# Patient Record
Sex: Female | Born: 1975 | Race: White | Hispanic: No | Marital: Single | Smoking: Never smoker
Health system: Southern US, Community
[De-identification: ages and names within clinical notes are randomized; demographics above are authoritative.]

## PROBLEM LIST (undated history)

## (undated) DIAGNOSIS — H9319 Tinnitus, unspecified ear: Secondary | ICD-10-CM

## (undated) DIAGNOSIS — Z8489 Family history of other specified conditions: Secondary | ICD-10-CM

## (undated) DIAGNOSIS — Z87442 Personal history of urinary calculi: Secondary | ICD-10-CM

## (undated) DIAGNOSIS — J342 Deviated nasal septum: Secondary | ICD-10-CM

## (undated) DIAGNOSIS — J302 Other seasonal allergic rhinitis: Secondary | ICD-10-CM

## (undated) DIAGNOSIS — R42 Dizziness and giddiness: Secondary | ICD-10-CM

## (undated) DIAGNOSIS — R0600 Dyspnea, unspecified: Secondary | ICD-10-CM

## (undated) DIAGNOSIS — M797 Fibromyalgia: Secondary | ICD-10-CM

## (undated) DIAGNOSIS — D649 Anemia, unspecified: Secondary | ICD-10-CM

## (undated) DIAGNOSIS — K219 Gastro-esophageal reflux disease without esophagitis: Secondary | ICD-10-CM

## (undated) DIAGNOSIS — C50912 Malignant neoplasm of unspecified site of left female breast: Secondary | ICD-10-CM

## (undated) HISTORY — DX: Anemia, unspecified: D64.9

## (undated) HISTORY — PX: ADENOIDECTOMY: SUR15

## (undated) MED FILL — KCl 20 MEQ/L (0.15%) in NaCl 0.9% Inj: INTRAVENOUS | Qty: 1000 | Status: AC

---
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
## 1997-10-26 ENCOUNTER — Encounter: Admission: RE | Admit: 1997-10-26 | Discharge: 1997-10-26 | Payer: Self-pay | Admitting: Family Medicine

## 2000-07-04 ENCOUNTER — Encounter: Admission: RE | Admit: 2000-07-04 | Discharge: 2000-07-04 | Payer: Self-pay | Admitting: Family Medicine

## 2001-11-04 ENCOUNTER — Encounter: Admission: RE | Admit: 2001-11-04 | Discharge: 2001-11-04 | Payer: Self-pay | Admitting: *Deleted

## 2001-11-04 ENCOUNTER — Encounter: Admission: RE | Admit: 2001-11-04 | Discharge: 2001-11-04 | Payer: Self-pay | Admitting: Family Medicine

## 2001-11-14 ENCOUNTER — Encounter: Admission: RE | Admit: 2001-11-14 | Discharge: 2001-11-14 | Payer: Self-pay | Admitting: Family Medicine

## 2001-12-13 ENCOUNTER — Encounter: Admission: RE | Admit: 2001-12-13 | Discharge: 2001-12-13 | Payer: Self-pay | Admitting: Family Medicine

## 2002-10-02 ENCOUNTER — Encounter: Payer: Self-pay | Admitting: Obstetrics and Gynecology

## 2002-10-02 ENCOUNTER — Inpatient Hospital Stay (HOSPITAL_COMMUNITY): Admission: AD | Admit: 2002-10-02 | Discharge: 2002-10-02 | Payer: Self-pay | Admitting: Obstetrics and Gynecology

## 2003-02-26 ENCOUNTER — Inpatient Hospital Stay (HOSPITAL_COMMUNITY): Admission: AD | Admit: 2003-02-26 | Discharge: 2003-02-28 | Payer: Self-pay | Admitting: *Deleted

## 2003-03-30 ENCOUNTER — Other Ambulatory Visit: Admission: RE | Admit: 2003-03-30 | Discharge: 2003-03-30 | Payer: Self-pay | Admitting: Obstetrics and Gynecology

## 2003-07-11 HISTORY — PX: TUBAL LIGATION: SHX77

## 2003-10-06 ENCOUNTER — Inpatient Hospital Stay (HOSPITAL_COMMUNITY): Admission: AD | Admit: 2003-10-06 | Discharge: 2003-10-06 | Payer: Self-pay | Admitting: *Deleted

## 2003-11-02 ENCOUNTER — Other Ambulatory Visit: Admission: RE | Admit: 2003-11-02 | Discharge: 2003-11-02 | Payer: Self-pay | Admitting: Obstetrics and Gynecology

## 2003-11-03 ENCOUNTER — Other Ambulatory Visit: Admission: RE | Admit: 2003-11-03 | Discharge: 2003-11-03 | Payer: Self-pay | Admitting: Obstetrics and Gynecology

## 2004-03-14 ENCOUNTER — Inpatient Hospital Stay (HOSPITAL_COMMUNITY): Admission: AD | Admit: 2004-03-14 | Discharge: 2004-03-17 | Payer: Self-pay | Admitting: Obstetrics and Gynecology

## 2004-05-02 ENCOUNTER — Ambulatory Visit (HOSPITAL_COMMUNITY): Admission: RE | Admit: 2004-05-02 | Discharge: 2004-05-02 | Payer: Self-pay | Admitting: Obstetrics and Gynecology

## 2006-01-12 ENCOUNTER — Emergency Department (HOSPITAL_COMMUNITY): Admission: EM | Admit: 2006-01-12 | Discharge: 2006-01-12 | Payer: Self-pay | Admitting: Emergency Medicine

## 2007-12-11 ENCOUNTER — Emergency Department (HOSPITAL_COMMUNITY): Admission: EM | Admit: 2007-12-11 | Discharge: 2007-12-11 | Payer: Self-pay | Admitting: Emergency Medicine

## 2008-12-11 ENCOUNTER — Emergency Department (HOSPITAL_COMMUNITY): Admission: EM | Admit: 2008-12-11 | Discharge: 2008-12-11 | Payer: Self-pay | Admitting: Family Medicine

## 2008-12-11 ENCOUNTER — Emergency Department (HOSPITAL_COMMUNITY): Admission: EM | Admit: 2008-12-11 | Discharge: 2008-12-12 | Payer: Self-pay | Admitting: Emergency Medicine

## 2010-01-03 ENCOUNTER — Emergency Department (HOSPITAL_COMMUNITY): Admission: EM | Admit: 2010-01-03 | Discharge: 2010-01-03 | Payer: Self-pay | Admitting: Family Medicine

## 2010-09-04 ENCOUNTER — Emergency Department (HOSPITAL_COMMUNITY)
Admission: EM | Admit: 2010-09-04 | Discharge: 2010-09-04 | Disposition: A | Discharge status: Home / Self Care | Payer: 59 | Attending: Emergency Medicine | Admitting: Emergency Medicine

## 2010-09-04 DIAGNOSIS — G43909 Migraine, unspecified, not intractable, without status migrainosus: Secondary | ICD-10-CM | POA: Insufficient documentation

## 2010-09-25 LAB — URINALYSIS, MICROSCOPIC ONLY
Glucose, UA: NEGATIVE mg/dL
Protein, ur: >300 mg/dL — AB
pH: 5.5 (ref 5.0–8.0)

## 2010-09-25 LAB — POCT URINALYSIS DIP (DEVICE)
Nitrite: POSITIVE — AB
Protein, ur: >=300 mg/dL — AB
Urobilinogen, UA: 1 mg/dL (ref 0.0–1.0)
pH: 5.5 (ref 5.0–8.0)

## 2010-11-25 NOTE — Op Note (Signed)
NAMEAILY, TZENG             ACCOUNT NO.:  [id number]   MEDICAL RECORD NO.:  [id number]          PATIENT TYPE:  AMB   LOCATION:  SDC                           FACILITY:  [facility]   PHYSICIAN:  Randye Lobo, M.D.   DATE OF BIRTH:  [birth year]   DATE OF PROCEDURE:  05/02/2004  DATE OF DISCHARGE:                                 OPERATIVE REPORT   PREOPERATIVE DIAGNOSES:  1.  Multiparous female.  2.  Desires permanent sterilization.   POSTOPERATIVE DIAGNOSES:  1.  Multiparous female.  2.  Desires permanent sterilization.   PROCEDURE:  Laparoscopic  bilateral tubal ligation with bipolar cautery.   SURGEON:  Randye Lobo, M.D.   ANESTHESIA:  General endotracheal, local with 0.25% Marcaine.   FLUIDS REPLACED:  1500 mL Ringer's lactate.   ESTIMATED BLOOD LOSS:  Minimal.   URINE OUTPUT:  300 mL.   COMPLICATIONS:  None.   INDICATION FOR PROCEDURE:  The patient is a 35 year old gravida 3, para 64,  Caucasian female, status post normal spontaneous vaginal delivery on  March 15, 2004, who throughout her antenatal course and postpartum course  expressed a desire for permanent sterilization.  The patient was counseled  regarding risks, benefits, and alternatives to permanent sterilization, and  she chose to proceed.  The patient was counseled regarding a failure rate of  the procedure of approximately one in 250 to one in 300, which may result in  either an intrauterine or an ectopic pregnancy.  She again chose to proceed.   FINDINGS:  Laparoscopy demonstrated a normal uterus, tubes, and ovaries.  The left ovary was noted to be slightly enlarged compared to the right,  although there was no obvious cyst formation.   SPECIMENS:  None.   PROCEDURE:  The patient was reidentified in the preoperative hold area.  She  did receive Clindamycin intravenously for antibiotic prophylaxis.  In the  operating room, the patient was placed in the supine position and general  endotracheal  anesthesia was induced.  She was then placed in the dorsal  lithotomy position.  The abdomen and the vagina were then sterilely prepped  and the patient was catheterized of urine.  A speculum was placed in the  vagina and a single-tooth tenaculum was placed on the anterior cervical lip,  and this was replaced by the Hulka tenaculum.  The remaining vaginal  instruments were removed.   A 1 cm vertical umbilical incision was created with the scalpel.  The  incision was carried down to the fascia with blunt dissection using an Allis  clamp.  A 10 mm trocar was then inserted directly into the peritoneal cavity  without difficulty.  The laparoscope confirmed proper placement.  A  pneumoperitoneum was achieved with CO2 gas.   The laparoscope was then placed and the patient was placed in the  Trendelenburg position.  The area underneath the trocar insertion site was  examined and there was no evidence of any bleeding in this area.  There was  some bleeding noted in the pelvis, which was consistent with the patient's  current menstruation.  An inspection of  the pelvic organs was performed, and  the findings are as noted above.  In the upper abdomen, the liver and  stomach appeared to be unremarkable.   A 5 mm incision was then created suprapubically with a scalpel.  A 5 mm  trocar was placed under direct visualization of the laparoscope.  The right  fallopian tube was grasped with a Kleppinger forceps and it was followed all  the way to its fimbriated end.  It was then coagulated in an isthmic portion  along a 2 cm contiguous section of fallopian tube.  The same procedure that  was followed on the right fallopian tube was then repeated on the left  fallopian tube after it was grasped and followed all the way to its  fimbriated end.  Again there was good blanching noted at both the fallopian  tube and the mesosalpinx.   The suprapubic trocar was removed under visualization of the laparoscope.   The pneumoperitoneum was released and the laparoscope and the umbilical  trocar were removed simultaneously.  The skin incisions were closed with  subcuticular sutures of 3-0 plain.  Steri-Strips and Benzoin were placed  over the incisions, and the skin was then injected with 0.25% Marcaine  locally.   All of the vaginal instruments and the Foley catheter were removed.  The  patient was cleansed of any remaining Betadine.  There were no complications  to the procedure.  All needle, instrument, and lap counts were correct.   The patient was escorted to the recovery room in stable and awake condition.     Broo   BES/MEDQ  D:  05/02/2004  T:  05/02/2004  Job:  [id number]

## 2011-05-13 ENCOUNTER — Inpatient Hospital Stay (INDEPENDENT_AMBULATORY_CARE_PROVIDER_SITE_OTHER)
Admission: RE | Admit: 2011-05-13 | Discharge: 2011-05-13 | Disposition: A | Discharge status: Home / Self Care | Payer: 59 | Source: Ambulatory Visit | Attending: Emergency Medicine | Admitting: Emergency Medicine

## 2011-05-13 DIAGNOSIS — M25519 Pain in unspecified shoulder: Secondary | ICD-10-CM

## 2011-08-17 ENCOUNTER — Emergency Department (INDEPENDENT_AMBULATORY_CARE_PROVIDER_SITE_OTHER)
Admission: EM | Admit: 2011-08-17 | Discharge: 2011-08-17 | Disposition: A | Discharge status: Home / Self Care | Payer: Self-pay | Source: Home / Self Care | Attending: Family Medicine | Admitting: Family Medicine

## 2011-08-17 ENCOUNTER — Encounter (HOSPITAL_COMMUNITY): Payer: Self-pay | Admitting: *Deleted

## 2011-08-17 DIAGNOSIS — J069 Acute upper respiratory infection, unspecified: Secondary | ICD-10-CM

## 2011-08-17 MED ORDER — GUAIFENESIN-CODEINE 100-10 MG/5ML PO SYRP: ORAL_SOLUTION | ORAL | Status: AC

## 2011-08-17 MED ORDER — AZITHROMYCIN 250 MG PO TABS: 250.0000 mg | ORAL_TABLET | Freq: Every day | ORAL | Status: AC

## 2011-08-17 NOTE — ED Provider Notes (Signed)
History     CSN: [id number]  Arrival date & time 08/17/11  1044   First MD Initiated Contact with Patient 08/17/11 1231      Chief Complaint  Patient presents with  . Cough    (Consider location/radiation/quality/duration/timing/severity/associated sxs/prior treatment) HPI Comments: Cough and congestion x 3 days. Fever yesterday to 104. Cough is dry and causing her chest to hurt. No runny nose. Some sore throat. No ear pain, n/v. Took tylenol yesterday. No fever today.   The history is provided by the patient.    History reviewed. No pertinent past medical history.  History reviewed. No pertinent past surgical history.  Family History  Problem Relation Age of Onset  . Asthma Mother   . Cancer Other     History  Substance Use Topics  . Smoking status: Not on file  . Smokeless tobacco: Not on file  . Alcohol Use:     OB History    Grav Para Term Preterm Abortions TAB SAB Ect Mult Living                  Review of Systems  Constitutional: Positive for fever.  HENT: Positive for congestion and sore throat.   Respiratory: Positive for cough. Negative for wheezing.   Cardiovascular: Negative.   Gastrointestinal: Negative.   Genitourinary: Negative.   Musculoskeletal: Positive for arthralgias.  Skin: Negative.     Allergies  Penicillins  Home Medications   Current Outpatient Rx  Name Route Sig Dispense Refill  . AZITHROMYCIN 250 MG PO TABS Oral Take 1 tablet (250 mg total) by mouth daily. Take first 2 tablets together, then 1 every day until finished. 6 tablet 0  . GUAIFENESIN-CODEINE 100-10 MG/5ML PO SYRP  1-2 tsp po q 6 hrs prn cough 120 mL 0    BP 119/63  Pulse 70  Temp(Src) 98.6 F (37 C) (Oral)  Resp 20  SpO2 99%  LMP 08/17/2011  Physical Exam  Nursing note and vitals reviewed. Constitutional: She appears well-developed and well-nourished. No distress.  HENT:  Head: Normocephalic and atraumatic.       Ears clear, nose as congested, throat  mild erythemic  Neck: Normal range of motion. Neck supple.  Cardiovascular: Normal rate, regular rhythm and normal heart sounds.   Pulmonary/Chest: Effort normal and breath sounds normal. She has no wheezes.       Congested cough  Lymphadenopathy:    She has no cervical adenopathy.  Skin: Skin is warm and dry.    ED Course  Procedures (including critical care time)  Labs Reviewed - No data to display No results found.   1. URI (upper respiratory infection)       MDM          Randa Spike, MD 08/17/11 1324

## 2011-08-17 NOTE — ED Notes (Signed)
Pt  Has  Symptoms  Of  Cough  /  Congested       With  Pain in  Chest  When  She  Coughs     -    The  Cough  For  The  Most  Part is  Non  -  Productive      With  Streaks  Of  Bloody  Mucous         She  Is  Speaking in  Complete  sentances  And  Is  In no  Severe  Distress

## 2011-11-04 ENCOUNTER — Encounter (HOSPITAL_COMMUNITY): Payer: Self-pay | Admitting: Physical Medicine and Rehabilitation

## 2011-11-04 ENCOUNTER — Emergency Department (HOSPITAL_COMMUNITY)
Admission: EM | Admit: 2011-11-04 | Discharge: 2011-11-05 | Disposition: A | Discharge status: Home / Self Care | Payer: 59 | Attending: Emergency Medicine | Admitting: Emergency Medicine

## 2011-11-04 DIAGNOSIS — R1013 Epigastric pain: Secondary | ICD-10-CM | POA: Insufficient documentation

## 2011-11-04 DIAGNOSIS — K219 Gastro-esophageal reflux disease without esophagitis: Secondary | ICD-10-CM | POA: Insufficient documentation

## 2011-11-04 DIAGNOSIS — R1110 Vomiting, unspecified: Secondary | ICD-10-CM

## 2011-11-04 DIAGNOSIS — Z79899 Other long term (current) drug therapy: Secondary | ICD-10-CM | POA: Insufficient documentation

## 2011-11-04 DIAGNOSIS — R112 Nausea with vomiting, unspecified: Secondary | ICD-10-CM | POA: Insufficient documentation

## 2011-11-04 DIAGNOSIS — R12 Heartburn: Secondary | ICD-10-CM | POA: Insufficient documentation

## 2011-11-04 HISTORY — DX: Gastro-esophageal reflux disease without esophagitis: K21.9

## 2011-11-04 LAB — COMPREHENSIVE METABOLIC PANEL
BUN: 8 mg/dL (ref 6–23)
CO2: 24 mEq/L (ref 19–32)
Calcium: 9.6 mg/dL (ref 8.4–10.5)
Chloride: 102 mEq/L (ref 96–112)
Creatinine, Ser: 0.81 mg/dL (ref 0.50–1.10)
GFR calc Af Amer: >90 mL/min (ref >90)
GFR calc non Af Amer: >90 mL/min (ref >90)
Glucose, Bld: 86 mg/dL (ref 70–99)
Total Bilirubin: 0.6 mg/dL (ref 0.3–1.2)

## 2011-11-04 LAB — URINALYSIS, ROUTINE W REFLEX MICROSCOPIC
Bilirubin Urine: NEGATIVE
Glucose, UA: NEGATIVE mg/dL
Hgb urine dipstick: NEGATIVE
Specific Gravity, Urine: 1.02 (ref 1.005–1.030)
Urobilinogen, UA: 0.2 mg/dL (ref 0.0–1.0)

## 2011-11-04 LAB — CBC
HCT: 39.7 % (ref 36.0–46.0)
Hemoglobin: 14.6 g/dL (ref 12.0–15.0)
MCV: 86.5 fL (ref 78.0–100.0)
RDW: 12.2 % (ref 11.5–15.5)
WBC: 6.7 10*3/uL (ref 4.0–10.5)

## 2011-11-04 LAB — DIFFERENTIAL
Basophils Absolute: 0 10*3/uL (ref 0.0–0.1)
Eosinophils Relative: 4 % (ref 0–5)
Lymphocytes Relative: 28 % (ref 12–46)
Lymphs Abs: 1.9 10*3/uL (ref 0.7–4.0)
Monocytes Absolute: 0.4 10*3/uL (ref 0.1–1.0)
Monocytes Relative: 6 % (ref 3–12)

## 2011-11-04 LAB — LIPASE, BLOOD: Lipase: 25 U/L (ref 11–59)

## 2011-11-04 LAB — URINE MICROSCOPIC-ADD ON

## 2011-11-04 MED ORDER — SODIUM CHLORIDE 0.9 % IV BOLUS (SEPSIS)
1000.0000 mL | Freq: Once | INTRAVENOUS | Status: AC
  Administered 2011-11-04: 1000 mL via INTRAVENOUS

## 2011-11-04 MED ORDER — FAMOTIDINE IN NACL 20-0.9 MG/50ML-% IV SOLN
20.0000 mg | Freq: Once | INTRAVENOUS | Status: AC
  Administered 2011-11-04: 20 mg via INTRAVENOUS
  Filled 2011-11-04: qty 50

## 2011-11-04 MED ORDER — ONDANSETRON HCL 4 MG/2ML IJ SOLN
4.0000 mg | Freq: Once | INTRAMUSCULAR | Status: AC
  Administered 2011-11-04: 4 mg via INTRAVENOUS
  Filled 2011-11-04: qty 2

## 2011-11-04 MED ORDER — GI COCKTAIL ~~LOC~~
30.0000 mL | Freq: Once | ORAL | Status: AC
  Administered 2011-11-04: 30 mL via ORAL
  Filled 2011-11-04: qty 30

## 2011-11-04 MED ORDER — ONDANSETRON 4 MG PO TBDP
4.0000 mg | ORAL_TABLET | Freq: Once | ORAL | Status: AC
  Administered 2011-11-04: 4 mg via ORAL
  Filled 2011-11-04: qty 1

## 2011-11-04 NOTE — ED Provider Notes (Signed)
History     CSN: [id number]  Arrival date & time 11/04/11  4098   First MD Initiated Contact with Patient 11/04/11 1904      Chief Complaint  Patient presents with  . Abdominal Pain  . Gastrophageal Reflux    (Consider location/radiation/quality/duration/timing/severity/associated sxs/prior treatment) Patient is a 36 y.o. female presenting with abdominal pain and vomiting. The history is provided by the patient.  Abdominal Pain The primary symptoms of the illness include abdominal pain, nausea and vomiting. The primary symptoms of the illness do not include fatigue, shortness of breath, diarrhea, dysuria or vaginal discharge. The onset of the illness was sudden. The problem has not changed since onset. The abdominal pain began 6 to 12 hours ago. The abdominal pain has been unchanged since its onset. The abdominal pain is located in the epigastric region. The abdominal pain radiates to the RUQ and LLQ. The abdominal pain is relieved by nothing. The abdominal pain is exacerbated by eating and certain positions.  Additional symptoms associated with the illness include heartburn. Symptoms associated with the illness do not include urgency or frequency. Significant associated medical issues include GERD.  Emesis  This is a new problem. The current episode started 6 to 12 hours ago. The problem occurs 5 to 10 times per day. The problem has not changed since onset.The emesis has an appearance of stomach contents. There has been no fever. Associated symptoms include abdominal pain. Pertinent negatives include no cough, no diarrhea and no headaches.    Past Medical History  Diagnosis Date  . GERD (gastroesophageal reflux disease)   . Migraine     History reviewed. No pertinent past surgical history.  Family History  Problem Relation Age of Onset  . Asthma Mother   . Cancer Other     History  Substance Use Topics  . Smoking status: Never Smoker   . Smokeless tobacco: Not on file  .  Alcohol Use: No    OB History    Grav Para Term Preterm Abortions TAB SAB Ect Mult Living                  Review of Systems  Constitutional: Negative for fatigue.  HENT: Negative for neck pain.   Respiratory: Negative for cough and shortness of breath.   Cardiovascular: Negative for chest pain.  Gastrointestinal: Positive for heartburn, nausea, vomiting and abdominal pain. Negative for diarrhea.  Genitourinary: Negative for dysuria, urgency, frequency, vaginal discharge and pelvic pain.  Skin: Negative for rash.  Neurological: Negative for headaches.  All other systems reviewed and are negative.    Allergies  Penicillins  Home Medications   Current Outpatient Rx  Name Route Sig Dispense Refill  . ASPIRIN-ACETAMINOPHEN-CAFFEINE 250-250-65 MG PO TABS Oral Take 2 tablets by mouth daily as needed. For headaches    . OMEPRAZOLE MAGNESIUM 20 MG PO TBEC Oral Take 20 mg by mouth daily.      BP 133/87  Pulse 92  Temp(Src) 98.5 F (36.9 C) (Oral)  Resp 14  SpO2 100%  Physical Exam  Nursing note and vitals reviewed. Constitutional: She is oriented to person, place, and time. She appears well-developed and well-nourished.  HENT:  Head: Normocephalic and atraumatic.  Eyes: EOM are normal.  Neck: Normal range of motion.  Cardiovascular: Normal rate, regular rhythm and normal heart sounds.   Pulmonary/Chest: Effort normal and breath sounds normal. No respiratory distress.  Abdominal: Soft. There is tenderness in the epigastric area. There is no rigidity, no rebound, no  guarding, no CVA tenderness and negative Murphy's sign.  Musculoskeletal: Normal range of motion.  Neurological: She is alert and oriented to person, place, and time.  Skin: Skin is warm and dry.  Psychiatric: She has a normal mood and affect.    ED Course  Procedures (including critical care time)  Labs Reviewed  URINALYSIS, ROUTINE W REFLEX MICROSCOPIC - Abnormal; Notable for the following:    Ketones,  ur 15 (*)    Leukocytes, UA MODERATE (*)    All other components within normal limits  CBC - Abnormal; Notable for the following:    MCHC 36.8 (*)    All other components within normal limits  POCT PREGNANCY, URINE  DIFFERENTIAL  COMPREHENSIVE METABOLIC PANEL  LIPASE, BLOOD  URINE MICROSCOPIC-ADD ON   No results found.   1. Epigastric pain   2. Vomiting       MDM  Patient with extensive history of reflux who presents with epigastric pain and vomiting today. She states she's seen her PCP for this before his recommendations she was started yogurt today when she had onset of epigastric pain and vomiting. Since then she has had inability to tolerate any thing by mouth. She has also had numerous episodes of vomiting that consist only of stomach acid. No fevers. No blood in her emesis.  On evaluation here the patient has epigastric tenderness. She also has some less prominent right upper quadrant tenderness without Murphy sign. We'll plan on screening labs and symptomatic treatment.  Screening labs all benign.The patient had a normal white count. Also completely normal CMP. Lipase within normal limits. Repeat abdominal exam remained benign. She had no complaints of pain. However on repeat PO challenges the patient vomited. Given lack of pain, benign exam, the lack of abdominal distention do not feel as though consistent with obstruction. Will obtain abdominal ultrasound to further evaluate. Leukocytes noted in urine but patient without urinary symptoms.  Care transferred with abdominal ultrasound pending.       Donnamarie Poag, MD 11/05/11 [phone]

## 2011-11-04 NOTE — ED Notes (Signed)
Pt presents to department for evaluation of abdominal pain, vomiting and acid reflux. Onset today. Pt states severe history of GERD. States numerous episodes of vomiting "bile" today. States difficulty holding down food and fluids. 6/10 abdominal "burning" sensation at the time. Also states "I feel like something is stuck in my throat." she is alert and oriented x4. No signs of acute distress at the time.

## 2011-11-05 ENCOUNTER — Emergency Department (HOSPITAL_COMMUNITY): Payer: 59

## 2011-11-05 MED ORDER — GI COCKTAIL ~~LOC~~
30.0000 mL | Freq: Once | ORAL | Status: AC
  Administered 2011-11-05: 30 mL via ORAL
  Filled 2011-11-05: qty 30

## 2011-11-05 NOTE — Discharge Instructions (Signed)
Abdominal (belly) pain can be caused by many things. any cases can be observed and treated at home after initial evaluation in the emergency department. Even though you are being discharged home, abdominal pain can be unpredictable. Therefore, you need a repeated exam if your pain does not resolve, returns, or worsens. Most patients with abdominal pain don't have to be admitted to the hospital or have surgery, but serious problems like appendicitis and gallbladder attacks can start out as nonspecific pain. Many abdominal conditions cannot be diagnosed in one visit, so follow-up evaluations are very important. SEEK IMMEDIATE MEDICAL ATTENTION IF: The pain does not go away or becomes severe, particularly over the next 8-12 hours.  A temperature above 100.65F develops.  Repeated vomiting occurs (multiple episodes).  The pain becomes localized to portions of the abdomen. The right side could possibly be appendicitis. In an adult, the left lower portion of the abdomen could be colitis or diverticulitis.  Blood is being passed in stools or vomit (bright red or black tarry stools).  Return also if you develop chest pain, difficulty breathing, dizziness or fainting, or become confused, poorly responsive.   AS WE DISCUSSED BE SURE TO FOLLOWUP WITH YOUR PRIMARY FOR THE CALCIUM ON YOUR KIDNEYS WE SPOKE ABOUT AND NEED FOR REPEAT BLOOD WORK TO BE DRAWN TO CHECK YOUR CALCIUM AND KIDNEY FUNCTION

## 2011-11-05 NOTE — ED Provider Notes (Signed)
PT IMPROVED WE DISCUSSED US FINDINGS AND NEED FOR F/U ON CALCIUM/RENAL FUNCTION, THOUGH THESE LEVELS ARE NORMAL TODAY PT TAKING PO STABLE FOR D/C BP 124/76  Pulse 80  Temp(Src) 99.5 F (37.5 C) (Oral)  Resp 17  Ht 5\' 8"  (1.727 m)  Wt 178 lb (80.74 kg)  BMI 27.06 kg/m2  SpO2 99%   Joya Gaskins, MD 11/05/11 [phone]

## 2011-11-06 NOTE — ED Provider Notes (Signed)
I saw and evaluated the patient, reviewed the resident's note and I agree with the findings and plan.  abd pain, nausea and vomiting. Care to Dr Bebe Shaggy, pending [place]  Lyanne Co, MD 11/06/11 [phone]

## 2011-11-07 ENCOUNTER — Encounter: Payer: Self-pay | Admitting: Gastroenterology

## 2011-11-20 ENCOUNTER — Encounter: Payer: Self-pay | Admitting: *Deleted

## 2011-11-24 ENCOUNTER — Ambulatory Visit: Payer: 59 | Admitting: Gastroenterology

## 2011-12-07 ENCOUNTER — Encounter: Payer: Self-pay | Admitting: Gastroenterology

## 2011-12-07 ENCOUNTER — Ambulatory Visit (HOSPITAL_COMMUNITY)
Admission: RE | Admit: 2011-12-07 | Discharge: 2011-12-07 | Disposition: A | Discharge status: Home / Self Care | Payer: 59 | Source: Ambulatory Visit | Attending: Gastroenterology | Admitting: Gastroenterology

## 2011-12-07 ENCOUNTER — Other Ambulatory Visit: Payer: Self-pay | Admitting: Gastroenterology

## 2011-12-07 ENCOUNTER — Ambulatory Visit (INDEPENDENT_AMBULATORY_CARE_PROVIDER_SITE_OTHER): Payer: 59 | Admitting: Gastroenterology

## 2011-12-07 DIAGNOSIS — R6889 Other general symptoms and signs: Secondary | ICD-10-CM | POA: Insufficient documentation

## 2011-12-07 DIAGNOSIS — K920 Hematemesis: Secondary | ICD-10-CM

## 2011-12-07 DIAGNOSIS — R634 Abnormal weight loss: Secondary | ICD-10-CM

## 2011-12-07 DIAGNOSIS — K219 Gastro-esophageal reflux disease without esophagitis: Secondary | ICD-10-CM | POA: Insufficient documentation

## 2011-12-07 DIAGNOSIS — R1310 Dysphagia, unspecified: Secondary | ICD-10-CM

## 2011-12-07 DIAGNOSIS — K449 Diaphragmatic hernia without obstruction or gangrene: Secondary | ICD-10-CM | POA: Insufficient documentation

## 2011-12-07 DIAGNOSIS — R1110 Vomiting, unspecified: Secondary | ICD-10-CM | POA: Insufficient documentation

## 2011-12-07 MED ORDER — AMBULATORY NON FORMULARY MEDICATION: Status: DC

## 2011-12-07 NOTE — Progress Notes (Addendum)
History of Present Illness:  This is a pleasant and intelligent 36 year old Caucasian female who has 3 years of progressive dysphagia and painful swallowing for both solids and liquids, with an associated 20 pound weight loss. She apparently had endoscopy 3 years ago at [facility] which was unremarkable, we have requested this report for review. She is normal right ear PPI agents without any improvement, but does have sour regurgitation of both solids and liquids. There is no history of caustic fluid ingestion as a child, tetracycline or other chronic antibiotic use, or other neuromuscular or collagen vascular symptomatology. She specifically denies Raynaud's phenomenon or bladder emptying problems. There is no history of alcohol, cigarette, or NSAID abuse. Family history is noncontributory. She does have a mild chronic iron deficiency anemia associated with menstruation. Currently she is on Prilosec 20 mg a day. She does complain of intermittent minor hematemesis. There is no history of hepatitis, pancreatitis, or known liver disease. She has had a previous tattoo.  I have reviewed this patient's present history, medical and surgical past history, allergies and medications.     ROS: The remainder of the 10 point ROS is negative     Physical Exam:  blood pressure 100/70, pulse 80 and regular, and weight 191 pounds with a BMI of 30.01. General well developed well nourished patient in no acute distress, appearing her stated age Eyes PERRLA, no icterus, fundoscopic exam per opthamologist Skin no lesions noted, examination oral pharyngeal area is entirely normal. Neck supple, no adenopathy, no thyroid enlargement, no tenderness Chest clear to percussion and auscultation Heart no significant murmurs, gallops or rubs noted Abdomen no hepatosplenomegaly masses or tenderness, BS normal.  Extremities no acute joint lesions, edema, phlebitis or evidence of cellulitis. Neurologic patient oriented x 3, cranial  nerves intact, no focal neurologic deficits noted. Psychological mental status normal and normal affect.  Assessment and plan: Her symptomatology seems more consistent with a neuromuscular esophageal disease such as achalasia. I have ordered barium swallow exam and we'll proceed accordingly. I suspect she will need endoscopy and esophageal manometry. In the interim, she is to continue Prilosec with when necessary GI cocktail.  Encounter Diagnosis  Name Primary?  Marland Kitchen Dysphagia Yes

## 2011-12-07 NOTE — Patient Instructions (Signed)
Your Barium Swallow is scheduled for today 12/07/2011 please go to [facility] at 10:15am. Your prescription(s) have been sent to you pharmacy.

## 2011-12-08 ENCOUNTER — Ambulatory Visit (AMBULATORY_SURGERY_CENTER): Payer: 59 | Admitting: Gastroenterology

## 2011-12-08 ENCOUNTER — Other Ambulatory Visit: Payer: Self-pay | Admitting: Gastroenterology

## 2011-12-08 ENCOUNTER — Encounter: Payer: Self-pay | Admitting: Gastroenterology

## 2011-12-08 VITALS — BP 126/85 | HR 71 | Temp 95.8°F | Resp 16 | Ht 67.0 in | Wt 191.0 lb

## 2011-12-08 DIAGNOSIS — R1310 Dysphagia, unspecified: Secondary | ICD-10-CM

## 2011-12-08 DIAGNOSIS — K210 Gastro-esophageal reflux disease with esophagitis: Secondary | ICD-10-CM

## 2011-12-08 DIAGNOSIS — K200 Eosinophilic esophagitis: Secondary | ICD-10-CM

## 2011-12-08 MED ORDER — FLUTICASONE PROPIONATE HFA 110 MCG/ACT IN AERO: INHALATION_SPRAY | RESPIRATORY_TRACT | Status: DC

## 2011-12-08 MED ORDER — SODIUM CHLORIDE 0.9 % IV SOLN: 500.0000 mL | INTRAVENOUS | Status: DC

## 2011-12-08 NOTE — Patient Instructions (Addendum)
YOU HAD AN ENDOSCOPIC PROCEDURE TODAY AT THE [facility]: Refer to the procedure report that was given to you for any specific questions about what was found during the examination.  If the procedure report does not answer your questions, please call your gastroenterologist to clarify.  If you requested that your care partner not be given the details of your procedure findings, then the procedure report has been included in a sealed envelope for you to review at your convenience later.  YOU SHOULD EXPECT: Some feelings of bloating in the abdomen. Passage of more gas than usual.  Walking can help get rid of the air that was put into your GI tract during the procedure and reduce the bloating. If you had a lower endoscopy (such as a colonoscopy or flexible sigmoidoscopy) you may notice spotting of blood in your stool or on the toilet paper. If you underwent a bowel prep for your procedure, then you may not have a normal bowel movement for a few days.  DIET: Your first meal following the procedure should be a light meal and then it is ok to progress to your normal diet.  A half-sandwich or bowl of soup is an example of a good first meal.  Heavy or fried foods are harder to digest and may make you feel nauseous or bloated.  Likewise meals heavy in dairy and vegetables can cause extra gas to form and this can also increase the bloating.  Drink plenty of fluids but you should avoid alcoholic beverages for 24 hours.  ACTIVITY: Your care partner should take you home directly after the procedure.  You should plan to take it easy, moving slowly for the rest of the day.  You can resume normal activity the day after the procedure however you should NOT DRIVE or use heavy machinery for 24 hours (because of the sedation medicines used during the test).    SYMPTOMS TO REPORT IMMEDIATELY: A gastroenterologist can be reached at any hour.  During normal business hours, 8:30 AM to 5:00 PM Monday through Friday,  call [phone].  After hours and on weekends, please call the GI answering service at [phone] who will take a message and have the physician on call contact you.  Following upper endoscopy (EGD)  Vomiting of blood or coffee ground material  New chest pain or pain under the shoulder blades  Painful or persistently difficult swallowing  New shortness of breath  Fever of 100F or higher  Black, tarry-looking stools  FOLLOW UP: If any biopsies were taken you will be contacted by phone or by letter within the next 1-3 weeks.  Call your gastroenterologist if you have not heard about the biopsies in 3 weeks.  Our staff will call the home number listed on your records the next business day following your procedure to check on you and address any questions or concerns that you may have at that time regarding the information given to you following your procedure. This is a courtesy call and so if there is no answer at the home number and we have not heard from you through the emergency physician on call, we will assume that you have returned to your regular daily activities without incident.  SIGNATURES/CONFIDENTIALITY: You and/or your care partner have signed paperwork which will be entered into your electronic medical record.  These signatures attest to the fact that that the information above on your After Visit Summary has been reviewed and is understood.  Full responsibility of   the confidentiality of this discharge information lies with you and/or your care-partner. 

## 2011-12-08 NOTE — Op Note (Signed)
[facility] [address] [place], [place]  [address]  ENDOSCOPY PROCEDURE REPORT  PATIENT:  [name], [name]  MR#:  [id number] BIRTHDATE:  12-25-1975, 36 yrs. old  GENDER:  female  ENDOSCOPIST:  Vania Rea. Jarold Motto, MD, [facility] Referred by:  PROCEDURE DATE:  12/08/2011 PROCEDURE:  EGD with biopsy, 43239, EGD with biopsy for H. 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  MEDICATIONS:   propofol (Diprivan) 300 mg IV TOPICAL ANESTHETIC:  DESCRIPTION OF PROCEDURE:   After the risks and benefits of the procedure were explained, informed consent was obtained.  The LB GIF-H180 [id number] endoscope was introduced through the mouth and advanced to the second portion of the duodenum.  The instrument was slowly withdrawn as the mucosa was fully examined. <<PROCEDUREIMAGES>>  Mild gastritis was found in the body and the antrum of the stomach. EROSIVE GASTRITIS.CLO BX. DONE  The esophagus and gastroesophageal junction were completely normal in appearance. ESOPHAGEAL BIOPSIES DONE.THEN DILATED WITH #52F MALONEY DILATOR.NO HEME OR PAIN  Normal duodenal folds were noted.    Retroflexed views revealed no abnormalities.    The scope was then withdrawn from the patient and the procedure completed.  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  RECOMMENDATIONS: 1) Await biopsy results 2) Clear liquids until, then soft foods rest iof day. Resume prior diet tomorrow. FLOVENT PO WITHOUUT NEBULIZER FOR 6 WEEKS,SEE ME 3 WEEKS.  ______________________________ Vania Rea. Jarold Motto, MD, [facility]  CC:  n. eSIGNED:   Vania Rea. Maleigha Colvard at 12/08/2011 03:50 PM  Sid Falcon, [phone]

## 2011-12-08 NOTE — Progress Notes (Signed)
Patient did not experience any of the following events: a burn prior to discharge; a fall within the facility; wrong site/side/patient/procedure/implant event; or a hospital transfer or hospital admission upon discharge from the facility. (G8907) Patient did not have preoperative order for IV antibiotic SSI prophylaxis. (G8918)  

## 2011-12-11 ENCOUNTER — Telehealth: Payer: Self-pay

## 2011-12-11 NOTE — Telephone Encounter (Signed)
Left message on answering machine. 

## 2011-12-15 ENCOUNTER — Encounter: Payer: Self-pay | Admitting: Gastroenterology

## 2012-07-21 ENCOUNTER — Encounter (HOSPITAL_COMMUNITY): Payer: Self-pay | Admitting: *Deleted

## 2012-07-21 ENCOUNTER — Emergency Department (HOSPITAL_COMMUNITY)
Admission: EM | Admit: 2012-07-21 | Discharge: 2012-07-21 | Discharge status: Against Medical Advice | Payer: Self-pay | Attending: Emergency Medicine | Admitting: Emergency Medicine

## 2012-07-21 ENCOUNTER — Emergency Department (HOSPITAL_COMMUNITY): Payer: Self-pay

## 2012-07-21 DIAGNOSIS — R112 Nausea with vomiting, unspecified: Secondary | ICD-10-CM | POA: Insufficient documentation

## 2012-07-21 DIAGNOSIS — R209 Unspecified disturbances of skin sensation: Secondary | ICD-10-CM | POA: Insufficient documentation

## 2012-07-21 DIAGNOSIS — H53149 Visual discomfort, unspecified: Secondary | ICD-10-CM | POA: Insufficient documentation

## 2012-07-21 DIAGNOSIS — G43909 Migraine, unspecified, not intractable, without status migrainosus: Secondary | ICD-10-CM | POA: Insufficient documentation

## 2012-07-21 MED ORDER — SODIUM CHLORIDE 0.9 % IV SOLN
INTRAVENOUS | Status: DC
  Administered 2012-07-21: 14:00:00 via INTRAVENOUS

## 2012-07-21 MED ORDER — KETOROLAC TROMETHAMINE 30 MG/ML IJ SOLN
30.0000 mg | Freq: Once | INTRAMUSCULAR | Status: AC
  Administered 2012-07-21: 30 mg via INTRAVENOUS
  Filled 2012-07-21: qty 1

## 2012-07-21 MED ORDER — METOCLOPRAMIDE HCL 5 MG/ML IJ SOLN
10.0000 mg | Freq: Once | INTRAMUSCULAR | Status: AC
  Administered 2012-07-21: 10 mg via INTRAVENOUS
  Filled 2012-07-21: qty 2

## 2012-07-21 MED ORDER — DIPHENHYDRAMINE HCL 50 MG/ML IJ SOLN
25.0000 mg | Freq: Once | INTRAMUSCULAR | Status: AC
  Administered 2012-07-21: 25 mg via INTRAVENOUS
  Filled 2012-07-21: qty 1

## 2012-07-21 NOTE — ED Provider Notes (Signed)
Medical screening examination/treatment/procedure(s) were conducted as a shared visit with non-physician practitioner(s) and myself.  I personally evaluated the patient during the encounter  Sudden onset headache that is worse than usual migraines with nausea and photophobia.  Endorses thunderclap. No neuro deficits, no meningismus.  CT neg. Refuses LP.  Understands SAH not ruled out and will leave against medical advice.  Glynn Octave, MD 07/21/12 [phone]

## 2012-07-21 NOTE — ED Notes (Signed)
Reports hx of migraines, started having one today with nausea, vomiting, photosensitivity and tingling sensation to entire face. No distress noted at triage.

## 2012-07-21 NOTE — ED Provider Notes (Signed)
SUBJECTIVE:  [name] [name] is a 37 y.o. female who complains of migraine headache for the past several hours.  The patient states that she has a history of migraine headaches, but that this one came on faster than usual.  The pain has been constant since her symptoms began earlier this afternoon. Description of pain: throbbing pain. Associated symptoms: nausea, and photophobia. Patient has already taken nothing for this headache without relief.  Current Facility-Administered Medications  Medication Dose Route Frequency Provider Last Rate Last Dose  . 0.9 %  sodium chloride infusion   Intravenous Continuous Roxy Horseman, PA-C       Current Outpatient Prescriptions  Medication Sig Dispense Refill  . ibuprofen (ADVIL,MOTRIN) 200 MG tablet Take 400 mg by mouth every 6 (six) hours as needed. For pain.      . fluticasone (FLOVENT HFA) 110 MCG/ACT inhaler Swallow 4 puffs three times a day for 6 weeks, NO SPACER, do not eat or drink 30 mins after  3 Inhaler  3    There are no associated abnormal neurological symptoms such as TIA's, loss of balance, loss of vision or speech, numbness or weakness on review. Past neurological history: negative for stroke, MS, epilepsy, or brain tumor.   Review of Systems  All other systems reviewed and are negative.   Physical Exam  Nursing note and vitals reviewed. Constitutional: She is oriented to person, place, and time and well-developed, well-nourished, and in no distress. No distress.  HENT:  Head: Normocephalic and atraumatic.  Eyes: Conjunctivae normal and EOM are normal. Pupils are equal, round, and reactive to light.  Neck: Normal range of motion. Neck supple.  Cardiovascular: Normal rate.  Exam reveals no gallop and no friction rub.   No murmur heard. Pulmonary/Chest: Effort normal and breath sounds normal. No respiratory distress. She has no wheezes. She has no rales. She exhibits no tenderness.  Abdominal: Soft.  Musculoskeletal: Normal  range of motion. She exhibits no edema and no tenderness.  Neurological: She is alert and oriented to person, place, and time. GCS score is 15.       CN 3-12 intact, sensation and strength intact bilaterally.  Skin: Skin is warm. She is not diaphoretic.  Psychiatric: Mood, memory, affect and judgment normal.   OBJECTIVE:  Patient appears in pain, preferring to lie in a darkened room. Her vitals are normal. Alert and oriented x 3.  Ears and throat normal. Neck fully supple without nodes. Sinuses non tender. Cranial nerves 3-12 are normal.  Fundi are normal with sharp disc margins, no papilledema, hemorrhages or exudates noted. Mental status normal. Cerebellar function normal.   ASSESSMENT:  Migraine headache  PLAN:  Patient feeling much better after having received IV fluids and migraine cocktail.  She states that she is 90% better.  Dr. Manus Gunning has seen this patient personally.  Discussed with the patient that the sudden onset of her headache is somewhat concerning, and that we would not be able to completely rule out a SAH unless we performed a lumbar puncture.  The patient focalizing understanding, but refuses to have the LP.  She will be discharged AMA, but in stable condition.  She is neurovascularly intact at discharge.  She understands and agrees with the plan.  Return precautions have been given.    Roxy Horseman, PA-C 07/21/12 1755

## 2012-07-21 NOTE — ED Notes (Signed)
Pt refused lumbar puncture

## 2012-10-17 ENCOUNTER — Encounter (HOSPITAL_COMMUNITY): Payer: Self-pay | Admitting: Emergency Medicine

## 2012-10-17 ENCOUNTER — Emergency Department (HOSPITAL_COMMUNITY)
Admission: EM | Admit: 2012-10-17 | Discharge: 2012-10-17 | Disposition: A | Discharge status: Home / Self Care | Payer: Self-pay | Attending: Emergency Medicine | Admitting: Emergency Medicine

## 2012-10-17 DIAGNOSIS — Z862 Personal history of diseases of the blood and blood-forming organs and certain disorders involving the immune mechanism: Secondary | ICD-10-CM | POA: Insufficient documentation

## 2012-10-17 DIAGNOSIS — R1110 Vomiting, unspecified: Secondary | ICD-10-CM | POA: Insufficient documentation

## 2012-10-17 DIAGNOSIS — J029 Acute pharyngitis, unspecified: Secondary | ICD-10-CM | POA: Insufficient documentation

## 2012-10-17 DIAGNOSIS — R509 Fever, unspecified: Secondary | ICD-10-CM | POA: Insufficient documentation

## 2012-10-17 DIAGNOSIS — R6889 Other general symptoms and signs: Secondary | ICD-10-CM | POA: Insufficient documentation

## 2012-10-17 DIAGNOSIS — H53149 Visual discomfort, unspecified: Secondary | ICD-10-CM | POA: Insufficient documentation

## 2012-10-17 DIAGNOSIS — J3489 Other specified disorders of nose and nasal sinuses: Secondary | ICD-10-CM | POA: Insufficient documentation

## 2012-10-17 DIAGNOSIS — R112 Nausea with vomiting, unspecified: Secondary | ICD-10-CM | POA: Insufficient documentation

## 2012-10-17 DIAGNOSIS — Z8719 Personal history of other diseases of the digestive system: Secondary | ICD-10-CM | POA: Insufficient documentation

## 2012-10-17 DIAGNOSIS — G43909 Migraine, unspecified, not intractable, without status migrainosus: Secondary | ICD-10-CM | POA: Insufficient documentation

## 2012-10-17 DIAGNOSIS — Z87442 Personal history of urinary calculi: Secondary | ICD-10-CM | POA: Insufficient documentation

## 2012-10-17 MED ORDER — KETOROLAC TROMETHAMINE 30 MG/ML IJ SOLN
30.0000 mg | Freq: Once | INTRAMUSCULAR | Status: AC
  Administered 2012-10-17: 30 mg via INTRAVENOUS
  Filled 2012-10-17: qty 1

## 2012-10-17 MED ORDER — DIPHENHYDRAMINE HCL 25 MG PO CAPS: 25.0000 mg | ORAL_CAPSULE | Freq: Four times a day (QID) | ORAL | Status: DC | PRN

## 2012-10-17 MED ORDER — PROCHLORPERAZINE EDISYLATE 5 MG/ML IJ SOLN
10.0000 mg | Freq: Four times a day (QID) | INTRAMUSCULAR | Status: DC | PRN
  Administered 2012-10-17: 10 mg via INTRAVENOUS
  Filled 2012-10-17: qty 2

## 2012-10-17 MED ORDER — PROCHLORPERAZINE MALEATE 10 MG PO TABS: 10.0000 mg | ORAL_TABLET | Freq: Two times a day (BID) | ORAL | Status: DC | PRN

## 2012-10-17 MED ORDER — DIPHENHYDRAMINE HCL 50 MG/ML IJ SOLN
12.5000 mg | Freq: Once | INTRAMUSCULAR | Status: AC
  Administered 2012-10-17: 12.5 mg via INTRAVENOUS
  Filled 2012-10-17: qty 1

## 2012-10-17 MED ORDER — SODIUM CHLORIDE 0.9 % IV BOLUS (SEPSIS)
1000.0000 mL | Freq: Once | INTRAVENOUS | Status: AC
  Administered 2012-10-17: 1000 mL via INTRAVENOUS

## 2012-10-17 MED ORDER — HALOPERIDOL LACTATE 5 MG/ML IJ SOLN
2.0000 mg | Freq: Once | INTRAMUSCULAR | Status: AC
  Administered 2012-10-17: 2 mg via INTRAVENOUS
  Filled 2012-10-17: qty 1

## 2012-10-17 NOTE — ED Provider Notes (Signed)
History     CSN: [id number]  Arrival date & time 10/17/12  0420   First MD Initiated Contact with Patient 10/17/12 0431      Chief Complaint  Patient presents with  . Migraine    (Consider location/radiation/quality/duration/timing/severity/associated sxs/prior treatment) HPI [name] [name] is a 37 y.o. female there is a history of migraine headaches was seen previously in January for a similar migraine headaches presents with a migraine headache that started today at 5:00 while getting a new cell phone. Patient says she does not have insurance and so can't afford prophylactic or abortive medications. Headache came on gradually, it peaked at about 8:00, she was not doing anything exertional while the headache was increasing, and about 8:00 started vomiting. She has photophobia, phonophobia, nausea and vomiting. She denies any numbness, tingling, dysarthria, difficulties walking or problems with balance. No changes in vision, chest pain, shortness of breath, productive cough, abdominal pain, diarrhea, blood in the vomit, blood in the stool, dysuria or frequency. A recent fevers chills, cough, runny nose sore throat, she has had some congestion in her nose when she lays down at night.   Past Medical History  Diagnosis Date  . GERD (gastroesophageal reflux disease)   . Migraine   . Anemia   . Kidney stones   . Eosinophilic esophagitis 06/10/2010    egd dr Madilyn Fireman    Past Surgical History  Procedure Laterality Date  . Tubal ligation  2005  . Tonsillectomy  1991    Family History  Problem Relation Age of Onset  . Asthma Mother   . Esophageal cancer Maternal Grandfather   . Ovarian cancer Maternal Grandmother   . Diabetes Mother   . Diabetes Maternal Grandmother   . Colon polyps Mother     History  Substance Use Topics  . Smoking status: Never Smoker   . Smokeless tobacco: Never Used  . Alcohol Use: No    OB History   Grav Para Term Preterm Abortions TAB SAB Ect Mult  Living                  Review of Systems At least 10pt or greater review of systems completed and are negative except where specified in the HPI.  Allergies  Hydrocodone and Penicillins  Home Medications  No current outpatient prescriptions on file.  BP 132/80  Pulse 87  Temp(Src) 98.1 F (36.7 C) (Oral)  Resp 20  SpO2 100%  Physical Exam  Nursing notes reviewed.  Electronic medical record reviewed. VITAL SIGNS:   Filed Vitals:   10/17/12 0422  BP: 132/80  Pulse: 87  Temp: 98.1 F (36.7 C)  TempSrc: Oral  Resp: 20  SpO2: 100%   CONSTITUTIONAL: Awake, oriented, appears non-toxic HENT: Atraumatic, normocephalic, oral mucosa pink and moist, airway patent. Nares patent without drainage. External ears normal. EYES: Conjunctiva clear, EOMI, PERRLA NECK: Trachea midline, non-tender, supple CARDIOVASCULAR: Normal heart rate, Normal rhythm, No murmurs, rubs, gallops PULMONARY/CHEST: Clear to auscultation, no rhonchi, wheezes, or rales. Symmetrical breath sounds. Non-tender. ABDOMINAL: Non-distended, soft, non-tender - no rebound or guarding.  BS normal. NEUROLOGIC: Cranial nerves II through XII are normal the. Moving all four extremities, no gross sensory or motor deficits.  EXTREMITIES: No clubbing, cyanosis, or edema SKIN: Warm, Dry, No erythema, No rash  ED Course  Procedures (including critical care time)  Labs Reviewed - No data to display No results found.   1. Migraine headache       MDM  Cleon Dew Helman  is a 36 y.o. female presenting with migraine headache that is reminiscent of prior migraines, it is more intense, however he did progress over 3 hours and is not exertional. Do not think she's got a subarachnoid hemorrhage. She is nontoxic and afebrile, no stiff neck, do not think this is meningitis. We'll treat the patient with migraine cocktail and reassess.  10/17/2012 6:15 AM: Patient feels much better and likes to go home. Headache pain is only  minimal. We'll send patient home with Compazine - side effects explained, instructed to take Benadryl with Compazine to avoid akathisia or dystonia.  I explained the diagnosis and have given explicit precautions to return to the ER including any other new or worsening symptoms. The patient understands and accepts the medical plan as it's been dictated and I have answered their questions. Discharge instructions concerning home care and prescriptions have been given.  The patient is STABLE and is discharged to home in good condition.             Jones Skene, MD 10/17/12 1610

## 2012-10-17 NOTE — ED Notes (Signed)
Pt presents to ED with c/o severe headache with nausea and vomiting, onset at 5pm yesterday; pt states that she has "migraine headache".

## 2013-04-08 ENCOUNTER — Emergency Department (HOSPITAL_COMMUNITY)
Admission: EM | Admit: 2013-04-08 | Discharge: 2013-04-08 | Disposition: A | Discharge status: Home / Self Care | Payer: BC Managed Care – PPO | Attending: Emergency Medicine | Admitting: Emergency Medicine

## 2013-04-08 ENCOUNTER — Encounter (HOSPITAL_COMMUNITY): Payer: Self-pay | Admitting: Emergency Medicine

## 2013-04-08 DIAGNOSIS — Z3202 Encounter for pregnancy test, result negative: Secondary | ICD-10-CM | POA: Insufficient documentation

## 2013-04-08 DIAGNOSIS — Z87442 Personal history of urinary calculi: Secondary | ICD-10-CM | POA: Insufficient documentation

## 2013-04-08 DIAGNOSIS — Z880 Allergy status to penicillin: Secondary | ICD-10-CM | POA: Insufficient documentation

## 2013-04-08 DIAGNOSIS — Z8719 Personal history of other diseases of the digestive system: Secondary | ICD-10-CM | POA: Insufficient documentation

## 2013-04-08 DIAGNOSIS — R112 Nausea with vomiting, unspecified: Secondary | ICD-10-CM | POA: Insufficient documentation

## 2013-04-08 DIAGNOSIS — Z862 Personal history of diseases of the blood and blood-forming organs and certain disorders involving the immune mechanism: Secondary | ICD-10-CM | POA: Insufficient documentation

## 2013-04-08 DIAGNOSIS — G43909 Migraine, unspecified, not intractable, without status migrainosus: Secondary | ICD-10-CM

## 2013-04-08 MED ORDER — KETOROLAC TROMETHAMINE 30 MG/ML IJ SOLN
INTRAMUSCULAR | Status: AC
  Administered 2013-04-08: 30 mg via INTRAVENOUS
  Filled 2013-04-08: qty 1

## 2013-04-08 MED ORDER — KETOROLAC TROMETHAMINE 30 MG/ML IJ SOLN
30.0000 mg | Freq: Once | INTRAMUSCULAR | Status: AC
  Administered 2013-04-08: 30 mg via INTRAVENOUS

## 2013-04-08 MED ORDER — MAGNESIUM SULFATE IN D5W 10-5 MG/ML-% IV SOLN
1.0000 g | Freq: Once | INTRAVENOUS | Status: AC
  Administered 2013-04-08: 1 g via INTRAVENOUS
  Filled 2013-04-08: qty 100

## 2013-04-08 MED ORDER — SODIUM CHLORIDE 0.9 % IV BOLUS (SEPSIS)
1000.0000 mL | Freq: Once | INTRAVENOUS | Status: AC
  Administered 2013-04-08: 1000 mL via INTRAVENOUS

## 2013-04-08 MED ORDER — METOCLOPRAMIDE HCL 5 MG/ML IJ SOLN
5.0000 mg | Freq: Once | INTRAMUSCULAR | Status: AC
  Administered 2013-04-08: 5 mg via INTRAVENOUS

## 2013-04-08 MED ORDER — DIPHENHYDRAMINE HCL 50 MG/ML IJ SOLN
INTRAMUSCULAR | Status: AC
  Filled 2013-04-08: qty 1

## 2013-04-08 MED ORDER — PROCHLORPERAZINE MALEATE 10 MG PO TABS: 10.0000 mg | ORAL_TABLET | Freq: Two times a day (BID) | ORAL | Status: DC | PRN

## 2013-04-08 MED ORDER — DIPHENHYDRAMINE HCL 50 MG/ML IJ SOLN
25.0000 mg | Freq: Once | INTRAMUSCULAR | Status: AC
  Administered 2013-04-08: 25 mg via INTRAVENOUS

## 2013-04-08 MED ORDER — METOCLOPRAMIDE HCL 5 MG/ML IJ SOLN
INTRAMUSCULAR | Status: AC
  Administered 2013-04-08: 5 mg via INTRAVENOUS
  Filled 2013-04-08: qty 2

## 2013-04-08 NOTE — ED Provider Notes (Signed)
CSN: [id number]     Arrival date & time 04/08/13  1658 History   First MD Initiated Contact with Patient 04/08/13 1702     Chief Complaint  Patient presents with  . Migraine   (Consider location/radiation/quality/duration/timing/severity/associated sxs/prior Treatment) HPI  This is a 37 year old female who presents with migraine. She has fairly frequent migraines. She endorses frontal headache, right-sided facial pain, and tingling. Patient states onset of migraine today. She's had vomiting and photosensitivity. She sometimes gets this with her migraines. She denies any focal weakness or numbness. She denies any double vision or blurry vision. She denies any fevers or neck stiffness.  Past Medical History  Diagnosis Date  . GERD (gastroesophageal reflux disease)   . Migraine   . Anemia   . Kidney stones   . Eosinophilic esophagitis 06/10/2010    egd dr Madilyn Fireman   Past Surgical History  Procedure Laterality Date  . Tubal ligation  2005  . Tonsillectomy  1991   Family History  Problem Relation Age of Onset  . Asthma Mother   . Esophageal cancer Maternal Grandfather   . Ovarian cancer Maternal Grandmother   . Diabetes Mother   . Diabetes Maternal Grandmother   . Colon polyps Mother    History  Substance Use Topics  . Smoking status: Never Smoker   . Smokeless tobacco: Never Used  . Alcohol Use: No   OB History   Grav Para Term Preterm Abortions TAB SAB Ect Mult Living                 Review of Systems  Constitutional: Negative for fever.  HENT: Negative for neck pain and neck stiffness.   Respiratory: Negative for cough, chest tightness and shortness of breath.   Cardiovascular: Negative for chest pain.  Gastrointestinal: Positive for nausea and vomiting. Negative for abdominal pain.  Genitourinary: Negative for dysuria.  Musculoskeletal: Negative for back pain.  Skin: Negative for wound.  Neurological: Positive for headaches. Negative for dizziness.   Psychiatric/Behavioral: Negative for confusion.  All other systems reviewed and are negative.    Allergies  Hydrocodone and Penicillins  Home Medications   Current Outpatient Rx  Name  Route  Sig  Dispense  Refill  . prochlorperazine (COMPAZINE) 10 MG tablet   Oral   Take 1 tablet (10 mg total) by mouth 2 (two) times daily as needed.   10 tablet   0    BP 115/71  Pulse 75  Temp(Src) 98.1 F (36.7 C) (Oral)  Resp 14  SpO2 96%  LMP 03/28/2013 Physical Exam  Nursing note and vitals reviewed. Constitutional: She is oriented to person, place, and time. She appears well-developed and well-nourished. No distress.  HENT:  Head: Normocephalic and atraumatic.  Eyes: Pupils are equal, round, and reactive to light.  Neck: Neck supple.  Cardiovascular: Normal rate, regular rhythm and normal heart sounds.   Pulmonary/Chest: Effort normal and breath sounds normal. No respiratory distress. She has no wheezes.  Abdominal: Soft. There is no tenderness.  Neurological: She is alert and oriented to person, place, and time.  Cranial nerves II through XII intact, no dysmetria noted on finger-nose-finger, visual fields intact, equal and symmetric strength  Skin: Skin is warm and dry.  Psychiatric: She has a normal mood and affect.    ED Course  Procedures (including critical care time) Labs Review Labs Reviewed  POCT PREGNANCY, URINE   Imaging Review No results found.  MDM   1. Migraine    This a 37 year old  female who presents with a migraine. She is nontoxic-appearing on exam and her vital signs are within normal limits. She is nonfocal on neurologic exam. Patient was given normal saline bolus, Reglan, and Benadryl. Patient continued to report pain. Patient was subsequently given Toradol and magnesium. Patient had complete resolution of her pain. Patient states that she was given Compazine during her last discharge which helped her for her migraines. Patient will be given a short  course of Compazine. She was encouraged to take Benadryl with the Compazine to prevent side effects.  After history, exam, and medical workup I feel the patient has been appropriately medically screened and is safe for discharge home. Pertinent diagnoses were discussed with the patient. Patient was given return precautions.  Shon Baton, MD 04/09/13 [phone]

## 2013-04-08 NOTE — ED Notes (Signed)
Discharge instructions reviewed with patient and female visitor  Rx x 1 reviewed with patient Follow up instructions reviewed with patient  Patient and female visitor verbalized understanding to DC instructions, Rx and follow up care All questions answered  Patient and female visitor deny further needs or concerns at this time Patient alert and oriented x 4 upon time of DC and in NAD

## 2013-04-08 NOTE — ED Notes (Signed)
Entered patient room to proceed with D/C process Patient and female visitor asking "Did they give me something for the pain to take home?" No Rx with D/C instructions Patient stated "The last time I was here, they gave me something that really helped." Patient states that she does not remember name of medication MD made aware of above

## 2013-04-08 NOTE — ED Notes (Signed)
Pt c/o migraine x 5 hours along with n/v. Has vomited 2 twice.

## 2013-04-08 NOTE — ED Notes (Signed)
Patient noted to be up for D/C Marchelle Folks, ED Tech at bedside to update VS and remove PIV site

## 2013-11-06 ENCOUNTER — Encounter: Payer: Self-pay | Admitting: Gastroenterology

## 2013-11-17 ENCOUNTER — Encounter: Payer: Self-pay | Admitting: Physician Assistant

## 2013-11-17 ENCOUNTER — Telehealth: Payer: Self-pay | Admitting: Gastroenterology

## 2013-11-17 ENCOUNTER — Encounter (HOSPITAL_COMMUNITY)
Admission: RE | Disposition: A | Discharge status: Home / Self Care | Payer: Self-pay | Source: Ambulatory Visit | Attending: Gastroenterology

## 2013-11-17 ENCOUNTER — Ambulatory Visit (INDEPENDENT_AMBULATORY_CARE_PROVIDER_SITE_OTHER): Payer: BC Managed Care – PPO | Admitting: Physician Assistant

## 2013-11-17 ENCOUNTER — Telehealth: Payer: Self-pay

## 2013-11-17 ENCOUNTER — Encounter (HOSPITAL_COMMUNITY): Payer: Self-pay | Admitting: *Deleted

## 2013-11-17 ENCOUNTER — Ambulatory Visit (HOSPITAL_COMMUNITY)
Admission: RE | Admit: 2013-11-17 | Discharge: 2013-11-17 | Disposition: A | Discharge status: Home / Self Care | Payer: BC Managed Care – PPO | Source: Ambulatory Visit | Attending: Gastroenterology | Admitting: Gastroenterology

## 2013-11-17 VITALS — BP 112/76 | HR 68 | Ht 68.0 in | Wt 193.8 lb

## 2013-11-17 DIAGNOSIS — Z880 Allergy status to penicillin: Secondary | ICD-10-CM | POA: Insufficient documentation

## 2013-11-17 DIAGNOSIS — R1310 Dysphagia, unspecified: Secondary | ICD-10-CM

## 2013-11-17 DIAGNOSIS — Z8669 Personal history of other diseases of the nervous system and sense organs: Secondary | ICD-10-CM

## 2013-11-17 DIAGNOSIS — Y929 Unspecified place or not applicable: Secondary | ICD-10-CM | POA: Insufficient documentation

## 2013-11-17 DIAGNOSIS — T18108A Unspecified foreign body in esophagus causing other injury, initial encounter: Secondary | ICD-10-CM | POA: Insufficient documentation

## 2013-11-17 DIAGNOSIS — IMO0002 Reserved for concepts with insufficient information to code with codable children: Secondary | ICD-10-CM | POA: Insufficient documentation

## 2013-11-17 DIAGNOSIS — K209 Esophagitis, unspecified without bleeding: Secondary | ICD-10-CM | POA: Insufficient documentation

## 2013-11-17 DIAGNOSIS — Z885 Allergy status to narcotic agent status: Secondary | ICD-10-CM | POA: Insufficient documentation

## 2013-11-17 DIAGNOSIS — R1314 Dysphagia, pharyngoesophageal phase: Secondary | ICD-10-CM

## 2013-11-17 DIAGNOSIS — Z800 Family history of malignant neoplasm of digestive organs: Secondary | ICD-10-CM | POA: Insufficient documentation

## 2013-11-17 DIAGNOSIS — G43909 Migraine, unspecified, not intractable, without status migrainosus: Secondary | ICD-10-CM | POA: Insufficient documentation

## 2013-11-17 DIAGNOSIS — K219 Gastro-esophageal reflux disease without esophagitis: Secondary | ICD-10-CM

## 2013-11-17 DIAGNOSIS — T18128A Food in esophagus causing other injury, initial encounter: Secondary | ICD-10-CM

## 2013-11-17 DIAGNOSIS — N201 Calculus of ureter: Secondary | ICD-10-CM | POA: Insufficient documentation

## 2013-11-17 DIAGNOSIS — K222 Esophageal obstruction: Secondary | ICD-10-CM

## 2013-11-17 HISTORY — PX: ESOPHAGOGASTRODUODENOSCOPY: SHX5428

## 2013-11-17 SURGERY — EGD (ESOPHAGOGASTRODUODENOSCOPY)
Anesthesia: Moderate Sedation

## 2013-11-17 MED ORDER — MIDAZOLAM HCL 10 MG/2ML IJ SOLN
INTRAMUSCULAR | Status: DC | PRN
  Administered 2013-11-17 (×4): 2 mg via INTRAVENOUS

## 2013-11-17 MED ORDER — SODIUM CHLORIDE 0.9 % IV SOLN
INTRAVENOUS | Status: DC
  Administered 2013-11-17: 15:00:00 via INTRAVENOUS

## 2013-11-17 MED ORDER — FENTANYL CITRATE 0.05 MG/ML IJ SOLN
INTRAMUSCULAR | Status: DC | PRN
  Administered 2013-11-17 (×3): 25 ug via INTRAVENOUS

## 2013-11-17 MED ORDER — MIDAZOLAM HCL 5 MG/ML IJ SOLN
INTRAMUSCULAR | Status: AC
  Filled 2013-11-17: qty 2

## 2013-11-17 MED ORDER — DIPHENHYDRAMINE HCL 50 MG/ML IJ SOLN
INTRAMUSCULAR | Status: AC
  Filled 2013-11-17: qty 1

## 2013-11-17 MED ORDER — FENTANYL CITRATE 0.05 MG/ML IJ SOLN
INTRAMUSCULAR | Status: AC
  Filled 2013-11-17: qty 2

## 2013-11-17 MED ORDER — DEXLANSOPRAZOLE 60 MG PO CPDR: 60.0000 mg | DELAYED_RELEASE_CAPSULE | Freq: Every day | ORAL | Status: DC

## 2013-11-17 NOTE — Telephone Encounter (Signed)
Pt states that over the weekend she was having difficulty swallowing and now it feels like she has some food stuck. States she is "vomiting acid." Pt scheduled to see Nicoletta Ba PA today at 2pm. Pt aware of appt.

## 2013-11-17 NOTE — Discharge Instructions (Signed)

## 2013-11-17 NOTE — Patient Instructions (Signed)
Please go to [facility] Admitting for an Endoscopy now. Dexilant samples given as well as prescription.

## 2013-11-17 NOTE — H&P (View-Only) (Signed)
Subjective:    Patient ID: [name] [name], female    DOB: 03/08/1976, 38 y.o.   MRN: [id number]  HPI  [name] [name]  is a pleasant 38 year old white female previously known to Dr. Sharlett Iles. She has history of GERD and had undergone EGD in May of 2013 for complaints of dysphagia and odynophagia with suspicion for eosinophilic esophagitis. She was found to have an antral gastritis and distal esophagitis normal-appearing esophageal mucosa. She had empiric Maloney dilation done to #50. Biopsies were consistent with a reflux esophagitis and negative for eosinophilic esophagitis. She had some chronically inflamed squamous mucosa. She says she was treated with Dexilant  for a period of time and then eventually switched to omeprazole by her PCP which she says has not been as helpful. She says she has had some symptoms off and on ever since 2013 but has had an increase in sour brash, subxiphoid discomfort and a pressure type feeling under her ribs over the past several weeks. She complains of a burning discomfort in her esophagus and says her esophagus feels irritated. She did just take a Z-Pak, she has not been on any steroids or any NSAIDs. Yesterday he had an acute exacerbation of her symptoms after eating Mothers Day dinner. She says she had eaten salad,pasta  and then had a small piece of steak which she feels became lodged in her esophagus. She says she has had discomfort in that same spot since then and has been unable to eat or drink anything. She says she was up all night uncomfortable and spitting intermittently into a trash can. She is able to swallow her saliva, but says she cannot get any water to go down. He says when she tries to drink something at gurgles back up and she has to spit it out.    Review of Systems  Constitutional: Negative.   HENT: Positive for trouble swallowing.   Eyes: Negative.   Respiratory: Negative.   Cardiovascular: Positive for chest pain.  Gastrointestinal: Negative.     Endocrine: Negative.   Genitourinary: Negative.   Musculoskeletal: Negative.   Skin: Negative.   Allergic/Immunologic: Negative.   Neurological: Negative.   Hematological: Negative.   Psychiatric/Behavioral: Negative.    Outpatient Prescriptions Prior to Visit  Medication Sig Dispense Refill  . prochlorperazine (COMPAZINE) 10 MG tablet Take 1 tablet (10 mg total) by mouth 2 (two) times daily as needed.  10 tablet  0   No facility-administered medications prior to visit.       Allergies  Allergen Reactions  . Hydrocodone Other (See Comments)    Unknown reaction  . Penicillins Rash   Patient Active Problem List   Diagnosis Date Noted  . Hx of migraine headaches 11/17/2013  . Ureterolithiasis 11/17/2013  . GERD (gastroesophageal reflux disease) 11/17/2013   History  Substance Use Topics  . Smoking status: Never Smoker   . Smokeless tobacco: Never Used  . Alcohol Use: No   family history includes Asthma in her mother; Colon polyps in her mother; Diabetes in her maternal grandmother and mother; Esophageal cancer in her maternal grandfather; Ovarian cancer in her maternal grandmother.      Physical Exam And and and and a well-developed white female in no acute distress, blood pressure 112/76 pulse 68 height 5 foot 8 weight 193. HEENT; nontraumatic normocephalic EOMI PERRLA sclera anicteric, oropharynx is clear there is no definite oral thrush, Neck;Supple no JVD, Cardiovascular; regular rate and rhythm with S1-S2 no murmur or gallop, Pulmonary; clear bilaterally, Abdomen;  soft mildly tender in the epigastrium there is no guarding or rebound no palpable mass or hepatosplenomegaly bowel sounds are present, Rectal; exam not done, Extremities; no clubbing cyanosis or edema skin warm and dry, Psych; mood and affect appropriate       Assessment & Plan:  #3  38 year old female with history of chronic GERD and prior history of esophagitis with complaints of dysphagia and   odynophagia refractory to omeprazole now presenting with an acute episode of dysphagia and possible food impaction with steak which was consumed for dinner last evening.  #2 history of migraines #3 ureterolithiasis  Plan; have discussed with Dr. Ardis Hughs who is covering the hospital and patient will undergo EGD later this afternoon per Dr. Ardis Hughs given possibility of food impaction. Have given her Dexilant samples 60 mg by mouth every morning and also sent a prescription for Dexilant . Further plans pending results of her EGD.

## 2013-11-17 NOTE — Telephone Encounter (Signed)
Message copied by Barron Alvine on Mon Nov 17, 2013  4:05 PM ------      Message from: Owens Loffler P      Created: Mon Nov 17, 2013  3:56 PM       She needs EGD at [facility] with balloon dilation in about 2 weeks, going home today from food impaction EGD.  thanks ------

## 2013-11-17 NOTE — Op Note (Signed)
[facility] [place], [id number]   ENDOSCOPY PROCEDURE REPORT  PATIENT: [name], [name]  MR#: [id number] BIRTHDATE: 1975-08-16 , 38  yrs. old GENDER: Female ENDOSCOPIST: Milus Banister, MD PROCEDURE DATE:  11/17/2013 PROCEDURE:  EGD w/ fb removal ASA CLASS:     Class II INDICATIONS:  chronic dysphagia, GERD; ? food impaction since yeterday. MEDICATIONS: Fentanyl 75 mcg IV and Versed 6 mg IV TOPICAL ANESTHETIC: none  DESCRIPTION OF PROCEDURE: After the risks benefits and alternatives of the procedure were thoroughly explained, informed consent was obtained.  The PENTAX GASTOROSCOPE [id number] endoscope was introduced through the mouth and advanced to the second portion of the duodenum. Without limitations.  The instrument was slowly withdrawn as the mucosa was fully examined.    There was a medium sized food bolus lodge at GE junction.  This was gently pushed into the stomach without significant resistence. There was underlying inflamed, friable Schatzki's ring vs focal peptic stricture.  There was minor oozing at the site after bolus removal and so the stricture was not dilated during this exam.  The examination was otherwise normal.  Retroflexed views revealed no abnormalities.     The scope was then withdrawn from the patient and the procedure completed. COMPLICATIONS: There were no complications.  ENDOSCOPIC IMPRESSION: There was a medium sized food bolus lodge at GE junction.  This was gently pushed into the stomach without significant resistence. There was underlying inflamed, friable Schatzki's ring vs focal peptic stricture.  There was minor oozing at the site after bolus removal and so the stricture was not dilated during this exam.  The examination was otherwise normal.  RECOMMENDATIONS: Please start the once daily dexilant that was prescribed in our office today.  My office will contact you about repeating EGD  with dilation after acute inflammation has subsided (about 2-3 weeks). You should chew your food very well, eat slowly and take small bites.  Liquids only for the next 12 hours.   eSigned:  Milus Banister, MD 11/17/2013 3:54 PM

## 2013-11-17 NOTE — Interval H&P Note (Signed)
History and Physical Interval Note:  11/17/2013 3:04 PM  [name] [name]  has presented today for surgery, with the diagnosis of Food impaction of esophagus [935.1]  The various methods of treatment have been discussed with the patient and family. After consideration of risks, benefits and other options for treatment, the patient has consented to  Procedure(s): ESOPHAGOGASTRODUODENOSCOPY (EGD) (N/A) as a surgical intervention .  The patient's history has been reviewed, patient examined, no change in status, stable for surgery.  I have reviewed the patient's chart and labs.  Questions were answered to the patient's satisfaction.     Milus Banister

## 2013-11-17 NOTE — Progress Notes (Signed)
Subjective:    Patient ID: [name] [name], female    DOB: 03/08/1976, 38 y.o.   MRN: [id number]  HPI  [name] [name]  is a pleasant 38 year old white female previously known to Dr. Sharlett Iles. She has history of GERD and had undergone EGD in May of 2013 for complaints of dysphagia and odynophagia with suspicion for eosinophilic esophagitis. She was found to have an antral gastritis and distal esophagitis normal-appearing esophageal mucosa. She had empiric Maloney dilation done to #50. Biopsies were consistent with a reflux esophagitis and negative for eosinophilic esophagitis. She had some chronically inflamed squamous mucosa. She says she was treated with Dexilant  for a period of time and then eventually switched to omeprazole by her PCP which she says has not been as helpful. She says she has had some symptoms off and on ever since 2013 but has had an increase in sour brash, subxiphoid discomfort and a pressure type feeling under her ribs over the past several weeks. She complains of a burning discomfort in her esophagus and says her esophagus feels irritated. She did just take a Z-Pak, she has not been on any steroids or any NSAIDs. Yesterday he had an acute exacerbation of her symptoms after eating Mothers Day dinner. She says she had eaten salad,pasta  and then had a small piece of steak which she feels became lodged in her esophagus. She says she has had discomfort in that same spot since then and has been unable to eat or drink anything. She says she was up all night uncomfortable and spitting intermittently into a trash can. She is able to swallow her saliva, but says she cannot get any water to go down. He says when she tries to drink something at gurgles back up and she has to spit it out.    Review of Systems  Constitutional: Negative.   HENT: Positive for trouble swallowing.   Eyes: Negative.   Respiratory: Negative.   Cardiovascular: Positive for chest pain.  Gastrointestinal: Negative.     Endocrine: Negative.   Genitourinary: Negative.   Musculoskeletal: Negative.   Skin: Negative.   Allergic/Immunologic: Negative.   Neurological: Negative.   Hematological: Negative.   Psychiatric/Behavioral: Negative.    Outpatient Prescriptions Prior to Visit  Medication Sig Dispense Refill  . prochlorperazine (COMPAZINE) 10 MG tablet Take 1 tablet (10 mg total) by mouth 2 (two) times daily as needed.  10 tablet  0   No facility-administered medications prior to visit.       Allergies  Allergen Reactions  . Hydrocodone Other (See Comments)    Unknown reaction  . Penicillins Rash   Patient Active Problem List   Diagnosis Date Noted  . Hx of migraine headaches 11/17/2013  . Ureterolithiasis 11/17/2013  . GERD (gastroesophageal reflux disease) 11/17/2013   History  Substance Use Topics  . Smoking status: Never Smoker   . Smokeless tobacco: Never Used  . Alcohol Use: No   family history includes Asthma in her mother; Colon polyps in her mother; Diabetes in her maternal grandmother and mother; Esophageal cancer in her maternal grandfather; Ovarian cancer in her maternal grandmother.      Physical Exam And and and and a well-developed white female in no acute distress, blood pressure 112/76 pulse 68 height 5 foot 8 weight 193. HEENT; nontraumatic normocephalic EOMI PERRLA sclera anicteric, oropharynx is clear there is no definite oral thrush, Neck;Supple no JVD, Cardiovascular; regular rate and rhythm with S1-S2 no murmur or gallop, Pulmonary; clear bilaterally, Abdomen;  soft mildly tender in the epigastrium there is no guarding or rebound no palpable mass or hepatosplenomegaly bowel sounds are present, Rectal; exam not done, Extremities; no clubbing cyanosis or edema skin warm and dry, Psych; mood and affect appropriate       Assessment & Plan:  #47  38 year old female with history of chronic GERD and prior history of esophagitis with complaints of dysphagia and   odynophagia refractory to omeprazole now presenting with an acute episode of dysphagia and possible food impaction with steak which was consumed for dinner last evening.  #2 history of migraines #3 ureterolithiasis  Plan; have discussed with Dr. Ardis Hughs who is covering the hospital and patient will undergo EGD later this afternoon per Dr. Ardis Hughs given possibility of food impaction. Have given her Dexilant samples 60 mg by mouth every morning and also sent a prescription for Dexilant . Further plans pending results of her EGD.

## 2013-11-17 NOTE — Progress Notes (Signed)
i agree with the above plan 

## 2013-11-18 ENCOUNTER — Encounter (HOSPITAL_COMMUNITY): Payer: Self-pay | Admitting: Gastroenterology

## 2013-11-18 NOTE — Telephone Encounter (Signed)
Left message on machine to call back  

## 2013-11-18 NOTE — Telephone Encounter (Signed)
Pt has been scheduled for ENDO and PRE VISIT she is aware

## 2013-12-02 ENCOUNTER — Ambulatory Visit (AMBULATORY_SURGERY_CENTER): Payer: Self-pay

## 2013-12-02 VITALS — Ht 67.0 in | Wt 193.6 lb

## 2013-12-02 DIAGNOSIS — R1310 Dysphagia, unspecified: Secondary | ICD-10-CM

## 2013-12-02 NOTE — Progress Notes (Signed)
Per pt, no allergies to soy or egg products.Pt not taking any weight loss meds or using  O2 at home. 

## 2013-12-04 ENCOUNTER — Encounter: Payer: Self-pay | Admitting: Gastroenterology

## 2013-12-08 ENCOUNTER — Ambulatory Visit (AMBULATORY_SURGERY_CENTER): Payer: BC Managed Care – PPO | Admitting: Gastroenterology

## 2013-12-08 ENCOUNTER — Encounter: Payer: Self-pay | Admitting: Gastroenterology

## 2013-12-08 VITALS — BP 115/72 | HR 56 | Temp 96.9°F | Resp 21 | Ht 67.0 in | Wt 193.0 lb

## 2013-12-08 DIAGNOSIS — K222 Esophageal obstruction: Secondary | ICD-10-CM

## 2013-12-08 DIAGNOSIS — R1310 Dysphagia, unspecified: Secondary | ICD-10-CM

## 2013-12-08 MED ORDER — OMEPRAZOLE 40 MG PO CPDR: 40.0000 mg | DELAYED_RELEASE_CAPSULE | Freq: Every day | ORAL | Status: DC

## 2013-12-08 MED ORDER — SODIUM CHLORIDE 0.9 % IV SOLN: 500.0000 mL | INTRAVENOUS | Status: DC

## 2013-12-08 NOTE — Op Note (Signed)
[facility]  [address]. [place], [address]   ENDOSCOPY PROCEDURE REPORT  PATIENT: [name] [name], [name] [name]  MR#: [id number] BIRTHDATE: October 09, 1975 , 38  yrs. old GENDER: Female ENDOSCOPIST: Milus Banister, MD PROCEDURE DATE:  12/08/2013 PROCEDURE:  EGD, balloon dilation ASA CLASS:     Class II INDICATIONS:  recurrent dysphagia, food impaction 2-3 weeks ago (previous workup for EE by Dr.  Sharlett Iles was negative). MEDICATIONS: MAC sedation, administered by CRNA and Propofol (Diprivan) 220 TOPICAL ANESTHETIC: none  DESCRIPTION OF PROCEDURE: After the risks benefits and alternatives of the procedure were thoroughly explained, informed consent was obtained.  The LB [id number] endoscope was introduced through the mouth and advanced to the second portion of the duodenum. Without limitations.  The instrument was slowly withdrawn as the mucosa was fully examined   There was a non-inflamed mucosal (Schatzki's) ring at GE junction. The esophagus was otherwise normal.  I used a TTS CRE balloon to dilated the ring, held inflated to 31mm for 60seconds.  There was typical superficial mucosal tear and self limited minor oozing following dilation.  The examination was otherwise normal. Retroflexed views revealed no abnormalities.     The scope was then withdrawn from the patient and the procedure completed. COMPLICATIONS: There were no complications.  ENDOSCOPIC IMPRESSION: There was a non-inflamed mucosal (Schatzki's) ring at GE junction. I used a TTS CRE balloon to dilated the ring upto 16mm..  The examination was otherwise normal.  RECOMMENDATIONS: Your omeprazole prescription was increased.  You will be taking 40mg  pills now.  One pill 20-30 min before BF meal.  Please call Dr. Ardis Hughs' office in 3-4 weeks to report on your swallowing.  You should continue to chew your food well, eat slowly and take small bites.   eSigned:  Milus Banister, MD 12/08/2013 8:37  AM   CC: Augusto Gamble, MD

## 2013-12-08 NOTE — Patient Instructions (Signed)
Nothing by mouth for 1 hour, then clear liquids for 1 hour, then soft diet for rest of today, tomorrow resume regular diet.  SEE HANDOUT. Take omeprazole 40 mg pill 20-30 minutes before breakfast. Call office in 3-4 weeks to report swallowing.  You should chew food well,eat slowly and take small bites. Call us with any questions or concerns. Thank you!!  YOU HAD AN ENDOSCOPIC PROCEDURE TODAY AT [facility]: Refer to the procedure report that was given to you for any specific questions about what was found during the examination.  If the procedure report does not answer your questions, please call your gastroenterologist to clarify.  If you requested that your care partner not be given the details of your procedure findings, then the procedure report has been included in a sealed envelope for you to review at your convenience later.  YOU SHOULD EXPECT: Some feelings of bloating in the abdomen. Passage of more gas than usual.  Walking can help get rid of the air that was put into your GI tract during the procedure and reduce the bloating. If you had a lower endoscopy (such as a colonoscopy or flexible sigmoidoscopy) you may notice spotting of blood in your stool or on the toilet paper. If you underwent a bowel prep for your procedure, then you may not have a normal bowel movement for a few days.  DIET: Dilation Diet, see handout given.  Drink plenty of fluids but you should avoid alcoholic beverages for 24 hours.  ACTIVITY: Your care partner should take you home directly after the procedure.  You should plan to take it easy, moving slowly for the rest of the day.  You can resume normal activity the day after the procedure however you should NOT DRIVE or use heavy machinery for 24 hours (because of the sedation medicines used during the test).    SYMPTOMS TO REPORT IMMEDIATELY: A gastroenterologist can be reached at any hour.  During normal business hours, 8:30 AM to 5:00 PM Monday through  Friday, call [phone].  After hours and on weekends, please call the GI answering service at [phone] who will take a message and have the physician on call contact you.   Following lower endoscopy (colonoscopy or flexible sigmoidoscopy):  Excessive amounts of blood in the stool  Significant tenderness or worsening of abdominal pains  Swelling of the abdomen that is new, acute  Fever of 100F or higher  Following upper endoscopy (EGD)  Vomiting of blood or coffee ground material  New chest pain or pain under the shoulder blades  Painful or persistently difficult swallowing  New shortness of breath  Fever of 100F or higher  Black, tarry-looking stools  FOLLOW UP: If any biopsies were taken you will be contacted by phone or by letter within the next 1-3 weeks.  Call your gastroenterologist if you have not heard about the biopsies in 3 weeks.  Our staff will call the home number listed on your records the next business day following your procedure to check on you and address any questions or concerns that you may have at that time regarding the information given to you following your procedure. This is a courtesy call and so if there is no answer at the home number and we have not heard from you through the emergency physician on call, we will assume that you have returned to your regular daily activities without incident.  SIGNATURES/CONFIDENTIALITY: You and/or your care partner have signed paperwork which will  be entered into your electronic medical record.  These signatures attest to the fact that that the information above on your After Visit Summary has been reviewed and is understood.  Full responsibility of the confidentiality of this discharge information lies with you and/or your care-partner. 

## 2013-12-08 NOTE — Progress Notes (Signed)
Called to room to assist during endoscopic procedure.  Patient ID and intended procedure confirmed with present staff. Received instructions for my participation in the procedure from the performing physician.  

## 2013-12-08 NOTE — Progress Notes (Signed)
Procedure ends, to recovery, report given and VSS. 

## 2013-12-09 ENCOUNTER — Telehealth: Payer: Self-pay | Admitting: *Deleted

## 2013-12-09 NOTE — Telephone Encounter (Signed)
  Follow up Call-  Call back number 12/08/2013 12/08/2011  Post procedure Call Back phone  # [phone]  Permission to leave phone message Yes Yes     Patient questions:  Do you have a fever, pain , or abdominal swelling? no Pain Score  0 *  Have you tolerated food without any problems? yes  Have you been able to return to your normal activities? yes  Do you have any questions about your discharge instructions: Diet   no Medications  no Follow up visit  no  Do you have questions or concerns about your Care? no  Actions: * If pain score is 4 or above: No action needed, pain <4.

## 2014-01-01 ENCOUNTER — Ambulatory Visit: Payer: 59 | Admitting: Gastroenterology

## 2014-11-04 ENCOUNTER — Emergency Department (HOSPITAL_COMMUNITY): Payer: BLUE CROSS/BLUE SHIELD

## 2014-11-04 ENCOUNTER — Emergency Department (HOSPITAL_COMMUNITY)
Admission: EM | Admit: 2014-11-04 | Discharge: 2014-11-04 | Disposition: A | Discharge status: Home / Self Care | Payer: BLUE CROSS/BLUE SHIELD | Attending: Emergency Medicine | Admitting: Emergency Medicine

## 2014-11-04 ENCOUNTER — Encounter (HOSPITAL_COMMUNITY): Payer: Self-pay | Admitting: Family Medicine

## 2014-11-04 DIAGNOSIS — K449 Diaphragmatic hernia without obstruction or gangrene: Secondary | ICD-10-CM

## 2014-11-04 DIAGNOSIS — G43909 Migraine, unspecified, not intractable, without status migrainosus: Secondary | ICD-10-CM | POA: Diagnosis not present

## 2014-11-04 DIAGNOSIS — Z3202 Encounter for pregnancy test, result negative: Secondary | ICD-10-CM | POA: Diagnosis not present

## 2014-11-04 DIAGNOSIS — R1012 Left upper quadrant pain: Secondary | ICD-10-CM | POA: Diagnosis present

## 2014-11-04 DIAGNOSIS — Z862 Personal history of diseases of the blood and blood-forming organs and certain disorders involving the immune mechanism: Secondary | ICD-10-CM | POA: Insufficient documentation

## 2014-11-04 DIAGNOSIS — K219 Gastro-esophageal reflux disease without esophagitis: Secondary | ICD-10-CM | POA: Insufficient documentation

## 2014-11-04 DIAGNOSIS — Z87442 Personal history of urinary calculi: Secondary | ICD-10-CM | POA: Insufficient documentation

## 2014-11-04 DIAGNOSIS — Z79899 Other long term (current) drug therapy: Secondary | ICD-10-CM | POA: Insufficient documentation

## 2014-11-04 DIAGNOSIS — Z880 Allergy status to penicillin: Secondary | ICD-10-CM | POA: Diagnosis not present

## 2014-11-04 LAB — COMPREHENSIVE METABOLIC PANEL
ALBUMIN: 3.8 g/dL (ref 3.5–5.2)
ALT: 19 U/L (ref 0–35)
AST: 15 U/L (ref 0–37)
Alkaline Phosphatase: 66 U/L (ref 39–117)
Anion gap: 5 (ref 5–15)
BILIRUBIN TOTAL: 0.7 mg/dL (ref 0.3–1.2)
BUN: 8 mg/dL (ref 6–23)
CO2: 26 mmol/L (ref 19–32)
Calcium: 9 mg/dL (ref 8.4–10.5)
Chloride: 106 mmol/L (ref 96–112)
Creatinine, Ser: 0.81 mg/dL (ref 0.50–1.10)
GFR calc Af Amer: >90 mL/min (ref >90)
GFR calc non Af Amer: >90 mL/min (ref >90)
Glucose, Bld: 98 mg/dL (ref 70–99)
Potassium: 3.8 mmol/L (ref 3.5–5.1)
SODIUM: 137 mmol/L (ref 135–145)
Total Protein: 6.5 g/dL (ref 6.0–8.3)

## 2014-11-04 LAB — D-DIMER, QUANTITATIVE: D-Dimer, Quant: <0.27 ug/mL-FEU (ref 0.00–0.48)

## 2014-11-04 LAB — CBC WITH DIFFERENTIAL/PLATELET
BASOS PCT: 0 % (ref 0–1)
Basophils Absolute: 0 10*3/uL (ref 0.0–0.1)
Eosinophils Absolute: 0.2 10*3/uL (ref 0.0–0.7)
Eosinophils Relative: 4 % (ref 0–5)
HEMATOCRIT: 39.1 % (ref 36.0–46.0)
HEMOGLOBIN: 13.9 g/dL (ref 12.0–15.0)
LYMPHS ABS: 1.7 10*3/uL (ref 0.7–4.0)
LYMPHS PCT: 32 % (ref 12–46)
MCH: 31.4 pg (ref 26.0–34.0)
MCHC: 35.5 g/dL (ref 30.0–36.0)
MCV: 88.3 fL (ref 78.0–100.0)
MONO ABS: 0.4 10*3/uL (ref 0.1–1.0)
MONOS PCT: 7 % (ref 3–12)
NEUTROS ABS: 3.2 10*3/uL (ref 1.7–7.7)
NEUTROS PCT: 57 % (ref 43–77)
Platelets: 195 10*3/uL (ref 150–400)
RBC: 4.43 MIL/uL (ref 3.87–5.11)
RDW: 12.7 % (ref 11.5–15.5)
WBC: 5.5 10*3/uL (ref 4.0–10.5)

## 2014-11-04 LAB — URINALYSIS, ROUTINE W REFLEX MICROSCOPIC
Bilirubin Urine: NEGATIVE
Glucose, UA: NEGATIVE mg/dL
Hgb urine dipstick: NEGATIVE
Ketones, ur: NEGATIVE mg/dL
Nitrite: NEGATIVE
Protein, ur: NEGATIVE mg/dL
Specific Gravity, Urine: 1.022 (ref 1.005–1.030)
Urobilinogen, UA: 1 mg/dL (ref 0.0–1.0)
pH: 7.5 (ref 5.0–8.0)

## 2014-11-04 LAB — URINE MICROSCOPIC-ADD ON

## 2014-11-04 LAB — LIPASE, BLOOD: Lipase: 23 U/L (ref 11–59)

## 2014-11-04 LAB — PREGNANCY, URINE: Preg Test, Ur: NEGATIVE

## 2014-11-04 LAB — POC URINE PREG, ED: Preg Test, Ur: NEGATIVE

## 2014-11-04 MED ORDER — SODIUM CHLORIDE 0.9 % IV BOLUS (SEPSIS)
1000.0000 mL | Freq: Once | INTRAVENOUS | Status: AC
  Administered 2014-11-04: 1000 mL via INTRAVENOUS

## 2014-11-04 MED ORDER — TRAMADOL HCL 50 MG PO TABS: 50.0000 mg | ORAL_TABLET | Freq: Four times a day (QID) | ORAL | Status: DC | PRN

## 2014-11-04 MED ORDER — FAMOTIDINE 20 MG PO TABS: 20.0000 mg | ORAL_TABLET | Freq: Two times a day (BID) | ORAL | Status: DC

## 2014-11-04 MED ORDER — IOHEXOL 300 MG/ML  SOLN
100.0000 mL | Freq: Once | INTRAMUSCULAR | Status: AC | PRN
Start: 2014-11-04 — End: 2014-11-04
  Administered 2014-11-04: 100 mL via INTRAVENOUS

## 2014-11-04 MED ORDER — SUCRALFATE 1 G PO TABS: 1.0000 g | ORAL_TABLET | Freq: Three times a day (TID) | ORAL | Status: DC

## 2014-11-04 MED ORDER — IOHEXOL 300 MG/ML  SOLN
25.0000 mL | Freq: Once | INTRAMUSCULAR | Status: AC | PRN
  Administered 2014-11-04: 25 mL via ORAL

## 2014-11-04 NOTE — ED Notes (Signed)
Pt here for LUQ pain radiating into back and shoulder. sts that is started yesterday. sts she has been unable to really eat and has full feeling with nausea.

## 2014-11-04 NOTE — ED Notes (Signed)
Pt ambulatory, stable, states understanding of discharge instructions

## 2014-11-04 NOTE — ED Provider Notes (Signed)
CSN: [id number]     Arrival date & time 11/04/14  2409 History   First MD Initiated Contact with Patient 11/04/14 1001     Chief Complaint  Patient presents with  . Abdominal Pain     (Consider location/radiation/quality/duration/timing/severity/associated sxs/prior Treatment) HPI Patient presents to the emergency department with left upper abdominal pain that radiates to her shoulder on the left.  The patient states that she does not have any vomiting, but does have nausea and she states that she is unable to eat because she has a full feeling in her abdomen.  The patient states that she has not had any chest pain, weakness, dizziness, headache, blurred vision, back pain, neck pain, fever, dysuria, incontinence, diarrhea, rash, or syncope.  Patient states nothing seems make her condition better, but palpation makes the pain worse Past Medical History  Diagnosis Date  . GERD (gastroesophageal reflux disease)   . Migraine   . Anemia   . Kidney stones    Past Surgical History  Procedure Laterality Date  . Tubal ligation  2005  . Tonsillectomy  1991  . Esophagogastroduodenoscopy N/A 11/17/2013    Procedure: ESOPHAGOGASTRODUODENOSCOPY (EGD);  Surgeon: Milus Banister, MD;  Location: [facility];  Service: Endoscopy;  Laterality: N/A;   Family History  Problem Relation Age of Onset  . Asthma Mother   . Diabetes Mother   . Colon polyps Mother   . Esophageal cancer Maternal Grandfather   . Ovarian cancer Maternal Grandmother   . Diabetes Maternal Grandmother    History  Substance Use Topics  . Smoking status: Never Smoker   . Smokeless tobacco: Never Used  . Alcohol Use: No   OB History    No data available     Review of Systems  All other systems negative except as documented in the HPI. All pertinent positives and negatives as reviewed in the HPI.  Allergies  Hydrocodone and Penicillins  Home Medications   Prior to Admission medications   Medication Sig Start Date  End Date Taking? Authorizing Provider  butalbital-acetaminophen-caffeine (FIORICET WITH CODEINE) 50-325-40-30 MG per capsule Take 1 capsule by mouth every 4 (four) hours as needed for headache.    Historical Provider, MD  butalbital-acetaminophen-caffeine (FIORICET, ESGIC) [phone] MG per tablet  11/04/13   Historical Provider, MD  dexlansoprazole (DEXILANT) 60 MG capsule Take 1 capsule (60 mg total) by mouth daily. 11/17/13   Milus Banister, MD  omeprazole (PRILOSEC) 40 MG capsule Take 1 capsule (40 mg total) by mouth daily. 12/08/13   Milus Banister, MD  tizanidine (ZANAFLEX) 2 MG capsule Take 2 mg by mouth daily.    Historical Provider, MD  topiramate (TOPAMAX) 50 MG tablet Take 50 mg by mouth daily.    Historical Provider, MD   BP 98/65 mmHg  Pulse 70  Temp(Src) 98.1 F (36.7 C)  Resp 19  SpO2 100%  LMP 10/18/2014 Physical Exam  Constitutional: She is oriented to person, place, and time. She appears well-developed and well-nourished. No distress.  HENT:  Head: Normocephalic and atraumatic.  Mouth/Throat: Oropharynx is clear and moist.  Eyes: Pupils are equal, round, and reactive to light.  Neck: Normal range of motion. Neck supple.  Cardiovascular: Normal rate and normal heart sounds.  Exam reveals no gallop and no friction rub.   No murmur heard. Pulmonary/Chest: Effort normal and breath sounds normal.  Abdominal: Soft. Normal appearance and bowel sounds are normal. She exhibits no distension. There is tenderness. There is no rebound and no  guarding.    Neurological: She is alert and oriented to person, place, and time. She exhibits normal muscle tone. Coordination normal.  Skin: Skin is warm and dry.  Psychiatric: She has a normal mood and affect. Her behavior is normal.  Nursing note and vitals reviewed.   ED Course  Procedures (including critical care time) Labs Review Labs Reviewed  URINALYSIS, ROUTINE W REFLEX MICROSCOPIC - Abnormal; Notable for the following:     Color, Urine AMBER (*)    Leukocytes, UA MODERATE (*)    All other components within normal limits  URINE MICROSCOPIC-ADD ON - Abnormal; Notable for the following:    Squamous Epithelial / LPF MANY (*)    All other components within normal limits  CBC WITH DIFFERENTIAL/PLATELET  COMPREHENSIVE METABOLIC PANEL  LIPASE, BLOOD  D-DIMER, QUANTITATIVE  PREGNANCY, URINE  POC URINE PREG, ED    Imaging Review Ct Abdomen Pelvis W Contrast  11/04/2014   CLINICAL DATA:  Left upper quadrant discomfort for 2 days.  Nausea.  EXAM: CT ABDOMEN AND PELVIS WITH CONTRAST  TECHNIQUE: Multidetector CT imaging of the abdomen and pelvis was performed using the standard protocol following bolus administration of intravenous contrast. Oral contrast was also administered.  CONTRAST:  173mL OMNIPAQUE IOHEXOL 300 MG/ML  SOLN  COMPARISON:  None.  FINDINGS: There is slight scarring in the posterior lung bases along the pleural surfaces bilaterally. No lung base edema or consolidation. There is a small hiatal hernia.  Liver is prominent, measuring 17.3 cm in length. There is a 7 mm cyst in the lateral segment of the left lobe of the liver. No other focal liver lesion is identified. The gallbladder wall is not thickened. There is no biliary duct dilatation.  Spleen, pancreas, and adrenals appear normal.  There is a 9 x 8 mm cyst in the anterior mid right kidney. There is a 4 x 3 mm calculus in the mid to lower pole left kidney. There is no other calculus on either side. There is no hydronephrosis on either side. There is no ureteral calculus on either side.  In the pelvis, the urinary bladder is midline with normal wall thickness. There is a cyst arising from the left ovary measuring 5.6 x 4.8 cm. There is no other pelvic mass. There is no free pelvic fluid or pelvic fluid collection. The contour of the endometrium raises question of a subseptate type uterus.  Appendix appears normal.  There is no bowel obstruction. No free air or  portal venous air. There is no appreciable ascites, adenopathy, or abscess in the abdomen or pelvis. There is no abdominal aortic aneurysm. There are no blastic or lytic bone lesions.  IMPRESSION: 4 x 3 mm calculus in the lower pole left kidney without hydronephrosis. No ureteral calculi on either side.  5.6 x 4.8 cm left ovarian cyst.  Question subseptate uterus.  Prominent liver.  Small liver cyst.  No other focal liver lesions.  Small hiatal hernia.  No bowel obstruction.  No abscess.  Appendix appears normal.   Electronically Signed   By: Lowella Grip III M.D.   On: 11/04/2014 13:56    Patient is advised she has a small hiatal hernia and that this could cause some discomfort that could radiate pain to her shoulder.  I advised she will need follow-up on this.  Told to return here as needed.  He should degrees to the plan and all questions were answered.  She has been stable here in the emergency department  MDM   Final diagnoses:  None        Dalia Heading, PA-C 11/04/14 1529  Pattricia Boss, MD 11/04/14 [phone]

## 2014-11-04 NOTE — Discharge Instructions (Signed)
Return here as needed. Follow up with the GI doctor provided.

## 2015-01-21 ENCOUNTER — Other Ambulatory Visit: Payer: Self-pay | Admitting: Specialist

## 2015-01-21 DIAGNOSIS — R51 Headache: Principal | ICD-10-CM

## 2015-01-21 DIAGNOSIS — R519 Headache, unspecified: Secondary | ICD-10-CM

## 2015-01-26 ENCOUNTER — Ambulatory Visit
Admission: RE | Admit: 2015-01-26 | Discharge: 2015-01-26 | Disposition: A | Discharge status: Home / Self Care | Payer: BLUE CROSS/BLUE SHIELD | Source: Ambulatory Visit | Attending: Specialist | Admitting: Specialist

## 2015-01-26 DIAGNOSIS — R51 Headache: Principal | ICD-10-CM

## 2015-01-26 DIAGNOSIS — R519 Headache, unspecified: Secondary | ICD-10-CM

## 2015-06-03 ENCOUNTER — Emergency Department (HOSPITAL_BASED_OUTPATIENT_CLINIC_OR_DEPARTMENT_OTHER)
Admission: EM | Admit: 2015-06-03 | Discharge: 2015-06-03 | Disposition: A | Discharge status: Home / Self Care | Payer: No Typology Code available for payment source | Attending: Emergency Medicine | Admitting: Emergency Medicine

## 2015-06-03 ENCOUNTER — Encounter (HOSPITAL_BASED_OUTPATIENT_CLINIC_OR_DEPARTMENT_OTHER): Payer: Self-pay | Admitting: *Deleted

## 2015-06-03 DIAGNOSIS — Z880 Allergy status to penicillin: Secondary | ICD-10-CM | POA: Insufficient documentation

## 2015-06-03 DIAGNOSIS — Y998 Other external cause status: Secondary | ICD-10-CM | POA: Insufficient documentation

## 2015-06-03 DIAGNOSIS — Y9289 Other specified places as the place of occurrence of the external cause: Secondary | ICD-10-CM | POA: Insufficient documentation

## 2015-06-03 DIAGNOSIS — T407X1A Poisoning by cannabis (derivatives), accidental (unintentional), initial encounter: Secondary | ICD-10-CM | POA: Insufficient documentation

## 2015-06-03 DIAGNOSIS — K219 Gastro-esophageal reflux disease without esophagitis: Secondary | ICD-10-CM | POA: Insufficient documentation

## 2015-06-03 DIAGNOSIS — Y9389 Activity, other specified: Secondary | ICD-10-CM | POA: Insufficient documentation

## 2015-06-03 DIAGNOSIS — F419 Anxiety disorder, unspecified: Secondary | ICD-10-CM | POA: Insufficient documentation

## 2015-06-03 DIAGNOSIS — Z87442 Personal history of urinary calculi: Secondary | ICD-10-CM | POA: Insufficient documentation

## 2015-06-03 DIAGNOSIS — G43909 Migraine, unspecified, not intractable, without status migrainosus: Secondary | ICD-10-CM | POA: Insufficient documentation

## 2015-06-03 DIAGNOSIS — Z79899 Other long term (current) drug therapy: Secondary | ICD-10-CM | POA: Insufficient documentation

## 2015-06-03 DIAGNOSIS — Z862 Personal history of diseases of the blood and blood-forming organs and certain disorders involving the immune mechanism: Secondary | ICD-10-CM | POA: Insufficient documentation

## 2015-06-03 DIAGNOSIS — T50901A Poisoning by unspecified drugs, medicaments and biological substances, accidental (unintentional), initial encounter: Secondary | ICD-10-CM

## 2015-06-03 DIAGNOSIS — R000 Tachycardia, unspecified: Secondary | ICD-10-CM | POA: Insufficient documentation

## 2015-06-03 MED ORDER — ONDANSETRON HCL 8 MG PO TABS
4.0000 mg | ORAL_TABLET | Freq: Once | ORAL | Status: AC
  Administered 2015-06-03: 4 mg via ORAL
  Filled 2015-06-03: qty 1

## 2015-06-03 MED ORDER — LORAZEPAM 1 MG PO TABS
1.0000 mg | ORAL_TABLET | Freq: Once | ORAL | Status: AC
  Administered 2015-06-03: 1 mg via ORAL
  Filled 2015-06-03: qty 1

## 2015-06-03 NOTE — ED Provider Notes (Signed)
CSN: [id number]     Arrival date & time 06/03/15  1954 History   By signing my name below, I, [name] [name], attest that this documentation has been prepared under the direction and in the presence of [name] Pew, MD. Electronically Signed: Julien [name], ED Scribe. 06/03/2015. 8:33 PM.     Chief Complaint  Patient presents with  . Ingestion      The history is provided by the patient. No language interpreter was used.   HPI Comments: [name] [name] is a 39 y.o. female who presents to the Emergency Department complaining of a constant, gradual worsening ingestion of a substance onset two hours ago. Pt has had associated vomiting, increased anxiety, chest pain and difficulty breathing. Pt states she took one bite of a strong weed brownie and has felt unwell since. Pt states her legs feel like wet spaghetti noodles. Pt has not used marijuana before. Pt is a non smoker.  Past Medical History  Diagnosis Date  . GERD (gastroesophageal reflux disease)   . Migraine   . Anemia   . Kidney stones    Past Surgical History  Procedure Laterality Date  . Tubal ligation  2005  . Tonsillectomy  1991  . Esophagogastroduodenoscopy N/A 11/17/2013    Procedure: ESOPHAGOGASTRODUODENOSCOPY (EGD);  Surgeon: Milus Banister, MD;  Location: [place];  Service: Endoscopy;  Laterality: N/A;   Family History  Problem Relation Age of Onset  . Asthma Mother   . Diabetes Mother   . Colon polyps Mother   . Esophageal cancer Maternal Grandfather   . Ovarian cancer Maternal Grandmother   . Diabetes Maternal Grandmother    Social History  Substance Use Topics  . Smoking status: Never Smoker   . Smokeless tobacco: Never Used  . Alcohol Use: No   OB History    No data available     Review of Systems  Respiratory: Positive for shortness of breath.   Cardiovascular: Positive for chest pain.  Gastrointestinal: Positive for vomiting.  Psychiatric/Behavioral: The patient is nervous/anxious.   All  other systems reviewed and are negative.     Allergies  Hydrocodone and Penicillins  Home Medications   Prior to Admission medications   Medication Sig Start Date End Date Taking? Authorizing Provider  butalbital-acetaminophen-caffeine (FIORICET WITH CODEINE) 50-325-40-30 MG per capsule Take 1 capsule by mouth every 4 (four) hours as needed for headache.    Historical Provider, MD  butalbital-acetaminophen-caffeine (FIORICET, ESGIC) [phone] MG per tablet  11/04/13   Historical Provider, MD  dexlansoprazole (DEXILANT) 60 MG capsule Take 1 capsule (60 mg total) by mouth daily. 11/17/13   Milus Banister, MD  famotidine (PEPCID) 20 MG tablet Take 1 tablet (20 mg total) by mouth 2 (two) times daily. 11/04/14   Dalia Heading, PA-C  omeprazole (PRILOSEC) 40 MG capsule Take 1 capsule (40 mg total) by mouth daily. 12/08/13   Milus Banister, MD  sucralfate (CARAFATE) 1 G tablet Take 1 tablet (1 g total) by mouth 4 (four) times daily -  with meals and at bedtime. 11/04/14   Dalia Heading, PA-C  tizanidine (ZANAFLEX) 2 MG capsule Take 2 mg by mouth daily.    Historical Provider, MD  topiramate (TOPAMAX) 50 MG tablet Take 50 mg by mouth daily.    Historical Provider, MD  traMADol (ULTRAM) 50 MG tablet Take 1 tablet (50 mg total) by mouth every 6 (six) hours as needed. 11/04/14   Dalia Heading, PA-C   Triage vitals: BP 139/92 mmHg  Pulse 101  Temp(Src) 98.1 F (36.7 C) (Oral)  Resp 16  Ht 5\' 7"  (1.702 m)  Wt 192 lb (87.091 kg)  BMI 30.06 kg/m2  SpO2 100%  LMP 05/27/2015 Physical Exam  Constitutional: She is oriented to person, place, and time. She appears well-developed and well-nourished. No distress.  HENT:  Head: Normocephalic and atraumatic.  Eyes: EOM are normal.  Neck: Normal range of motion.  Cardiovascular: Normal rate, regular rhythm and normal heart sounds.   Tachycardic in triage  Pulmonary/Chest: Effort normal and breath sounds normal.  Abdominal: Soft. She exhibits  no distension. There is no tenderness.  Musculoskeletal: Normal range of motion.  Neurological: She is alert and oriented to person, place, and time.  Skin: Skin is warm and dry.  Psychiatric: She has a normal mood and affect. Judgment normal.  Nursing note and vitals reviewed.   ED Course  Procedures  DIAGNOSTIC STUDIES: Oxygen Saturation is 100% on RA, normal by my interpretation.  COORDINATION OF CARE:  8:32 PM Discussed treatment plan with pt at bedside and pt agreed to plan.  Labs Review Labs Reviewed - No data to display  Imaging Review No results found. I have personally reviewed and evaluated these images and lab results as part of my medical decision-making.   EKG Interpretation   Date/Time:  Thursday June 03 2015 20:41:49 EST Ventricular Rate:  81 PR Interval:  148 QRS Duration: 100 QT Interval:  398 QTC Calculation: 462 R Axis:   78 Text Interpretation:  Normal sinus rhythm Normal ECG ED PHYSICIAN  INTERPRETATION AVAILABLE IN [facility] Confirmed by TEST, Record  ([id number]) on 06/04/2015 7:20:15 AM Also confirmed by [facility] MD, Corene Cornea  [phone])  on 06/04/2015 3:21:52 PM      MDM   Final diagnoses:  Drug ingestion, accidental or unintentional, initial encounter   Accidentally ingested edible marijuana and now with effects related to it: nausea, anxiety, chest pressure. Will tx symptomatically and reassess.  On reassessment, patient with normal VS, appears well, still with anxiety. anoither dose of ativan given, however as patient hasn't decompensated and there is low likelihood for ACS, PE, or other acute causes of symptoms will dc home to mother's care. Told them that effects could be up to 8 hours or longer from time of ingestion. If any worsening/change in symptoms, patient will return.    I personally performed the services described in this documentation, which was scribed in my presence. The recorded information has been reviewed and is accurate.    [name] Pew, MD 06/04/15 [phone]

## 2015-06-03 NOTE — ED Notes (Signed)
Pt was at [employer], ate a large brownie, maker informed her it had weed mixed in it.  States she is having vomiting, feeling unwell and difficulty breathing since this time.  Respirations even and unlabored, does not appear to be in any respiratory distress.

## 2015-06-26 ENCOUNTER — Encounter (HOSPITAL_COMMUNITY): Payer: Self-pay | Admitting: Emergency Medicine

## 2015-06-26 ENCOUNTER — Emergency Department (HOSPITAL_COMMUNITY)
Admission: EM | Admit: 2015-06-26 | Discharge: 2015-06-26 | Disposition: A | Discharge status: Home / Self Care | Payer: No Typology Code available for payment source | Attending: Emergency Medicine | Admitting: Emergency Medicine

## 2015-06-26 DIAGNOSIS — K219 Gastro-esophageal reflux disease without esophagitis: Secondary | ICD-10-CM | POA: Insufficient documentation

## 2015-06-26 DIAGNOSIS — Z79899 Other long term (current) drug therapy: Secondary | ICD-10-CM | POA: Insufficient documentation

## 2015-06-26 DIAGNOSIS — Z880 Allergy status to penicillin: Secondary | ICD-10-CM | POA: Insufficient documentation

## 2015-06-26 DIAGNOSIS — G43809 Other migraine, not intractable, without status migrainosus: Secondary | ICD-10-CM

## 2015-06-26 DIAGNOSIS — Z862 Personal history of diseases of the blood and blood-forming organs and certain disorders involving the immune mechanism: Secondary | ICD-10-CM | POA: Insufficient documentation

## 2015-06-26 DIAGNOSIS — Z87442 Personal history of urinary calculi: Secondary | ICD-10-CM | POA: Insufficient documentation

## 2015-06-26 MED ORDER — METOCLOPRAMIDE HCL 5 MG/ML IJ SOLN
10.0000 mg | Freq: Once | INTRAMUSCULAR | Status: AC
  Administered 2015-06-26: 10 mg via INTRAVENOUS
  Filled 2015-06-26: qty 2

## 2015-06-26 MED ORDER — KETOROLAC TROMETHAMINE 30 MG/ML IJ SOLN
30.0000 mg | Freq: Once | INTRAMUSCULAR | Status: AC
  Administered 2015-06-26: 30 mg via INTRAVENOUS
  Filled 2015-06-26: qty 1

## 2015-06-26 MED ORDER — SODIUM CHLORIDE 0.9 % IV BOLUS (SEPSIS)
1000.0000 mL | Freq: Once | INTRAVENOUS | Status: AC
  Administered 2015-06-26: 1000 mL via INTRAVENOUS

## 2015-06-26 MED ORDER — DEXAMETHASONE SODIUM PHOSPHATE 10 MG/ML IJ SOLN
10.0000 mg | Freq: Once | INTRAMUSCULAR | Status: AC
  Administered 2015-06-26: 10 mg via INTRAVENOUS
  Filled 2015-06-26: qty 1

## 2015-06-26 MED ORDER — DIPHENHYDRAMINE HCL 50 MG/ML IJ SOLN
25.0000 mg | Freq: Once | INTRAMUSCULAR | Status: AC
  Administered 2015-06-26: 25 mg via INTRAVENOUS
  Filled 2015-06-26: qty 1

## 2015-06-26 NOTE — ED Notes (Signed)
Patient here with complaints of migraine x2 days. Sensitivity to light. Nausea/vomiting. Ibuprofen with no relief.

## 2015-06-26 NOTE — ED Notes (Signed)
RN at bedside

## 2015-06-26 NOTE — Discharge Instructions (Signed)

## 2015-06-26 NOTE — ED Provider Notes (Signed)
CSN: [id number]     Arrival date & time 06/26/15  [id number] History   First MD Initiated Contact with Patient 06/26/15 0701     Chief Complaint  Patient presents with  . Migraine     (Consider location/radiation/quality/duration/timing/severity/associated sxs/prior Treatment) HPI Comments: Patient is a 39 year old female with history of migraines. She presents with headache that is unrelieved with all medications. She reports blurry vision and nausea but no vomiting. This feels similar to her migraines, I will will not go away with her usual treatment.  Patient is a 39 y.o. female presenting with migraines. The history is provided by the patient.  Migraine This is a new problem. The current episode started 2 days ago. The problem occurs constantly. The problem has been gradually worsening. Associated symptoms include headaches. Nothing aggravates the symptoms. Nothing relieves the symptoms. She has tried acetaminophen for the symptoms.    Past Medical History  Diagnosis Date  . GERD (gastroesophageal reflux disease)   . Migraine   . Anemia   . Kidney stones    Past Surgical History  Procedure Laterality Date  . Tubal ligation  2005  . Tonsillectomy  1991  . Esophagogastroduodenoscopy N/A 11/17/2013    Procedure: ESOPHAGOGASTRODUODENOSCOPY (EGD);  Surgeon: Milus Banister, MD;  Location: [place];  Service: Endoscopy;  Laterality: N/A;   Family History  Problem Relation Age of Onset  . Asthma Mother   . Diabetes Mother   . Colon polyps Mother   . Esophageal cancer Maternal Grandfather   . Ovarian cancer Maternal Grandmother   . Diabetes Maternal Grandmother    Social History  Substance Use Topics  . Smoking status: Never Smoker   . Smokeless tobacco: Never Used  . Alcohol Use: No   OB History    No data available     Review of Systems  Neurological: Positive for headaches.  All other systems reviewed and are negative.     Allergies  Hydrocodone and  Penicillins  Home Medications   Prior to Admission medications   Medication Sig Start Date End Date Taking? Authorizing Provider  butalbital-acetaminophen-caffeine (FIORICET WITH CODEINE) 50-325-40-30 MG per capsule Take 1 capsule by mouth every 4 (four) hours as needed for headache.    Historical Provider, MD  butalbital-acetaminophen-caffeine (FIORICET, ESGIC) [phone] MG per tablet  11/04/13   Historical Provider, MD  dexlansoprazole (DEXILANT) 60 MG capsule Take 1 capsule (60 mg total) by mouth daily. 11/17/13   Milus Banister, MD  famotidine (PEPCID) 20 MG tablet Take 1 tablet (20 mg total) by mouth 2 (two) times daily. 11/04/14   Dalia Heading, PA-C  omeprazole (PRILOSEC) 40 MG capsule Take 1 capsule (40 mg total) by mouth daily. 12/08/13   Milus Banister, MD  sucralfate (CARAFATE) 1 G tablet Take 1 tablet (1 g total) by mouth 4 (four) times daily -  with meals and at bedtime. 11/04/14   Dalia Heading, PA-C  tizanidine (ZANAFLEX) 2 MG capsule Take 2 mg by mouth daily.    Historical Provider, MD  topiramate (TOPAMAX) 50 MG tablet Take 50 mg by mouth daily.    Historical Provider, MD  traMADol (ULTRAM) 50 MG tablet Take 1 tablet (50 mg total) by mouth every 6 (six) hours as needed. 11/04/14   Christopher Lawyer, PA-C   BP 133/80 mmHg  Pulse 67  Temp(Src) 98.4 F (36.9 C) (Oral)  Resp 18  SpO2 98%  LMP 05/27/2015 Physical Exam  Constitutional: She is oriented to person, place, and time. She  appears well-developed and well-nourished. No distress.  HENT:  Head: Normocephalic and atraumatic.  Eyes: EOM are normal. Pupils are equal, round, and reactive to light.  No papilledema on funduscopic exam  Neck: Normal range of motion. Neck supple.  Musculoskeletal: Normal range of motion. She exhibits no edema.  Lymphadenopathy:    She has no cervical adenopathy.  Neurological: She is alert and oriented to person, place, and time. No cranial nerve deficit. She exhibits normal muscle  tone. Coordination normal.  Skin: Skin is warm and dry. She is not diaphoretic.  Nursing note and vitals reviewed.   ED Course  Procedures (including critical care time) Labs Review Labs Reviewed - No data to display  Imaging Review No results found. I have personally reviewed and evaluated these images and lab results as part of my medical decision-making.    MDM   Final diagnoses:  None    Patient's headache is completely resolved with a migraine cocktail. She will be discharged to home, return as needed for any problems.    Veryl Speak, MD 06/26/15 [phone]

## 2015-10-15 ENCOUNTER — Encounter (HOSPITAL_COMMUNITY)
Admission: EM | Disposition: A | Discharge status: Home / Self Care | Payer: Self-pay | Source: Home / Self Care | Attending: Emergency Medicine

## 2015-10-15 ENCOUNTER — Telehealth: Payer: Self-pay

## 2015-10-15 ENCOUNTER — Ambulatory Visit (HOSPITAL_COMMUNITY)
Admission: EM | Admit: 2015-10-15 | Discharge: 2015-10-15 | Disposition: A | Discharge status: Home / Self Care | Payer: No Typology Code available for payment source | Attending: Emergency Medicine | Admitting: Emergency Medicine

## 2015-10-15 ENCOUNTER — Encounter (HOSPITAL_COMMUNITY): Payer: Self-pay | Admitting: *Deleted

## 2015-10-15 DIAGNOSIS — Z880 Allergy status to penicillin: Secondary | ICD-10-CM | POA: Insufficient documentation

## 2015-10-15 DIAGNOSIS — X58XXXA Exposure to other specified factors, initial encounter: Secondary | ICD-10-CM | POA: Insufficient documentation

## 2015-10-15 DIAGNOSIS — K222 Esophageal obstruction: Secondary | ICD-10-CM | POA: Insufficient documentation

## 2015-10-15 DIAGNOSIS — Q394 Esophageal web: Secondary | ICD-10-CM

## 2015-10-15 DIAGNOSIS — K219 Gastro-esophageal reflux disease without esophagitis: Secondary | ICD-10-CM | POA: Insufficient documentation

## 2015-10-15 DIAGNOSIS — T18128A Food in esophagus causing other injury, initial encounter: Secondary | ICD-10-CM | POA: Insufficient documentation

## 2015-10-15 HISTORY — PX: ESOPHAGOGASTRODUODENOSCOPY: SHX5428

## 2015-10-15 SURGERY — EGD (ESOPHAGOGASTRODUODENOSCOPY)
Anesthesia: Moderate Sedation

## 2015-10-15 MED ORDER — KETOROLAC TROMETHAMINE 30 MG/ML IJ SOLN
30.0000 mg | Freq: Once | INTRAMUSCULAR | Status: AC
  Administered 2015-10-15: 30 mg via INTRAVENOUS
  Filled 2015-10-15: qty 1

## 2015-10-15 MED ORDER — DIPHENHYDRAMINE HCL 50 MG/ML IJ SOLN
INTRAMUSCULAR | Status: DC | PRN
  Administered 2015-10-15: 25 mg via INTRAVENOUS

## 2015-10-15 MED ORDER — GLUCAGON HCL RDNA (DIAGNOSTIC) 1 MG IJ SOLR
1.0000 mg | Freq: Once | INTRAMUSCULAR | Status: AC
  Administered 2015-10-15: 1 mg via INTRAVENOUS
  Filled 2015-10-15: qty 1

## 2015-10-15 MED ORDER — SODIUM CHLORIDE 0.9 % IV BOLUS (SEPSIS)
1000.0000 mL | Freq: Once | INTRAVENOUS | Status: AC
  Administered 2015-10-15: 1000 mL via INTRAVENOUS

## 2015-10-15 MED ORDER — FENTANYL CITRATE (PF) 100 MCG/2ML IJ SOLN
INTRAMUSCULAR | Status: DC | PRN
  Administered 2015-10-15 (×3): 25 ug via INTRAVENOUS

## 2015-10-15 MED ORDER — MIDAZOLAM HCL 5 MG/ML IJ SOLN
INTRAMUSCULAR | Status: AC
  Filled 2015-10-15: qty 2

## 2015-10-15 MED ORDER — DIPHENHYDRAMINE HCL 50 MG/ML IJ SOLN
INTRAMUSCULAR | Status: AC
  Filled 2015-10-15: qty 1

## 2015-10-15 MED ORDER — OMEPRAZOLE 40 MG PO CPDR: 40.0000 mg | DELAYED_RELEASE_CAPSULE | Freq: Two times a day (BID) | ORAL | Status: DC

## 2015-10-15 MED ORDER — MIDAZOLAM HCL 10 MG/2ML IJ SOLN
INTRAMUSCULAR | Status: DC | PRN
  Administered 2015-10-15: 2 mg via INTRAVENOUS
  Administered 2015-10-15: 1 mg via INTRAVENOUS
  Administered 2015-10-15: 2 mg via INTRAVENOUS

## 2015-10-15 MED ORDER — FENTANYL CITRATE (PF) 100 MCG/2ML IJ SOLN
INTRAMUSCULAR | Status: AC
  Filled 2015-10-15: qty 2

## 2015-10-15 MED ORDER — METOCLOPRAMIDE HCL 5 MG/ML IJ SOLN
10.0000 mg | Freq: Once | INTRAMUSCULAR | Status: AC
  Administered 2015-10-15: 10 mg via INTRAVENOUS
  Filled 2015-10-15: qty 2

## 2015-10-15 NOTE — Telephone Encounter (Signed)
Follow up scheduled for 12/13/15 3:30 pm.  Letter mailed to the pt

## 2015-10-15 NOTE — ED Notes (Signed)
The pt ate pork chop meast 2030 tonight  Since then she has not been able to swallow water it comes back.  She has had this problem for awhile.   The last time she had her esophagus stretched was 3 years ago,  She is also c/o a migraine headache

## 2015-10-15 NOTE — ED Notes (Signed)
No airway problem she  Feels it sitting in the back of her throat

## 2015-10-15 NOTE — Telephone Encounter (Signed)
-----   Message from Gatha Mayer, MD sent at 10/15/2015  7:26 AM EDT ----- Regarding: needs f/u Just removed food impaction - would call her next week and arrange f/u Dr. Ardis Hughs in 1 month or so

## 2015-10-15 NOTE — Discharge Instructions (Signed)
° °  I removed the pork and dilated the esophagus some but think you will most likely need additional work on the ring or narrow area. I have changed omeprazole prescription to 40 mg twice a day.  Our office will contact you about a follow-up but if you do not hear by next week cALL us PLEASE.  I appreciate the opportunity to care for you. Gatha Mayer, MD, FACG   YOU HAD AN ENDOSCOPIC PROCEDURE TODAY: Refer to the procedure report and other information in the discharge instructions given to you for any specific questions about what was found during the examination. If this information does not answer your questions, please call Dr. Celesta Aver office at [phone] to clarify.   YOU SHOULD EXPECT: Some feelings of bloating in the abdomen. Passage of more gas than usual. Walking can help get rid of the air that was put into your GI tract during the procedure and reduce the bloating. If you had a lower endoscopy (such as a colonoscopy or flexible sigmoidoscopy) you may notice spotting of blood in your stool or on the toilet paper. Some abdominal soreness may be present for a day or two, also.  DIET:    CLEAR LIQUIDS ONLY UNTIL 9 am ONLY THEN SOFT FOODS. MAY TRY NORMAL FOODS TOMORROW BUT CUT SMALL AND CHEW WELL   ACTIVITY: Your care partner should take you home directly after the procedure. You should plan to take it easy, moving slowly for the rest of the day. You can resume normal activity the day after the procedure however YOU SHOULD NOT DRIVE, use power tools, machinery or perform tasks that involve climbing or major physical exertion for 24 hours (because of the sedation medicines used during the test).   SYMPTOMS TO REPORT IMMEDIATELY: A gastroenterologist can be reached at any hour. Please call [phone]  for any of the following symptoms:   Following upper endoscopy (EGD, EUS, ERCP, esophageal dilation) Vomiting of blood or coffee ground material  New, significant abdominal  pain  New, significant chest pain or pain under the shoulder blades  Painful or persistently difficult swallowing  New shortness of breath  Black, tarry-looking or red, bloody stools  FOLLOW UP:  If any biopsies were taken you will be contacted by phone or by letter within the next 1-3 weeks. Call [phone]  if you have not heard about the biopsies in 3 weeks.  Please also call with any specific questions about appointments or follow up tests.

## 2015-10-15 NOTE — ED Notes (Signed)
MD at bedside. 

## 2015-10-15 NOTE — H&P (Signed)
[facility] History and Physical   Primary Care Physician:  Smothers, Andree Elk, NP   Reason for Procedure:   Food impaction  Plan:    Upper endoscopy and removal of food impaction, possible dilation  The risks and benefits as well as alternatives of endoscopic procedure(s) have been discussed and reviewed. All questions answered. The patient agrees to proceed.      HPI: [name] [name] is a 40 y.o. 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   Past Medical History  Diagnosis Date  . GERD (gastroesophageal reflux disease)   . Migraine   . Anemia   . Kidney stones     Past Surgical History  Procedure Laterality Date  . Tubal ligation  2005  . Tonsillectomy  1991  . Esophagogastroduodenoscopy N/A 11/17/2013    Procedure: ESOPHAGOGASTRODUODENOSCOPY (EGD);  Surgeon: Milus Banister, MD;  Location: [place];  Service: Endoscopy;  Laterality: N/A;    Prior to Admission medications   Medication Sig Start Date End Date Taking? Authorizing Provider  omeprazole (PRILOSEC) 40 MG capsule Take 1 capsule (40 mg total) by mouth daily. 12/08/13  Yes Milus Banister, MD  dexlansoprazole (DEXILANT) 60 MG capsule Take 1 capsule (60 mg total) by mouth daily. Patient not taking: Reported on 06/26/2015 11/17/13   Milus Banister, MD  famotidine (PEPCID) 20 MG tablet Take 1 tablet (20 mg total) by mouth 2 (two) times daily. Patient not taking: Reported on 06/26/2015 11/04/14   Dalia Heading, PA-C  sucralfate (CARAFATE) 1 G tablet Take 1 tablet (1 g total) by mouth 4 (four) times daily -  with meals and at bedtime. Patient not taking: Reported on 06/26/2015 11/04/14   Dalia Heading, PA-C  traMADol (ULTRAM) 50 MG tablet Take 1 tablet (50 mg total) by mouth every 6 (six) hours as needed. Patient not taking: Reported on 06/26/2015 11/04/14   Dalia Heading, PA-C    No current facility-administered medications for this encounter.    Current Outpatient Prescriptions  Medication Sig Dispense Refill  . omeprazole (PRILOSEC) 40 MG capsule Take 1 capsule (40 mg total) by mouth daily. 30 capsule 11  . dexlansoprazole (DEXILANT) 60 MG capsule Take 1 capsule (60 mg total) by mouth daily. (Patient not taking: Reported on 06/26/2015) 30 capsule 11  . famotidine (PEPCID) 20 MG tablet Take 1 tablet (20 mg total) by mouth 2 (two) times daily. (Patient not taking: Reported on 06/26/2015) 30 tablet 0  . sucralfate (CARAFATE) 1 G tablet Take 1 tablet (1 g total) by mouth 4 (four) times daily -  with meals and at bedtime. (Patient not taking: Reported on 06/26/2015) 30 tablet 0  . traMADol (ULTRAM) 50 MG tablet Take 1 tablet (50 mg total) by mouth every 6 (six) hours as needed. (Patient not taking: Reported on 06/26/2015) 15 tablet 0    Allergies as of 10/15/2015 - Review Complete 10/15/2015  Allergen Reaction Noted  . Penicillins Shortness Of Breath and Rash 08/17/2011  . Hydrocodone Itching 10/17/2012    Family History  Problem Relation Age of Onset  . Asthma Mother   . Diabetes Mother   . Colon polyps Mother   . Esophageal cancer Maternal Grandfather   . Ovarian cancer Maternal Grandmother   . Diabetes Maternal Grandmother     Social History   Social History  . Marital Status: Divorced    Spouse Name: N/A  . Number of Children: N/A  . Years of Education: N/A  Occupational History  . Not on file.   Social History Main Topics  . Smoking status: Never Smoker   . Smokeless tobacco: Never Used  . Alcohol Use: No  . Drug Use: No  . Sexual Activity: Not on file   Other Topics Concern  . Not on file   Social History Narrative    Review of Systems:  All other review of systems negative except as mentioned in the HPI.  Physical Exam: Vital signs in last 24 hours: Temp:  [98.1 F (36.7 C)] 98.1 F (36.7 C) (04/07 0052) Pulse Rate:  [72-89] 72 (04/07 0600) Resp:  [18] 18 (04/07 0052) BP: (112-133)/(68-97)  124/80 mmHg (04/07 0600) SpO2:  [99 %-100 %] 99 % (04/07 0600) Weight:  [190 lb 1 oz (86.212 kg)] 190 lb 1 oz (86.212 kg) (04/07 0052)   General:   Alert,  Well-developed, well-nourished, pleasant and cooperative in NAD Lungs:  Clear throughout to auscultation.   Heart:  Regular rate and rhythm; no murmurs, clicks, rubs,  or gallops. Abdomen:  Soft, nontender and nondistended. Normal bowel sounds.   Neuro/Psych:  Alert and cooperative. Normal mood and affect. A and O x 3   @Carl  Simonne Maffucci, MD, [facility] [phone] (pager) 10/15/2015 6:35 AM@

## 2015-10-15 NOTE — Op Note (Signed)
[facility] Patient Name: [name] [name] Procedure Date : 10/15/2015 MRN: [id number] Attending MD: Gatha Mayer , MD Date of Birth: May 19, 1976 CSN: [id number] Age: 40 Admit Type: Outpatient Procedure:                Upper GI endoscopy Indications:              Dysphagia, Foreign body in the esophagus Providers:                Gatha Mayer, MD, Hilma Favors, RN, Alfonso Patten,                            Technician Referring MD:              Medicines:                Diphenhydramine 25 mg IV, Midazolam 5 mg IV,                            Fentanyl 75 micrograms IV, Cetacaine spray Complications:            No immediate complications. Estimated blood loss:                            Minimal. Estimated Blood Loss:     Estimated blood loss was minimal. Procedure:                Pre-Anesthesia Assessment:                           - Prior to the procedure, a History and Physical                            was performed, and patient medications and                            allergies were reviewed. The patient's tolerance of                            previous anesthesia was also reviewed. The risks                            and benefits of the procedure and the sedation                            options and risks were discussed with the patient.                            All questions were answered, and informed consent                            was obtained. Prior Anticoagulants: The patient has                            taken no previous anticoagulant or antiplatelet  agents. ASA Grade Assessment: II - A patient with                            mild systemic disease. After reviewing the risks                            and benefits, the patient was deemed in                            satisfactory condition to undergo the procedure.                           After obtaining informed consent, the endoscope was   passed under direct vision. Throughout the                            procedure, the patient's blood pressure, pulse, and                            oxygen saturations were monitored continuously. The                            [id number]) scope was introduced through the                            mouth, and advanced to the prepyloric region,                            stomach. The upper GI endoscopy was accomplished                            without difficulty. The patient tolerated the                            procedure well. Scope In: Scope Out: Findings:      Food was found in the distal esophagus. Removal of food was       accomplished. Estimated blood loss was minimal.      One moderate (circumferential scarring or stenosis; an endoscope may       pass) benign-appearing, intrinsic stenosis was found at the       gastroesophageal junction. This measured 1.6 cm (inner diameter) and was       traversed. A TTS dilator was passed through the scope. Dilation with a       15-16.5-18 mm balloon dilator was performed to 18 mm. The dilation site       was examined and showed mild improvement in luminal narrowing. Estimated       blood loss was minimal.      The stomach was normal.      The cardia and gastric fundus were normal on retroflexion.      The exam was otherwise without abnormality. Impression:               - Food in the distal esophagus. Removal was  successful.                           - Benign-appearing esophageal stenosis. Dilated.                           - Normal stomach. Moderate Sedation:      Moderate (conscious) sedation was administered by the endoscopy nurse       and supervised by the endoscopist. The following parameters were       monitored: oxygen saturation, heart rate, blood pressure, and response       to care. Total physician intraservice time was 15 minutes. Recommendation:           - Patient has a contact number  available for                            emergencies. The signs and symptoms of potential                            delayed complications were discussed with the                            patient. Return to normal activities tomorrow.                            Written discharge instructions were provided to the                            patient.                           - Clear liquids x 1 hour then soft foods rest of                            day. Start prior diet tomorrow.                           - Use Prilosec (omeprazole) 40 mg PO BID                            indefinitely.                           - Return to GI office at appointment to be                            scheduled.                           - Dr. Ardis Hughs                           - Chew food well and cut into small pieces.                           I think she may need dilation up to 20 mm or  incision of ring with forceps or both.                           - Continue present medications. Procedure Code(s):        --- Professional ---                           [phone], Esophagoscopy, flexible, transoral; with                            removal of foreign body(s)                           43220, Esophagoscopy, flexible, transoral; with                            transendoscopic balloon dilation (less than 30 mm                            diameter)                           99152, Moderate sedation services provided by the                            same physician or other qualified health care                            professional performing the diagnostic or                            therapeutic service that the sedation supports,                            requiring the presence of an independent trained                            observer to assist in the monitoring of the                            patient's level of consciousness and physiological                             status; initial 15 minutes of intraservice time,                            patient age 83 years or older Diagnosis Code(s):        --- Professional ---                           [phone], Food in esophagus causing other injury,                            initial encounter  K22.2, Esophageal obstruction                           R13.10, Dysphagia, unspecified                           T18.108A, Unspecified foreign body in esophagus                            causing other injury, initial encounter CPT copyright 2016 [employer]. All rights reserved. The codes documented in this report are preliminary and upon coder review may  be revised to meet current compliance requirements. Gatha Mayer, MD 10/15/2015 7:26:46 AM This report has been signed electronically. Number of Addenda: 0

## 2015-10-15 NOTE — ED Notes (Signed)
Endo at bedside for procedure

## 2015-10-15 NOTE — ED Provider Notes (Signed)
CSN: [id number]     Arrival date & time 10/15/15  0049 History  By signing my name below, I, [facility] attest that this documentation has been prepared under the direction and in the presence of Everlene Balls, MD. Electronically Signed: Virgel Bouquet, ED Scribe. 10/15/2015. 1:47 AM.   Chief Complaint  Patient presents with  . food impaction esophagus    The history is provided by the patient. No language interpreter was used.   HPI Comments: [name] [name] is a 40 y.o. female with an hx of GERD who presents to the Emergency Department complaining of constant, moderate difficulty swallowing onset earlier tonight after a mea. Patient reports that she was eating a pork chop when she felt the food become lodged in her esophagus. She states that she has been unable to drink water or swallow her saliva since this meal. She endorses HA that she associates with her menstrual cycle. She takes Prilosec daily. Per pt, she notes similar symptoms in the past and states that she has had her esophagus stretched twice in the past, most recently 3 years ago. Denies any other symptoms currently.  Past Medical History  Diagnosis Date  . GERD (gastroesophageal reflux disease)   . Migraine   . Anemia   . Kidney stones    Past Surgical History  Procedure Laterality Date  . Tubal ligation  2005  . Tonsillectomy  1991  . Esophagogastroduodenoscopy N/A 11/17/2013    Procedure: ESOPHAGOGASTRODUODENOSCOPY (EGD);  Surgeon: Milus Banister, MD;  Location: [place];  Service: Endoscopy;  Laterality: N/A;   Family History  Problem Relation Age of Onset  . Asthma Mother   . Diabetes Mother   . Colon polyps Mother   . Esophageal cancer Maternal Grandfather   . Ovarian cancer Maternal Grandmother   . Diabetes Maternal Grandmother    Social History  Substance Use Topics  . Smoking status: Never Smoker   . Smokeless tobacco: Never Used  . Alcohol Use: No   OB History    No data available      Review of Systems A complete 10 system review of systems was obtained and all systems are negative except as noted in the HPI and PMH.  Allergies  Penicillins and Hydrocodone  Home Medications   Prior to Admission medications   Medication Sig Start Date End Date Taking? Authorizing Provider  dexlansoprazole (DEXILANT) 60 MG capsule Take 1 capsule (60 mg total) by mouth daily. Patient not taking: Reported on 06/26/2015 11/17/13   Milus Banister, MD  famotidine (PEPCID) 20 MG tablet Take 1 tablet (20 mg total) by mouth 2 (two) times daily. Patient not taking: Reported on 06/26/2015 11/04/14   Dalia Heading, PA-C  ibuprofen (ADVIL,MOTRIN) 200 MG tablet Take 800 mg by mouth every 6 (six) hours as needed for moderate pain.    Historical Provider, MD  omeprazole (PRILOSEC OTC) 20 MG tablet Take 20 mg by mouth daily as needed (heartburn).    Historical Provider, MD  omeprazole (PRILOSEC) 40 MG capsule Take 1 capsule (40 mg total) by mouth daily. Patient not taking: Reported on 06/26/2015 12/08/13   Milus Banister, MD  sucralfate (CARAFATE) 1 G tablet Take 1 tablet (1 g total) by mouth 4 (four) times daily -  with meals and at bedtime. Patient not taking: Reported on 06/26/2015 11/04/14   Dalia Heading, PA-C  traMADol (ULTRAM) 50 MG tablet Take 1 tablet (50 mg total) by mouth every 6 (six) hours as needed. Patient not  taking: Reported on 06/26/2015 11/04/14   Dalia Heading, PA-C   BP 129/97 mmHg  Pulse 89  Temp(Src) 98.1 F (36.7 C) (Oral)  Resp 18  Ht 5\' 7"  (1.702 m)  Wt 190 lb 1 oz (86.212 kg)  BMI 29.76 kg/m2  SpO2 99%  LMP 09/15/2015 Physical Exam  Constitutional: She is oriented to person, place, and time. She appears well-developed and well-nourished. No distress.  HENT:  Head: Normocephalic and atraumatic.  Nose: Nose normal.  Mouth/Throat: Oropharynx is clear and moist. No oropharyngeal exudate.  Eyes: Conjunctivae and EOM are normal. Pupils are equal, round, and  reactive to light. No scleral icterus.  Neck: Normal range of motion. Neck supple. No JVD present. No tracheal deviation present. No thyromegaly present.  Cardiovascular: Normal rate, regular rhythm and normal heart sounds.  Exam reveals no gallop and no friction rub.   No murmur heard. Pulmonary/Chest: Effort normal and breath sounds normal. No respiratory distress. She has no wheezes. She exhibits no tenderness.  Abdominal: Soft. Bowel sounds are normal. She exhibits no distension and no mass. There is no tenderness. There is no rebound and no guarding.  Musculoskeletal: Normal range of motion. She exhibits no edema or tenderness.  Lymphadenopathy:    She has no cervical adenopathy.  Neurological: She is alert and oriented to person, place, and time. No cranial nerve deficit. She exhibits normal muscle tone.  Skin: Skin is warm and dry. No rash noted. No erythema. No pallor.  Nursing note and vitals reviewed.   ED Course  Procedures   DIAGNOSTIC STUDIES: Oxygen Saturation is 99% on RA, normal by my interpretation.    COORDINATION OF CARE: 1:40 AM Will order Reglan, glucagon, and Toradol. Discussed treatment plan with pt at bedside and pt agreed to plan.   Labs Review Labs Reviewed - No data to display  Imaging Review No results found. I have personally reviewed and evaluated these images and lab results as part of my medical decision-making.   EKG Interpretation None      MDM   Final diagnoses:  None    Patient presents emergency department for impaction of pork chop.  She was given Reglan and the glucagon. She still is unable to swallow any water. She was redosed with glucagon again however this still did not work. I will page gastroenterology to help remove the food bolus.   4:40 AM GI on call is coming in to assist with this patient.  6:29 AM GI has arrived for procedure.  Patient will be signed out to oncoming provider for further ED care.  I personally  performed the services described in this documentation, which was scribed in my presence. The recorded information has been reviewed and is accurate.     Everlene Balls, MD 10/15/15 0630

## 2015-10-15 NOTE — ED Notes (Signed)
Pt airway intact, alert and oriented x4, ambulatory with steady gait. Husband arriving to transport patient home given patient received sedation by GI here in ER. Discharge instructions reviewed with patient, no questions at this time.

## 2015-10-15 NOTE — ED Notes (Signed)
Pt still unable to swallow saliva or water after 30 minutes after medication; Md notified

## 2015-10-15 NOTE — Brief Op Note (Signed)
10/15/2015  7:08 AM  PATIENT:  [name] [name]  40 y.o. female  PRE-OPERATIVE DIAGNOSIS:  esophageal obstruction/food impaction  POST-OPERATIVE DIAGNOSIS:  food impaction, dilated 16-18  PROCEDURE:  Procedure(s): ESOPHAGOGASTRODUODENOSCOPY (EGD) (N/A)  SURGEON:  Surgeon(s) and Role:    * Gatha Mayer, MD - Primary   ANESTHESIA:   IV sedation Fentanyl 75 ug, Versed 5 mg and Benadryl 25 mg  EBL:   minimal   Findings:  Meat lodged in esophagus gently advanced from distal esophagus into stomach revealing Schatzki Ring - slightly traumatized. Stomach grossly normal. Balloon dilation 15,16.5 and 18 mm with slight effect.  Plan to increase omeprazole to 40 mg bid and arrange f/u Dr. Ardis Hughs

## 2015-10-15 NOTE — ED Notes (Signed)
Endoscopy at bedside. 

## 2015-10-19 ENCOUNTER — Encounter (HOSPITAL_COMMUNITY): Payer: Self-pay | Admitting: Internal Medicine

## 2015-12-13 ENCOUNTER — Other Ambulatory Visit: Payer: Self-pay

## 2015-12-13 ENCOUNTER — Ambulatory Visit: Payer: No Typology Code available for payment source | Admitting: Gastroenterology

## 2016-04-13 ENCOUNTER — Encounter (HOSPITAL_COMMUNITY): Payer: Self-pay | Admitting: Emergency Medicine

## 2016-04-13 ENCOUNTER — Ambulatory Visit (HOSPITAL_COMMUNITY)
Admission: EM | Admit: 2016-04-13 | Discharge: 2016-04-13 | Disposition: A | Discharge status: Home / Self Care | Payer: No Typology Code available for payment source | Attending: Internal Medicine | Admitting: Internal Medicine

## 2016-04-13 DIAGNOSIS — T700XXA Otitic barotrauma, initial encounter: Secondary | ICD-10-CM

## 2016-04-13 DIAGNOSIS — J309 Allergic rhinitis, unspecified: Secondary | ICD-10-CM

## 2016-04-13 DIAGNOSIS — J329 Chronic sinusitis, unspecified: Secondary | ICD-10-CM

## 2016-04-13 MED ORDER — PREDNISONE 20 MG PO TABS: ORAL_TABLET | ORAL | 0 refills | Status: DC

## 2016-04-13 MED ORDER — TRIAMCINOLONE ACETONIDE 40 MG/ML IJ SUSP: 40.0000 mg | Freq: Once | INTRAMUSCULAR | Status: DC

## 2016-04-13 MED ORDER — PHENYLEPHRINE-CHLORPHEN-DM 10-4-12.5 MG/5ML PO LIQD: 5.0000 mL | ORAL | 0 refills | Status: DC | PRN

## 2016-04-13 NOTE — ED Provider Notes (Signed)
CSN: [id number]     Arrival date & time 04/13/16  1424 History   None    Chief Complaint  Patient presents with  . Facial Pain   (Consider location/radiation/quality/duration/timing/severity/associated sxs/prior Treatment) 40 year old female presents to the urgent care stating that she has had a sinus infection for over a year. She states the symptoms have not changed except for she has pain in the right ear now. She is complaining of facial and head pressure, runny nose, nasal stuffiness and PND. Denies fever, chills or unilateral only symptoms. Patient states she has taken several OTC medications without consistent relief.      Past Medical History:  Diagnosis Date  . Anemia   . GERD (gastroesophageal reflux disease)   . Kidney stones   . Migraine    Past Surgical History:  Procedure Laterality Date  . ESOPHAGOGASTRODUODENOSCOPY N/A 11/17/2013   Procedure: ESOPHAGOGASTRODUODENOSCOPY (EGD);  Surgeon: Milus Banister, MD;  Location: [place];  Service: Endoscopy;  Laterality: N/A;  . ESOPHAGOGASTRODUODENOSCOPY N/A 10/15/2015   Procedure: ESOPHAGOGASTRODUODENOSCOPY (EGD);  Surgeon: Gatha Mayer, MD;  Location: [facility] ENDOSCOPY;  Service: Endoscopy;  Laterality: N/A;  . TONSILLECTOMY  1991  . TUBAL LIGATION  2005   Family History  Problem Relation Age of Onset  . Asthma Mother   . Diabetes Mother   . Colon polyps Mother   . Esophageal cancer Maternal Grandfather   . Ovarian cancer Maternal Grandmother   . Diabetes Maternal Grandmother    Social History  Substance Use Topics  . Smoking status: Never Smoker  . Smokeless tobacco: Never Used  . Alcohol use No   OB History    No data available     Review of Systems  Constitutional: Negative for activity change, appetite change, chills, fatigue and fever.  HENT: Positive for congestion, ear pain, postnasal drip and rhinorrhea. Negative for dental problem, facial swelling, sore throat and trouble swallowing.   Eyes:  Negative.   Respiratory: Negative.  Negative for shortness of breath.   Cardiovascular: Negative.   Genitourinary: Negative.   Musculoskeletal: Negative for neck pain and neck stiffness.  Skin: Negative for pallor and rash.  Neurological: Negative.   All other systems reviewed and are negative.   Allergies  Penicillins and Hydrocodone  Home Medications   Prior to Admission medications   Medication Sig Start Date End Date Taking? Authorizing Provider  dexlansoprazole (DEXILANT) 60 MG capsule Take 1 capsule (60 mg total) by mouth daily. Patient not taking: Reported on 04/13/2016 11/17/13   Milus Banister, MD  famotidine (PEPCID) 20 MG tablet Take 1 tablet (20 mg total) by mouth 2 (two) times daily. Patient not taking: Reported on 04/13/2016 11/04/14   Dalia Heading, PA-C  omeprazole (PRILOSEC) 40 MG capsule Take 1 capsule (40 mg total) by mouth 2 (two) times daily before a meal. 10/15/15   Gatha Mayer, MD  Phenylephrine-Chlorphen-DM 04-13-11.5 MG/5ML LIQD Take 5 mLs by mouth every 4 (four) hours as needed. 04/13/16   Janne Napoleon, NP  predniSONE (DELTASONE) 20 MG tablet 3 Tabs PO Days 1-3, then 2 tabs PO Days 4-6, then 1 tab PO Day 7-9, then Half Tab PO Day 10-12 04/13/16   Janne Napoleon, NP  sucralfate (CARAFATE) 1 G tablet Take 1 tablet (1 g total) by mouth 4 (four) times daily -  with meals and at bedtime. Patient not taking: Reported on 04/13/2016 11/04/14   Dalia Heading, PA-C  traMADol (ULTRAM) 50 MG tablet Take 1 tablet (50 mg total)  by mouth every 6 (six) hours as needed. Patient not taking: Reported on 04/13/2016 11/04/14   Dalia Heading, PA-C   Meds Ordered and Administered this Visit   Medications  triamcinolone acetonide (KENALOG-40) injection 40 mg (not administered)    BP 117/68 (BP Location: Left Arm)   Pulse 73   Temp 98.4 F (36.9 C) (Oral)   Resp 12   LMP 03/28/2016   SpO2 100%  No data found.   Physical Exam  Constitutional: She is oriented to person,  place, and time. She appears well-developed and well-nourished. No distress.  HENT:  Head: Normocephalic and atraumatic.  Mouth/Throat: No oropharyngeal exudate.  Right TM mildly retracted. No erythema or bulging. Left TM is normal. Oropharynx with much cobblestoning and moderate clear PND. No exudates or swelling.  The lightest touch to the skin over the maxillary sinuses produces a pain response. Light pressure over the bony elements of the face including the periorbital rims, the temples and the bridge of the nose also produces a pain response.  Eyes: EOM are normal. Pupils are equal, round, and reactive to light.  Minor erythema to the lower conjunctiva.  Neck: Normal range of motion. Neck supple. No tracheal deviation present.  Cardiovascular: Normal rate, regular rhythm, normal heart sounds and intact distal pulses.   Pulmonary/Chest: Effort normal and breath sounds normal. No respiratory distress.  Abdominal: Soft. There is no tenderness. There is no rebound.  Musculoskeletal: Normal range of motion. She exhibits no edema or tenderness.  Lymphadenopathy:    She has no cervical adenopathy.  Neurological: She is alert and oriented to person, place, and time. No cranial nerve deficit.  Skin: Skin is warm and dry. No rash noted.  Psychiatric: She has a normal mood and affect.  Nursing note and vitals reviewed.   Urgent Care Course   Clinical Course    Procedures (including critical care time)  Labs Review Labs Reviewed - No data to display  Imaging Review No results found.   Visual Acuity Review  Right Eye Distance:   Left Eye Distance:   Bilateral Distance:    Right Eye Near:   Left Eye Near:    Bilateral Near:         MDM   1. Rhinosinusitis   2. Chronic allergic rhinitis, unspecified seasonality, unspecified trigger   3. Barotitis media, initial encounter    No evidence of bacterial type of infection. Take the Norell liquid medication for nasal  stuffiness and drainage. This may cause some drowsiness. Continue to use nasal saline spray frequently. Also add either Rhinocort or Flonase nasal spray to help prevent swelling and inflammation in the nose and nasal passages. Drink plenty of fluids and stay well-hydrated. Take the prednisone as directed and take it with food so it will not upset her stomach. If he continued to have the symptoms over a longer period of time recommended she follow up with your primary care provider and she may need allergy testing. Meds ordered this encounter  Medications  . predniSONE (DELTASONE) 20 MG tablet    Sig: 3 Tabs PO Days 1-3, then 2 tabs PO Days 4-6, then 1 tab PO Day 7-9, then Half Tab PO Day 10-12    Dispense:  20 tablet    Refill:  0    Order Specific Question:   Supervising Provider    Answer:   Sherlene Shams [id number]  . Phenylephrine-Chlorphen-DM 04-13-11.5 MG/5ML LIQD    Sig: Take 5 mLs by mouth every 4 (  four) hours as needed.    Dispense:  120 mL    Refill:  0    Order Specific Question:   Supervising Provider    Answer:   Sherlene Shams [id number]  . triamcinolone acetonide (KENALOG-40) injection 40 mg       Janne Napoleon, NP 04/13/16 1610

## 2016-04-13 NOTE — Discharge Instructions (Signed)
No evidence of bacterial type of infection. Take the Norell liquid medication for nasal stuffiness and drainage. This may cause some drowsiness. Continue to use nasal saline spray frequently. Also add either Rhinocort or Flonase nasal spray to help prevent swelling and inflammation in the nose and nasal passages. Drink plenty of fluids and stay well-hydrated. Take the prednisone as directed and take it with food so it will not upset her stomach. If he continued to have the symptoms over a longer period of time recommended you follow up with your primary care provider and she may need allergy testing.

## 2016-04-13 NOTE — ED Triage Notes (Signed)
Pt states she has been suffering from sinus issues for a year.  She is here today because she is having a lot of facial pain, bilateral ear pain, nasal congestion and head pain.  She takes a Clinical biochemist every day.

## 2017-05-23 ENCOUNTER — Encounter (HOSPITAL_COMMUNITY): Payer: Self-pay | Admitting: Emergency Medicine

## 2017-05-23 ENCOUNTER — Other Ambulatory Visit: Payer: Self-pay

## 2017-05-23 ENCOUNTER — Ambulatory Visit (HOSPITAL_COMMUNITY)
Admission: EM | Admit: 2017-05-23 | Discharge: 2017-05-23 | Disposition: A | Discharge status: Home / Self Care | Payer: Self-pay | Attending: Family Medicine | Admitting: Family Medicine

## 2017-05-23 DIAGNOSIS — H8110 Benign paroxysmal vertigo, unspecified ear: Secondary | ICD-10-CM

## 2017-05-23 DIAGNOSIS — R110 Nausea: Secondary | ICD-10-CM

## 2017-05-23 MED ORDER — MECLIZINE HCL 25 MG PO TABS: 25.0000 mg | ORAL_TABLET | Freq: Three times a day (TID) | ORAL | 0 refills | Status: DC | PRN

## 2017-05-23 MED ORDER — ONDANSETRON 4 MG PO TBDP
4.0000 mg | ORAL_TABLET | Freq: Once | ORAL | Status: AC
  Administered 2017-05-23: 4 mg via ORAL

## 2017-05-23 MED ORDER — ONDANSETRON 4 MG PO TBDP
ORAL_TABLET | ORAL | Status: AC
  Filled 2017-05-23: qty 1

## 2017-05-23 MED ORDER — ONDANSETRON 4 MG PO TBDP: 4.0000 mg | ORAL_TABLET | Freq: Three times a day (TID) | ORAL | 0 refills | Status: DC | PRN

## 2017-05-23 NOTE — ED Provider Notes (Signed)
[place]   [id number] 05/23/17 Arrival Time: 1443  ASSESSMENT & PLAN:  1. Benign paroxysmal positional vertigo, unspecified laterality   2. Nausea without vomiting     Meds ordered this encounter  Medications  . ondansetron (ZOFRAN-ODT) disintegrating tablet 4 mg  . meclizine (ANTIVERT) 25 MG tablet    Sig: Take 1 tablet (25 mg total) 3 (three) times daily as needed by mouth for dizziness.    Dispense:  30 tablet    Refill:  0  . ondansetron (ZOFRAN-ODT) 4 MG disintegrating tablet    Sig: Take 1 tablet (4 mg total) every 8 (eight) hours as needed by mouth for nausea or vomiting.    Dispense:  15 tablet    Refill:  0   Monitor overnight. If acute worsening she will proceed to the ED for evaluation. May return here in the morning if needed. Ensure adequate fluid intake.  Reviewed expectations re: course of current medical issues. Questions answered. Outlined signs and symptoms indicating need for more acute intervention. Patient verbalized understanding. After Visit Summary given.   SUBJECTIVE:  [name] [name] is a 41 y.o. female who presents with complaint of "feeling dizzy." Abrupt onset this morning. Describes vertigo with certain head movements. Better at rest. Nausea without emesis. Afebrile. Mild headache. Overall decreased PO intake secondary to nausea. No recent illnesses. No h/o similar symptoms. Ambulatory but needing to walk slowly. No OTC treatment. No new medications.  ROS: As per HPI.   OBJECTIVE:  Vitals:   05/23/17 1230 05/23/17 1242  BP: (!) 144/82 130/79  Pulse: 77   Resp: 18   Temp: 98 F (36.7 C)   SpO2: 100%     General appearance: alert; no distress Eyes: PERRLA; EOMI; conjunctiva normal HENT: normocephalic; atraumatic; TMs normal; nasal mucosa normal; oral mucosa normal Neck: supple Lungs: clear to auscultation bilaterally Heart: regular rate and rhythm Abdomen: soft, non-tender; bowel sounds normal; no masses or  organomegaly; no  Extremities: no cyanosis or edema; symmetrical with no gross deformities Skin: warm and dry Neurologic: normal gait; normal symmetric reflexes Psychological: alert and cooperative; normal mood and affect   Allergies  Allergen Reactions  . Penicillins Shortness Of Breath and Rash    Has patient had a PCN reaction causing immediate rash, facial/tongue/throat swelling, SOB or lightheadedness with hypotension: yes Has patient had a PCN reaction causing severe rash involving mucus membranes or skin necrosis: no Has patient had a PCN reaction that required hospitalization no Has patient had a PCN reaction occurring within the last 10 years: no If all of the above answers are "NO", then may proceed with Cephalosporin use.   Marland Kitchen Hydrocodone Itching    Past Medical History:  Diagnosis Date  . Anemia   . GERD (gastroesophageal reflux disease)   . Kidney stones   . Migraine    Social History   Socioeconomic History  . Marital status: Divorced    Spouse name: Not on file  . Number of children: Not on file  . Years of education: Not on file  . Highest education level: Not on file  Social Needs  . Financial resource strain: Not on file  . Food insecurity - worry: Not on file  . Food insecurity - inability: Not on file  . Transportation needs - medical: Not on file  . Transportation needs - non-medical: Not on file  Occupational History  . Not on file  Tobacco Use  . Smoking status: Never Smoker  . Smokeless tobacco: Never  Used  Substance and Sexual Activity  . Alcohol use: No  . Drug use: No  . Sexual activity: Not on file  Other Topics Concern  . Not on file  Social History Narrative  . Not on file   Family History  Problem Relation Age of Onset  . Asthma Mother   . Diabetes Mother   . Colon polyps Mother   . Esophageal cancer Maternal Grandfather   . Ovarian cancer Maternal Grandmother   . Diabetes Maternal Grandmother    Past Surgical History:    Procedure Laterality Date  . TONSILLECTOMY  1991  . TUBAL LIGATION  2005      Vanessa Kick, MD 05/23/17 1357

## 2017-05-23 NOTE — Discharge Instructions (Addendum)
Please return tomorrow morning if not feeling better.

## 2017-05-23 NOTE — ED Triage Notes (Signed)
Pt c/o HA, dizziness, lightheaded, nausea, hx of migraines.

## 2017-07-18 ENCOUNTER — Other Ambulatory Visit: Payer: Self-pay

## 2017-07-18 ENCOUNTER — Ambulatory Visit (HOSPITAL_COMMUNITY)
Admission: EM | Admit: 2017-07-18 | Discharge: 2017-07-18 | Disposition: A | Discharge status: Home / Self Care | Payer: BLUE CROSS/BLUE SHIELD | Attending: Family Medicine | Admitting: Family Medicine

## 2017-07-18 ENCOUNTER — Encounter (HOSPITAL_COMMUNITY): Payer: Self-pay | Admitting: Emergency Medicine

## 2017-07-18 DIAGNOSIS — R69 Illness, unspecified: Secondary | ICD-10-CM

## 2017-07-18 DIAGNOSIS — J111 Influenza due to unidentified influenza virus with other respiratory manifestations: Secondary | ICD-10-CM

## 2017-07-18 MED ORDER — OSELTAMIVIR PHOSPHATE 75 MG PO CAPS: 75.0000 mg | ORAL_CAPSULE | Freq: Two times a day (BID) | ORAL | 0 refills | Status: DC

## 2017-07-18 MED ORDER — HYDROCODONE-HOMATROPINE 5-1.5 MG/5ML PO SYRP: 5.0000 mL | ORAL_SOLUTION | Freq: Four times a day (QID) | ORAL | 0 refills | Status: DC | PRN

## 2017-07-18 NOTE — ED Triage Notes (Signed)
Pt c/o fever last night of 103, took ibuprofen and came down. C/o cough.

## 2017-07-18 NOTE — Discharge Instructions (Signed)

## 2017-07-19 NOTE — ED Provider Notes (Signed)
[place]   [id number] 07/18/17 Arrival Time: 1406  ASSESSMENT & PLAN:  1. Influenza-like illness     Meds ordered this encounter  Medications  . oseltamivir (TAMIFLU) 75 MG capsule    Sig: Take 1 capsule (75 mg total) by mouth every 12 (twelve) hours.    Dispense:  10 capsule    Refill:  0  . HYDROcodone-homatropine (HYCODAN) 5-1.5 MG/5ML syrup    Sig: Take 5 mLs by mouth every 6 (six) hours as needed for cough.    Dispense:  90 mL    Refill:  0   Medication sedation precautions. OTC symptom care as needed. Ensure adequate fluid intake and rest. May f/u with PCP or here as needed.  Reviewed expectations re: course of current medical issues. Questions answered. Outlined signs and symptoms indicating need for more acute intervention. Patient verbalized understanding. After Visit Summary given.   SUBJECTIVE: History from: patient.  [name] [name] is a 42 y.o. female who presents with complaint of nasal congestion, post-nasal drainage, and a persistent dry cough. Onset abrupt, approximately 1 day ago. Overall fatigued with body aches. SOB: none. Wheezing: none. Fever: yes. Overall normal PO intake without n/v. Sick contacts: no. OTC treatment: ibuprofen with mild help.  Received flu shot this year: no. Social History   Tobacco Use  Smoking Status Never Smoker  Smokeless Tobacco Never Used    ROS: As per HPI.   OBJECTIVE:  Vitals:   07/18/17 1435  BP: 130/71  Pulse: 94  Resp: 14  Temp: 98.4 F (36.9 C)  SpO2: 100%     General appearance: alert; appears fatigued HEENT: nasal congestion; clear runny nose; throat irritation secondary to post-nasal drainage Neck: supple without LAD Lungs: unlabored respirations, symmetrical air entry; cough: moderate; no respiratory distress Skin: warm and dry Psychological: alert and cooperative; normal mood and affect   Allergies  Allergen Reactions  . Penicillins Shortness Of Breath and Rash    Has  patient had a PCN reaction causing immediate rash, facial/tongue/throat swelling, SOB or lightheadedness with hypotension: yes Has patient had a PCN reaction causing severe rash involving mucus membranes or skin necrosis: no Has patient had a PCN reaction that required hospitalization no Has patient had a PCN reaction occurring within the last 10 years: no If all of the above answers are "NO", then may proceed with Cephalosporin use.   Marland Kitchen Hydrocodone Itching    Past Medical History:  Diagnosis Date  . Anemia   . GERD (gastroesophageal reflux disease)   . Kidney stones   . Migraine    Family History  Problem Relation Age of Onset  . Asthma Mother   . Diabetes Mother   . Colon polyps Mother   . Esophageal cancer Maternal Grandfather   . Ovarian cancer Maternal Grandmother   . Diabetes Maternal Grandmother    Social History   Socioeconomic History  . Marital status: Divorced    Spouse name: Not on file  . Number of children: Not on file  . Years of education: Not on file  . Highest education level: Not on file  Social Needs  . Financial resource strain: Not on file  . Food insecurity - worry: Not on file  . Food insecurity - inability: Not on file  . Transportation needs - medical: Not on file  . Transportation needs - non-medical: Not on file  Occupational History  . Not on file  Tobacco Use  . Smoking status: Never Smoker  . Smokeless tobacco:  Never Used  Substance and Sexual Activity  . Alcohol use: No  . Drug use: No  . Sexual activity: Not on file  Other Topics Concern  . Not on file  Social History Narrative  . Not on file           Vanessa Kick, MD 07/19/17 [phone]

## 2017-10-22 ENCOUNTER — Ambulatory Visit: Payer: Self-pay | Admitting: Allergy and Immunology

## 2018-02-05 ENCOUNTER — Other Ambulatory Visit: Payer: Self-pay | Admitting: Physician Assistant

## 2018-02-05 DIAGNOSIS — H9313 Tinnitus, bilateral: Secondary | ICD-10-CM

## 2018-02-05 DIAGNOSIS — IMO0001 Reserved for inherently not codable concepts without codable children: Secondary | ICD-10-CM

## 2018-02-05 DIAGNOSIS — H918X1 Other specified hearing loss, right ear: Secondary | ICD-10-CM

## 2018-02-09 ENCOUNTER — Other Ambulatory Visit: Payer: BLUE CROSS/BLUE SHIELD

## 2018-02-12 ENCOUNTER — Ambulatory Visit
Admission: RE | Admit: 2018-02-12 | Discharge: 2018-02-12 | Disposition: A | Discharge status: Home / Self Care | Payer: BLUE CROSS/BLUE SHIELD | Source: Ambulatory Visit | Attending: Physician Assistant | Admitting: Physician Assistant

## 2018-02-12 DIAGNOSIS — H9313 Tinnitus, bilateral: Secondary | ICD-10-CM

## 2018-02-12 DIAGNOSIS — H918X1 Other specified hearing loss, right ear: Secondary | ICD-10-CM

## 2018-02-12 DIAGNOSIS — IMO0001 Reserved for inherently not codable concepts without codable children: Secondary | ICD-10-CM

## 2018-02-12 MED ORDER — GADOBENATE DIMEGLUMINE 529 MG/ML IV SOLN
20.0000 mL | Freq: Once | INTRAVENOUS | Status: AC | PRN
  Administered 2018-02-12: 20 mL via INTRAVENOUS

## 2018-03-14 ENCOUNTER — Other Ambulatory Visit: Payer: Self-pay

## 2018-03-14 ENCOUNTER — Encounter (HOSPITAL_BASED_OUTPATIENT_CLINIC_OR_DEPARTMENT_OTHER): Payer: Self-pay

## 2018-03-14 NOTE — H&P (Signed)
  Otolaryngology Clinic Note  HPI:    [name] [name] is a 42 y.o. female patient of Sundra Aland, FNP for preop evaluation for septoplasty and reduction of turbinates.  No changes since we saw her last.  She does have significant reflux in the morning for which she takes proton pump inhibitors each morning before breakfast. PMH/Meds/All/SocHx/FamHx/ROS:   Past Medical History      Past Medical History:  Diagnosis Date  . Back ache   . Vertigo       Past Surgical History  History reviewed. No pertinent surgical history.    No family history of bleeding disorders, wound healing problems or difficulty with anesthesia.   Social History  Social History        Social History  . Marital status: Single    Spouse name: N/A  . Number of children: N/A  . Years of education: N/A      Occupational History  . Not on file.   Social History Main Topics  . Smoking status: Never Smoker  . Smokeless tobacco: Never Used  . Alcohol use No  . Drug use: No  . Sexual activity: Not on file       Other Topics Concern  . Not on file      Social History Narrative  . No narrative on file       Current Outpatient Prescriptions:  .  cyclobenzaprine (FLEXERIL) 10 MG tablet, , Disp: , Rfl: 0 .  meclizine (ANTIVERT) 25 mg tablet, Take 25 mg by mouth., Disp: , Rfl:  .  naproxen (NAPROSYN) 500 MG tablet, , Disp: , Rfl: 0 .  acetaminophen-codeine (TYLENOL #3) 300-30 mg per tablet, Take 1-2 tablets by mouth every 4 (four) hours as needed for up to 5 days., Disp: 24 tablet, Rfl: 0 .  cephalexin 500 mg tablet, Take 500 mg by mouth 4 times daily for 10 days., Disp: 40 tablet, Rfl: 0  A complete ROS was performed with pertinent positives/negatives noted in the HPI. The remainder of the ROS are negative.    Physical Exam:    There were no vitals taken for this visit. Patient is trim and healthy.  She is breathing through her mouth and nose.  Internally, the septum  is corrugated and the turbinates are bulky.  Neck unremarkable. Lungs: Clear to auscultation Heart: Regular rate and rhythm without murmurs Abdomen: Soft, active Extremities: Normal configuration Neurologic: Symmetric, grossly intact.       Impression & Plans:   Preop check.  Plan: I discussed the surgery in detail including risks and complications.  Questions were answered and informed consent was obtained.  I sent in prescriptions for Tylenol 3 and Keflex.  I will see her back one day after surgery for removal of packing in 10 days after surgery for septal splint removal.   Lilyan Gilford, MD  07/18/6220

## 2018-03-18 ENCOUNTER — Encounter (HOSPITAL_BASED_OUTPATIENT_CLINIC_OR_DEPARTMENT_OTHER)
Admission: RE | Disposition: A | Discharge status: Home / Self Care | Payer: Self-pay | Source: Ambulatory Visit | Attending: Otolaryngology

## 2018-03-18 ENCOUNTER — Encounter (HOSPITAL_BASED_OUTPATIENT_CLINIC_OR_DEPARTMENT_OTHER): Payer: Self-pay | Admitting: Anesthesiology

## 2018-03-18 ENCOUNTER — Ambulatory Visit (HOSPITAL_BASED_OUTPATIENT_CLINIC_OR_DEPARTMENT_OTHER)
Admission: RE | Admit: 2018-03-18 | Discharge: 2018-03-18 | Disposition: A | Discharge status: Home / Self Care | Payer: BLUE CROSS/BLUE SHIELD | Source: Ambulatory Visit | Attending: Otolaryngology | Admitting: Otolaryngology

## 2018-03-18 ENCOUNTER — Ambulatory Visit (HOSPITAL_BASED_OUTPATIENT_CLINIC_OR_DEPARTMENT_OTHER): Payer: BLUE CROSS/BLUE SHIELD | Admitting: Anesthesiology

## 2018-03-18 DIAGNOSIS — Z885 Allergy status to narcotic agent status: Secondary | ICD-10-CM | POA: Diagnosis not present

## 2018-03-18 DIAGNOSIS — Z6832 Body mass index (BMI) 32.0-32.9, adult: Secondary | ICD-10-CM | POA: Diagnosis not present

## 2018-03-18 DIAGNOSIS — K219 Gastro-esophageal reflux disease without esophagitis: Secondary | ICD-10-CM | POA: Insufficient documentation

## 2018-03-18 DIAGNOSIS — J343 Hypertrophy of nasal turbinates: Secondary | ICD-10-CM | POA: Insufficient documentation

## 2018-03-18 DIAGNOSIS — J342 Deviated nasal septum: Secondary | ICD-10-CM | POA: Insufficient documentation

## 2018-03-18 DIAGNOSIS — Z880 Allergy status to penicillin: Secondary | ICD-10-CM | POA: Insufficient documentation

## 2018-03-18 DIAGNOSIS — E669 Obesity, unspecified: Secondary | ICD-10-CM | POA: Diagnosis not present

## 2018-03-18 DIAGNOSIS — Z79899 Other long term (current) drug therapy: Secondary | ICD-10-CM | POA: Diagnosis not present

## 2018-03-18 HISTORY — DX: Personal history of urinary calculi: Z87.442

## 2018-03-18 HISTORY — DX: Other seasonal allergic rhinitis: J30.2

## 2018-03-18 HISTORY — PX: NASAL SEPTOPLASTY W/ TURBINOPLASTY: SHX2070

## 2018-03-18 HISTORY — DX: Deviated nasal septum: J34.2

## 2018-03-18 SURGERY — SEPTOPLASTY, NOSE, WITH NASAL TURBINATE REDUCTION
Anesthesia: General | Site: Nose | Laterality: Bilateral

## 2018-03-18 MED ORDER — MIDAZOLAM HCL 2 MG/2ML IJ SOLN
INTRAMUSCULAR | Status: AC
  Filled 2018-03-18: qty 2

## 2018-03-18 MED ORDER — FENTANYL CITRATE (PF) 100 MCG/2ML IJ SOLN
50.0000 ug | INTRAMUSCULAR | Status: DC | PRN
  Administered 2018-03-18 (×2): 50 ug via INTRAVENOUS

## 2018-03-18 MED ORDER — BACITRACIN ZINC 500 UNIT/GM EX OINT
TOPICAL_OINTMENT | CUTANEOUS | Status: DC | PRN
  Administered 2018-03-18: 1 via TOPICAL

## 2018-03-18 MED ORDER — OXYMETAZOLINE HCL 0.05 % NA SOLN
NASAL | Status: DC | PRN
  Administered 2018-03-18: 1 via TOPICAL

## 2018-03-18 MED ORDER — EPINEPHRINE 30 MG/30ML IJ SOLN
INTRAMUSCULAR | Status: AC
Start: 2018-03-18 — End: ?
  Filled 2018-03-18: qty 1

## 2018-03-18 MED ORDER — ONDANSETRON HCL 4 MG/2ML IJ SOLN
INTRAMUSCULAR | Status: AC
  Filled 2018-03-18: qty 2

## 2018-03-18 MED ORDER — CEFAZOLIN SODIUM-DEXTROSE 2-4 GM/100ML-% IV SOLN
INTRAVENOUS | Status: AC
  Filled 2018-03-18: qty 100

## 2018-03-18 MED ORDER — PROMETHAZINE HCL 25 MG/ML IJ SOLN
INTRAMUSCULAR | Status: AC
  Filled 2018-03-18: qty 1

## 2018-03-18 MED ORDER — ROCURONIUM BROMIDE 50 MG/5ML IV SOSY
PREFILLED_SYRINGE | INTRAVENOUS | Status: AC
  Filled 2018-03-18: qty 5

## 2018-03-18 MED ORDER — LIDOCAINE 2% (20 MG/ML) 5 ML SYRINGE
INTRAMUSCULAR | Status: AC
  Filled 2018-03-18: qty 5

## 2018-03-18 MED ORDER — HYDROMORPHONE HCL 1 MG/ML IJ SOLN
0.2500 mg | INTRAMUSCULAR | Status: DC | PRN
  Administered 2018-03-18 (×2): 0.5 mg via INTRAVENOUS

## 2018-03-18 MED ORDER — PANTOPRAZOLE SODIUM 40 MG PO PACK: 40.0000 mg | PACK | Freq: Every day | ORAL | Status: DC

## 2018-03-18 MED ORDER — MEPERIDINE HCL 25 MG/ML IJ SOLN: 6.2500 mg | INTRAMUSCULAR | Status: DC | PRN

## 2018-03-18 MED ORDER — OXYMETAZOLINE HCL 0.05 % NA SOLN
2.0000 | NASAL | Status: AC | PRN
  Administered 2018-03-18 (×2): 2 via NASAL

## 2018-03-18 MED ORDER — ROCURONIUM BROMIDE 100 MG/10ML IV SOLN
INTRAVENOUS | Status: DC | PRN
  Administered 2018-03-18: 50 mg via INTRAVENOUS

## 2018-03-18 MED ORDER — DEXAMETHASONE SODIUM PHOSPHATE 4 MG/ML IJ SOLN
INTRAMUSCULAR | Status: DC | PRN
  Administered 2018-03-18: 10 mg via INTRAVENOUS

## 2018-03-18 MED ORDER — OXYMETAZOLINE HCL 0.05 % NA SOLN
NASAL | Status: AC
  Filled 2018-03-18: qty 15

## 2018-03-18 MED ORDER — HYDROMORPHONE HCL 1 MG/ML IJ SOLN
INTRAMUSCULAR | Status: AC
  Filled 2018-03-18: qty 0.5

## 2018-03-18 MED ORDER — LIDOCAINE HCL (CARDIAC) PF 100 MG/5ML IV SOSY
PREFILLED_SYRINGE | INTRAVENOUS | Status: DC | PRN
  Administered 2018-03-18: 80 mg via INTRAVENOUS

## 2018-03-18 MED ORDER — PROPOFOL 10 MG/ML IV BOLUS
INTRAVENOUS | Status: DC | PRN
  Administered 2018-03-18: 150 mg via INTRAVENOUS

## 2018-03-18 MED ORDER — LACTATED RINGERS IV SOLN
INTRAVENOUS | Status: DC
  Administered 2018-03-18: 08:00:00 via INTRAVENOUS

## 2018-03-18 MED ORDER — SILVER NITRATE-POT NITRATE 75-25 % EX MISC
CUTANEOUS | Status: AC
  Filled 2018-03-18: qty 1

## 2018-03-18 MED ORDER — BACITRACIN ZINC 500 UNIT/GM EX OINT
TOPICAL_OINTMENT | CUTANEOUS | Status: AC
  Filled 2018-03-18: qty 28.35

## 2018-03-18 MED ORDER — MIDAZOLAM HCL 2 MG/2ML IJ SOLN
1.0000 mg | INTRAMUSCULAR | Status: DC | PRN
  Administered 2018-03-18: 2 mg via INTRAVENOUS

## 2018-03-18 MED ORDER — SUGAMMADEX SODIUM 200 MG/2ML IV SOLN
INTRAVENOUS | Status: DC | PRN
  Administered 2018-03-18: 200 mg via INTRAVENOUS

## 2018-03-18 MED ORDER — SUGAMMADEX SODIUM 500 MG/5ML IV SOLN
INTRAVENOUS | Status: AC
  Filled 2018-03-18: qty 5

## 2018-03-18 MED ORDER — OXYCODONE HCL 5 MG/5ML PO SOLN: 5.0000 mg | Freq: Once | ORAL | Status: DC | PRN

## 2018-03-18 MED ORDER — LIDOCAINE-EPINEPHRINE 1 %-1:100000 IJ SOLN
INTRAMUSCULAR | Status: DC | PRN
  Administered 2018-03-18: 16.5 mL

## 2018-03-18 MED ORDER — CEFAZOLIN SODIUM-DEXTROSE 2-4 GM/100ML-% IV SOLN
2.0000 g | INTRAVENOUS | Status: AC
  Administered 2018-03-18: 2 g via INTRAVENOUS

## 2018-03-18 MED ORDER — OXYCODONE HCL 5 MG PO TABS: 5.0000 mg | ORAL_TABLET | Freq: Once | ORAL | Status: DC | PRN

## 2018-03-18 MED ORDER — LIDOCAINE-EPINEPHRINE 1 %-1:100000 IJ SOLN
INTRAMUSCULAR | Status: AC
  Filled 2018-03-18: qty 1

## 2018-03-18 MED ORDER — DEXAMETHASONE SODIUM PHOSPHATE 10 MG/ML IJ SOLN
INTRAMUSCULAR | Status: AC
  Filled 2018-03-18: qty 1

## 2018-03-18 MED ORDER — PROMETHAZINE HCL 25 MG/ML IJ SOLN
6.2500 mg | INTRAMUSCULAR | Status: DC | PRN
  Administered 2018-03-18: 12.5 mg via INTRAVENOUS

## 2018-03-18 MED ORDER — ONDANSETRON HCL 4 MG/2ML IJ SOLN
INTRAMUSCULAR | Status: DC | PRN
  Administered 2018-03-18: 4 mg via INTRAVENOUS

## 2018-03-18 MED ORDER — FENTANYL CITRATE (PF) 100 MCG/2ML IJ SOLN
INTRAMUSCULAR | Status: AC
  Filled 2018-03-18: qty 2

## 2018-03-18 MED ORDER — SCOPOLAMINE 1 MG/3DAYS TD PT72: 1.0000 | MEDICATED_PATCH | Freq: Once | TRANSDERMAL | Status: DC | PRN

## 2018-03-18 SURGICAL SUPPLY — 40 items
AIRWAY NASO PHAR 26FR 6.5 (TUBING)
AIRWAY NASOPHAR 26 6.5 (TUBING) IMPLANT
ATTRACTOMAT 16X20 MAGNETIC DRP (DRAPES) IMPLANT
CANISTER SUCT 1200ML W/VALVE (MISCELLANEOUS) ×3 IMPLANT
COAGULATOR SUCT 8FR VV (MISCELLANEOUS) ×3 IMPLANT
DECANTER SPIKE VIAL GLASS SM (MISCELLANEOUS) ×3 IMPLANT
DEPRESSOR TONGUE BLADE STERILE (MISCELLANEOUS) ×6 IMPLANT
DRSG NASOPORE 8CM (GAUZE/BANDAGES/DRESSINGS) IMPLANT
DRSG TELFA 3X8 NADH (GAUZE/BANDAGES/DRESSINGS) ×3 IMPLANT
ELECT REM PT RETURN 9FT ADLT (ELECTROSURGICAL) ×3
ELECTRODE REM PT RTRN 9FT ADLT (ELECTROSURGICAL) ×1 IMPLANT
GAUZE PACKING FOLDED 2  STR (GAUZE/BANDAGES/DRESSINGS) ×2
GAUZE PACKING FOLDED 2 STR (GAUZE/BANDAGES/DRESSINGS) ×1 IMPLANT
GLOVE BIOGEL PI IND STRL 7.0 (GLOVE) ×2 IMPLANT
GLOVE BIOGEL PI INDICATOR 7.0 (GLOVE) ×4
GLOVE ECLIPSE 6.5 STRL STRAW (GLOVE) ×3 IMPLANT
GLOVE ECLIPSE 8.0 STRL XLNG CF (GLOVE) ×6 IMPLANT
GOWN STRL REUS W/ TWL LRG LVL3 (GOWN DISPOSABLE) ×1 IMPLANT
GOWN STRL REUS W/ TWL XL LVL3 (GOWN DISPOSABLE) ×1 IMPLANT
GOWN STRL REUS W/TWL LRG LVL3 (GOWN DISPOSABLE) ×2
GOWN STRL REUS W/TWL XL LVL3 (GOWN DISPOSABLE) ×2
NEEDLE HYPO 25X1 1.5 SAFETY (NEEDLE) ×3 IMPLANT
NEEDLE SPNL 25GX3.5 QUINCKE BL (NEEDLE) ×3 IMPLANT
NS IRRIG 1000ML POUR BTL (IV SOLUTION) IMPLANT
PACK BASIN DAY SURGERY FS (CUSTOM PROCEDURE TRAY) ×3 IMPLANT
PACK ENT DAY SURGERY (CUSTOM PROCEDURE TRAY) ×3 IMPLANT
PATTIES SURGICAL .5 X3 (DISPOSABLE) ×3 IMPLANT
SHEET SILICONE 2X3 0.04 REINF (ENT DISPOSABLE) ×3 IMPLANT
SLEEVE SCD COMPRESS KNEE MED (MISCELLANEOUS) ×3 IMPLANT
SPONGE GAUZE 2X2 8PLY STER LF (GAUZE/BANDAGES/DRESSINGS) ×1
SPONGE GAUZE 2X2 8PLY STRL LF (GAUZE/BANDAGES/DRESSINGS) ×2 IMPLANT
SUT CHROMIC 3 0 PS 2 (SUTURE) IMPLANT
SUT CHROMIC 4 0 P 3 18 (SUTURE) ×3 IMPLANT
SUT ETHILON 3 0 PS 1 (SUTURE) ×3 IMPLANT
SUT PDS AB 4-0 RB1 27 (SUTURE) ×3 IMPLANT
SUT PLAIN 4 0 ~~LOC~~ 1 (SUTURE) IMPLANT
SUT VIC AB 3-0 FS2 27 (SUTURE) IMPLANT
TOWEL GREEN STERILE FF (TOWEL DISPOSABLE) ×6 IMPLANT
TRAY DSU PREP LF (CUSTOM PROCEDURE TRAY) ×3 IMPLANT
YANKAUER SUCT BULB TIP NO VENT (SUCTIONS) ×3 IMPLANT

## 2018-03-18 NOTE — Discharge Instructions (Signed)
°  Post Anesthesia Home Care Instructions  Activity: Get plenty of rest for the remainder of the day. A responsible individual must stay with you for 24 hours following the procedure.  For the next 24 hours, DO NOT: -Drive a car -Paediatric nurse -Drink alcoholic beverages -Take any medication unless instructed by your physician -Make any legal decisions or sign important papers.  Meals: Start with liquid foods such as gelatin or soup. Progress to regular foods as tolerated. Avoid greasy, spicy, heavy foods. If nausea and/or vomiting occur, drink only clear liquids until the nausea and/or vomiting subsides. Call your physician if vomiting continues.  Special Instructions/Symptoms: Your throat may feel dry or sore from the anesthesia or the breathing tube placed in your throat during surgery. If this causes discomfort, gargle with warm salt water. The discomfort should disappear within 24 hours.  If you had a scopolamine patch placed behind your ear for the management of post- operative nausea and/or vomiting:  1. The medication in the patch is effective for 72 hours, after which it should be removed.  Wrap patch in a tissue and discard in the trash. Wash hands thoroughly with soap and water. 2. You may remove the patch earlier than 72 hours if you experience unpleasant side effects which may include dry mouth, dizziness or visual disturbances. 3. Avoid touching the patch. Wash your hands with soap and water after contact with the patch.  Ice pack x 24 hrs as tolerated Keep head elevated 3-4 nights Change drip pad as often as needed I will give you nasal hygiene instructions when I see you in the office tomorrow Recheck 1 day for nasal pack removal.  Take a dose of pain medication before this visit. Advance diet as comfortable OK to shower beginning tomorrow No strenuous activity x 2 weeks please

## 2018-03-18 NOTE — Anesthesia Preprocedure Evaluation (Signed)
Anesthesia Evaluation  Patient identified by MRN, date of birth, ID band Patient awake    Reviewed: Allergy & Precautions, NPO status , Patient's Chart, lab work & pertinent test results  Airway Mallampati: II  TM Distance: >3 FB Neck ROM: Full    Dental no notable dental hx.    Pulmonary neg pulmonary ROS,    Pulmonary exam normal breath sounds clear to auscultation       Cardiovascular negative cardio ROS Normal cardiovascular exam Rhythm:Regular Rate:Normal     Neuro/Psych  Headaches, negative neurological ROS  negative psych ROS   GI/Hepatic negative GI ROS, Neg liver ROS, GERD  ,  Endo/Other  negative endocrine ROS  Renal/GU negative Renal ROS  negative genitourinary   Musculoskeletal negative musculoskeletal ROS (+)   Abdominal (+) + obese,   Peds negative pediatric ROS (+)  Hematology negative hematology ROS (+)   Anesthesia Other Findings   Reproductive/Obstetrics negative OB ROS                             Anesthesia Physical Anesthesia Plan  ASA: II  Anesthesia Plan: General   Post-op Pain Management:    Induction: Intravenous  PONV Risk Score and Plan: 3 and Ondansetron, Dexamethasone and Midazolam  Airway Management Planned: Oral ETT  Additional Equipment:   Intra-op Plan:   Post-operative Plan: Extubation in OR  Informed Consent: I have reviewed the patients History and Physical, chart, labs and discussed the procedure including the risks, benefits and alternatives for the proposed anesthesia with the patient or authorized representative who has indicated his/her understanding and acceptance.   Dental advisory given  Plan Discussed with: CRNA  Anesthesia Plan Comments:         Anesthesia Quick Evaluation

## 2018-03-18 NOTE — Anesthesia Procedure Notes (Signed)
Procedure Name: Intubation Date/Time: 03/18/2018 8:49 AM Performed by: Lyndee Leo, CRNA Pre-anesthesia Checklist: Patient identified, Emergency Drugs available, Suction available and Patient being monitored Patient Re-evaluated:Patient Re-evaluated prior to induction Oxygen Delivery Method: Circle system utilized Preoxygenation: Pre-oxygenation with 100% oxygen Induction Type: IV induction Ventilation: Mask ventilation without difficulty Laryngoscope Size: Miller and 2 Grade View: Grade I Tube type: Oral Rae Tube size: 7.0 mm Number of attempts: 1 Airway Equipment and Method: Stylet and Oral airway Placement Confirmation: ETT inserted through vocal cords under direct vision,  positive ETCO2 and breath sounds checked- equal and bilateral Secured at: 20 cm Tube secured with: Tape Dental Injury: Teeth and Oropharynx as per pre-operative assessment

## 2018-03-18 NOTE — Op Note (Signed)
03/18/2018  9:58 AM    Caryl Ada  [id number]   Pre-Op Dx:  Deviated Nasal Septum, Hypertrophic Inferior Turbinates  Post-op Dx: Same  Proc: Nasal Septoplasty, Bilateral SMR Inferior Turbinates   Surg:  Jodi Marble T MD  Anes:  GOT  EBL:  min  Comp:  none  Findings:  LEFTward septal deviation with bulky inferior turbinates bilateral.  Procedure: With the patient in a comfortable supine position,  general orotracheal anesthesia was induced without difficulty.     The patient received preoperative Afrin spray for topical decongestion and vasoconstriction.  Intravenous prophylactic antibiotics were administered.  At an appropriate level, the patient was placed in a semi-sitting position.  A saline moistened throat pack was placed.  Nasal vibrissae were trimmed.   Afrin  solution was applied on 0.5" x 3" cottonoids to both sides of the septal mucosa.   1% Xylocaine with 1:100,000 epinephrine, 9 cc's, was infiltrated into the anterior floor of the nose, into the nasal spine region, into the membranous columella, and finally into the submucoperichondrial plane of the septum on both sides.  Several minutes were allowed for this to take effect.  A sterile preparation and draping of the midface was accomplished in the standard fashion.  The materials were removed from the nose and observed to be intact and correct in number.  The nose was inspected with a headlight with the findings as described above.  A RIGHT hemitransfixion incision was sharply executed and carried down to the caudal edge of the quadrangular cartilage and continued to a floor incision.  An opposite small floor incision was sharply executed as well.   Floor tunnels were elevated on both sides, carried posteriorly, then medially, then brought forward along the vomer and maxillary crest.  The submucoperichondrial plane of the  RIGHT septum was dissected up to the dorsum of the nose, back onto the perpendicular  plate, and brought down and communicated with a floor tunnel and then forward along the maxillary crest.  The flap was generated intact.  The chondroethmoid junction was identified and opened with a Psychologist, educational.  The opposite submucoperiosteal plane of the perpendicular plate of the ethmoid  was elevated and carried down to the floor tunnel posteriorly.  The superior perpendicular plate was lysed with an open Jansen-Middleton forceps.  The inferior portion was dissected from the maxillary crest and vomer with a Cottle elevator.  The midportion was rocked free with a closed KeySpan forceps and then delivered.    The posterior inferior corner of the quadrangular cartilage was submucosally resected, including a cartilaginous tail up along the vomer.     A 2 mm strip of the inferior caudal strut was resected submucosally.    After mobilizing the septum adequately, and straightening it in the standard fashion,  The septum was secured to the nasal spine with a figure-of-eight 4-0 PDS suture.  A good straight midline configuration of the septum with good dorsal support was generated.  The septal tunnel was suctioned clear.  Hemostasis was observed.  The flaps were laid back down.  The incisions were closed with interrupted 4-0 chromic suture.  Just prior to completing the septoplasty, the inferior turbinates were each infiltrated with additional 1% Xylocaine with 1:100,000 epinephrine,  6 cc's total.  Upon completing the septoplasty, beginning on the RIGHT side, the inferior turbinate was inspected and infractured.  The anterior hood of the inferior turbinate was sharply lysed just behind the nasal valve.  The medial mucosa of the  inferior turbinate was incised in an  anterior upsloping fashion and a laterally based flap was developed from the turbinate bone.  Using angled turbinate scissors, turbinate bone and lateral mucosa were resected in a posterior downsloping fashion, taking much of the  anterior pole and leaving most of the posterior pole.  Bony spicules were submucosally dissected and removed.  The mucosal flap was laid back down and the turbinate was outfractured.  This completed one SMR inferior turbinate.  The opposite side was performed in identical fashion.  The cut mucosal edges were suction coagulated on both sides for hemostasis.  Again hemostasis was observed.  After completing both turbinate resections, 0.040" reinforced Silastic splints were fashioned, placed against the nasal septum for support, and secured thereto with a 3-0 Ethilon stitch.   Telfa packs impregnated with bacitracin ointment were placed between the septum and the inferior turbinates, one on each side, for hemostasis and support.     At this point the procedure was completed.  The pharynx was suctioned free and the throat pack was removed.   The patient was returned to anesthesia, awakened, extubated, and transferred to recovery in stable condition.  Dispo:   PACU to home  Plan: Ice, elevation, narcotic analgesia, prophylactic antibiotics for the duration of indwelling nasal foreign bodies.  We will remove the nasal packing In one day, the septal splints in 10 days.  Return to work or school in 10 days, strenuous activities in two weeks.  Tyson Alias MD

## 2018-03-18 NOTE — Interval H&P Note (Signed)
History and Physical Interval Note:  03/18/2018 8:09 AM  [name] [name]  has presented today for surgery, with the diagnosis of DEVIATED NASAL SEPTUM, HYPERTROPHIC INFERIOR TURBINATES  The various methods of treatment have been discussed with the patient and family. After consideration of risks, benefits and other options for treatment, the patient has consented to  Procedure(s): BILATERAL NASAL SEPTOPLASTY WITH TURBINATE REDUCTION (Bilateral) as a surgical intervention .  The patient's history has been re-reviewed, patient re-examined, no change in status, stable for surgery.  I have re-reviewed the patient's chart and labs.  Questions were answered to the patient's satisfaction.     Jodi Marble

## 2018-03-18 NOTE — Transfer of Care (Signed)
Immediate Anesthesia Transfer of Care Note  Patient: [name] [name]  Procedure(s) Performed: BILATERAL NASAL SEPTOPLASTY WITH TURBINATE REDUCTION (Bilateral Nose)  Patient Location: PACU  Anesthesia Type:General  Level of Consciousness: awake, sedated and patient cooperative  Airway & Oxygen Therapy: Patient Spontanous Breathing and aerosol face mask  Post-op Assessment: Report given to RN and Post -op Vital signs reviewed and stable  Post vital signs: Reviewed and stable  Last Vitals:  Vitals Value Taken Time  BP 140/82 03/18/2018  9:56 AM  Temp    Pulse 79 03/18/2018  9:57 AM  Resp 16 03/18/2018  9:57 AM  SpO2 100 % 03/18/2018  9:57 AM  Vitals shown include unvalidated device data.  Last Pain:  Vitals:   03/18/18 0809  TempSrc: Oral  PainSc: 0-No pain         Complications: No apparent anesthesia complications

## 2018-03-18 NOTE — Anesthesia Postprocedure Evaluation (Signed)
Anesthesia Post Note  Patient: [name] [name] Begin  Procedure(s) Performed: BILATERAL NASAL SEPTOPLASTY WITH TURBINATE REDUCTION (Bilateral Nose)     Patient location during evaluation: PACU Anesthesia Type: General Level of consciousness: awake and alert Pain management: pain level controlled Vital Signs Assessment: post-procedure vital signs reviewed and stable Respiratory status: spontaneous breathing, nonlabored ventilation and respiratory function stable Cardiovascular status: blood pressure returned to baseline and stable Postop Assessment: no apparent nausea or vomiting Anesthetic complications: no    Last Vitals:  Vitals:   03/18/18 1030 03/18/18 1045  BP: 126/78 (!) 128/95  Pulse: 72 81  Resp: 16 14  Temp:    SpO2: 100% 97%    Last Pain:  Vitals:   03/18/18 1045  TempSrc:   PainSc: 3                  Lynda Rainwater

## 2018-03-19 ENCOUNTER — Encounter (HOSPITAL_BASED_OUTPATIENT_CLINIC_OR_DEPARTMENT_OTHER): Payer: Self-pay | Admitting: Otolaryngology

## 2018-03-27 ENCOUNTER — Ambulatory Visit: Payer: BLUE CROSS/BLUE SHIELD | Admitting: Neurology

## 2018-03-27 ENCOUNTER — Telehealth: Payer: Self-pay | Admitting: *Deleted

## 2018-03-27 ENCOUNTER — Encounter: Payer: Self-pay | Admitting: *Deleted

## 2018-03-27 ENCOUNTER — Encounter

## 2018-03-27 NOTE — Telephone Encounter (Signed)
Pt no showed her new pt appt on 03/27/2018 @ 7:30 AM.

## 2018-03-28 ENCOUNTER — Encounter: Payer: Self-pay | Admitting: Neurology

## 2020-03-11 ENCOUNTER — Other Ambulatory Visit: Payer: Self-pay

## 2020-12-01 ENCOUNTER — Encounter: Payer: Self-pay | Admitting: *Deleted

## 2021-02-21 ENCOUNTER — Emergency Department (HOSPITAL_BASED_OUTPATIENT_CLINIC_OR_DEPARTMENT_OTHER): Payer: 59

## 2021-02-21 ENCOUNTER — Other Ambulatory Visit: Payer: Self-pay

## 2021-02-21 ENCOUNTER — Emergency Department (HOSPITAL_BASED_OUTPATIENT_CLINIC_OR_DEPARTMENT_OTHER)
Admission: EM | Admit: 2021-02-21 | Discharge: 2021-02-22 | Disposition: A | Discharge status: Home / Self Care | Payer: 59 | Source: Home / Self Care | Attending: Emergency Medicine | Admitting: Emergency Medicine

## 2021-02-21 ENCOUNTER — Encounter (HOSPITAL_BASED_OUTPATIENT_CLINIC_OR_DEPARTMENT_OTHER): Payer: Self-pay | Admitting: *Deleted

## 2021-02-21 DIAGNOSIS — Z880 Allergy status to penicillin: Secondary | ICD-10-CM | POA: Diagnosis not present

## 2021-02-21 DIAGNOSIS — N200 Calculus of kidney: Secondary | ICD-10-CM

## 2021-02-21 DIAGNOSIS — K219 Gastro-esophageal reflux disease without esophagitis: Secondary | ICD-10-CM | POA: Insufficient documentation

## 2021-02-21 DIAGNOSIS — Z87442 Personal history of urinary calculi: Secondary | ICD-10-CM | POA: Diagnosis not present

## 2021-02-21 DIAGNOSIS — R1032 Left lower quadrant pain: Secondary | ICD-10-CM | POA: Diagnosis present

## 2021-02-21 DIAGNOSIS — N132 Hydronephrosis with renal and ureteral calculous obstruction: Secondary | ICD-10-CM | POA: Diagnosis not present

## 2021-02-21 LAB — COMPREHENSIVE METABOLIC PANEL
ALT: 14 U/L (ref 0–44)
AST: 14 U/L — ABNORMAL LOW (ref 15–41)
Albumin: 4.2 g/dL (ref 3.5–5.0)
Alkaline Phosphatase: 50 U/L (ref 38–126)
Anion gap: 9 (ref 5–15)
BUN: 10 mg/dL (ref 6–20)
CO2: 25 mmol/L (ref 22–32)
Calcium: 9.3 mg/dL (ref 8.9–10.3)
Chloride: 103 mmol/L (ref 98–111)
Creatinine, Ser: 0.9 mg/dL (ref 0.44–1.00)
GFR, Estimated: >60 mL/min (ref >60)
Glucose, Bld: 129 mg/dL — ABNORMAL HIGH (ref 70–99)
Potassium: 3.8 mmol/L (ref 3.5–5.1)
Sodium: 137 mmol/L (ref 135–145)
Total Bilirubin: 0.6 mg/dL (ref 0.3–1.2)
Total Protein: 6.7 g/dL (ref 6.5–8.1)

## 2021-02-21 LAB — URINALYSIS, ROUTINE W REFLEX MICROSCOPIC
Bilirubin Urine: NEGATIVE
Glucose, UA: NEGATIVE mg/dL
Ketones, ur: NEGATIVE mg/dL
Nitrite: NEGATIVE
Protein, ur: NEGATIVE mg/dL
RBC / HPF: >50 RBC/hpf — ABNORMAL HIGH (ref 0–5)
Specific Gravity, Urine: 1.014 (ref 1.005–1.030)
pH: 8 (ref 5.0–8.0)

## 2021-02-21 LAB — CBC
HCT: 39 % (ref 36.0–46.0)
Hemoglobin: 13.9 g/dL (ref 12.0–15.0)
MCH: 31.4 pg (ref 26.0–34.0)
MCHC: 35.6 g/dL (ref 30.0–36.0)
MCV: 88.2 fL (ref 80.0–100.0)
Platelets: 195 10*3/uL (ref 150–400)
RBC: 4.42 MIL/uL (ref 3.87–5.11)
RDW: 12.5 % (ref 11.5–15.5)
WBC: 8.2 10*3/uL (ref 4.0–10.5)
nRBC: 0 % (ref 0.0–0.2)

## 2021-02-21 LAB — PREGNANCY, URINE: Preg Test, Ur: NEGATIVE

## 2021-02-21 LAB — LIPASE, BLOOD: Lipase: 11 U/L (ref 11–51)

## 2021-02-21 MED ORDER — OXYCODONE HCL 5 MG PO TABS
5.0000 mg | ORAL_TABLET | Freq: Once | ORAL | Status: AC
Start: 2021-02-22 — End: 2021-02-22
  Administered 2021-02-22: 5 mg via ORAL
  Filled 2021-02-21: qty 1

## 2021-02-21 MED ORDER — NAPROXEN 250 MG PO TABS
500.0000 mg | ORAL_TABLET | Freq: Once | ORAL | Status: AC
  Administered 2021-02-22: 500 mg via ORAL
  Filled 2021-02-21: qty 2

## 2021-02-21 NOTE — ED Triage Notes (Signed)
  After eating today (2:30 pm) pt had onset of severe left abd pain. Pain continues, deines urinary symptoms. N/V with pain

## 2021-02-21 NOTE — ED Provider Notes (Signed)
[facility] Emergency Department Provider Note MRN:  [id number]  Arrival date & time: 02/22/21     Chief Complaint   Abdominal Pain   History of Present Illness   [name] [name] is a 45 y.o. year-old female with a history of kidney stones presenting to the ED with chief complaint of flank pain.  Location: Left flank and left lower quadrant Duration: 5 hours Onset: Sudden Timing: Constant Description: Sharp Severity: 6 out of 10 Exacerbating/Alleviating Factors: None, cannot get comfortable Associated Symptoms: Nausea, lightheadedness Pertinent Negatives: Denies fever, no dysuria, no hematuria, no vaginal bleeding or discharge, no chest pain or shortness of breath   Review of Systems  A complete 10 system review of systems was obtained and all systems are negative except as noted in the HPI and PMH.   Patient's Health History    Past Medical History:  Diagnosis Date   Anemia    Deviated septum    GERD (gastroesophageal reflux disease)    History of kidney stones    last stone 6 months ago    Migraine    Seasonal allergies     Past Surgical History:  Procedure Laterality Date   ADENOIDECTOMY     ESOPHAGOGASTRODUODENOSCOPY N/A 11/17/2013   Procedure: ESOPHAGOGASTRODUODENOSCOPY (EGD);  Surgeon: Milus Banister, MD;  Location: [place];  Service: Endoscopy;  Laterality: N/A;   ESOPHAGOGASTRODUODENOSCOPY N/A 10/15/2015   Procedure: ESOPHAGOGASTRODUODENOSCOPY (EGD);  Surgeon: Gatha Mayer, MD;  Location: [facility];  Service: Endoscopy;  Laterality: N/A;   NASAL SEPTOPLASTY W/ TURBINOPLASTY Bilateral 03/18/2018   Procedure: BILATERAL NASAL SEPTOPLASTY WITH TURBINATE REDUCTION;  Surgeon: Jodi Marble, MD;  Location: [place];  Service: ENT;  Laterality: Bilateral;   TONSILLECTOMY  1991   TUBAL LIGATION  2005    Family History  Problem Relation Age of Onset   Asthma Mother    Diabetes Mother    Colon polyps Mother     Esophageal cancer Maternal Grandfather    Ovarian cancer Maternal Grandmother    Diabetes Maternal Grandmother     Social History   Socioeconomic History   Marital status: Single    Spouse name: Not on file   Number of children: Not on file   Years of education: Not on file   Highest education level: Not on file  Occupational History   Not on file  Tobacco Use   Smoking status: Never   Smokeless tobacco: Never  Substance and Sexual Activity   Alcohol use: No   Drug use: No   Sexual activity: Not on file  Other Topics Concern   Not on file  Social History Narrative   ** Merged History Encounter **       Social Determinants of Health   Financial Resource Strain: Not on file  Food Insecurity: Not on file  Transportation Needs: Not on file  Physical Activity: Not on file  Stress: Not on file  Social Connections: Not on file  Intimate Partner Violence: Not on file     Physical Exam   Vitals:   02/21/21 2131 02/21/21 2305  BP: (!) 134/95 (!) 143/86  Pulse: 91 73  Resp: 20 20  Temp:    SpO2: 98% 100%    CONSTITUTIONAL: Well-appearing, NAD NEURO:  Alert and oriented x 3, no focal deficits EYES:  eyes equal and reactive ENT/NECK:  no LAD, no JVD CARDIO: Regular rate, well-perfused, normal S1 and S2 PULM:  CTAB no wheezing or rhonchi GI/GU:  normal  bowel sounds, non-distended, mild left lower quadrant tenderness to palpation MSK/SPINE:  No gross deformities, no edema SKIN:  no rash, atraumatic PSYCH:  Appropriate speech and behavior  *Additional and/or pertinent findings included in MDM below  Diagnostic and Interventional Summary    EKG Interpretation  Date/Time:    Ventricular Rate:    PR Interval:    QRS Duration:   QT Interval:    QTC Calculation:   R Axis:     Text Interpretation:         Labs Reviewed  COMPREHENSIVE METABOLIC PANEL - Abnormal; Notable for the following components:      Result Value   Glucose, Bld 129 (*)    AST 14 (*)     All other components within normal limits  URINALYSIS, ROUTINE W REFLEX MICROSCOPIC - Abnormal; Notable for the following components:   Hgb urine dipstick LARGE (*)    Leukocytes,Ua TRACE (*)    RBC / HPF >50 (*)    Bacteria, UA RARE (*)    All other components within normal limits  LIPASE, BLOOD  CBC  PREGNANCY, URINE    CT RENAL STONE STUDY  Final Result      Medications  oxyCODONE (Oxy IR/ROXICODONE) immediate release tablet 5 mg (5 mg Oral Given 02/22/21 0003)  naproxen (NAPROSYN) tablet 500 mg (500 mg Oral Given 02/22/21 0003)     Procedures  /  Critical Care Procedures  ED Course and Medical Decision Making  I have reviewed the triage vital signs, the nursing notes, and pertinent available records from the EMR.  Listed above are laboratory and imaging tests that I personally ordered, reviewed, and interpreted and then considered in my medical decision making (see below for details).  Differential diagnosis includes kidney stone, diverticulitis, ovarian cyst, doubt torsion.  Labs reassuring, urinalysis with large blood supporting the kidney stone diagnosis.  CT is pending.     CT reveals 10 mm stone.  Patient has normal kidney function, no evidence of UTI, pain is well controlled.  Will message the urologist on-call to help facilitate close follow-up, patient will call the number provided in the morning.  Appropriate for discharge.  Barth Kirks. Sedonia Small, [place] [email or web address]  Final Clinical Impressions(s) / ED Diagnoses     ICD-10-CM   1. Kidney stone  N20.0       ED Discharge Orders          Ordered    oxyCODONE (ROXICODONE) 5 MG immediate release tablet  Every 4 hours PRN        02/22/21 0033    tamsulosin (FLOMAX) 0.4 MG CAPS capsule  Daily        02/22/21 0033             Discharge Instructions Discussed with and Provided to Patient:     Discharge Instructions      You were evaluated in the  Emergency Department and after careful evaluation, we did not find any emergent condition requiring admission or further testing in the hospital.  Your exam/testing today was overall reassuring.  Symptoms seem to be due to a kidney stone.  The kidney stone is large and you may not be able to pass the stone on your own.  We are referring you to the urologist in town.  Please call the office number provided to set up a follow-up appointment.  Take the Flomax once daily to help pass the stone.  Recommend Tylenol 1000 mg  every 4-6 hours and/or Motrin 600 mg every 4-6 hours for pain.  You can use the oxycodone medication for more significant pain.  Please return to the Emergency Department if you experience any worsening of your condition.  Thank you for allowing [place] to be a part of your care.         Maudie Flakes, MD 02/22/21 [phone]

## 2021-02-22 ENCOUNTER — Ambulatory Visit (HOSPITAL_COMMUNITY): Payer: 59 | Admitting: Certified Registered"

## 2021-02-22 ENCOUNTER — Ambulatory Visit (HOSPITAL_COMMUNITY): Payer: 59

## 2021-02-22 ENCOUNTER — Encounter (HOSPITAL_COMMUNITY): Payer: Self-pay | Admitting: Urology

## 2021-02-22 ENCOUNTER — Encounter (HOSPITAL_COMMUNITY)
Admission: AD | Disposition: A | Discharge status: Home / Self Care | Payer: Self-pay | Source: Ambulatory Visit | Attending: Urology

## 2021-02-22 ENCOUNTER — Other Ambulatory Visit: Payer: Self-pay | Admitting: Urology

## 2021-02-22 ENCOUNTER — Ambulatory Visit (HOSPITAL_COMMUNITY)
Admission: AD | Admit: 2021-02-22 | Discharge: 2021-02-22 | Disposition: A | Discharge status: Home / Self Care | Payer: 59 | Source: Ambulatory Visit | Attending: Urology | Admitting: Urology

## 2021-02-22 DIAGNOSIS — N201 Calculus of ureter: Secondary | ICD-10-CM

## 2021-02-22 DIAGNOSIS — Z87442 Personal history of urinary calculi: Secondary | ICD-10-CM | POA: Insufficient documentation

## 2021-02-22 DIAGNOSIS — N132 Hydronephrosis with renal and ureteral calculous obstruction: Secondary | ICD-10-CM | POA: Insufficient documentation

## 2021-02-22 DIAGNOSIS — Z880 Allergy status to penicillin: Secondary | ICD-10-CM | POA: Insufficient documentation

## 2021-02-22 HISTORY — PX: CYSTOSCOPY WITH RETROGRADE PYELOGRAM, URETEROSCOPY AND STENT PLACEMENT: SHX5789

## 2021-02-22 LAB — PREGNANCY, URINE: Preg Test, Ur: NEGATIVE

## 2021-02-22 SURGERY — CYSTOURETEROSCOPY, WITH RETROGRADE PYELOGRAM AND STENT INSERTION
Anesthesia: General | Site: Ureter | Laterality: Left

## 2021-02-22 MED ORDER — TAMSULOSIN HCL 0.4 MG PO CAPS: 0.4000 mg | ORAL_CAPSULE | Freq: Every day | ORAL | 0 refills | Status: DC

## 2021-02-22 MED ORDER — ACETAMINOPHEN 500 MG PO TABS: 1000.0000 mg | ORAL_TABLET | Freq: Once | ORAL | Status: DC | PRN

## 2021-02-22 MED ORDER — MIDAZOLAM HCL 2 MG/2ML IJ SOLN
INTRAMUSCULAR | Status: DC | PRN
  Administered 2021-02-22: 2 mg via INTRAVENOUS

## 2021-02-22 MED ORDER — PROPOFOL 10 MG/ML IV BOLUS
INTRAVENOUS | Status: DC | PRN
  Administered 2021-02-22: 160 mg via INTRAVENOUS

## 2021-02-22 MED ORDER — DEXAMETHASONE SODIUM PHOSPHATE 10 MG/ML IJ SOLN
INTRAMUSCULAR | Status: DC | PRN
  Administered 2021-02-22: 10 mg via INTRAVENOUS

## 2021-02-22 MED ORDER — IOHEXOL 300 MG/ML  SOLN
INTRAMUSCULAR | Status: DC | PRN
  Administered 2021-02-22: 10 mL

## 2021-02-22 MED ORDER — MIDAZOLAM HCL 2 MG/2ML IJ SOLN
INTRAMUSCULAR | Status: AC
  Filled 2021-02-22: qty 2

## 2021-02-22 MED ORDER — FENTANYL CITRATE (PF) 100 MCG/2ML IJ SOLN: 25.0000 ug | INTRAMUSCULAR | Status: DC | PRN

## 2021-02-22 MED ORDER — OXYCODONE HCL 5 MG PO TABS
5.0000 mg | ORAL_TABLET | ORAL | 0 refills | Status: DC | PRN
Start: 2021-02-22 — End: 2021-03-07

## 2021-02-22 MED ORDER — PHENYLEPHRINE HCL-NACL 20-0.9 MG/250ML-% IV SOLN
INTRAVENOUS | Status: DC | PRN
  Administered 2021-02-22: 15 ug/min via INTRAVENOUS

## 2021-02-22 MED ORDER — ACETAMINOPHEN 10 MG/ML IV SOLN: 1000.0000 mg | Freq: Once | INTRAVENOUS | Status: DC | PRN

## 2021-02-22 MED ORDER — OXYCODONE HCL 5 MG PO TABS: 5.0000 mg | ORAL_TABLET | Freq: Once | ORAL | Status: DC | PRN

## 2021-02-22 MED ORDER — LIDOCAINE 2% (20 MG/ML) 5 ML SYRINGE
INTRAMUSCULAR | Status: DC | PRN
  Administered 2021-02-22: 40 mg via INTRAVENOUS

## 2021-02-22 MED ORDER — OXYCODONE HCL 5 MG/5ML PO SOLN
5.0000 mg | Freq: Once | ORAL | Status: DC | PRN
Start: 2021-02-22 — End: 2021-02-22

## 2021-02-22 MED ORDER — FENTANYL CITRATE (PF) 250 MCG/5ML IJ SOLN
INTRAMUSCULAR | Status: AC
  Filled 2021-02-22: qty 5

## 2021-02-22 MED ORDER — ACETAMINOPHEN 160 MG/5ML PO SOLN: 1000.0000 mg | Freq: Once | ORAL | Status: DC | PRN

## 2021-02-22 MED ORDER — STERILE WATER FOR IRRIGATION IR SOLN
Status: DC | PRN
  Administered 2021-02-22: 3000 mL

## 2021-02-22 MED ORDER — ONDANSETRON HCL 4 MG/2ML IJ SOLN
INTRAMUSCULAR | Status: DC | PRN
Start: 2021-02-22 — End: 2021-02-22
  Administered 2021-02-22: 4 mg via INTRAVENOUS

## 2021-02-22 MED ORDER — LACTATED RINGERS IV SOLN: INTRAVENOUS | Status: DC

## 2021-02-22 MED ORDER — FENTANYL CITRATE (PF) 250 MCG/5ML IJ SOLN
INTRAMUSCULAR | Status: DC | PRN
  Administered 2021-02-22 (×5): 50 ug via INTRAVENOUS

## 2021-02-22 MED ORDER — CIPROFLOXACIN IN D5W 400 MG/200ML IV SOLN
400.0000 mg | INTRAVENOUS | Status: AC
  Administered 2021-02-22: 400 mg via INTRAVENOUS
  Filled 2021-02-22: qty 200

## 2021-02-22 MED ORDER — CHLORHEXIDINE GLUCONATE 0.12 % MT SOLN
15.0000 mL | Freq: Once | OROMUCOSAL | Status: AC
  Administered 2021-02-22: 15 mL via OROMUCOSAL

## 2021-02-22 SURGICAL SUPPLY — 23 items
BAG URO CATCHER STRL LF (MISCELLANEOUS) ×2 IMPLANT
BASKET ZERO TIP NITINOL 2.4FR (BASKET) IMPLANT
BSKT STON RTRVL ZERO TP 2.4FR (BASKET)
BULB IRRIG PATHFIND (MISCELLANEOUS) ×2 IMPLANT
CATH URET 5FR 28IN OPEN ENDED (CATHETERS) IMPLANT
CLOTH BEACON ORANGE TIMEOUT ST (SAFETY) ×2 IMPLANT
GLOVE SURG ENC TEXT LTX SZ7.5 (GLOVE) ×2 IMPLANT
GOWN STRL REUS W/TWL XL LVL3 (GOWN DISPOSABLE) ×2 IMPLANT
GUIDEWIRE ANG ZIPWIRE 038X150 (WIRE) IMPLANT
GUIDEWIRE STR DUAL SENSOR (WIRE) ×2 IMPLANT
IV NS 1000ML (IV SOLUTION) ×2
IV NS 1000ML BAXH (IV SOLUTION) ×1 IMPLANT
KIT TURNOVER KIT A (KITS) ×2 IMPLANT
LASER FIB FLEXIVA PULSE ID 365 (Laser) IMPLANT
MANIFOLD NEPTUNE II (INSTRUMENTS) ×2 IMPLANT
PACK CYSTO (CUSTOM PROCEDURE TRAY) ×2 IMPLANT
SHEATH URETERAL 12FRX35CM (MISCELLANEOUS) IMPLANT
STENT URET 6FRX26 CONTOUR (STENTS) ×2 IMPLANT
SYR 20ML LL LF (SYRINGE) ×2 IMPLANT
TRACTIP FLEXIVA PULS ID 200XHI (Laser) IMPLANT
TRACTIP FLEXIVA PULSE ID 200 (Laser)
TUBING CONNECTING 10 (TUBING) ×2 IMPLANT
TUBING UROLOGY SET (TUBING) ×2 IMPLANT

## 2021-02-22 NOTE — Anesthesia Procedure Notes (Signed)
Date/Time: 02/22/2021 3:25 PM Performed by: Cynda Familia, CRNA Oxygen Delivery Method: Simple face mask Placement Confirmation: positive ETCO2 and breath sounds checked- equal and bilateral Dental Injury: Teeth and Oropharynx as per pre-operative assessment

## 2021-02-22 NOTE — Anesthesia Preprocedure Evaluation (Signed)
Anesthesia Evaluation  Patient identified by MRN, date of birth, ID band Patient awake    Reviewed: Allergy & Precautions, NPO status , Patient's Chart, lab work & pertinent test results  History of Anesthesia Complications Negative for: history of anesthetic complications  Airway Mallampati: III  TM Distance: >3 FB Neck ROM: Full    Dental  (+) Dental Advisory Given, Teeth Intact   Pulmonary neg pulmonary ROS,    breath sounds clear to auscultation       Cardiovascular negative cardio ROS   Rhythm:Regular     Neuro/Psych  Headaches, neg Seizures negative psych ROS   GI/Hepatic Neg liver ROS, GERD  Controlled,  Endo/Other  negative endocrine ROS  Renal/GU Renal diseaseLab Results      Component                Value               Date                      CREATININE               0.90                02/21/2021           Lab Results      Component                Value               Date                      K                        3.8                 02/21/2021                Musculoskeletal   Abdominal   Peds  Hematology negative hematology ROS (+) Lab Results      Component                Value               Date                      WBC                      8.2                 02/21/2021                HGB                      13.9                02/21/2021                HCT                      39.0                02/21/2021                MCV  88.2                02/21/2021                PLT                      195                 02/21/2021              Anesthesia Other Findings   Reproductive/Obstetrics negative OB ROS Lab Results      Component                Value               Date                      PREGTESTUR               NEGATIVE            02/21/2021                                        Anesthesia Physical Anesthesia Plan  ASA:  1  Anesthesia Plan: General   Post-op Pain Management:    Induction: Intravenous  PONV Risk Score and Plan: 3 and Ondansetron and Dexamethasone  Airway Management Planned: LMA and Oral ETT  Additional Equipment: None  Intra-op Plan:   Post-operative Plan: Extubation in OR  Informed Consent: I have reviewed the patients History and Physical, chart, labs and discussed the procedure including the risks, benefits and alternatives for the proposed anesthesia with the patient or authorized representative who has indicated his/her understanding and acceptance.     Dental advisory given  Plan Discussed with: CRNA and Anesthesiologist  Anesthesia Plan Comments:         Anesthesia Quick Evaluation

## 2021-02-22 NOTE — Op Note (Signed)
Preoperative diagnosis:  1.  Left proximal ureteral calculus  Postoperative diagnosis: 1.  Same  Procedure(s): 1.  Cystoscopy, left retrograde pyelogram with intraoperative interpretation, left JJ stent insertion  Surgeon: Dr. Harold Barban  Anesthesia: General  Complications: None  EBL: Minimal  Specimens: None  Disposition of specimens: Not applicable  Intraoperative findings: 1 cm left proximal ureteral calculus.  6 [place] by 26 cm Percuflex plus soft contour stent placed without difficulty.  Hydronephrotic flow of urine through and around the stent noted.  Urine was clear  Indication: Patient is a 45 year old white female presented with acute onset left-sided flank pain.  She found on CT urogram at 1 cm proximal left ureteral calculus with obstruction and hydronephrosis.  She presents at this time and go cystoscopy and insertion of left JJ stent  Description of procedure:  After obtaining informed consent of the patient she was taken major cystoscopy suite placed under general anesthesia.  Placed in dorsolithotomy position genitalia prepped and draped in usual sterile fashion.  Proper pause and timeout performed prior to starting the case.  88 Fransico was advanced in the bladder without difficulty.  A 5 French open tip cath was utilized to cannulate the left ureteral orifice and retrograde pyelogram confirmed proximal filling defect and stone could be seen on preliminary scout film as well 1 cm calcification noted.  This was around L4 on the left.  Subsequently passed a sensor wire up the left ureter and advanced under fluoroscopy up to the level of stone and easily passed into the left renal pelvis.  Open tip catheter was removed leaving the guidewire in place.  A 6 French by 26 cm Percuflex plus soft Contour stent was placed leaving a proximal coil in the renal pelvis and a distal coil in the bladder.  There was hydronephrotic flow of clear urine noted through and around the  stent.  Interestingly the stone was easily seen on scout film as it was densely calcified but after placement of the stent the stone was difficult to visualize along the stent.  Bladder was emptied the procedure was terminated.  She was awakened anesthesia and taken back to the recovery in stable condition.  No immediate complication from the procedure.

## 2021-02-22 NOTE — Transfer of Care (Signed)
Immediate Anesthesia Transfer of Care Note  Patient: [name] [name]  Procedure(s) Performed: CYSTOSCOPY WITH RETROGRADE PYELOGRAM, URETEROSCOPY AND STENT PLACEMENT (Left: Ureter)  Patient Location: PACU  Anesthesia Type:General  Level of Consciousness: awake and alert   Airway & Oxygen Therapy: Patient Spontanous Breathing and Patient connected to face mask oxygen  Post-op Assessment: Report given to RN and Post -op Vital signs reviewed and stable  Post vital signs: Reviewed and stable  Last Vitals:  Vitals Value Taken Time  BP 113/59 02/22/21 1533  Temp 36.8 C 02/22/21 1533  Pulse 66 02/22/21 1535  Resp 14 02/22/21 1535  SpO2 100 % 02/22/21 1535  Vitals shown include unvalidated device data.  Last Pain:  Vitals:   02/22/21 1425  TempSrc: Oral  PainSc:       Patients Stated Pain Goal: 5 ([id number])  Complications: No notable events documented.

## 2021-02-22 NOTE — Interval H&P Note (Signed)
History and Physical Interval Note:  02/22/2021 1:58 PM  [name] [name]  has presented today for surgery, with the diagnosis of LEFT URETERAL STONE.  The various methods of treatment have been discussed with the patient and family. After consideration of risks, benefits and other options for treatment, the patient has consented to  Procedure(s): [place], URETEROSCOPY AND STENT PLACEMENT (Left) as a surgical intervention.  The patient's history has been reviewed, patient examined, no change in status, stable for surgery.  I have reviewed the patient's chart and labs.  Questions were answered to the patient's satisfaction.     Remi Haggard

## 2021-02-22 NOTE — Anesthesia Procedure Notes (Signed)
Procedure Name: LMA Insertion Date/Time: 02/22/2021 3:03 PM Performed by: Cynda Familia, CRNA Pre-anesthesia Checklist: Patient identified, Emergency Drugs available, Suction available and Patient being monitored Patient Re-evaluated:Patient Re-evaluated prior to induction Oxygen Delivery Method: Circle System Utilized Preoxygenation: Pre-oxygenation with 100% oxygen Induction Type: IV induction Ventilation: Mask ventilation without difficulty LMA: LMA inserted and LMA with gastric port inserted LMA Size: 4.0 Tube type: Oral Number of attempts: 1 Placement Confirmation: positive ETCO2 Tube secured with: Tape Dental Injury: Teeth and Oropharynx as per pre-operative assessment  Comments: Smooth IV induction Moser- LMA insertion AM CRNA atraumatic-- teeth and mouth as preop-- bilat BS

## 2021-02-22 NOTE — Discharge Instructions (Addendum)
You were evaluated in the Emergency Department and after careful evaluation, we did not find any emergent condition requiring admission or further testing in the hospital.  Your exam/testing today was overall reassuring.  Symptoms seem to be due to a kidney stone.  The kidney stone is large and you may not be able to pass the stone on your own.  We are referring you to the urologist in town.  Please call the office number provided to set up a follow-up appointment.  Take the Flomax once daily to help pass the stone.  Recommend Tylenol 1000 mg every 4-6 hours and/or Motrin 600 mg every 4-6 hours for pain.  You can use the oxycodone medication for more significant pain.  Please return to the Emergency Department if you experience any worsening of your condition.  Thank you for allowing [place] to be a part of your care.

## 2021-02-22 NOTE — H&P (Addendum)
Acute Kidney Stone  HPI: [name] [name] is a 45 year-old female patient who is here for further eval and management of kidney stones.  She was diagnosed with a kidney stone on approximately 02/20/2021. The patient presented to [facility] with symptoms of a kidney stone.   Her pain started about approximately 02/20/2021. The pain is on the left side.   Abdomen/Pelvic CT: 8m left proximal ureteral stone. The patient underwent No imaging prior to today's appointment.   The patient relates initially having nausea, flank pain, voiding symptoms, and groin pain. She has been taking Oxycodone.   She has never had surgical treatment for calculi in the past. This is not her first kidney stone. She has had 1 stones prior to getting this one.   Patient was very intense unrelenting pain. No fevers or chills. Was given oxycodone in the emergency department.     ALLERGIES: Amoxicillin - Skin Rash, Itching Penicillin - Itching, Skin Rash    MEDICATIONS: Tamsulosin Hcl 0.4 mg capsule  Oxycodone Hcl     GU PSH: No GU PSH       PSH Notes: Rhinoplasty    NON-GU PSH: Bilateral Tubal Ligation Tonsillectomy..            GU PMH: Renal calculus - 2019      NON-GU PMH: GERD       FAMILY HISTORY: 3 Son's - Son Blood In Urine - Mother Kidney Stones - Mother ovarian cancer - Grandmother sickle cell trait - Son      SOCIAL HISTORY: Marital Status: Divorced Preferred Language: English; Ethnicity: Not Hispanic Or Latino; Race: White Current Smoking Status: Patient has never smoked.  <DIV'  Tobacco Use Assessment Completed:  Used Tobacco in last 30 days?   Does not use smokeless tobacco. Has never drank.  Drinks 3 caffeinated drinks per day. Patient's occupation [employer]       REVIEW OF SYSTEMS:       GU Review Female:  Patient reports get up at night to urinate. Patient denies frequent urination, hard to postpone urination, burning /pain with urination, leakage of urine, stream  starts and stops, trouble starting your stream, have to strain to urinate, and being pregnant.      Gastrointestinal (Upper):  Patient reports nausea and vomiting. Patient denies indigestion/ heartburn.      Gastrointestinal (Lower):  Patient denies diarrhea and constipation.      Constitutional:  Patient reports fever, night sweats, and fatigue. Patient denies weight loss.      Skin:  Patient denies skin rash/ lesion and itching.      Eyes:  Patient denies blurred vision and double vision.      Ears/ Nose/ Throat:  Patient denies sore throat and sinus problems.      Hematologic/Lymphatic:  Patient denies swollen glands and easy bruising.      Cardiovascular:  Patient denies leg swelling and chest pains.      Respiratory:  Patient denies cough and shortness of breath.      Endocrine:  Patient denies excessive thirst.      Musculoskeletal:  Patient denies back pain and joint pain.      Neurological:  Patient reports dizziness and headaches.       Psychologic:  Patient denies depression and anxiety.      Notes: Blood in urine       VITAL SIGNS:         02/22/2021 12:03 PM       Weight 194 lb /  88 kg       Height 68 in / 172.72 cm       BP 121/87 mmHg       Pulse 69 /min       Temperature 98.1 F / 36.7 C       BMI 29.5 kg/m       GU PHYSICAL EXAMINATION:        External Genitalia: No hirsutism, no rash, no scarring, no cyst, no erythematous lesion, no papular lesion, no blanched lesion, no warty lesion. No edema.       Urethral Meatus: Normal size. Normal position. No discharge.       Urethra: No tenderness, no mass, no scarring. No hypermobility. No leakage.       Bladder: Normal to palpation, no tenderness, no mass, normal size.       Vagina: No atrophy, no stenosis. No rectocele. No cystocele. No enterocele.       Cervix: No inflammation, no discharge, no lesion, no tenderness, no wart.       Uterus: Normal size. Normal consistency. Normal position. No mobility. No descent.        Anus and Perineum: No hemorrhoids. No anal stenosis. No rectal fissure, no anal fissure. No edema, no dimple, no perineal tenderness, no anal tenderness.       MULTI-SYSTEM PHYSICAL EXAMINATION:        Constitutional: Well-nourished. No physical deformities. Normally developed. Good grooming.       Neck: Neck symmetrical, not swollen. Normal tracheal position.       Respiratory: No labored breathing, no use of accessory muscles.        Cardiovascular: Normal temperature, normal extremity pulses, no swelling, no varicosities.       Lymphatic: No enlargement of neck, axillae, groin.       Skin: No paleness, no jaundice, no cyanosis. No lesion, no ulcer, no rash.       Neurologic / Psychiatric: Oriented to time, oriented to place, oriented to person. No depression, no anxiety, no agitation.       Gastrointestinal: No mass, no tenderness, no rigidity, non obese abdomen.       Eyes: Normal conjunctivae. Normal eyelids.       Ears, Nose, Mouth, and Throat: Left ear no scars, no lesions, no masses. Right ear no scars, no lesions, no masses. Nose no scars, no lesions, no masses. Normal hearing. Normal lips.       Musculoskeletal: Normal gait and station of head and neck.                Complexity of Data:   Source Of History:  Patient  Records Review:  Previous Doctor Records, Previous Hospital Records, Previous Patient Records, POC Tool  Urine Test Review:  Urinalysis  X-Ray Review: C.T. Abdomen/Pelvis: Reviewed Films. Discussed With Patient.     PROCEDURES:    Urinalysis w/Scope  Dipstick Dipstick Cont'd Micro  Color: Yellow Bilirubin: Neg mg/dL WBC/hpf: 6 - 10/hpf  Appearance: Turbid Ketones: Neg mg/dL RBC/hpf: 20 - 40/hpf  Specific Gravity: 1.025 Blood: 3+ ery/uL Bacteria: Mod (26-50/hpf)  pH: 6.0 Protein: 2+ mg/dL Cystals: Ca Oxalate  Glucose: Neg mg/dL Urobilinogen: 0.2 mg/dL Casts: NS (Not Seen)   Nitrites: Neg Trichomonas: Not Present   Leukocyte Esterase: 3+ leu/uL Mucous:  Present    Epithelial Cells: 0 - 5/hpf    Yeast: NS (Not Seen)    Sperm: Not Present   Notes: Micro performed on unspun sample due to QNS  Ketoralac '60mg'$  - [id number], [id number]  Qty: 60 Adm. By: Pricilla Riffle  Unit: mg Lot No [id number]  Route: IM Exp. Date 01/07/2022  Freq: None Mfgr.:   Site: Right Buttock   ASSESSMENT:     ICD-10 Details  1 GU:  Ureteral calculus - N20.1    PLAN:   Medications  New Meds: Cipro 500 mg tablet 1 tablet PO BID #14 0 Refill(s)  Ondansetron Hcl 4 mg tablet 1 tablet PO Q 6 H #15 0 Refill(s)    Orders  Labs Urine Culture  Document  Letter(s):  Created for Patient: Clinical Summary   Notes:  Patient has a large left proximal ureteral stone with poorly controlled pain. I do not think she will pass it and as such I recommended that we proceed to the OR urgently for stent placement. In addition, she does have a UA that is somewhat suspicious, although may be contaminated. I sent a urine culture and will start empirically on ciprofloxacin. Will schedule her for follow-up surgery in several weeks.

## 2021-02-23 ENCOUNTER — Other Ambulatory Visit: Payer: Self-pay | Admitting: Urology

## 2021-02-23 ENCOUNTER — Encounter (HOSPITAL_COMMUNITY): Payer: Self-pay | Admitting: Urology

## 2021-02-23 NOTE — Anesthesia Postprocedure Evaluation (Signed)
Anesthesia Post Note  Patient: [name] [name]  Procedure(s) Performed: CYSTOSCOPY WITH RETROGRADE PYELOGRAM, URETEROSCOPY AND STENT PLACEMENT (Left: Ureter)     Patient location during evaluation: PACU Anesthesia Type: General Level of consciousness: awake and alert Pain management: pain level controlled Vital Signs Assessment: post-procedure vital signs reviewed and stable Respiratory status: spontaneous breathing, nonlabored ventilation, respiratory function stable and patient connected to nasal cannula oxygen Cardiovascular status: blood pressure returned to baseline and stable Postop Assessment: no apparent nausea or vomiting Anesthetic complications: no   No notable events documented.  Last Vitals:  Vitals:   02/22/21 1615 02/22/21 1653  BP: 103/63 109/68  Pulse: 68 77  Resp: 15 16  Temp:    SpO2: 98% 99%    Last Pain:  Vitals:   02/22/21 1653  TempSrc:   PainSc: 0-No pain                 Danise Dehne

## 2021-02-25 NOTE — Addendum Note (Signed)
Addended by: Ardis Hughs on: 02/25/2021 07:02 AM   Modules accepted: Orders

## 2021-03-07 ENCOUNTER — Encounter (HOSPITAL_BASED_OUTPATIENT_CLINIC_OR_DEPARTMENT_OTHER): Payer: Self-pay | Admitting: Urology

## 2021-03-07 ENCOUNTER — Other Ambulatory Visit: Payer: Self-pay

## 2021-03-07 NOTE — Progress Notes (Signed)
Spoke w/ via phone for pre-op interview---pt Lab needs dos----  urine poct             Lab results------none COVID test -----patient states asymptomatic no test needed Arrive at -------745 am 03-10-2021 NPO after MN NO Solid Food.  Clear liquids from MN until--- Med rec completed Medications to take morning of surgery -----prilosec Diabetic medication -----n/a Patient instructed no nail polish to be worn day of surgery Patient instructed to bring photo id and insurance card day of surgery Patient aware to have Driver (ride ) / caregiver    spouse Gwyndolyn Saxon furr for 24 hours after surgery  Patient Special Instructions -----none Pre-Op special Istructions -----none Patient verbalized understanding of instructions that were given at this phone interview. Patient denies shortness of breath, chest pain, fever, cough at this phone interview.

## 2021-03-10 ENCOUNTER — Encounter (HOSPITAL_BASED_OUTPATIENT_CLINIC_OR_DEPARTMENT_OTHER)
Admission: RE | Disposition: A | Discharge status: Home / Self Care | Payer: Self-pay | Source: Home / Self Care | Attending: Urology

## 2021-03-10 ENCOUNTER — Ambulatory Visit (HOSPITAL_BASED_OUTPATIENT_CLINIC_OR_DEPARTMENT_OTHER): Payer: 59 | Admitting: Anesthesiology

## 2021-03-10 ENCOUNTER — Encounter (HOSPITAL_BASED_OUTPATIENT_CLINIC_OR_DEPARTMENT_OTHER): Payer: Self-pay | Admitting: Urology

## 2021-03-10 ENCOUNTER — Ambulatory Visit (HOSPITAL_BASED_OUTPATIENT_CLINIC_OR_DEPARTMENT_OTHER)
Admission: RE | Admit: 2021-03-10 | Discharge: 2021-03-10 | Disposition: A | Discharge status: Home / Self Care | Payer: 59 | Attending: Urology | Admitting: Urology

## 2021-03-10 DIAGNOSIS — Z79899 Other long term (current) drug therapy: Secondary | ICD-10-CM | POA: Insufficient documentation

## 2021-03-10 DIAGNOSIS — E669 Obesity, unspecified: Secondary | ICD-10-CM | POA: Diagnosis not present

## 2021-03-10 DIAGNOSIS — Z880 Allergy status to penicillin: Secondary | ICD-10-CM | POA: Diagnosis not present

## 2021-03-10 DIAGNOSIS — N201 Calculus of ureter: Secondary | ICD-10-CM | POA: Diagnosis not present

## 2021-03-10 DIAGNOSIS — Z6830 Body mass index (BMI) 30.0-30.9, adult: Secondary | ICD-10-CM | POA: Diagnosis not present

## 2021-03-10 HISTORY — PX: HOLMIUM LASER APPLICATION: SHX5852

## 2021-03-10 HISTORY — DX: Family history of other specified conditions: Z84.89

## 2021-03-10 HISTORY — PX: CYSTOSCOPY/URETEROSCOPY/HOLMIUM LASER/STENT PLACEMENT: SHX6546

## 2021-03-10 LAB — POCT PREGNANCY, URINE: Preg Test, Ur: NEGATIVE

## 2021-03-10 SURGERY — CYSTOSCOPY/URETEROSCOPY/HOLMIUM LASER/STENT PLACEMENT
Anesthesia: General | Site: Renal | Laterality: Left

## 2021-03-10 MED ORDER — TRAMADOL HCL 50 MG PO TABS: 50.0000 mg | ORAL_TABLET | Freq: Four times a day (QID) | ORAL | 0 refills | Status: DC | PRN

## 2021-03-10 MED ORDER — ACETAMINOPHEN 10 MG/ML IV SOLN: 1000.0000 mg | Freq: Once | INTRAVENOUS | Status: DC | PRN

## 2021-03-10 MED ORDER — PROPOFOL 10 MG/ML IV BOLUS
INTRAVENOUS | Status: AC
  Filled 2021-03-10: qty 20

## 2021-03-10 MED ORDER — FENTANYL CITRATE (PF) 100 MCG/2ML IJ SOLN
INTRAMUSCULAR | Status: AC
  Filled 2021-03-10: qty 2

## 2021-03-10 MED ORDER — MIDAZOLAM HCL 2 MG/2ML IJ SOLN
INTRAMUSCULAR | Status: AC
  Filled 2021-03-10: qty 2

## 2021-03-10 MED ORDER — IOHEXOL 300 MG/ML  SOLN
INTRAMUSCULAR | Status: DC | PRN
  Administered 2021-03-10: 7 mL

## 2021-03-10 MED ORDER — AMISULPRIDE (ANTIEMETIC) 5 MG/2ML IV SOLN: 10.0000 mg | Freq: Once | INTRAVENOUS | Status: DC | PRN

## 2021-03-10 MED ORDER — ONDANSETRON HCL 4 MG/2ML IJ SOLN
INTRAMUSCULAR | Status: DC | PRN
  Administered 2021-03-10: 4 mg via INTRAVENOUS

## 2021-03-10 MED ORDER — CIPROFLOXACIN HCL 500 MG PO TABS: 500.0000 mg | ORAL_TABLET | Freq: Once | ORAL | 0 refills | Status: AC

## 2021-03-10 MED ORDER — FENTANYL CITRATE (PF) 100 MCG/2ML IJ SOLN
INTRAMUSCULAR | Status: DC | PRN
  Administered 2021-03-10 (×3): 50 ug via INTRAVENOUS

## 2021-03-10 MED ORDER — PROPOFOL 10 MG/ML IV BOLUS
INTRAVENOUS | Status: DC | PRN
  Administered 2021-03-10: 200 mg via INTRAVENOUS

## 2021-03-10 MED ORDER — PROMETHAZINE HCL 25 MG/ML IJ SOLN: 6.2500 mg | INTRAMUSCULAR | Status: DC | PRN

## 2021-03-10 MED ORDER — MIDAZOLAM HCL 5 MG/5ML IJ SOLN
INTRAMUSCULAR | Status: DC | PRN
Start: 2021-03-10 — End: 2021-03-10
  Administered 2021-03-10: 2 mg via INTRAVENOUS

## 2021-03-10 MED ORDER — CIPROFLOXACIN IN D5W 400 MG/200ML IV SOLN
INTRAVENOUS | Status: AC
  Filled 2021-03-10: qty 200

## 2021-03-10 MED ORDER — SODIUM CHLORIDE 0.9 % IR SOLN
Status: DC | PRN
  Administered 2021-03-10 (×2): 3000 mL

## 2021-03-10 MED ORDER — PHENAZOPYRIDINE HCL 200 MG PO TABS: 200.0000 mg | ORAL_TABLET | Freq: Three times a day (TID) | ORAL | 0 refills | Status: DC | PRN

## 2021-03-10 MED ORDER — FENTANYL CITRATE (PF) 100 MCG/2ML IJ SOLN: 25.0000 ug | INTRAMUSCULAR | Status: DC | PRN

## 2021-03-10 MED ORDER — LIDOCAINE HCL (PF) 2 % IJ SOLN
INTRAMUSCULAR | Status: AC
  Filled 2021-03-10: qty 5

## 2021-03-10 MED ORDER — 0.9 % SODIUM CHLORIDE (POUR BTL) OPTIME
TOPICAL | Status: DC | PRN
  Administered 2021-03-10: 500 mL

## 2021-03-10 MED ORDER — KETOROLAC TROMETHAMINE 30 MG/ML IJ SOLN
INTRAMUSCULAR | Status: AC
  Filled 2021-03-10: qty 1

## 2021-03-10 MED ORDER — BELLADONNA ALKALOIDS-OPIUM 16.2-60 MG RE SUPP
RECTAL | Status: AC
  Filled 2021-03-10: qty 1

## 2021-03-10 MED ORDER — DEXAMETHASONE SODIUM PHOSPHATE 10 MG/ML IJ SOLN
INTRAMUSCULAR | Status: DC | PRN
  Administered 2021-03-10: 10 mg via INTRAVENOUS

## 2021-03-10 MED ORDER — LACTATED RINGERS IV SOLN: INTRAVENOUS | Status: DC

## 2021-03-10 MED ORDER — BELLADONNA ALKALOIDS-OPIUM 16.2-60 MG RE SUPP
RECTAL | Status: DC | PRN
  Administered 2021-03-10: 1 via RECTAL

## 2021-03-10 MED ORDER — GLYCOPYRROLATE PF 0.2 MG/ML IJ SOSY
PREFILLED_SYRINGE | INTRAMUSCULAR | Status: AC
  Filled 2021-03-10: qty 1

## 2021-03-10 MED ORDER — DEXAMETHASONE SODIUM PHOSPHATE 10 MG/ML IJ SOLN
INTRAMUSCULAR | Status: AC
  Filled 2021-03-10: qty 1

## 2021-03-10 MED ORDER — CIPROFLOXACIN IN D5W 400 MG/200ML IV SOLN
400.0000 mg | INTRAVENOUS | Status: AC
  Administered 2021-03-10: 400 mg via INTRAVENOUS

## 2021-03-10 MED ORDER — KETOROLAC TROMETHAMINE 30 MG/ML IJ SOLN
30.0000 mg | Freq: Once | INTRAMUSCULAR | Status: AC
  Administered 2021-03-10: 30 mg via INTRAVENOUS

## 2021-03-10 MED ORDER — LIDOCAINE 2% (20 MG/ML) 5 ML SYRINGE
INTRAMUSCULAR | Status: DC | PRN
  Administered 2021-03-10: 40 mg via INTRAVENOUS
  Administered 2021-03-10: 60 mg via INTRAVENOUS

## 2021-03-10 MED ORDER — ONDANSETRON HCL 4 MG/2ML IJ SOLN
INTRAMUSCULAR | Status: AC
  Filled 2021-03-10: qty 2

## 2021-03-10 SURGICAL SUPPLY — 25 items
BAG DRAIN URO-CYSTO SKYTR STRL (DRAIN) ×3 IMPLANT
BAG DRN UROCATH (DRAIN) ×2
BASKET LASER NITINOL 1.9FR (BASKET) IMPLANT
BASKET STONE NCOMPASS (UROLOGICAL SUPPLIES) ×3 IMPLANT
BSKT STON RTRVL 120 1.9FR (BASKET)
CATH URET 5FR 28IN OPEN ENDED (CATHETERS) ×3 IMPLANT
CATH URET DUAL LUMEN 6-10FR 50 (CATHETERS) IMPLANT
CLOTH BEACON ORANGE TIMEOUT ST (SAFETY) ×3 IMPLANT
COVER DOME SNAP 22 D (MISCELLANEOUS) ×3 IMPLANT
EXTRACTOR STONE 1.7FRX115CM (UROLOGICAL SUPPLIES) ×3 IMPLANT
GLOVE SURG ENC MOIS LTX SZ7.5 (GLOVE) ×3 IMPLANT
GLOVE SURG UNDER POLY LF SZ6.5 (GLOVE) ×6 IMPLANT
GOWN STRL REUS W/TWL LRG LVL3 (GOWN DISPOSABLE) ×3 IMPLANT
GOWN STRL REUS W/TWL XL LVL3 (GOWN DISPOSABLE) ×3 IMPLANT
GUIDEWIRE STR DUAL SENSOR (WIRE) ×3 IMPLANT
IV NS IRRIG 3000ML ARTHROMATIC (IV SOLUTION) ×6 IMPLANT
KIT TURNOVER CYSTO (KITS) ×3 IMPLANT
MANIFOLD NEPTUNE II (INSTRUMENTS) ×3 IMPLANT
NS IRRIG 500ML POUR BTL (IV SOLUTION) ×3 IMPLANT
PACK CYSTO (CUSTOM PROCEDURE TRAY) ×3 IMPLANT
STENT URET 6FRX24 CONTOUR (STENTS) ×3 IMPLANT
TRACTIP FLEXIVA PULS ID 200XHI (Laser) ×2 IMPLANT
TRACTIP FLEXIVA PULSE ID 200 (Laser) ×3
TUBE CONNECTING 12X1/4 (SUCTIONS) ×3 IMPLANT
TUBING UROLOGY SET (TUBING) ×3 IMPLANT

## 2021-03-10 NOTE — Discharge Instructions (Addendum)
DISCHARGE INSTRUCTIONS FOR KIDNEY STONE/URETERAL STENT   MEDICATIONS:  1.  Resume all your other meds from home - except do not take any extra narcotic pain meds that you may have at home.  2. Pyridium is to help with the burning/stinging when you urinate. 3. Tramadol is for moderate/severe pain, otherwise taking upto 1000 mg every 6 hours of plainTylenol will help treat your pain.   4. Take Cipro one hour prior to removal of your stent.   ACTIVITY:  1. No strenuous activity x 1week  2. No driving while on narcotic pain medications  3. Drink plenty of water  4. Continue to walk at home - you can still get blood clots when you are at home, so keep active, but don't over do it.  5. May return to work/school tomorrow or when you feel ready   BATHING:  1. You can shower and we recommend daily showers  2. You have a string coming from your urethra: The stent string is attached to your ureteral stent. Do not pull on this.   SIGNS/SYMPTOMS TO CALL:  Please call us if you have a fever greater than 101.5, uncontrolled nausea/vomiting, uncontrolled pain, dizziness, unable to urinate, bloody urine, chest pain, shortness of breath, leg swelling, leg pain, redness around wound, drainage from wound, or any other concerns or questions.   You can reach [place] at [phone].   FOLLOW-UP:  1. You have an appointment in 6 weeks with a ultrasound of your kidneys prior.  2. You have a string attached to your stent, you may remove it on Wednesday September 7th. To do this, pull the strings until the stents are completely removed. You may feel an odd sensation in your back.    Post Anesthesia Home Care Instructions  Activity: Get plenty of rest for the remainder of the day. A responsible individual must stay with you for 24 hours following the procedure.  For the next 24 hours, DO NOT: -Drive a car -Paediatric nurse -Drink alcoholic beverages -Take any medication unless instructed by your  physician -Make any legal decisions or sign important papers.  Meals: Start with liquid foods such as gelatin or soup. Progress to regular foods as tolerated. Avoid greasy, spicy, heavy foods. If nausea and/or vomiting occur, drink only clear liquids until the nausea and/or vomiting subsides. Call your physician if vomiting continues.  Special Instructions/Symptoms: Your throat may feel dry or sore from the anesthesia or the breathing tube placed in your throat during surgery. If this causes discomfort, gargle with warm salt water. The discomfort should disappear within 24 hours.  I   [facility] [phone] Post Ureteroscopy With or Without Stent Instructions  Definitions:  Ureter: The duct that transports urine from the kidney to the bladder. Stent:   A plastic hollow tube that is placed into the ureter, from the kidney to the bladder to prevent the ureter from swelling shut.  GENERAL INSTRUCTIONS:  Despite the fact that no skin incisions were used, the area around the ureter and bladder is raw and irritated. The stent is a foreign body which will further irritate the bladder wall. This irritation is manifested by increased frequency of urination, both day and night, and by an increase in the urge to urinate. In some, the urge to urinate is present almost always. Sometimes the urge is strong enough that you may not be able to stop yourself from urinating. The only real cure is to remove the stent and then give time for  the bladder wall to heal which can't be done until the danger of the ureter swelling shut has passed, which varies.  You may see some blood in your urine while the stent is in place and a few days afterwards. Do not be alarmed, even if the urine was clear for a while. Get off your feet and drink lots of fluids until clearing occurs. If you start to pass clots or don't improve, call us.  DIET: You may return to your normal diet immediately. Because of the  raw surface of your bladder, alcohol, spicy foods, acid type foods and drinks with caffeine may cause irritation or frequency and should be used in moderation. To keep your urine flowing freely and to avoid constipation, drink plenty of fluids during the day ( 8-10 glasses ). Tip: Avoid cranberry juice because it is very acidic.  ACTIVITY: Your physical activity doesn't need to be restricted. However, if you are very active, you may see some blood in your urine. We suggest that you reduce your activity under these circumstances until the bleeding has stopped.  BOWELS: It is important to keep your bowels regular during the postoperative period. Straining with bowel movements can cause bleeding. A bowel movement every other day is reasonable. Use a mild laxative if needed, such as Milk of Magnesia 2-3 tablespoons, or 2 Dulcolax tablets. Call if you continue to have problems. If you have been taking narcotics for pain, before, during or after your surgery, you may be constipated. Take a laxative if necessary.   MEDICATION: You should resume your pre-surgery medications unless told not to. In addition you will often be given an antibiotic to prevent infection. These should be taken as prescribed until the bottles are finished unless you are having an unusual reaction to one of the drugs.  PROBLEMS YOU SHOULD REPORT TO [place]: Fevers over 100.5 Fahrenheit. Heavy bleeding, or clots ( See above notes about blood in urine ). Inability to urinate. Drug reactions ( hives, rash, nausea, vomiting, diarrhea ). Severe burning or pain with urination that is not improving.  FOLLOW-UP: You will need a follow-up appointment to monitor your progress. Call for this appointment at the number listed above. Usually the first appointment will be about three to fourteen days after your surgery.

## 2021-03-10 NOTE — Anesthesia Preprocedure Evaluation (Addendum)
Anesthesia Evaluation  Patient identified by MRN, date of birth, ID band Patient awake    Reviewed: Allergy & Precautions, NPO status , Patient's Chart, lab work & pertinent test results  Airway Mallampati: II  TM Distance: >3 FB Neck ROM: Full    Dental  (+) Chipped,    Pulmonary neg pulmonary ROS,    Pulmonary exam normal breath sounds clear to auscultation       Cardiovascular negative cardio ROS Normal cardiovascular exam Rhythm:Regular Rate:Normal     Neuro/Psych  Headaches, negative psych ROS   GI/Hepatic Neg liver ROS, GERD  Medicated and Controlled,  Endo/Other  negative endocrine ROS  Renal/GU negative Renal ROS     Musculoskeletal negative musculoskeletal ROS (+)   Abdominal (+) + obese,   Peds  Hematology negative hematology ROS (+)   Anesthesia Other Findings LEFT URETERAL STONE  Reproductive/Obstetrics hcg negative                           Anesthesia Physical Anesthesia Plan  ASA: 2  Anesthesia Plan: General   Post-op Pain Management:    Induction: Intravenous  PONV Risk Score and Plan: 4 or greater and Ondansetron, Dexamethasone, Midazolam and Treatment may vary due to age or medical condition  Airway Management Planned: LMA  Additional Equipment:   Intra-op Plan:   Post-operative Plan: Extubation in OR  Informed Consent: I have reviewed the patients History and Physical, chart, labs and discussed the procedure including the risks, benefits and alternatives for the proposed anesthesia with the patient or authorized representative who has indicated his/her understanding and acceptance.     Dental advisory given  Plan Discussed with: CRNA  Anesthesia Plan Comments:        Anesthesia Quick Evaluation

## 2021-03-10 NOTE — Anesthesia Postprocedure Evaluation (Signed)
Anesthesia Post Note  Patient: [name] [name]  Procedure(s) Performed: CYSTOSCOPY RETROGRADE, LEFT URETEROSCOPY/ STONE BASKETRY [place] LASER/STENT EXCHANGE (Left: Renal) HOLMIUM LASER APPLICATION (Renal)     Patient location during evaluation: PACU Anesthesia Type: General Level of consciousness: awake Pain management: pain level controlled Vital Signs Assessment: post-procedure vital signs reviewed and stable Respiratory status: spontaneous breathing, nonlabored ventilation, respiratory function stable and patient connected to nasal cannula oxygen Cardiovascular status: blood pressure returned to baseline and stable Postop Assessment: no apparent nausea or vomiting Anesthetic complications: no   No notable events documented.  Last Vitals:  Vitals:   03/10/21 1200 03/10/21 1229  BP: 122/84 128/90  Pulse: 89 84  Resp: 12 16  Temp:  36.5 C  SpO2: 100% 100%    Last Pain:  Vitals:   03/10/21 1229  TempSrc:   PainSc: 1                  Jalexis Breed P Dequavius Kuhner

## 2021-03-10 NOTE — Op Note (Signed)
Preoperative diagnosis: left ureteral calculus  Postoperative diagnosis: left ureteral calculus  Procedure:  Cystoscopy left ureteroscopy and stone removal Ureteroscopic laser lithotripsy left 70F x 24 ureteral stent exchange left retrograde pyelography with interpretation  Surgeon: Ardis Hughs, MD  Anesthesia: General  Complications: None  Intraoperative findings: left retrograde pyelography demonstrated a filling defect within the left ureter consistent with the patient's known calculus without other abnormalities.  EBL: Minimal  Specimens: left ureteral calculus  Disposition of specimens: [facility] for stone analysis  Indication: [name] [name] is a 45 y.o.   patient with a large left-sided proximal ureteral stone who presented to clinic approximately 2 weeks ago with significant pain.  She subsequently had a stent placed to alleviate her discomfort and presents today for stone removal.  After reviewing the management options for treatment, the patient elected to proceed with the above surgical procedure(s). We have discussed the potential benefits and risks of the procedure, side effects of the proposed treatment, the likelihood of the patient achieving the goals of the procedure, and any potential problems that might occur during the procedure or recuperation. Informed consent has been obtained.   Description of procedure:  The patient was taken to the operating room and general anesthesia was induced.  The patient was placed in the dorsal lithotomy position, prepped and draped in the usual sterile fashion, and preoperative antibiotics were administered. A preoperative time-out was performed.   Cystourethroscopy was performed.  The patient's urethra was examined and was normal.  demonstrated bilobar prostatic hypertrophy. / bilobar prostatic hypertrophy with a median lobe. The bladder was then systematically examined in its entirety. There was no  evidence for any bladder tumors, stones, or other mucosal pathology.    Using a stent grasper the stent was grasped emanating from the left ureteral orifice and pulled to the urethral meatus.  I subsequently advanced a wire over the stent and remove the stent from the wire.  I then exchanged to the wire for a 5 [place] open-ended ureteral catheter. Omnipaque contrast was injected through the ureteral catheter and a retrograde pyelogram was performed with findings as dictated above.  A 0.38 sensor guidewire was then advanced up the left ureter into the renal pelvis under fluoroscopic guidance. The 6 Fr semirigid ureteroscope was then advanced into the ureter next to the guidewire and the calculus was identified.   The stone was then fragmented with the 365 micron holmium laser fiber on a setting of 1.0 and frequency of 10 Hz.   All stones were then removed from the ureter with an N-gage nitinol basket.  Reinspection of the ureter revealed no remaining visible stones or fragments.  I then remove the semirigid ureteroscope and exchanged for the single lumen flexible ureteroscope and advanced this up into the left renal pelvis under visual guidance.  Pyeloscopy was performed and 2 small fragments were noted which I was able to use the basket to remove.  I swept the ureter from any additional fragments.  I then remove the scope.  I subsequently used the cystoscope to remove all the stone fragments which I had dropped into the bladder.  Once all the stone fragments were removed and the bladder was emptied  the wire was then backloaded through the cystoscope and a ureteral stent was advance over the wire using Seldinger technique.  The stent was positioned appropriately under fluoroscopic and cystoscopic guidance.  The wire was then removed with an adequate stent curl noted in the renal pelvis as  well as in the bladder.  The bladder was then emptied and the procedure ended.  The patient appeared to tolerate the  procedure well and without complications.  The patient was able to be awakened and transferred to the recovery unit in satisfactory condition.   Disposition: The tether of the stent was left on and sucked inside the patient's vagina.  Instructions for removing the stent have been provided to the patient. The patient has been scheduled for followup in 6 weeks with a renal ultrasound.

## 2021-03-10 NOTE — Anesthesia Procedure Notes (Signed)
Procedure Name: LMA Insertion Date/Time: 03/10/2021 9:49 AM Performed by: Rogers Blocker, CRNA Pre-anesthesia Checklist: Patient identified, Emergency Drugs available, Suction available and Patient being monitored Patient Re-evaluated:Patient Re-evaluated prior to induction Oxygen Delivery Method: Circle System Utilized Preoxygenation: Pre-oxygenation with 100% oxygen Induction Type: IV induction Ventilation: Mask ventilation without difficulty LMA: LMA inserted LMA Size: 4.0 Number of attempts: 1 Placement Confirmation: positive ETCO2 Tube secured with: Tape Dental Injury: Teeth and Oropharynx as per pre-operative assessment

## 2021-03-10 NOTE — H&P (Signed)
Acute Kidney Stone  HPI: [name] [name] is a 45 year-old female patient who is here for further eval and management of kidney stones.  She was diagnosed with a kidney stone on approximately 02/20/2021. The patient presented to [facility] with symptoms of a kidney stone.   Her pain started about approximately 02/20/2021. The pain is on the left side.   Abdomen/Pelvic CT: 27m left proximal ureteral stone. The patient underwent No imaging prior to today's appointment.   The patient relates initially having nausea, flank pain, voiding symptoms, and groin pain. She has been taking Oxycodone.   She has never had surgical treatment for calculi in the past. This is not her first kidney stone. She has had 1 stones prior to getting this one.   Patient was very intense unrelenting pain. No fevers or chills. Was given oxycodone in the emergency department.     ALLERGIES: Amoxicillin - Skin Rash, Itching Penicillin - Itching, Skin Rash    MEDICATIONS: Tamsulosin Hcl 0.4 mg capsule  Oxycodone Hcl     GU PSH: No GU PSH      PSH Notes: Rhinoplasty   NON-GU PSH: Bilateral Tubal Ligation Tonsillectomy..     GU PMH: Renal calculus - 2019    NON-GU PMH: GERD    FAMILY HISTORY: 3 Son's - Son Blood In Urine - Mother Kidney Stones - Mother ovarian cancer - Grandmother sickle cell trait - Son   SOCIAL HISTORY: Marital Status: Divorced Preferred Language: English; Ethnicity: Not Hispanic Or Latino; Race: White Current Smoking Status: Patient has never smoked.   Tobacco Use Assessment Completed: Used Tobacco in last 30 days? Does not use smokeless tobacco. Has never drank.  Drinks 3 caffeinated drinks per day. 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 Patient denies frequent urination, hard to postpone urination, burning /pain with urination, leakage of urine, stream starts and stops, trouble  starting your stream, have to strain to urinate, and being pregnant.  Gastrointestinal (Upper):   Patient reports nausea and vomiting. Patient denies indigestion/ heartburn.  Gastrointestinal (Lower):   Patient denies diarrhea and constipation.  Constitutional:   Patient reports fever, night sweats, and fatigue. Patient denies weight loss.  Skin:   Patient denies skin rash/ lesion and itching.  Eyes:   Patient denies blurred vision and double vision.  Ears/ Nose/ Throat:   Patient denies sore throat and sinus problems.  Hematologic/Lymphatic:   Patient denies swollen glands and easy bruising.  Cardiovascular:   Patient denies leg swelling and chest pains.  Respiratory:   Patient denies cough and shortness of breath.  Endocrine:   Patient denies excessive thirst.  Musculoskeletal:   Patient denies back pain and joint pain.  Neurological:   Patient reports dizziness and headaches.   Psychologic:   Patient denies depression and anxiety.   Notes: Blood in urine    VITAL SIGNS:      02/22/2021 12:03 PM  Weight 194 lb / 88 kg  Height 68 in / 172.72 cm  BP 121/87 mmHg  Pulse 69 /min  Temperature 98.1 F / 36.7 C  BMI 29.5 kg/m   GU PHYSICAL EXAMINATION:    External Genitalia: No hirsutism, no rash, no scarring, no cyst, no erythematous lesion, no papular lesion, no blanched lesion, no warty lesion. No edema.  Urethral Meatus: Normal size. Normal position. No discharge.  Urethra: No tenderness, no mass, no scarring. No  hypermobility. No leakage.  Bladder: Normal to palpation, no tenderness, no mass, normal size.  Vagina: No atrophy, no stenosis. No rectocele. No cystocele. No enterocele.  Cervix: No inflammation, no discharge, no lesion, no tenderness, no wart.  Uterus: Normal size. Normal consistency. Normal position. No mobility. No descent.  Anus and Perineum: No hemorrhoids. No anal stenosis. No rectal fissure, no anal fissure. No edema, no dimple, no perineal tenderness, no anal  tenderness.   MULTI-SYSTEM PHYSICAL EXAMINATION:    Constitutional: Well-nourished. No physical deformities. Normally developed. Good grooming.  Neck: Neck symmetrical, not swollen. Normal tracheal position.  Respiratory: No labored breathing, no use of accessory muscles.   Cardiovascular: Normal temperature, normal extremity pulses, no swelling, no varicosities.  Lymphatic: No enlargement of neck, axillae, groin.  Skin: No paleness, no jaundice, no cyanosis. No lesion, no ulcer, no rash.  Neurologic / Psychiatric: Oriented to time, oriented to place, oriented to person. No depression, no anxiety, no agitation.  Gastrointestinal: No mass, no tenderness, no rigidity, non obese abdomen.  Eyes: Normal conjunctivae. Normal eyelids.  Ears, Nose, Mouth, and Throat: Left ear no scars, no lesions, no masses. Right ear no scars, no lesions, no masses. Nose no scars, no lesions, no masses. Normal hearing. Normal lips.  Musculoskeletal: Normal gait and station of head and neck.     Complexity of Data:  Source Of History:  Patient  Records Review:   Previous Doctor Records, Previous Hospital Records, Previous Patient Records, POC Tool  Urine Test Review:   Urinalysis  X-Ray Review: C.T. Abdomen/Pelvis: Reviewed Films. Discussed With Patient.     PROCEDURES:          Urinalysis w/Scope Dipstick Dipstick Cont'd Micro  Color: Yellow Bilirubin: Neg mg/dL WBC/hpf: 6 - 10/hpf  Appearance: Turbid Ketones: Neg mg/dL RBC/hpf: 20 - 40/hpf  Specific Gravity: 1.025 Blood: 3+ ery/uL Bacteria: Mod (26-50/hpf)  pH: 6.0 Protein: 2+ mg/dL Cystals: Ca Oxalate  Glucose: Neg mg/dL Urobilinogen: 0.2 mg/dL Casts: NS (Not Seen)    Nitrites: Neg Trichomonas: Not Present    Leukocyte Esterase: 3+ leu/uL Mucous: Present      Epithelial Cells: 0 - 5/hpf      Yeast: NS (Not Seen)      Sperm: Not Present    Notes: Micro performed on unspun sample due to QNS          Ketoralac '60mg'$  - [id number], [id number] Qty: 60 Adm. By:  Pricilla Riffle  Unit: mg Lot No [id number]  Route: IM Exp. Date 01/07/2022  Freq: None Mfgr.:   Site: Right Buttock   ASSESSMENT:      ICD-10 Details  1 GU:   Ureteral calculus - N20.1    PLAN:            Medications New Meds: Cipro 500 mg tablet 1 tablet PO BID   #14  0 Refill(s)  Ondansetron Hcl 4 mg tablet 1 tablet PO Q 6 H   #15  0 Refill(s)            Orders Labs Urine Culture          Document Letter(s):  Created for Patient: Clinical Summary         Notes:   Patient has a large left proximal ureteral stone with poorly controlled pain. I do not think she will pass it and as such I recommended that we proceed to the OR urgently for stent placement. In addition, she does have a UA that is somewhat suspicious, although may be  contaminated. I sent a urine culture and will start empirically on ciprofloxacin. Will schedule her for follow-up surgery in several weeks.

## 2021-03-10 NOTE — Transfer of Care (Signed)
Immediate Anesthesia Transfer of Care Note  Patient: [name] [name]  Procedure(s) Performed: CYSTOSCOPY RETROGRADE, LEFT URETEROSCOPY/ STONE BASKETRY [place] LASER/STENT EXCHANGE (Left: Renal) HOLMIUM LASER APPLICATION (Renal)  Patient Location: PACU  Anesthesia Type:General  Level of Consciousness: awake, alert , oriented and patient cooperative  Airway & Oxygen Therapy: Patient Spontanous Breathing and Patient connected to face mask oxygen  Post-op Assessment: Report given to RN and Post -op Vital signs reviewed and stable  Post vital signs: Reviewed and stable  Last Vitals:  Vitals Value Taken Time  BP 124/75 03/10/21 1134  Temp    Pulse 99 03/10/21 1136  Resp 13 03/10/21 1136  SpO2 96 % 03/10/21 1136  Vitals shown include unvalidated device data.  Last Pain:  Vitals:   03/10/21 0815  TempSrc: Oral  PainSc: 0-No pain      Patients Stated Pain Goal: 7 ([id number])  Complications: No notable events documented.

## 2021-03-10 NOTE — Interval H&P Note (Signed)
History and Physical Interval Note:  03/10/2021 9:23 AM  [name] [name]  has presented today for surgery, with the diagnosis of LEFT URETERAL STONE.  The various methods of treatment have been discussed with the patient and family. After consideration of risks, benefits and other options for treatment, the patient has consented to  Procedure(s): LEFT URETEROSCOPY/HOLMIUM LASER/STENT EXCHANGE (Left) as a surgical intervention.  The patient's history has been reviewed, patient examined, no change in status, stable for surgery.  I have reviewed the patient's chart and labs.  Questions were answered to the patient's satisfaction.     Ardis Hughs

## 2021-03-11 ENCOUNTER — Encounter (HOSPITAL_BASED_OUTPATIENT_CLINIC_OR_DEPARTMENT_OTHER): Payer: Self-pay | Admitting: Urology

## 2022-03-28 ENCOUNTER — Other Ambulatory Visit (HOSPITAL_COMMUNITY): Payer: Self-pay | Admitting: Orthopedic Surgery

## 2022-03-28 DIAGNOSIS — M79661 Pain in right lower leg: Secondary | ICD-10-CM | POA: Diagnosis not present

## 2022-03-28 DIAGNOSIS — M79605 Pain in left leg: Secondary | ICD-10-CM | POA: Diagnosis not present

## 2022-03-28 DIAGNOSIS — M25562 Pain in left knee: Secondary | ICD-10-CM | POA: Diagnosis not present

## 2022-03-28 DIAGNOSIS — M79604 Pain in right leg: Secondary | ICD-10-CM | POA: Diagnosis not present

## 2022-03-28 DIAGNOSIS — M79662 Pain in left lower leg: Secondary | ICD-10-CM | POA: Diagnosis not present

## 2022-03-29 ENCOUNTER — Ambulatory Visit (HOSPITAL_COMMUNITY)
Admission: RE | Admit: 2022-03-29 | Discharge: 2022-03-29 | Disposition: A | Discharge status: Home / Self Care | Payer: 59 | Source: Ambulatory Visit | Attending: Orthopedic Surgery | Admitting: Orthopedic Surgery

## 2022-03-29 DIAGNOSIS — M7989 Other specified soft tissue disorders: Secondary | ICD-10-CM | POA: Diagnosis not present

## 2022-03-29 DIAGNOSIS — M79605 Pain in left leg: Secondary | ICD-10-CM | POA: Diagnosis not present

## 2022-04-25 DIAGNOSIS — N6321 Unspecified lump in the left breast, upper outer quadrant: Secondary | ICD-10-CM | POA: Diagnosis not present

## 2022-04-25 DIAGNOSIS — N6323 Unspecified lump in the left breast, lower outer quadrant: Secondary | ICD-10-CM | POA: Diagnosis not present

## 2022-04-25 DIAGNOSIS — R92333 Mammographic heterogeneous density, bilateral breasts: Secondary | ICD-10-CM | POA: Diagnosis not present

## 2022-05-02 ENCOUNTER — Other Ambulatory Visit: Payer: Self-pay | Admitting: Radiology

## 2022-05-02 DIAGNOSIS — N6321 Unspecified lump in the left breast, upper outer quadrant: Secondary | ICD-10-CM | POA: Diagnosis not present

## 2022-05-02 DIAGNOSIS — N6012 Diffuse cystic mastopathy of left breast: Secondary | ICD-10-CM | POA: Diagnosis not present

## 2022-05-02 DIAGNOSIS — C50812 Malignant neoplasm of overlapping sites of left female breast: Secondary | ICD-10-CM | POA: Diagnosis not present

## 2022-05-04 ENCOUNTER — Encounter: Payer: Self-pay | Admitting: Family Medicine

## 2022-05-04 ENCOUNTER — Telehealth: Payer: Self-pay | Admitting: Hematology and Oncology

## 2022-05-04 NOTE — Telephone Encounter (Signed)
LVM for patient to return in reference to upcoming afternoon clinic appointment for 11/1

## 2022-05-04 NOTE — Telephone Encounter (Signed)
Spoke to patient to confirm upcoming morning [facility] clinic appointment on 11/1, paperwork will be sent by [facility].   Gave location and time, also informed patient that the surgeon's office would be calling as well to get information from them similar to the packet that they will be receiving so make sure to do both.  Reminded patient that all providers will be coming to the clinic to see them HERE and if they had any questions to not hesitate to reach back out to myself or their navigators.

## 2022-05-08 ENCOUNTER — Encounter: Payer: Self-pay | Admitting: *Deleted

## 2022-05-08 DIAGNOSIS — Z170 Estrogen receptor positive status [ER+]: Secondary | ICD-10-CM | POA: Insufficient documentation

## 2022-05-08 NOTE — Progress Notes (Signed)
Radiation Oncology         ([phone]) [phone] ________________________________  Name: CANA MIGNANO        MRN: [id number]  Date of Service: 05/10/2022 DOB: Oct 27, 1975  VO:[id number], No Pcp Per  [name], MD     REFERRING PHYSICIAN: [name], MD   DIAGNOSIS: The encounter diagnosis was Malignant neoplasm of upper-outer quadrant of left breast in female, estrogen receptor positive ([place]).   HISTORY OF PRESENT ILLNESS: [name] [name] is a 46 y.o. female seen in the multidisciplinary breast clinic for a new diagnosis of left breast cancer. The patient was noted to have a palpable mass in the left breast and proceeded with diagnostic imaging which identified a mass in the left breast in the lower outer quadrant.  There were suspicious pleomorphic calcifications also noted at the time of mammogram.  By ultrasound, a 3.9 cm mass in the 3 o'clock position of the left breast was noted there was also a 3 mm round mass in the 1 o'clock position and heterogeneous shadowing in the 3 o'clock position measuring up to 1.1 cm.  No axillary adenopathy was identified.  Biopsy on 05/02/2022 showed a grade 2 invasive ductal carcinoma with associated intermediate grade DCIS.  The second biopsy in the 1 o'clock position showed fibrocystic changes with usual ductal hyperplasia.  Her cancer was ER/PR positive, HER2 amplified with a Ki-67 of 50%.  She is seen today to discuss treatment of her cancer.    PREVIOUS RADIATION THERAPY: No   PAST MEDICAL HISTORY:  Past Medical History:  Diagnosis Date   Family history of adverse reaction to anesthesia    mother slow to awaken   GERD (gastroesophageal reflux disease)    History of kidney stones    last stone 6 months ago    Migraine    Seasonal allergies        PAST SURGICAL HISTORY: Past Surgical History:  Procedure Laterality Date   ADENOIDECTOMY  1991   CYSTOSCOPY WITH RETROGRADE PYELOGRAM, URETEROSCOPY AND STENT PLACEMENT Left 02/22/2021    Procedure: [place], URETEROSCOPY AND STENT PLACEMENT;  Surgeon: Remi Haggard, MD;  Location: [facility];  Service: Urology;  Laterality: Left;   CYSTOSCOPY/URETEROSCOPY/HOLMIUM LASER/STENT PLACEMENT Left 03/10/2021   Procedure: CYSTOSCOPY RETROGRADE, LEFT URETEROSCOPY/ STONE BASKETRY [place] LASER/STENT EXCHANGE;  Surgeon: Ardis Hughs, MD;  Location: [facility];  Service: Urology;  Laterality: Left;   ESOPHAGOGASTRODUODENOSCOPY N/A 11/17/2013   Procedure: ESOPHAGOGASTRODUODENOSCOPY (EGD);  Surgeon: Milus Banister, MD;  Location: [facility];  Service: Endoscopy;  Laterality: N/A;   ESOPHAGOGASTRODUODENOSCOPY N/A 10/15/2015   Procedure: ESOPHAGOGASTRODUODENOSCOPY (EGD);  Surgeon: Gatha Mayer, MD;  Location: [facility];  Service: Endoscopy;  Laterality: N/A;   HOLMIUM LASER APPLICATION  07/18/2991   Procedure: HOLMIUM LASER APPLICATION;  Surgeon: Ardis Hughs, MD;  Location: [facility];  Service: Urology;;   NASAL SEPTOPLASTY W/ TURBINOPLASTY Bilateral 03/18/2018   Procedure: BILATERAL NASAL SEPTOPLASTY WITH TURBINATE REDUCTION;  Surgeon: Jodi Marble, MD;  Location: [facility];  Service: ENT;  Laterality: Bilateral;   TONSILLECTOMY  1991   TUBAL LIGATION  2005     FAMILY HISTORY:  Family History  Problem Relation Age of Onset   Asthma Mother    Diabetes Mother    Colon polyps Mother    Esophageal cancer Maternal Grandfather    Ovarian cancer Maternal Grandmother    Diabetes Maternal Grandmother      SOCIAL HISTORY:  reports that  she has never smoked. She has never used smokeless tobacco. She reports that she does not drink alcohol and does not use drugs.  The patient is single but in a relationship.  She works for [employer]. She's accompanied by her partner Gwyndolyn Saxon, and her parents.   ALLERGIES: Amoxicillin, Penicillins, Duloxetine, and  Hydrocodone   MEDICATIONS:  Current Outpatient Medications  Medication Sig Dispense Refill   Multiple Vitamins-Minerals (ALIVE MULTI-VITAMIN) CHEW Chew 1 tablet by mouth daily.     omeprazole (PRILOSEC) 20 MG capsule Take 20 mg by mouth daily.     phenazopyridine (PYRIDIUM) 200 MG tablet Take 1 tablet (200 mg total) by mouth 3 (three) times daily as needed for pain. 10 tablet 0   tamsulosin (FLOMAX) 0.4 MG CAPS capsule Take 1 capsule (0.4 mg total) by mouth daily. (Patient taking differently: Take 0.4 mg by mouth daily after supper.) 7 capsule 0   traMADol (ULTRAM) 50 MG tablet Take 1-2 tablets (50-100 mg total) by mouth every 6 (six) hours as needed for moderate pain. 15 tablet 0   No current facility-administered medications for this visit.     REVIEW OF SYSTEMS: On review of systems, the patient reports that she is doing okay overall. She is having nipple discharge, discomfort from her biopsy, and reports headaches, joint pains, and poor appetite and weight loss. No other complaints are noted.     PHYSICAL EXAM:  Wt Readings from Last 3 Encounters:  03/10/21 198 lb 14.4 oz (90.2 kg)  02/22/21 197 lb 6.4 oz (89.5 kg)  02/21/21 194 lb (88 kg)   Temp Readings from Last 3 Encounters:  03/10/21 97.7 F (36.5 C)  02/22/21 98.3 F (36.8 C)  02/21/21 98.2 F (36.8 C)   BP Readings from Last 3 Encounters:  03/10/21 128/90  02/22/21 109/68  02/22/21 127/69   Pulse Readings from Last 3 Encounters:  03/10/21 84  02/22/21 77  02/22/21 (!) 116    In general this is a well appearing Caucasian female in no acute distress. She's alert and oriented x4 and appropriate throughout the examination. Cardiopulmonary assessment is negative for acute distress and she exhibits normal effort. Bilateral breast exam is deferred.    ECOG = 1  0 - Asymptomatic (Fully active, able to carry on all predisease activities without restriction)  1 - Symptomatic but completely ambulatory (Restricted  in physically strenuous activity but ambulatory and able to carry out work of a light or sedentary nature. For example, light housework, office work)  2 - Symptomatic, <50% in bed during the day (Ambulatory and capable of all self care but unable to carry out any work activities. Up and about more than 50% of waking hours)  3 - Symptomatic, >50% in bed, but not bedbound (Capable of only limited self-care, confined to bed or chair 50% or more of waking hours)  4 - Bedbound (Completely disabled. Cannot carry on any self-care. Totally confined to bed or chair)  5 - Death   Eustace Pen MM, Creech RH, Tormey DC, et al. [phone]). "Toxicity and response criteria of the [facility]". [place] Oncol. 5 (6): 649-55    LABORATORY DATA:  Lab Results  Component Value Date   WBC 8.2 02/21/2021   HGB 13.9 02/21/2021   HCT 39.0 02/21/2021   MCV 88.2 02/21/2021   PLT 195 02/21/2021   Lab Results  Component Value Date   NA 137 02/21/2021   K 3.8 02/21/2021   CL 103  02/21/2021   CO2 25 02/21/2021   Lab Results  Component Value Date   ALT 14 02/21/2021   AST 14 (L) 02/21/2021   ALKPHOS 50 02/21/2021   BILITOT 0.6 02/21/2021      RADIOGRAPHY: No results found.     IMPRESSION/PLAN: 1. 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 Dr. Lisbeth Renshaw discusses the pathology findings and reviews the nature of left breast disease. The consensus from the breast conference includes neoadjuvant chemotherapy and targeted HER2 therapy as well as possible breast conservation with lumpectomy with  sentinel node biopsy. If she undergoes breast conservation surgery, Dr. Lisbeth Renshaw would recommend external radiotherapy to the breast  to reduce risks of local recurrence followed by antiestrogen therapy. We discussed the risks, benefits, short, and long term effects of radiotherapy, as well as the curative intent, and the patient is interested in proceeding. Dr.  Lisbeth Renshaw discusses the delivery and logistics of radiotherapy and anticipates a course of up to 6 1/2 weeks of radiotherapy to the left breast if she undergoes breast conservation. We will see her back a few weeks after surgery to discuss the simulation process and anticipate we starting radiotherapy about 4-6 weeks after breast conserving surgery.  2. Possible genetic predisposition to malignancy. The patient is a candidate for genetic testing given her personal and family history. She will meet with our geneticist today in clinic. 3. Contraceptive Counseling. The patient has had prior tubal ligation. She will not need pregnancy testing prior to any radiation.    In a visit lasting 60 minutes, greater than 50% of the time was spent face to face reviewing her case, as well as in preparation of, discussing, and coordinating the patient's care.  The above documentation reflects my direct findings during this shared patient visit. Please see the separate note by Dr. Lisbeth Renshaw on this date for the remainder of the patient's plan of care.    Carola Rhine, [facility]    **Disclaimer: This note was dictated with voice recognition software. Similar sounding words can inadvertently be transcribed and this note may contain transcription errors which may not have been corrected upon publication of note.**

## 2022-05-10 ENCOUNTER — Encounter: Payer: Self-pay | Admitting: *Deleted

## 2022-05-10 ENCOUNTER — Inpatient Hospital Stay (HOSPITAL_BASED_OUTPATIENT_CLINIC_OR_DEPARTMENT_OTHER): Payer: 59 | Admitting: Hematology and Oncology

## 2022-05-10 ENCOUNTER — Inpatient Hospital Stay: Payer: 59 | Attending: Hematology and Oncology

## 2022-05-10 ENCOUNTER — Ambulatory Visit: Payer: 59 | Attending: Surgery | Admitting: Physical Therapy

## 2022-05-10 ENCOUNTER — Encounter: Payer: Self-pay | Admitting: Genetic Counselor

## 2022-05-10 ENCOUNTER — Encounter: Payer: Self-pay | Admitting: Physical Therapy

## 2022-05-10 ENCOUNTER — Ambulatory Visit
Admission: RE | Admit: 2022-05-10 | Discharge: 2022-05-10 | Disposition: A | Discharge status: Home / Self Care | Payer: 59 | Source: Ambulatory Visit | Attending: Radiation Oncology | Admitting: Radiation Oncology

## 2022-05-10 ENCOUNTER — Other Ambulatory Visit: Payer: Self-pay

## 2022-05-10 ENCOUNTER — Inpatient Hospital Stay (HOSPITAL_BASED_OUTPATIENT_CLINIC_OR_DEPARTMENT_OTHER): Payer: 59 | Admitting: Genetic Counselor

## 2022-05-10 ENCOUNTER — Other Ambulatory Visit: Payer: Self-pay | Admitting: Surgery

## 2022-05-10 ENCOUNTER — Inpatient Hospital Stay: Payer: 59 | Admitting: Licensed Clinical Social Worker

## 2022-05-10 ENCOUNTER — Encounter: Payer: Self-pay | Admitting: Hematology and Oncology

## 2022-05-10 VITALS — BP 134/93 | HR 83 | Temp 98.1°F | Resp 18 | Ht 68.0 in | Wt 191.2 lb

## 2022-05-10 DIAGNOSIS — Z170 Estrogen receptor positive status [ER+]: Secondary | ICD-10-CM

## 2022-05-10 DIAGNOSIS — Z5111 Encounter for antineoplastic chemotherapy: Secondary | ICD-10-CM | POA: Diagnosis not present

## 2022-05-10 DIAGNOSIS — Z800 Family history of malignant neoplasm of digestive organs: Secondary | ICD-10-CM | POA: Insufficient documentation

## 2022-05-10 DIAGNOSIS — C50512 Malignant neoplasm of lower-outer quadrant of left female breast: Secondary | ICD-10-CM | POA: Diagnosis not present

## 2022-05-10 DIAGNOSIS — Z803 Family history of malignant neoplasm of breast: Secondary | ICD-10-CM | POA: Diagnosis not present

## 2022-05-10 DIAGNOSIS — C50412 Malignant neoplasm of upper-outer quadrant of left female breast: Secondary | ICD-10-CM | POA: Insufficient documentation

## 2022-05-10 DIAGNOSIS — Z5189 Encounter for other specified aftercare: Secondary | ICD-10-CM | POA: Insufficient documentation

## 2022-05-10 DIAGNOSIS — R293 Abnormal posture: Secondary | ICD-10-CM | POA: Diagnosis not present

## 2022-05-10 DIAGNOSIS — Z8041 Family history of malignant neoplasm of ovary: Secondary | ICD-10-CM | POA: Insufficient documentation

## 2022-05-10 DIAGNOSIS — Z5112 Encounter for antineoplastic immunotherapy: Secondary | ICD-10-CM | POA: Insufficient documentation

## 2022-05-10 DIAGNOSIS — C50912 Malignant neoplasm of unspecified site of left female breast: Secondary | ICD-10-CM | POA: Diagnosis not present

## 2022-05-10 HISTORY — DX: Family history of malignant neoplasm of ovary: Z80.41

## 2022-05-10 HISTORY — DX: Family history of malignant neoplasm of breast: Z80.3

## 2022-05-10 LAB — CBC WITH DIFFERENTIAL (CANCER CENTER ONLY)
Abs Immature Granulocytes: 0 10*3/uL (ref 0.00–0.07)
Basophils Absolute: 0 10*3/uL (ref 0.0–0.1)
Basophils Relative: 0 %
Eosinophils Absolute: 0.1 10*3/uL (ref 0.0–0.5)
Eosinophils Relative: 3 %
HCT: 37.7 % (ref 36.0–46.0)
Hemoglobin: 13.3 g/dL (ref 12.0–15.0)
Immature Granulocytes: 0 %
Lymphocytes Relative: 35 %
Lymphs Abs: 1.1 10*3/uL (ref 0.7–4.0)
MCH: 31.1 pg (ref 26.0–34.0)
MCHC: 35.3 g/dL (ref 30.0–36.0)
MCV: 88.1 fL (ref 80.0–100.0)
Monocytes Absolute: 0.2 10*3/uL (ref 0.1–1.0)
Monocytes Relative: 7 %
Neutro Abs: 1.7 10*3/uL (ref 1.7–7.7)
Neutrophils Relative %: 55 %
Platelet Count: 201 10*3/uL (ref 150–400)
RBC: 4.28 MIL/uL (ref 3.87–5.11)
RDW: 12.5 % (ref 11.5–15.5)
WBC Count: 3.1 10*3/uL — ABNORMAL LOW (ref 4.0–10.5)
nRBC: 0 % (ref 0.0–0.2)

## 2022-05-10 LAB — CMP (CANCER CENTER ONLY)
ALT: 8 U/L (ref 0–44)
AST: 10 U/L — ABNORMAL LOW (ref 15–41)
Albumin: 4 g/dL (ref 3.5–5.0)
Alkaline Phosphatase: 49 U/L (ref 38–126)
Anion gap: 5 (ref 5–15)
BUN: 7 mg/dL (ref 6–20)
CO2: 29 mmol/L (ref 22–32)
Calcium: 9.1 mg/dL (ref 8.9–10.3)
Chloride: 106 mmol/L (ref 98–111)
Creatinine: 0.86 mg/dL (ref 0.44–1.00)
GFR, Estimated: >60 mL/min (ref >60)
Glucose, Bld: 103 mg/dL — ABNORMAL HIGH (ref 70–99)
Potassium: 3.2 mmol/L — ABNORMAL LOW (ref 3.5–5.1)
Sodium: 140 mmol/L (ref 135–145)
Total Bilirubin: 0.6 mg/dL (ref 0.3–1.2)
Total Protein: 6.9 g/dL (ref 6.5–8.1)

## 2022-05-10 LAB — GENETIC SCREENING ORDER

## 2022-05-10 NOTE — Research (Signed)
[employer] 2021-05 - Specimen Collection Study to Evaluate Biomarkers in Subjects with Cancer   Patient [name] [name] was identified by Dr Chryl Heck as a potential candidate for the above listed study. This Clinical Research Nurse met with JULYANNA SCHOLLE, [id number], on 05/10/22 in a manner and location that ensures patient privacy to discuss participation in the above listed research study. Patient is Accompanied by her fiance, her mother, and her father . A copy of the informed consent document with embedded HIPAA language was provided to the patient. Patient reads, speaks, and understands [place].    Patient was provided with the business card of this Nurse and encouraged to contact the research team with any questions.  Approximately 10 minutes were spent with the patient reviewing the informed consent documents. Patient was provided the option of taking informed consent documents home to review and was encouraged to review at their convenience with their support network, including other care providers. Patient took the consent documents home to review.  Patient agreed to follow-up from research staff on Monday, 11/6.  Vickii Penna, RN, BSN, CPN Clinical Research Nurse I [phone]  05/10/2022 11:51 AM

## 2022-05-10 NOTE — Research (Signed)
[id number] - TREATMENT OF REFRACTORY NAUSEA  Patient [name] [name] was identified by Dr Chryl Heck as a potential candidate for the above listed study. This Clinical Research Nurse met with TAUNI SANKS, [id number], on 05/10/22 in a manner and location that ensures patient privacy to discuss participation in the above listed research study. Patient is Accompanied by her fiance, her mother, and her father . A copy of the informed consent document and separate HIPAA Authorization was provided to the patient. Patient reads, speaks, and understands [place].    Patient was provided with the business card of this Nurse and encouraged to contact the research team with any questions.  Approximately 10 minutes were spent with the patient reviewing the informed consent documents. Patient was provided the option of taking informed consent documents home to review and was encouraged to review at their convenience with their support network, including other care providers. Patient took the consent documents home to review.  Patient agreed to follow-up from research staff on Monday, 11/6.  Vickii Penna, RN, BSN, CPN Clinical Research Nurse I [phone]  05/10/2022 11:52 AM

## 2022-05-10 NOTE — Research (Signed)
S2205, ICE COMPRESS: RANDOMIZED TRIAL OF LIMB CRYOCOMPRESSION VERSUS CONTINUOUS COMPRESSION VERSUS LOW CYCLIC COMPRESSION FOR THE PREVENTION  OF TAXANE-INDUCED PERIPHERAL NEUROPATHY  Patient [name] [name] was identified by Dr Chryl Heck as a potential candidate for the above listed study. This Clinical Research Nurse met with ARILYN BRIERLEY, [id number], on 05/10/22 in a manner and location that ensures patient privacy to discuss participation in the above listed research study. Patient is Accompanied by her fiance, her mother, and her father . A copy of the informed consent document and separate HIPAA Authorization was provided to the patient. Patient reads, speaks, and understands [place].    Patient was provided with the business card of this Nurse and encouraged to contact the research team with any questions.  Approximately 10 minutes were spent with the patient reviewing the informed consent documents. Patient was provided the option of taking informed consent documents home to review and was encouraged to review at their convenience with their support network, including other care providers. Patient took the consent documents home to review.  Patient agreed to follow-up from research staff on Monday, 11/6.  Vickii Penna, RN, BSN, CPN Clinical Research Nurse I [phone]  05/10/2022 11:53 AM

## 2022-05-10 NOTE — Assessment & Plan Note (Signed)
This is a 46 year old premenopausal female patient with newly diagnosed T2 N0 M0 ER/PR and HER2 amplified invasive ductal carcinoma of the left breast referred to breast [place] for additional recommendations.  Given large tumor measuring almost 4 cm in longest dimension and HER2 amplification, we have discussed about neoadjuvant chemotherapy.  I have recommended neoadjuvant TCHP followed by surgery and adjuvant HER2 based therapy depending on response.  Once she completes adjuvant radiation, she will be a candidate for adjuvant antiestrogen therapy.    We have discussed adverse effects of chemotherapy including but not limited to fatigue, nausea, vomiting, diarrhea, increased risk of infections, neurotoxicity, cardiotoxicity.  She understands that the chemotherapy is every 21 days for 6 cycles followed by growth factor support with each cycle.  She understands that the growth factors cancer times cause diffuse bone pains.  Post chemotherapy, she will repeat imaging again and proceed with lumpectomy depending on the response.  Following surgery, she will proceed with adjuvant radiation again and antiestrogen therapy.    Genetic testing recommended. Plan Echo, MRI, chemo class to be scheduled. Port placement Anticipate chemo start in the next 1 to 2 weeks.

## 2022-05-10 NOTE — Progress Notes (Signed)
START ON PATHWAY REGIMEN - Breast ° ° °  Cycle 1: A cycle is 21 days: °    Pertuzumab  °    Trastuzumab-xxxx  °    Docetaxel  °    Carboplatin  °  Cycles 2 through 6: A cycle is every 21 days: °    Pertuzumab  °    Trastuzumab-xxxx  °    Docetaxel  °    Carboplatin  ° °**Always confirm dose/schedule in your pharmacy ordering system** ° °Patient Characteristics: °Preoperative or Nonsurgical Candidate (Clinical Staging), Neoadjuvant Therapy followed by Surgery, Invasive Disease, Chemotherapy, HER2 Positive, ER Positive °Therapeutic Status: Preoperative or Nonsurgical Candidate (Clinical Staging) °AJCC M Category: cM0 °AJCC Grade: G2 °Breast Surgical Plan: Neoadjuvant Therapy followed by Surgery °ER Status: Positive (+) °AJCC 8 Stage Grouping: IB °HER2 Status: Positive (+) °AJCC T Category: cT2 °AJCC N Category: cN0 °PR Status: Positive (+) °Intent of Therapy: °Curative Intent, Discussed with Patient °

## 2022-05-10 NOTE — Therapy (Signed)
OUTPATIENT PHYSICAL THERAPY BREAST CANCER BASELINE EVALUATION   Patient Name: [name] [name] MRN: [id number] DOB:1975-10-12, 46 y.o., female Today's Date: 05/10/2022   PT End of Session - 05/10/22 1136     Visit Number 1    Number of Visits 2    Date for PT Re-Evaluation 11/08/22    PT Start Time 0938    PT Stop Time 0948   Also saw pt from 1033-1055 and from 1122-1127 for a total of 37 min   PT Time Calculation (min) 10 min    Activity Tolerance Patient tolerated treatment well    Behavior During Therapy WFL for tasks assessed/performed             Past Medical History:  Diagnosis Date   Family history of adverse reaction to anesthesia    mother slow to awaken   GERD (gastroesophageal reflux disease)    History of kidney stones    last stone 6 months ago    Migraine    Seasonal allergies    Past Surgical History:  Procedure Laterality Date   [place], URETEROSCOPY AND STENT PLACEMENT Left 02/22/2021   Procedure: [place], URETEROSCOPY AND STENT PLACEMENT;  Surgeon: Remi Haggard, MD;  Location: [facility];  Service: Urology;  Laterality: Left;   CYSTOSCOPY/URETEROSCOPY/HOLMIUM LASER/STENT PLACEMENT Left 03/10/2021   Procedure: CYSTOSCOPY RETROGRADE, LEFT URETEROSCOPY/ STONE BASKETRY [place] LASER/STENT EXCHANGE;  Surgeon: Ardis Hughs, MD;  Location: [facility];  Service: Urology;  Laterality: Left;   ESOPHAGOGASTRODUODENOSCOPY N/A 11/17/2013   Procedure: ESOPHAGOGASTRODUODENOSCOPY (EGD);  Surgeon: Milus Banister, MD;  Location: [place];  Service: Endoscopy;  Laterality: N/A;   ESOPHAGOGASTRODUODENOSCOPY N/A 10/15/2015   Procedure: ESOPHAGOGASTRODUODENOSCOPY (EGD);  Surgeon: Gatha Mayer, MD;  Location: [facility];  Service: Endoscopy;  Laterality: N/A;   HOLMIUM LASER APPLICATION  0/09/5463   Procedure: HOLMIUM LASER APPLICATION;  Surgeon: Ardis Hughs, MD;  Location: [facility];  Service: Urology;;   NASAL SEPTOPLASTY W/ TURBINOPLASTY Bilateral 03/18/2018   Procedure: BILATERAL NASAL SEPTOPLASTY WITH TURBINATE REDUCTION;  Surgeon: Jodi Marble, MD;  Location: [place];  Service: ENT;  Laterality: Bilateral;   TONSILLECTOMY  1991   TUBAL LIGATION  2005   Patient Active Problem List   Diagnosis Date Noted   Malignant neoplasm of upper-outer quadrant of left breast in female, estrogen receptor positive ([place]) 05/08/2022   Food impaction of esophagus    Schatzki's ring    Hx of migraine headaches 11/17/2013   Ureteral calculus 11/17/2013   GERD (gastroesophageal reflux disease) 11/17/2013   Esophageal obstruction due to food impaction 11/17/2013    REFERRING PROVIDER: Dr. [name]  REFERRING DIAG: Left breast cancer  THERAPY DIAG:  Malignant neoplasm of lower-outer quadrant of left breast of female, estrogen receptor positive ([place])  Abnormal posture  Rationale for Evaluation and Treatment: Rehabilitation  ONSET DATE: 04/25/2022  SUBJECTIVE:  SUBJECTIVE STATEMENT: Patient reports she is here today to be seen by her medical team for her newly diagnosed left breast cancer.   PERTINENT HISTORY:  Patient was diagnosed on 04/25/2022 with left grade 2 invasive ductal carcinoma breast cancer. It measures 3.9 cm and is located in the lower outer quadrant. It is triple positive with a Ki67 of 50%.   PATIENT GOALS:   reduce lymphedema risk and learn post op HEP.   PAIN:  Are you having pain? No  PRECAUTIONS: Active CA   HAND DOMINANCE: left  WEIGHT BEARING RESTRICTIONS: No  FALLS:  Has patient fallen in last 6 months? 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)  LEISURE: She does not exercise  PRIOR LEVEL OF FUNCTION: Independent   OBJECTIVE:  COGNITION: Overall cognitive status: Within functional limits for tasks assessed    POSTURE:  Forward head and rounded shoulders posture  UPPER EXTREMITY AROM/PROM:  A/PROM RIGHT   eval   Shoulder extension 48  Shoulder flexion 156  Shoulder abduction 178  Shoulder internal rotation 64  Shoulder external rotation 90    (Blank rows = not tested)  A/PROM LEFT   eval  Shoulder extension 55  Shoulder flexion 147  Shoulder abduction 170  Shoulder internal rotation 71  Shoulder external rotation 75    (Blank rows = not tested)   CERVICAL AROM: All within normal limits   UPPER EXTREMITY STRENGTH: WNL  LYMPHEDEMA ASSESSMENTS:   LANDMARK RIGHT   eval  10 cm proximal to olecranon process 30.3  Olecranon process 26  10 cm proximal to ulnar styloid process 23.8  Just proximal to ulnar styloid process 16.5  Across hand at thumb web space 18.9  At base of 2nd digit 6.1  (Blank rows = not tested)  LANDMARK LEFT   eval  10 cm proximal to olecranon process 31.3  Olecranon process 25.2  10 cm proximal to ulnar styloid process 21.5  Just proximal to ulnar styloid process 16.1  Across hand at thumb web space 18.6  At base of 2nd digit 6  (Blank rows = not tested)   L-DEX LYMPHEDEMA SCREENING:  The patient was assessed using the L-Dex machine today to produce a lymphedema index baseline score. The patient will be reassessed on a regular basis (typically every 3 months) to obtain new L-Dex scores. If the score is > 6.5 points away from his/her baseline score indicating onset of subclinical lymphedema, it will be recommended to wear a compression garment for 4 weeks, 12 hours per day and then be reassessed. If the score continues to be > 6.5 points from baseline at reassessment,  we will initiate lymphedema treatment. Assessing in this manner has a 95% rate of preventing clinically significant lymphedema.   L-DEX FLOWSHEETS - 05/10/22 1100       L-DEX LYMPHEDEMA SCREENING   Measurement Type Unilateral    L-DEX MEASUREMENT EXTREMITY Upper Extremity    POSITION  Standing    DOMINANT SIDE Right    At Risk Side Left    BASELINE SCORE (UNILATERAL) -0.6              QUICK DASH SURVEY:  [name] - 05/10/22 0001     Open a tight or new jar No difficulty    Do heavy household chores (wash walls, wash floors) No difficulty    Carry a shopping bag or  briefcase No difficulty    Wash your back No difficulty    Use a knife to cut food No difficulty    Recreational activities in which you take some force or impact through your arm, shoulder, or hand (golf, hammering, tennis) No difficulty    During the past week, to what extent has your arm, shoulder or hand problem interfered with your normal social activities with family, friends, neighbors, or groups? Not at all    During the past week, to what extent has your arm, shoulder or hand problem limited your work or other regular daily activities Not at all    Arm, shoulder, or hand pain. None    Tingling (pins and needles) in your arm, shoulder, or hand None    Difficulty Sleeping No difficulty    DASH Score 0 %              PATIENT EDUCATION:  Education details: Lymphedema risk reduction and post op shoulder/posture HEP Person educated: Patient Education method: Explanation, Demonstration, Handout Education comprehension: Patient verbalized understanding and returned demonstration  HOME EXERCISE PROGRAM: Patient was instructed today in a home exercise program today for post op shoulder range of motion. These included active assist shoulder flexion in sitting, scapular retraction, wall walking with shoulder abduction, and hands behind head external rotation.  She was encouraged to do these twice a day,  holding 3 seconds and repeating 5 times when permitted by her physician.   ASSESSMENT:  CLINICAL IMPRESSION: Patient was diagnosed on 04/25/2022 with left grade 2 invasive ductal carcinoma breast cancer. It measures 3.9 cm and is located in the lower outer quadrant. It is triple positive with a Ki67 of 50%. Her multidisciplinary medical team met prior to her assessments to determine a recommended treatment plan. She is planning to have neoadjuvant chemotherapy followed by a left lumpectomy and sentinel node biopsy, radiation, and anti-estrogen therapy. She will benefit from a post op PT reassessment to determine needs and from L-Dex screens every 3 months for 2 years to detect subclinical lymphedema.  Pt will benefit from skilled therapeutic intervention to improve on the following deficits: Decreased knowledge of precautions, impaired UE functional use, pain, decreased ROM, postural dysfunction.   PT treatment/interventions: ADL/self-care home management, pt/family education, therapeutic exercise  REHAB POTENTIAL: Excellent  CLINICAL DECISION MAKING: Stable/uncomplicated  EVALUATION COMPLEXITY: Low   GOALS: Goals reviewed with patient? YES  LONG TERM GOALS: (STG=LTG)    Name Target Date Goal status  1 Pt will be able to verbalize understanding of pertinent lymphedema risk reduction practices relevant to her dx specifically related to skin care.  Baseline:  No knowledge 05/10/2022 Achieved at eval  2 Pt will be able to return demo and/or verbalize understanding of the post op HEP related to regaining shoulder ROM. Baseline:  No knowledge 05/10/2022 Achieved at eval  3 Pt will be able to verbalize understanding of the importance of attending the post op After Breast CA Class for further lymphedema risk reduction education and therapeutic exercise.  Baseline:  No knowledge 05/10/2022 Achieved at eval  4 Pt will demo she has regained full shoulder ROM and function post operatively compared  to baselines.  Baseline: See objective measurements taken today. 11/08/2022      PLAN:  PT FREQUENCY/DURATION: EVAL and 1 follow up appointment.   PLAN FOR NEXT SESSION: will reassess 3-4 weeks post op to determine needs.   Patient will follow up at outpatient cancer rehab 3-4 weeks following surgery.  If the  patient requires physical therapy at that time, a specific plan will be dictated and sent to the referring physician for approval. The patient was educated today on appropriate basic range of motion exercises to begin post operatively and the importance of attending the After Breast Cancer class following surgery.  Patient was educated today on lymphedema risk reduction practices as it pertains to recommendations that will benefit the patient immediately following surgery.  She verbalized good understanding.    Physical Therapy Information for After Breast Cancer Surgery/Treatment:  Lymphedema is a swelling condition that you may be at risk for in your arm if you have lymph nodes removed from the armpit area.  After a sentinel node biopsy, the risk is approximately 5-9% and is higher after an axillary node dissection.  There is treatment available for this condition and it is not life-threatening.  Contact your physician or physical therapist with concerns. You may begin the 4 shoulder/posture exercises (see additional sheet) when permitted by your physician (typically a week after surgery).  If you have drains, you may need to wait until those are removed before beginning range of motion exercises.  A general recommendation is to not lift your arms above shoulder height until drains are removed.  These exercises should be done to your tolerance and gently.  This is not a "no pain/no gain" type of recovery so listen to your body and stretch into the range of motion that you can tolerate, stopping if you have pain.  If you are having immediate reconstruction, ask your plastic surgeon about doing  exercises as he or she may want you to wait. We encourage you to attend the free one time ABC (After Breast Cancer) class offered by [facility].  You will learn information related to lymphedema risk, prevention and treatment and additional exercises to regain mobility following surgery.  You can call [phone] for more information.  This is offered the 1st and 3rd Monday of each month.  You only attend the class one time. While undergoing any medical procedure or treatment, try to avoid blood pressure being taken or needle sticks from occurring on the arm on the side of cancer.   This recommendation begins after surgery and continues for the rest of your life.  This may help reduce your risk of getting lymphedema (swelling in your arm). An excellent resource for those seeking information on lymphedema is the National Lymphedema Network's web site. It can be accessed at [place].org If you notice swelling in your hand, arm or breast at any time following surgery (even if it is many years from now), please contact your doctor or physical therapist to discuss this.  Lymphedema can be treated at any time but it is easier for you if it is treated early on.  If you feel like your shoulder motion is not returning to normal in a reasonable amount of time, please contact your surgeon or physical therapist.  [facility] [phone]. [address], [address], [place] [address]  ABC CLASS After Breast Cancer Class  After Breast Cancer Class is a specially designed exercise class to assist you in a safe recover after having breast cancer surgery.  In this class you will learn how to get back to full function whether your drains were just removed or if you had surgery a month ago.  This one-time class is held the 1st and 3rd Monday of every month from 11:00 a.m. until 12:00 noon virtually.  This class is FREE and space is limited. For more  information or to register for the next available class, call [phone].  Class Goals  Understand specific stretches to improve the flexibility of you chest and shoulder. Learn ways to safely strengthen your upper body and improve your posture. Understand the warning signs of infection and why you may be at risk for an arm infection. Learn about Lymphedema and prevention.  ** You do not attend this class until after surgery.  Drains must be removed to participate  Patient was instructed today in a home exercise program today for post op shoulder range of motion. These included active assist shoulder flexion in sitting, scapular retraction, wall walking with shoulder abduction, and hands behind head external rotation.  She was encouraged to do these twice a day, holding 3 seconds and repeating 5 times when permitted by her physician.  Annia Friendly, [place] 05/10/22 11:48 AM

## 2022-05-10 NOTE — Progress Notes (Signed)
[facility] NOTE  Patient Care Team: Patient, No Pcp Per as PCP - General (General Practice) [name], MD as Consulting Physician (General Surgery) Benay Pike, MD as Consulting Physician (Hematology and Oncology) Kyung Rudd, MD as Consulting Physician (Radiation Oncology) Rockwell Germany, RN as Oncology Nurse Navigator Mauro Kaufmann, RN as Oncology Nurse Navigator  CHIEF COMPLAINTS/PURPOSE OF CONSULTATION:  Newly diagnosed breast cancer  HISTORY OF PRESENTING ILLNESS:  [name] [name] 46 y.o. female is here because of recent diagnosis of left   I reviewed her records extensively and collaborated the history with the patient.  SUMMARY OF ONCOLOGIC HISTORY: Oncology History  Malignant neoplasm of upper-outer quadrant of left breast in female, estrogen receptor positive ([place])  04/25/2022 Imaging   Patient had diagnostic bilateral mammogram given palpable mass in the left breast.  This showed a 2.6 x 3.5 x 3 cm irregular mass as well as possible satellite mass which is indeterminate.  An ultrasound was recommended.  Ultrasound of the left breast mass showed 3.9 x 2.4 x 2.3 cm irregular mass with an irregular and spiculated margin at 3:00 middle depth 5 cm from the nipple, irregular mass is hypoechoic.  In the left breast at 1 o'clock position, 3 cm from the nipple there is a round mass measuring 0.3 x 0.2 x 0.3 cm.  No significant abnormalities were seen sonographically in the left axilla.  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  Left breast needle core biopsy at 1:00 3 cm from the nipple showed fibrocystic changes with usual ductal hyperplasia negative for malignancy.  Prognostic showed ER 95% PR 40%, HER2 3+  KI of 50%   05/08/2022 Initial Diagnosis   Malignant neoplasm of upper-outer quadrant of left breast in female, estrogen receptor positive ([place])   05/08/2022 Cancer Staging   Staging form: Breast, AJCC 8th Edition - Clinical stage from 05/08/2022: Stage IB (cT2, cN0, cM0, G2, ER+, PR+, HER2+) - Signed by Hayden Pedro, PA-C on 05/08/2022 Stage prefix: Initial diagnosis Method of lymph node assessment: Clinical Histologic grading system: 3 grade system   05/17/2022 -  Chemotherapy   Patient is on Treatment Plan : BREAST  Docetaxel + Carboplatin + Trastuzumab + Pertuzumab  (TCHP) q21d       Patient arrived to the appointment today with her fianc, her mother and father.  She has noted nipple discharge for about 3 months or so, she also tells me that the mass in the breast may have been there for a few months although she noticed it more definitively around a few weeks ago. She does have family history of ovarian cancer maternal grandmother and maternal great aunts with breast cancer in their 41s.  Besides this new diagnosis of breast cancer, she is very healthy, denies any major medical issues except for history of kidney stones and migraines.    Rest of the pertinent 10 point ROS reviewed and negative  MEDICAL HISTORY:  Past Medical History:  Diagnosis Date   Family history of adverse reaction to anesthesia    mother slow to awaken   GERD (gastroesophageal reflux disease)    History of kidney stones    last stone 6 months ago    Migraine    Seasonal allergies  SURGICAL HISTORY: Past Surgical History:  Procedure Laterality Date   ADENOIDECTOMY  1991   CYSTOSCOPY WITH RETROGRADE PYELOGRAM, URETEROSCOPY AND STENT PLACEMENT Left 02/22/2021   Procedure: CYSTOSCOPY WITH RETROGRADE PYELOGRAM, URETEROSCOPY AND STENT PLACEMENT;  Surgeon: Remi Haggard, MD;  [facility];  Service: Urology;  Laterality: Left;   CYSTOSCOPY/URETEROSCOPY/HOLMIUM LASER/STENT PLACEMENT Left  03/10/2021   Procedure: CYSTOSCOPY RETROGRADE, LEFT URETEROSCOPY/ STONE BASKETRY [place] LASER/STENT EXCHANGE;  Surgeon: Ardis Hughs, MD;  Location: [facility];  Service: Urology;  Laterality: Left;   ESOPHAGOGASTRODUODENOSCOPY N/A 11/17/2013   Procedure: ESOPHAGOGASTRODUODENOSCOPY (EGD);  Surgeon: Milus Banister, MD;  Location: [place];  Service: Endoscopy;  Laterality: N/A;   ESOPHAGOGASTRODUODENOSCOPY N/A 10/15/2015   Procedure: ESOPHAGOGASTRODUODENOSCOPY (EGD);  Surgeon: Gatha Mayer, MD;  [facility];  Service: Endoscopy;  Laterality: N/A;   HOLMIUM LASER APPLICATION  12/11/8935   Procedure: HOLMIUM LASER APPLICATION;  Surgeon: Ardis Hughs, MD;  Location: [facility];  Service: Urology;;   NASAL SEPTOPLASTY W/ TURBINOPLASTY Bilateral 03/18/2018   Procedure: BILATERAL NASAL SEPTOPLASTY WITH TURBINATE REDUCTION;  Surgeon: Jodi Marble, MD;  Location: [place];  Service: ENT;  Laterality: Bilateral;   TONSILLECTOMY  1991   TUBAL LIGATION  2005    SOCIAL HISTORY: Social History   Socioeconomic History   Marital status: Single    Spouse name: Not on file   Number of children: Not on file   Years of education: Not on file   Highest education level: Not on file  Occupational History   Not on file  Tobacco Use   Smoking status: Never   Smokeless tobacco: Never  Vaping Use   Vaping Use: Never used  Substance and Sexual Activity   Alcohol use: No   Drug use: No   Sexual activity: Not on file  Other Topics Concern   Not on file  Social History Narrative   ** Merged History Encounter **       Social Determinants of Health   Financial Resource Strain: Not on file  Food Insecurity: Not on file  Transportation Needs: Not on file  Physical Activity: Not on file  Stress: Not on file  Social Connections: Not on file  Intimate Partner Violence: Not on file    FAMILY HISTORY: Family History   Problem Relation Age of Onset   Asthma Mother    Diabetes Mother    Colon polyps Mother    Esophageal cancer Maternal Grandfather    Ovarian cancer Maternal Grandmother    Diabetes Maternal Grandmother     ALLERGIES:  is allergic to amoxicillin, penicillins, duloxetine, and hydrocodone.  MEDICATIONS:  Current Outpatient Medications  Medication Sig Dispense Refill   acetaminophen (TYLENOL) 325 MG tablet Take 650 mg by mouth.     omeprazole (PRILOSEC) 20 MG capsule Take 20 mg by mouth daily.     No current facility-administered medications for this visit.    REVIEW OF SYSTEMS:   Constitutional: Denies fevers, chills or abnormal night sweats Eyes: Denies blurriness of vision, double vision or watery eyes Ears, nose, mouth, throat, and face: Denies mucositis or sore throat Respiratory: Denies cough, dyspnea or wheezes Cardiovascular: Denies palpitation, chest discomfort or lower extremity swelling Gastrointestinal:  Denies nausea, heartburn or change in bowel habits Skin: Denies abnormal skin rashes Lymphatics: Denies new lymphadenopathy or easy bruising Neurological:Denies numbness, tingling or new weaknesses Behavioral/Psych: Mood is stable, no new changes  Breast:  Denies any palpable lumps or discharge All other  systems were reviewed with the patient and are negative.  PHYSICAL EXAMINATION: ECOG PERFORMANCE STATUS: 0 - Asymptomatic  Vitals:   05/10/22 0857  BP: (!) 134/93  Pulse: 83  Resp: 18  Temp: 98.1 F (36.7 C)  SpO2: 97%   Filed Weights   05/10/22 0857  Weight: 191 lb 3.2 oz (86.7 kg)    GENERAL:alert, no distress and comfortable PSYCH: alert & oriented x 3 with fluent speech NEURO: no focal motor/sensory deficits BREAST: Palpable left breast lower outer quadrant mass measuring approximately 3 to 4 cm in largest dimension.  No palpable regional adenopathy.  LABORATORY DATA:  I have reviewed the data as listed Lab Results  Component Value Date   WBC  3.1 (L) 05/10/2022   HGB 13.3 05/10/2022   HCT 37.7 05/10/2022   MCV 88.1 05/10/2022   PLT 201 05/10/2022   Lab Results  Component Value Date   NA 140 05/10/2022   K 3.2 (L) 05/10/2022   CL 106 05/10/2022   CO2 29 05/10/2022    RADIOGRAPHIC STUDIES: I have personally reviewed the radiological reports and agreed with the findings in the report.  ASSESSMENT AND PLAN:  Malignant neoplasm of upper-outer quadrant of left breast in female, estrogen receptor positive ([place]) This is a 46 year old premenopausal female patient with newly diagnosed T2 N0 M0 ER/PR and HER2 amplified invasive ductal carcinoma of the left breast referred to breast [place] for additional recommendations.  Given large tumor measuring almost 4 cm in longest dimension and HER2 amplification, we have discussed about neoadjuvant chemotherapy.  I have recommended neoadjuvant TCHP followed by surgery and adjuvant HER2 based therapy depending on response.  Once she completes adjuvant radiation, she will be a candidate for adjuvant antiestrogen therapy.    We have discussed adverse effects of chemotherapy including but not limited to fatigue, nausea, vomiting, diarrhea, increased risk of infections, neurotoxicity, cardiotoxicity.  She understands that the chemotherapy is every 21 days for 6 cycles followed by growth factor support with each cycle.  She understands that the growth factors cancer times cause diffuse bone pains.  Post chemotherapy, she will repeat imaging again and proceed with lumpectomy depending on the response.  Following surgery, she will proceed with adjuvant radiation again and antiestrogen therapy.    Genetic testing recommended. Plan Echo, MRI, chemo class to be scheduled. Port placement Anticipate chemo start in the next 1 to 2 weeks.  Total time spent: 60 minutes including history, physical exam, review of records, counseling and coordination of care All questions were answered. The patient knows to call  the clinic with any problems, questions or concerns.    Benay Pike, MD 05/10/22

## 2022-05-10 NOTE — Progress Notes (Signed)
REFERRING PROVIDER: Benay Pike, MD [place],  [place] [address]  PRIMARY PROVIDER:  None listed  PRIMARY REASON FOR VISIT:  1. Malignant neoplasm of upper-outer quadrant of left breast in female, estrogen receptor positive ([place])   2. Family history of breast cancer   3. Family history of ovarian cancer     HISTORY OF PRESENT ILLNESS:   [name] [name], a 46 y.o. female, was seen for a [place] cancer genetics consultation at the request of Dr. Chryl Heck due to a personal history of breast cancer.  [name] [name] presents to clinic today to discuss the possibility of a hereditary predisposition to cancer, to discuss genetic testing, and to further clarify her future cancer risks, as well as potential cancer risks for family members.   In October 2023, at the age of 67, [name] [name] was diagnosed with invasive ductal carcinoma of the right breast (ER+/PR+/HER2+). The treatment plan includes neoadjuvant chemotherapy.    CANCER HISTORY:  Oncology History  Malignant neoplasm of upper-outer quadrant of left breast in female, estrogen receptor positive ([place])  04/25/2022 Imaging   Patient had diagnostic bilateral mammogram given palpable mass in the left breast.  This showed a 2.6 x 3.5 x 3 cm irregular mass as well as possible satellite mass which is indeterminate.  An ultrasound was recommended.  Ultrasound of the left breast mass showed 3.9 x 2.4 x 2.3 cm irregular mass with an irregular and spiculated margin at 3:00 middle depth 5 cm from the nipple, irregular mass is hypoechoic.  In the left breast at 1 o'clock position, 3 cm from the nipple there is a round mass measuring 0.3 x 0.2 x 0.3 cm.  No significant abnormalities were seen sonographically in the left axilla.  There is another heterogeneous shadowing present at 3 o'clock position 3 cm from the nipple measuring 8 x 8 x 11 mm possible correlate for mammographic finding   05/02/2022 Pathology Results   Left breast needle core  biopsy showed invasive ductal carcinoma, grade 2, DCIS with necrosis.  Left breast needle core biopsy at 1:00 3 cm from the nipple showed fibrocystic changes with usual ductal hyperplasia negative for malignancy.  Prognostic showed ER 95% PR 40%, HER2 3+ KI of 50%   05/08/2022 Initial Diagnosis   Malignant neoplasm of upper-outer quadrant of left breast in female, estrogen receptor positive ([place])   05/08/2022 Cancer Staging   Staging form: Breast, AJCC 8th Edition - Clinical stage from 05/08/2022: Stage IB (cT2, cN0, cM0, G2, ER+, PR+, HER2+) - Signed by Hayden Pedro, PA-C on 05/08/2022 Stage prefix: Initial diagnosis Method of lymph node assessment: Clinical Histologic grading system: 3 grade system   05/17/2022 -  Chemotherapy   Patient is on Treatment Plan : BREAST  Docetaxel + Carboplatin + Trastuzumab + Pertuzumab  (TCHP) q21d        Past Medical History:  Diagnosis Date   Family history of adverse reaction to anesthesia    mother slow to awaken   Family history of breast cancer 05/10/2022   Family history of ovarian cancer 05/10/2022   GERD (gastroesophageal reflux disease)    History of kidney stones    last stone 6 months ago    Migraine    Seasonal allergies     Past Surgical History:  Procedure Laterality Date   ADENOIDECTOMY  1991   CYSTOSCOPY WITH RETROGRADE PYELOGRAM, URETEROSCOPY AND STENT PLACEMENT Left 02/22/2021   Procedure: [place], URETEROSCOPY AND STENT PLACEMENT;  Surgeon:  Remi Haggard, MD;  Location: [facility];  Service: Urology;  Laterality: Left;   CYSTOSCOPY/URETEROSCOPY/HOLMIUM LASER/STENT PLACEMENT Left 03/10/2021   Procedure: CYSTOSCOPY RETROGRADE, LEFT URETEROSCOPY/ STONE BASKETRY [place] LASER/STENT EXCHANGE;  Surgeon: Ardis Hughs, MD;  Location[facility];  Service: Urology;  Laterality: Left;   ESOPHAGOGASTRODUODENOSCOPY N/A 11/17/2013   Procedure: ESOPHAGOGASTRODUODENOSCOPY (EGD);   Surgeon: Milus Banister, MD;  Location: [place];  Service: Endoscopy;  Laterality: N/A;   ESOPHAGOGASTRODUODENOSCOPY N/A 10/15/2015   Procedure: ESOPHAGOGASTRODUODENOSCOPY (EGD);  Surgeon: Gatha Mayer, MD;  [facility];  Service: Endoscopy;  Laterality: N/A;   HOLMIUM LASER APPLICATION  12/10/9474   Procedure: HOLMIUM LASER APPLICATION;  Surgeon: Ardis Hughs, MD;  Location[facility];  Service: Urology;;   NASAL SEPTOPLASTY W/ TURBINOPLASTY Bilateral 03/18/2018   Procedure: BILATERAL NASAL SEPTOPLASTY WITH TURBINATE REDUCTION;  Surgeon: Jodi Marble, MD;  Location: [place];  Service: ENT;  Laterality: Bilateral;   TONSILLECTOMY  1991   TUBAL LIGATION  2005    FAMILY HISTORY:  We obtained a detailed, 4-generation family history.  Significant diagnoses are listed below: Family History  Problem Relation Age of Onset   Colon polyps Mother    Ovarian cancer Maternal Grandmother 37   Esophageal cancer Maternal Grandfather        d. 77   Breast cancer Other        MGM's sisters x2   Cervical cancer Cousin        mat cousin; dx <40    [name] [name] is unaware of previous family history of genetic testing for hereditary cancer risks. There is no reported Ashkenazi Jewish ancestry. There is no known consanguinity.  GENETIC COUNSELING ASSESSMENT: [name] [name] is a 46 y.o. female with a personal and family history which is somewhat suggestive of a hereditary cancer syndrome and predisposition to cancer given the ages of diagnosis and the presence of related cancers (breast/ovarian) in her maternal family. We, therefore, discussed and recommended the following at today's visit.   DISCUSSION: We discussed that 5 - 10% of cancer is hereditary.  Most cases of hereditary breast cancer are associated with mutations in BRCA1/2.  There are other genes that can be associated with hereditary breast and ovarian cancer syndromes.  We discussed that  testing is beneficial for several reasons including knowing how to follow individuals for their cancer risks, identifying whether potential treatment options would be beneficial, and understanding if other family members could be at risk for cancer and allowing them to undergo genetic testing.   We reviewed the characteristics, features and inheritance patterns of hereditary cancer syndromes. We also discussed genetic testing, including the appropriate family members to test, the process of testing, insurance coverage and turn-around-time for results. We discussed the implications of a negative, positive, and/or variant of uncertain significant result.  We recommended [name] [name] pursue genetic testing for a panel that includes genes associated with breast, ovarian, and other cancers.   The CustomNext-Cancer+RNAinsight panel offered by [facility] includes sequencing and rearrangement analysis for the following 47 genes:  APC, ATM, AXIN2, BARD1, BMPR1A, BRCA1, BRCA2, BRIP1, CDH1, CDK4, CDKN2A, CHEK2, DICER1, EPCAM, GREM1, HOXB13, MEN1, MLH1, MSH2, MSH3, MSH6, MUTYH, NBN, NF1, NF2, NTHL1, PALB2, PMS2, POLD1, POLE, PTEN, RAD51C, RAD51D, RECQL, RET, SDHA, SDHAF2, SDHB, SDHC, SDHD, SMAD4, SMARCA4, STK11, TP53, TSC1, TSC2, and VHL.  RNA data is routinely analyzed for use in variant interpretation for all genes.  Based on [name] [name]'s personal history of  breast cancer before age 17, she meets medical criteria for genetic testing. Despite that she meets criteria, she may still have an out of pocket cost. We discussed that if her out of pocket cost for testing is over $100, the laboratory should contact her and discuss the self-pay prices and/or patient pay assistance programs.    PLAN: After considering the risks, benefits, and limitations, [name] [name] provided informed consent to pursue genetic testing and the blood sample was sent to [facility] for analysis of the CustomNext-Cancer +RNAinsight  Panel. Results should be available within approximately 3 weeks' time, at which point they will be disclosed by telephone to [name] [name], as will any additional recommendations warranted by these results. [name] [name] will receive a summary of her genetic counseling visit and a copy of her results once available. This information will also be available in Epic.    [name] [name] questions were answered to her satisfaction today. Our contact information was provided should additional questions or concerns arise. Thank you for the referral and allowing [place] to share in the care of your patient.   Lucion Dilger M. Joette Catching, [place], [facility] Genetic Counselor [name] (P) [phone]  The patient was seen for a total of 15 minutes in face-to-face genetic counseling.  She was accompanied by her mother, stepfather, and fiance. Dr. Chryl Heck was available to discuss this case as needed.    _______________________________________________________________________ For Office Staff:  Number of people involved in session: 2 Was an Intern/ student involved with case: no

## 2022-05-10 NOTE — Progress Notes (Signed)
[name] Work  Initial Assessment   [name] [name] is a 46 y.o. year old female accompanied by mother, father, and partner. Clinical Social Work was referred by  [facility]  for assessment of psychosocial needs.   SDOH (Social Determinants of Health) assessments performed: Yes SDOH Interventions    Flowsheet Row Clinical Support from 05/10/2022 in [facility]  SDOH Interventions   Food Insecurity Interventions Intervention Not Indicated  Transportation Interventions Intervention Not Indicated  Financial Strain Interventions Intervention Not Indicated       SDOH Screenings   Food Insecurity: No Food Insecurity (05/10/2022)  Transportation Needs: No Transportation Needs (05/10/2022)  Financial Resource Strain: Low Risk  (05/10/2022)  Tobacco Use: Low Risk  (05/10/2022)     Distress Screen completed: Yes    05/10/2022    1:16 PM  ONCBCN DISTRESS SCREENING  Screening Type Initial Screening  Distress experienced in past week (1-10) 5  Emotional problem type Nervousness/Anxiety  Physical Problem type Pain;Sleep/insomnia;Breathing;Loss of appetitie      Family/Social Information:  Housing Arrangement: patient lives with partner, youngest son (25). Two older sons (68 & 74) live in [place] Family members/support persons in your life? 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  Income source: Employment Financial concerns: No Type of concern: None Food access concerns: no Religious or spiritual practice: Not known Services Currently in place:  n/a  Coping/ Adjustment to diagnosis: Patient understands treatment plan and what happens next? Pt is still processing all of the information provided today by medical team but voices understanding Concerns about diagnosis and/or treatment: Overwhelmed by information Patient reported stressors:  Adjusting to my illness Patient enjoys crafts and video games, time with her grandkids (8 & 6yo, 1mo one on the way) Current coping skills/ strengths: Motivation for treatment/growth  and Supportive family/friends     SUMMARY: Current SDOH Barriers:  None noted today  Clinical Social Work Clinical Goal(s):  No clinical social work goals at this time  Interventions: Discussed common feeling and emotions when being diagnosed with cancer, and the importance of support during treatment Informed patient of the support team roles and support services at [facility] and encouraged patient to call with any questions or concerns   Follow Up Plan: Patient will contact CSW with any support or resource needs Patient verbalizes understanding of plan: Yes    [name] [name] E Carrina Schoenberger, LCSW

## 2022-05-11 ENCOUNTER — Other Ambulatory Visit: Payer: Self-pay

## 2022-05-11 ENCOUNTER — Telehealth: Payer: Self-pay | Admitting: Genetic Counselor

## 2022-05-11 NOTE — Telephone Encounter (Signed)
Leatha's mother, Katharine Look, called to update family history of cancer  Brogan's MGM's sister, Stanton Kidney, had uterine cancer (dx before 50), colon cancer (dx 105s), and bone marrow (dx 8s)-- d. 71 Naika's MGM's mother had liver cancer (? Primary) and d. 32s Shelaine's MGF's motehr had "female cancer" diagnosed at young age

## 2022-05-12 ENCOUNTER — Encounter: Payer: Self-pay | Admitting: Hematology and Oncology

## 2022-05-12 NOTE — Progress Notes (Signed)
Pharmacist Chemotherapy Monitoring - Initial Assessment    Anticipated start date: 05/18/22   The following has been reviewed per standard work regarding the patient's treatment regimen: The patient's diagnosis, treatment plan and drug doses, and organ/hematologic function Lab orders and baseline tests specific to treatment regimen  The treatment plan start date, drug sequencing, and pre-medications Prior authorization status  Patient's documented medication list, including drug-drug interaction screen and prescriptions for anti-emetics and supportive care specific to the treatment regimen The drug concentrations, fluid compatibility, administration routes, and timing of the medications to be used The patient's access for treatment and lifetime cumulative dose history, if applicable  The patient's medication allergies and previous infusion related reactions, if applicable   Changes made to treatment plan:  treatment plan date  Follow up needed:  Pending authorization for treatment    Judge Stall, [facility], 05/12/2022  11:26 AM

## 2022-05-15 ENCOUNTER — Ambulatory Visit
Admission: RE | Admit: 2022-05-15 | Discharge: 2022-05-15 | Disposition: A | Discharge status: Home / Self Care | Payer: 59 | Source: Ambulatory Visit | Attending: Hematology and Oncology | Admitting: Hematology and Oncology

## 2022-05-15 ENCOUNTER — Telehealth: Payer: Self-pay

## 2022-05-15 DIAGNOSIS — N6011 Diffuse cystic mastopathy of right breast: Secondary | ICD-10-CM | POA: Diagnosis not present

## 2022-05-15 DIAGNOSIS — C50412 Malignant neoplasm of upper-outer quadrant of left female breast: Secondary | ICD-10-CM

## 2022-05-15 MED ORDER — GADOPICLENOL 0.5 MMOL/ML IV SOLN
10.0000 mL | Freq: Once | INTRAVENOUS | Status: AC | PRN
  Administered 2022-05-15: 10 mL via INTRAVENOUS

## 2022-05-15 NOTE — Telephone Encounter (Signed)
[employer] 2021-05 - Specimen Collection Study to Evaluate Biomarkers in Subjects with Cancer   URCC-16070 - TREATMENT OF REFRACTORY NAUSEA  S2205, ICE COMPRESS: RANDOMIZED TRIAL OF LIMB CRYOCOMPRESSION VERSUS CONTINUOUS COMPRESSION VERSUS LOW CYCLIC COMPRESSION FOR THE PREVENTION  OF TAXANE-INDUCED PERIPHERAL NEUROPATHY  Called and [facility] to follow-up on interest in above-listed studies. Provided contact information for Donell Sievert, RN, for coverage when I am out of office this afternoon. Informed patient that, if she's interested in participating, we would need to bring her in for a research appointment tomorrow, 11/7, so that she is able to complete study requirements prior to first chemotherapy appointment on Thursday, 11/9. Will follow-up with Liza when I return tomorrow morning to see if patient reached out and to accomplish necessary tasks for consent and study enrollment, if needed.   Vickii Penna, RN, BSN, CPN Clinical Research Nurse I [phone]  05/15/2022 12:21 PM

## 2022-05-16 ENCOUNTER — Encounter (HOSPITAL_COMMUNITY): Payer: Self-pay | Admitting: Surgery

## 2022-05-16 ENCOUNTER — Other Ambulatory Visit: Payer: Self-pay | Admitting: *Deleted

## 2022-05-16 ENCOUNTER — Other Ambulatory Visit: Payer: Self-pay

## 2022-05-16 DIAGNOSIS — Z170 Estrogen receptor positive status [ER+]: Secondary | ICD-10-CM

## 2022-05-16 MED ORDER — ONDANSETRON HCL 8 MG PO TABS: 8.0000 mg | ORAL_TABLET | Freq: Three times a day (TID) | ORAL | 1 refills | Status: DC | PRN

## 2022-05-16 MED ORDER — LIDOCAINE-PRILOCAINE 2.5-2.5 % EX CREA: TOPICAL_CREAM | CUTANEOUS | 3 refills | Status: DC

## 2022-05-16 MED ORDER — PROCHLORPERAZINE MALEATE 10 MG PO TABS: 10.0000 mg | ORAL_TABLET | Freq: Four times a day (QID) | ORAL | 1 refills | Status: DC | PRN

## 2022-05-16 MED ORDER — DEXAMETHASONE 4 MG PO TABS: ORAL_TABLET | ORAL | 1 refills | Status: DC

## 2022-05-16 NOTE — Progress Notes (Addendum)
For Short Stay: [place] appointment date: N/A  Bowel Prep reminder: N/A   For Anesthesia: PCP - Percell Belt, DO  Cardiologist - N/A  Chest x-ray - N/A EKG - N/A Stress Test - N/A ECHO - N/A Cardiac Cath - N/A Pacemaker/ICD device last checked:N/A Pacemaker orders received: N/A Device Rep notified: N/A  Spinal Cord Stimulator: N/A  Sleep Study - N/A CPAP - N/A  Fasting Blood Sugar - N/A Checks Blood Sugar ___N/A__ times a day Date and result of last Hgb A1c-N/A  Last dose of GLP1 agonist-  N/A GLP1 instructions:  N/A  Last dose of SGLT-2 inhibitors-  N/A SGLT-2 instructions: N/A  Blood Thinner Instructions: N/A Aspirin Instructions: N/A Last Dose: N/A  Activity level: Unable to go up a flight of stairs without chest pain and/or shortness of breath    Anesthesia review: SOB with just talking has an ECHO 07/17/21 at 8 am here at [facility]  Patient denies shortness of breath, fever, cough and chest pain at PAT appointment   Patient verbalized understanding of instructions reviewed via telephone.

## 2022-05-16 NOTE — H&P (Signed)
REFERRING PHYSICIAN:  Mel Almond*   PROVIDER:  Beverlee Nims, MD   MRN: [id number] DOB: [birth year] DATE OF ENCOUNTER: 05/10/2022 Subjective  Chief Complaint: Breast Cancer     History of Present Illness: [name] [name] is a 46 y.o. female who is seen today as an office consultation for evaluation of Breast Cancer  .     This is a 46 year old female who presents with a palpable left breast mass and some slight nipple discharge.  She felt it several months ago.  She ended up undergoing mammograms and ultrasound showing a 3.9 x 2.4 x 2.3 cm mass in the lower outer quadrant of the left breast at 3:00 5 cm from the nipple.  Ultrasound the axilla was negative.  She has a strong family history of multiple malignancies in the family including ovarian cancer and breast cancer.  She underwent a biopsy of the mass showing an invasive ductal carcinoma which was grade 2 and also showed DCIS.  The invasive cancer was 95% ER positive, 100% PR positive, HER2 positive, and had a Ki-67 of 50%.  She has had no previous problems regarding her breast and is otherwise healthy without complaints.  She has no cardiopulmonary issues.    Review of Systems: A complete review of systems was obtained from the patient.  I have reviewed this information and discussed as appropriate with the patient.  See HPI as well for other ROS.   ROS    Medical History: Past Medical History      Past Medical History:  Diagnosis Date   GERD (gastroesophageal reflux disease)          There is no problem list on file for this patient.     Past Surgical History       Past Surgical History:  Procedure Laterality Date   TONSILLECTOMY & ADENOIDECTOMY   1991   LAPAROSCOPIC TUBAL LIGATION   2005   nasal septoplasty   03/18/2018   cystoscopy with retrograde pyelogram/ uteroscopy with stent placement   02/12/2021   CYSTOSCOPY W/ URETEROSCOPY W/ LITHOTRIPSY   03/10/2021        Allergies        Allergies  Allergen Reactions   Amoxicillin Shortness Of Breath   Penicillins Shortness Of Breath   Duloxetine Other (See Comments)      Suicidal thoughts   Hydrocodone Itching        No current outpatient medications on file prior to visit.    No current facility-administered medications on file prior to visit.      Family History       Family History  Problem Relation Age of Onset   High blood pressure (Hypertension) Mother     Hyperlipidemia (Elevated cholesterol) Mother     Coronary Artery Disease (Blocked arteries around heart) Mother     Diabetes Mother     Deep vein thrombosis (DVT or abnormal blood clot formation) Maternal Grandmother     Ovarian cancer Maternal Grandmother          Social History       Tobacco Use  Smoking Status Never  Smokeless Tobacco Never      Social History  Social History        Socioeconomic History   Marital status: Single  Tobacco Use   Smoking status: Never   Smokeless tobacco: Never  Substance and Sexual Activity   Alcohol use: Not Currently   Drug use: Never  Objective:  There were no vitals filed for this visit.  There is no height or weight on file to calculate BMI.   Physical Exam    She appears well on exam   There is a palpable breast mass in the lower outer quadrant of the left breast measuring approximately 3 cm.  It is mobile.  There is no axillary adenopathy.  The nipple areolar complex is normal.  There are no other palpable breast masses.   Labs, Imaging and Diagnostic Testing: I reviewed her mammograms, ultrasound, and pathology results.   Assessment and Plan:     Diagnoses and all orders for this visit:   Invasive ductal carcinoma of breast, left (CMS-HCC)       We have discussed her diagnosis in our multidisciplinary breast cancer conference this morning.  I have also discussed the findings with the patient and her family.  Given the size of the tumor and the HER2 positive status, we  have recommended neoadjuvant chemotherapy in order to shrink the tumor so we can do breast conservation.  We discussed breast conservation versus mastectomy and she is interested in saving her breast.  We will get a pretreatment MRI of both her breasts to assess response to treatment.  For her chemotherapy, she will need a Port-A-Cath insertion.  I discussed this with her in detail.  We discussed port insertion.  We discussed the risks which includes but is not limited to bleeding, infection, injury to surrounding structures, pneumothorax, cardiopulmonary issues, port malfunction, blood clots, etc.  Port-A-Cath insertion will be scheduled as soon as possible along with MRI.  She and her family agree with the plans for neoadjuvant therapy.

## 2022-05-17 ENCOUNTER — Encounter (HOSPITAL_COMMUNITY): Payer: Self-pay | Admitting: Surgery

## 2022-05-17 ENCOUNTER — Ambulatory Visit (HOSPITAL_COMMUNITY): Payer: 59

## 2022-05-17 ENCOUNTER — Ambulatory Visit (HOSPITAL_BASED_OUTPATIENT_CLINIC_OR_DEPARTMENT_OTHER): Payer: 59 | Admitting: Physician Assistant

## 2022-05-17 ENCOUNTER — Other Ambulatory Visit: Payer: Self-pay | Admitting: Hematology and Oncology

## 2022-05-17 ENCOUNTER — Inpatient Hospital Stay (HOSPITAL_BASED_OUTPATIENT_CLINIC_OR_DEPARTMENT_OTHER): Payer: 59 | Admitting: Hematology and Oncology

## 2022-05-17 ENCOUNTER — Ambulatory Visit (HOSPITAL_BASED_OUTPATIENT_CLINIC_OR_DEPARTMENT_OTHER)
Admission: RE | Admit: 2022-05-17 | Discharge: 2022-05-17 | Disposition: A | Discharge status: Home / Self Care | Payer: 59 | Source: Ambulatory Visit | Attending: Hematology and Oncology | Admitting: Hematology and Oncology

## 2022-05-17 ENCOUNTER — Ambulatory Visit (HOSPITAL_COMMUNITY): Payer: 59 | Admitting: Physician Assistant

## 2022-05-17 ENCOUNTER — Inpatient Hospital Stay: Payer: 59

## 2022-05-17 ENCOUNTER — Other Ambulatory Visit: Payer: Self-pay | Admitting: *Deleted

## 2022-05-17 ENCOUNTER — Ambulatory Visit (HOSPITAL_COMMUNITY)
Admission: RE | Admit: 2022-05-17 | Discharge: 2022-05-17 | Disposition: A | Discharge status: Home / Self Care | Payer: 59 | Source: Ambulatory Visit | Attending: Surgery | Admitting: Surgery

## 2022-05-17 ENCOUNTER — Other Ambulatory Visit: Payer: Self-pay

## 2022-05-17 ENCOUNTER — Encounter (HOSPITAL_COMMUNITY)
Admission: RE | Disposition: A | Discharge status: Home / Self Care | Payer: Self-pay | Source: Ambulatory Visit | Attending: Surgery

## 2022-05-17 DIAGNOSIS — I5030 Unspecified diastolic (congestive) heart failure: Secondary | ICD-10-CM

## 2022-05-17 DIAGNOSIS — I361 Nonrheumatic tricuspid (valve) insufficiency: Secondary | ICD-10-CM | POA: Diagnosis not present

## 2022-05-17 DIAGNOSIS — C50412 Malignant neoplasm of upper-outer quadrant of left female breast: Secondary | ICD-10-CM | POA: Diagnosis not present

## 2022-05-17 DIAGNOSIS — Z170 Estrogen receptor positive status [ER+]: Secondary | ICD-10-CM

## 2022-05-17 DIAGNOSIS — C50912 Malignant neoplasm of unspecified site of left female breast: Secondary | ICD-10-CM

## 2022-05-17 DIAGNOSIS — Z0189 Encounter for other specified special examinations: Secondary | ICD-10-CM | POA: Diagnosis not present

## 2022-05-17 DIAGNOSIS — N200 Calculus of kidney: Secondary | ICD-10-CM | POA: Insufficient documentation

## 2022-05-17 DIAGNOSIS — R0602 Shortness of breath: Secondary | ICD-10-CM

## 2022-05-17 DIAGNOSIS — D630 Anemia in neoplastic disease: Secondary | ICD-10-CM | POA: Diagnosis not present

## 2022-05-17 DIAGNOSIS — Z01818 Encounter for other preprocedural examination: Secondary | ICD-10-CM

## 2022-05-17 DIAGNOSIS — C50512 Malignant neoplasm of lower-outer quadrant of left female breast: Secondary | ICD-10-CM | POA: Insufficient documentation

## 2022-05-17 HISTORY — DX: Malignant neoplasm of unspecified site of left female breast: C50.912

## 2022-05-17 HISTORY — DX: Dizziness and giddiness: R42

## 2022-05-17 HISTORY — PX: PORTACATH PLACEMENT: SHX2246

## 2022-05-17 HISTORY — DX: Tinnitus, unspecified ear: H93.19

## 2022-05-17 HISTORY — DX: Dyspnea, unspecified: R06.00

## 2022-05-17 HISTORY — DX: Fibromyalgia: M79.7

## 2022-05-17 LAB — POCT PREGNANCY, URINE: Preg Test, Ur: NEGATIVE

## 2022-05-17 LAB — CMP (CANCER CENTER ONLY)
ALT: 9 U/L (ref 0–44)
AST: 10 U/L — ABNORMAL LOW (ref 15–41)
Albumin: 4.1 g/dL (ref 3.5–5.0)
Alkaline Phosphatase: 52 U/L (ref 38–126)
Anion gap: 5 (ref 5–15)
BUN: 8 mg/dL (ref 6–20)
CO2: 30 mmol/L (ref 22–32)
Calcium: 9.2 mg/dL (ref 8.9–10.3)
Chloride: 105 mmol/L (ref 98–111)
Creatinine: 0.82 mg/dL (ref 0.44–1.00)
GFR, Estimated: >60 mL/min (ref >60)
Glucose, Bld: 98 mg/dL (ref 70–99)
Potassium: 3.5 mmol/L (ref 3.5–5.1)
Sodium: 140 mmol/L (ref 135–145)
Total Bilirubin: 0.8 mg/dL (ref 0.3–1.2)
Total Protein: 6.7 g/dL (ref 6.5–8.1)

## 2022-05-17 LAB — CBC WITH DIFFERENTIAL (CANCER CENTER ONLY)
Abs Immature Granulocytes: 0 10*3/uL (ref 0.00–0.07)
Basophils Absolute: 0 10*3/uL (ref 0.0–0.1)
Basophils Relative: 1 %
Eosinophils Absolute: 0.1 10*3/uL (ref 0.0–0.5)
Eosinophils Relative: 2 %
HCT: 37.9 % (ref 36.0–46.0)
Hemoglobin: 13.4 g/dL (ref 12.0–15.0)
Immature Granulocytes: 0 %
Lymphocytes Relative: 32 %
Lymphs Abs: 1.3 10*3/uL (ref 0.7–4.0)
MCH: 30.9 pg (ref 26.0–34.0)
MCHC: 35.4 g/dL (ref 30.0–36.0)
MCV: 87.5 fL (ref 80.0–100.0)
Monocytes Absolute: 0.3 10*3/uL (ref 0.1–1.0)
Monocytes Relative: 6 %
Neutro Abs: 2.4 10*3/uL (ref 1.7–7.7)
Neutrophils Relative %: 59 %
Platelet Count: 161 10*3/uL (ref 150–400)
RBC: 4.33 MIL/uL (ref 3.87–5.11)
RDW: 12.6 % (ref 11.5–15.5)
WBC Count: 4 10*3/uL (ref 4.0–10.5)
nRBC: 0 % (ref 0.0–0.2)

## 2022-05-17 LAB — ECHOCARDIOGRAM COMPLETE
Area-P 1/2: 2.24 cm2
S' Lateral: 3.8 cm

## 2022-05-17 SURGERY — INSERTION, TUNNELED CENTRAL VENOUS DEVICE, WITH PORT
Anesthesia: General | Laterality: Right

## 2022-05-17 MED ORDER — FENTANYL CITRATE (PF) 100 MCG/2ML IJ SOLN
INTRAMUSCULAR | Status: DC | PRN
  Administered 2022-05-17 (×2): 50 ug via INTRAVENOUS

## 2022-05-17 MED ORDER — FENTANYL CITRATE (PF) 100 MCG/2ML IJ SOLN
INTRAMUSCULAR | Status: AC
  Filled 2022-05-17: qty 2

## 2022-05-17 MED ORDER — ONDANSETRON HCL 4 MG/2ML IJ SOLN
INTRAMUSCULAR | Status: DC | PRN
  Administered 2022-05-17: 4 mg via INTRAVENOUS

## 2022-05-17 MED ORDER — BUPIVACAINE HCL (PF) 0.5 % IJ SOLN
INTRAMUSCULAR | Status: AC
  Filled 2022-05-17: qty 30

## 2022-05-17 MED ORDER — LIDOCAINE HCL (PF) 2 % IJ SOLN
INTRAMUSCULAR | Status: AC
  Filled 2022-05-17: qty 5

## 2022-05-17 MED ORDER — MIDAZOLAM HCL 2 MG/2ML IJ SOLN
INTRAMUSCULAR | Status: DC | PRN
  Administered 2022-05-17: 2 mg via INTRAVENOUS

## 2022-05-17 MED ORDER — ACETAMINOPHEN 500 MG PO TABS
1000.0000 mg | ORAL_TABLET | ORAL | Status: AC
  Administered 2022-05-17: 1000 mg via ORAL
  Filled 2022-05-17: qty 2

## 2022-05-17 MED ORDER — DEXAMETHASONE SODIUM PHOSPHATE 10 MG/ML IJ SOLN
INTRAMUSCULAR | Status: DC | PRN
  Administered 2022-05-17: 10 mg via INTRAVENOUS

## 2022-05-17 MED ORDER — TRAMADOL HCL 50 MG PO TABS: 50.0000 mg | ORAL_TABLET | Freq: Four times a day (QID) | ORAL | 0 refills | Status: DC | PRN

## 2022-05-17 MED ORDER — BUPIVACAINE HCL (PF) 0.5 % IJ SOLN
INTRAMUSCULAR | Status: DC | PRN
  Administered 2022-05-17: 10 mL

## 2022-05-17 MED ORDER — LACTATED RINGERS IV SOLN: INTRAVENOUS | Status: DC

## 2022-05-17 MED ORDER — PROPOFOL 10 MG/ML IV BOLUS
INTRAVENOUS | Status: AC
  Filled 2022-05-17: qty 20

## 2022-05-17 MED ORDER — ORAL CARE MOUTH RINSE: 15.0000 mL | Freq: Once | OROMUCOSAL | Status: AC

## 2022-05-17 MED ORDER — DEXAMETHASONE SODIUM PHOSPHATE 10 MG/ML IJ SOLN
INTRAMUSCULAR | Status: AC
  Filled 2022-05-17: qty 1

## 2022-05-17 MED ORDER — CHLORHEXIDINE GLUCONATE CLOTH 2 % EX PADS: 6.0000 | MEDICATED_PAD | Freq: Once | CUTANEOUS | Status: DC

## 2022-05-17 MED ORDER — HEPARIN 6000 UNIT IRRIGATION SOLUTION
Freq: Once | Status: AC
  Administered 2022-05-17: 1
  Filled 2022-05-17: qty 6000

## 2022-05-17 MED ORDER — CHLORHEXIDINE GLUCONATE 0.12 % MT SOLN
15.0000 mL | Freq: Once | OROMUCOSAL | Status: AC
  Administered 2022-05-17: 15 mL via OROMUCOSAL

## 2022-05-17 MED ORDER — AMISULPRIDE (ANTIEMETIC) 5 MG/2ML IV SOLN: 10.0000 mg | Freq: Once | INTRAVENOUS | Status: DC | PRN

## 2022-05-17 MED ORDER — SUCCINYLCHOLINE CHLORIDE 200 MG/10ML IV SOSY
PREFILLED_SYRINGE | INTRAVENOUS | Status: AC
  Filled 2022-05-17: qty 10

## 2022-05-17 MED ORDER — HEPARIN SOD (PORK) LOCK FLUSH 100 UNIT/ML IV SOLN
INTRAVENOUS | Status: DC | PRN
  Administered 2022-05-17: 500 [IU]

## 2022-05-17 MED ORDER — SUCCINYLCHOLINE CHLORIDE 200 MG/10ML IV SOSY
PREFILLED_SYRINGE | INTRAVENOUS | Status: DC | PRN
  Administered 2022-05-17: 60 mg via INTRAVENOUS

## 2022-05-17 MED ORDER — MIDAZOLAM HCL 2 MG/2ML IJ SOLN
INTRAMUSCULAR | Status: AC
  Filled 2022-05-17: qty 2

## 2022-05-17 MED ORDER — MEPERIDINE HCL 50 MG/ML IJ SOLN: 6.2500 mg | INTRAMUSCULAR | Status: DC | PRN

## 2022-05-17 MED ORDER — ENSURE PRE-SURGERY PO LIQD: 296.0000 mL | Freq: Once | ORAL | Status: DC

## 2022-05-17 MED ORDER — ONDANSETRON HCL 4 MG/2ML IJ SOLN
INTRAMUSCULAR | Status: AC
  Filled 2022-05-17: qty 2

## 2022-05-17 MED ORDER — CIPROFLOXACIN IN D5W 400 MG/200ML IV SOLN
400.0000 mg | INTRAVENOUS | Status: AC
  Administered 2022-05-17: 400 mg via INTRAVENOUS
  Filled 2022-05-17: qty 200

## 2022-05-17 MED ORDER — FENTANYL CITRATE PF 50 MCG/ML IJ SOSY: 25.0000 ug | PREFILLED_SYRINGE | INTRAMUSCULAR | Status: DC | PRN

## 2022-05-17 MED ORDER — PROMETHAZINE HCL 25 MG/ML IJ SOLN: 6.2500 mg | INTRAMUSCULAR | Status: DC | PRN

## 2022-05-17 MED ORDER — LIDOCAINE 2% (20 MG/ML) 5 ML SYRINGE
INTRAMUSCULAR | Status: DC | PRN
  Administered 2022-05-17: 80 mg via INTRAVENOUS

## 2022-05-17 MED ORDER — OXYCODONE HCL 5 MG PO TABS
ORAL_TABLET | ORAL | Status: AC
  Filled 2022-05-17: qty 1

## 2022-05-17 MED ORDER — OXYCODONE HCL 5 MG PO TABS
5.0000 mg | ORAL_TABLET | Freq: Once | ORAL | Status: AC | PRN
  Administered 2022-05-17: 5 mg via ORAL

## 2022-05-17 MED ORDER — PROPOFOL 10 MG/ML IV BOLUS
INTRAVENOUS | Status: DC | PRN
  Administered 2022-05-17: 160 mg via INTRAVENOUS

## 2022-05-17 MED ORDER — HEPARIN SOD (PORK) LOCK FLUSH 100 UNIT/ML IV SOLN
INTRAVENOUS | Status: AC
  Filled 2022-05-17: qty 5

## 2022-05-17 MED ORDER — OXYCODONE HCL 5 MG/5ML PO SOLN: 5.0000 mg | Freq: Once | ORAL | Status: AC | PRN

## 2022-05-17 MED FILL — Dexamethasone Sodium Phosphate Inj 100 MG/10ML: INTRAMUSCULAR | Qty: 1 | Status: AC

## 2022-05-17 MED FILL — Fosaprepitant Dimeglumine For IV Infusion 150 MG (Base Eq): INTRAVENOUS | Qty: 5 | Status: AC

## 2022-05-17 SURGICAL SUPPLY — 42 items
ADH SKN CLS APL DERMABOND .7 (GAUZE/BANDAGES/DRESSINGS) ×1
APL SKNCLS STERI-STRIP NONHPOA (GAUZE/BANDAGES/DRESSINGS)
BAG COUNTER SPONGE SURGICOUNT (BAG) IMPLANT
BAG DECANTER FOR FLEXI CONT (MISCELLANEOUS) ×1 IMPLANT
BAG SPNG CNTER NS LX DISP (BAG)
BENZOIN TINCTURE PRP APPL 2/3 (GAUZE/BANDAGES/DRESSINGS) IMPLANT
BLADE HEX COATED 2.75 (ELECTRODE) ×1 IMPLANT
BLADE SURG 15 STRL LF DISP TIS (BLADE) ×1 IMPLANT
BLADE SURG 15 STRL SS (BLADE) ×1
BLADE SURG SZ11 CARB STEEL (BLADE) ×1 IMPLANT
DERMABOND ADVANCED .7 DNX12 (GAUZE/BANDAGES/DRESSINGS) IMPLANT
DRAPE C-ARM 42X120 X-RAY (DRAPES) ×1 IMPLANT
DRAPE LAPAROSCOPIC ABDOMINAL (DRAPES) ×1 IMPLANT
DRSG TEGADERM 2-3/8X2-3/4 SM (GAUZE/BANDAGES/DRESSINGS) IMPLANT
DRSG TEGADERM 4X4.75 (GAUZE/BANDAGES/DRESSINGS) IMPLANT
ELECT REM PT RETURN 15FT ADLT (MISCELLANEOUS) ×1 IMPLANT
GAUZE 4X4 16PLY ~~LOC~~+RFID DBL (SPONGE) ×1 IMPLANT
GAUZE SPONGE 4X4 12PLY STRL (GAUZE/BANDAGES/DRESSINGS) IMPLANT
GLOVE BIO SURGEON STRL SZ7 (GLOVE) ×3 IMPLANT
GLOVE BIO SURGEON STRL SZ7.5 (GLOVE) ×1 IMPLANT
GLOVE BIOGEL PI IND STRL 7.0 (GLOVE) ×1 IMPLANT
GOWN STRL REUS W/ TWL XL LVL3 (GOWN DISPOSABLE) ×2 IMPLANT
GOWN STRL REUS W/TWL XL LVL3 (GOWN DISPOSABLE) ×2
KIT BASIN OR (CUSTOM PROCEDURE TRAY) ×1 IMPLANT
KIT PORT POWER 8FR ISP CVUE (Port) IMPLANT
KIT TURNOVER KIT A (KITS) IMPLANT
NDL HYPO 25X1 1.5 SAFETY (NEEDLE) ×1 IMPLANT
NEEDLE HYPO 25X1 1.5 SAFETY (NEEDLE) ×1 IMPLANT
NS IRRIG 1000ML POUR BTL (IV SOLUTION) ×1 IMPLANT
PACK BASIC VI WITH GOWN DISP (CUSTOM PROCEDURE TRAY) ×1 IMPLANT
PENCIL SMOKE EVACUATOR (MISCELLANEOUS) IMPLANT
SPIKE FLUID TRANSFER (MISCELLANEOUS) ×1 IMPLANT
STRIP CLOSURE SKIN 1/2X4 (GAUZE/BANDAGES/DRESSINGS) IMPLANT
SUT MNCRL AB 4-0 PS2 18 (SUTURE) ×1 IMPLANT
SUT PROLENE 2 0 SH DA (SUTURE) ×1 IMPLANT
SUT SILK 2 0 (SUTURE)
SUT SILK 2-0 30XBRD TIE 12 (SUTURE) IMPLANT
SUT VIC AB 3-0 SH 27 (SUTURE) ×1
SUT VIC AB 3-0 SH 27XBRD (SUTURE) ×1 IMPLANT
SYR 10ML LL (SYRINGE) ×1 IMPLANT
SYR CONTROL 10ML LL (SYRINGE) ×1 IMPLANT
TOWEL OR 17X26 10 PK STRL BLUE (TOWEL DISPOSABLE) ×1 IMPLANT

## 2022-05-17 NOTE — Interval H&P Note (Signed)
History and Physical Interval Note: no change in H and P  05/17/2022 12:24 PM  [name] [name]  has presented today for surgery, with the diagnosis of BREAST CANCER, NEED FOR CHEMOTHERAPY.  The various methods of treatment have been discussed with the patient and family. After consideration of risks, benefits and other options for treatment, the patient has consented to  Procedure(s): PORT-A-CATH INSERTION WITH ULTRASOUND GUIDANCE (N/A) as a surgical intervention.  The patient's history has been reviewed, patient examined, no change in status, stable for surgery.  I have reviewed the patient's chart and labs.  Questions were answered to the patient's satisfaction.     [name]

## 2022-05-17 NOTE — Transfer of Care (Signed)
Immediate Anesthesia Transfer of Care Note  Patient: Silvestre Gunner  Procedure(s) Performed: PORT-A-CATH INSERTION WITH ULTRASOUND GUIDANCE (Right)  Patient Location: PACU  Anesthesia Type:General  Level of Consciousness: drowsy  Airway & Oxygen Therapy: Patient Spontanous Breathing and Patient connected to face mask oxygen  Post-op Assessment: Report given to RN and Post -op Vital signs reviewed and stable  Post vital signs: Reviewed and stable  Last Vitals:  Vitals Value Taken Time  BP 137/99 05/17/22 1442  Temp    Pulse 93 05/17/22 1444  Resp    SpO2 100 % 05/17/22 1444  Vitals shown include unvalidated device data.  Last Pain:  Vitals:   05/17/22 1231  TempSrc:   PainSc: 0-No pain         Complications: No notable events documented.

## 2022-05-17 NOTE — Anesthesia Preprocedure Evaluation (Signed)
Anesthesia Evaluation  Patient identified by MRN, date of birth, ID band Patient awake    Reviewed: Allergy & Precautions, NPO status , Patient's Chart, lab work & pertinent test results  Airway Mallampati: II  TM Distance: >3 FB Neck ROM: Full    Dental  (+) Chipped, Dental Advisory Given,    Pulmonary shortness of breath   Pulmonary exam normal breath sounds clear to auscultation       Cardiovascular negative cardio ROS Normal cardiovascular exam Rhythm:Regular Rate:Normal     Neuro/Psych  Headaches  negative psych ROS   GI/Hepatic Neg liver ROS,GERD  Medicated and Controlled,,  Endo/Other  negative endocrine ROS    Renal/GU negative Renal ROS     Musculoskeletal  (+)  Fibromyalgia -  Abdominal  (+) + obese  Peds  Hematology  (+) Blood dyscrasia, anemia   Anesthesia Other Findings LEFT URETERAL STONE  Reproductive/Obstetrics hcg negative                              Anesthesia Physical Anesthesia Plan  ASA: 2  Anesthesia Plan: General   Post-op Pain Management: Tylenol PO (pre-op)* and Minimal or no pain anticipated   Induction: Intravenous and Rapid sequence  PONV Risk Score and Plan: 4 or greater and Ondansetron, Dexamethasone, Midazolam and Treatment may vary due to age or medical condition  Airway Management Planned: Oral ETT  Additional Equipment:   Intra-op Plan:   Post-operative Plan: Extubation in OR  Informed Consent: I have reviewed the patients History and Physical, chart, labs and discussed the procedure including the risks, benefits and alternatives for the proposed anesthesia with the patient or authorized representative who has indicated his/her understanding and acceptance.     Dental advisory given  Plan Discussed with: CRNA  Anesthesia Plan Comments:          Anesthesia Quick Evaluation

## 2022-05-17 NOTE — Anesthesia Procedure Notes (Signed)
Procedure Name: Intubation Date/Time: 05/17/2022 1:41 PM  Performed by: Sharlette Dense, CRNAPre-anesthesia Checklist: Patient identified, Emergency Drugs available, Suction available and Patient being monitored Patient Re-evaluated:Patient Re-evaluated prior to induction Oxygen Delivery Method: Circle system utilized Preoxygenation: Pre-oxygenation with 100% oxygen Induction Type: IV induction, Rapid sequence and Cricoid Pressure applied Laryngoscope Size: Miller and 2 Grade View: Grade I Tube type: Oral Tube size: 7.5 mm Number of attempts: 1 Airway Equipment and Method: Stylet and Oral airway Placement Confirmation: ETT inserted through vocal cords under direct vision, positive ETCO2 and breath sounds checked- equal and bilateral Secured at: 22 cm Tube secured with: Tape Dental Injury: Teeth and Oropharynx as per pre-operative assessment

## 2022-05-17 NOTE — Anesthesia Postprocedure Evaluation (Signed)
Anesthesia Post Note  Patient: [name] [name]  Procedure(s) Performed: PORT-A-CATH INSERTION WITH ULTRASOUND GUIDANCE (Right)     Patient location during evaluation: PACU Anesthesia Type: General Level of consciousness: sedated and patient cooperative Pain management: pain level controlled Vital Signs Assessment: post-procedure vital signs reviewed and stable Respiratory status: spontaneous breathing Cardiovascular status: stable Anesthetic complications: no   No notable events documented.  Last Vitals:  Vitals:   05/17/22 1515 05/17/22 1520  BP: 125/86 (!) 135/90  Pulse: 80 78  Resp: 13 15  Temp:    SpO2: 99% 100%    Last Pain:  Vitals:   05/17/22 1520  TempSrc:   PainSc: 0-No pain                 Nolon Nations

## 2022-05-17 NOTE — Op Note (Signed)
PORT-A-CATH INSERTION WITH ULTRASOUND GUIDANCE  Procedure Note  [name] [name] 05/17/2022   Pre-op Diagnosis: BREAST CANCER, NEED FOR CHEMOTHERAPY     Post-op Diagnosis: same  Procedure(s): PORT-A-CATH INSERTION WITH ULTRASOUND GUIDANCE (24F, Right internal Jugular vein  Surgeon(s): [name], MD  Anesthesia: General  Staff:  Circulator: Lurena Joiner, RN Radiology Technologist: Desma Mcgregor, RT Scrub Person: Verda Cumins  Estimated Blood Loss: Minimal  Indications: This is a 46 year old female recently diagnosed with left breast cancer which is HER2 positive.  The decision was made to proceed with neoadjuvant chemotherapy.  Port-A-Cath insertion is recommended  Procedure: The patient was brought to operating identifies correct patient.  She was placed upon the operating room table and general anesthesia was induced.  Her right neck and chest were then prepped and draped in usual sterile fashion.  The patient was placed in the Trendelenburg position.  With the ultrasound, I identified the right internal jugular vein.  I then accessed the vein with the introducer needle.  I then passed a wire through the needle and is a central venous system.  This was confirmed under fluoroscopy.  An 8 [place] catheter brought to the field.  It was flushed appropriately.  I made an incision on the patient's right chest with a scalpel.  I then dissected down to near the chest wall with the cautery and created a pocket for the port.  I made an incision with a 11 blade on the neck at the wire site.  I then attached the catheter to the port.  I placed the port in the pocket.  I then used the tunneler to tunnel the catheter from the pocket, over the clavicle, and out of the incision on the neck.  I passed the dilator and introducer sheath over the wire and into the central venous system.  The wire and dilator were removed.  The Port-A-Cath was cutting appropriate length and  then fed down the peel-away sheath into the central venous system.  Fluoroscopy confirmed placement in the superior vena cava.  I accessed the port and good flush and return were demonstrated.  I sutured the port to the chest wall with a 2-0 Prolene suture.  I then closed the subcutaneous tissue of both incisions with interrupted 3-0 Vicryl sutures and closed skin with 4-0 Monocryl sutures.  Dermabond was then applied.  I accessed the port and instilled concentrated heparin solution to the port.  The port was left accessed as the patient will be starting chemotherapy tomorrow.  The patient tolerated the procedure well.  All the counts were correct at the end of the procedure.  The patient was then extubated in the operating room and taken in a stable condition to the recovery room.          [name]   Date: 05/17/2022  Time: 2:24 PM

## 2022-05-17 NOTE — Discharge Instructions (Signed)
You may shower tomorrow after you have completed chemotherapy and they have deaccessed her Port-A-Cath  No vigorous activity for 1 week  You may use Tylenol, ibuprofen, and an ice pack also for discomfort

## 2022-05-17 NOTE — Progress Notes (Signed)
  Echocardiogram 2D Echocardiogram has been performed.  [name] M 05/17/2022, 8:44 AM

## 2022-05-17 NOTE — Assessment & Plan Note (Signed)
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  Given large tumor measuring almost 4 cm in longest dimension and HER2 amplification, we have discussed about neoadjuvant chemotherapy.  I have recommended neoadjuvant TCHP followed by surgery and adjuvant HER2 based therapy depending on response.  Once she completes adjuvant radiation, she will be a candidate for adjuvant antiestrogen therapy.   We have discussed adverse effects of chemotherapy including but not limited to fatigue, nausea, vomiting, diarrhea, increased risk of infections, neurotoxicity, cardiotoxicity.  She understands that the chemotherapy is every 21 days for 6 cycles followed by growth factor support with each cycle.  She understands that the growth factors cancer times cause diffuse bone pains.  Post chemotherapy, she will repeat imaging again and proceed with lumpectomy depending on the response.  Following surgery, she will proceed with adjuvant radiation again and antiestrogen therapy.   Since last visit, she denies any new complaints, no change in mass. PE no significant change. She was called about the need for additional biopsies if breast conserving surgery is desired. Per my discussion with Dr Brandy Hale to proceed with chemo as scheduled.  Port placement today. We once again discussed about the side effects. She will call us with any intractable side effects RTC in one week for tox check  Benay Pike MD

## 2022-05-17 NOTE — Progress Notes (Unsigned)
[facility] CONSULT NOTE  Patient Care Team: Percell Belt, DO as PCP - General (Family Medicine) [name], MD as Consulting Physician (General Surgery) Benay Pike, MD as Consulting Physician (Hematology and Oncology) Kyung Rudd, MD as Consulting Physician (Radiation Oncology) Rockwell Germany, RN as Oncology Nurse Navigator Mauro Kaufmann, RN as Oncology Nurse Navigator  CHIEF COMPLAINTS/PURPOSE OF CONSULTATION:   Newly diagnosed breast cancer before planned C1 TCHP  HISTORY OF PRESENTING ILLNESS:  [name] [name] 46 y.o. female is here because of recent diagnosis of left breast cancer  I reviewed her records extensively and collaborated the history with the patient.  SUMMARY OF ONCOLOGIC HISTORY: Oncology History  Malignant neoplasm of upper-outer quadrant of left breast in female, estrogen receptor positive ([place])  04/25/2022 Imaging   Patient had diagnostic bilateral mammogram given palpable mass in the left breast.  This showed a 2.6 x 3.5 x 3 cm irregular mass as well as possible satellite mass which is indeterminate.  An ultrasound was recommended.  Ultrasound of the left breast mass showed 3.9 x 2.4 x 2.3 cm irregular mass with an irregular and spiculated margin at 3:00 middle depth 5 cm from the nipple, irregular mass is hypoechoic.  In the left breast at 1 o'clock position, 3 cm from the nipple there is a round mass measuring 0.3 x 0.2 x 0.3 cm.  No significant abnormalities were seen sonographically in the left axilla.  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  Left breast needle core biopsy at 1:00 3 cm from the nipple showed fibrocystic changes with usual ductal hyperplasia negative for malignancy.   Prognostic showed ER 95% PR 40%, HER2 3+ KI of 50%   05/08/2022 Initial Diagnosis   Malignant neoplasm of upper-outer quadrant of left breast in female, estrogen receptor positive ([place])   05/08/2022 Cancer Staging   Staging form: Breast, AJCC 8th Edition - Clinical stage from 05/08/2022: Stage IB (cT2, cN0, cM0, G2, ER+, PR+, HER2+) - Signed by Hayden Pedro, PA-C on 05/08/2022 Stage prefix: Initial diagnosis Method of lymph node assessment: Clinical Histologic grading system: 3 grade system   05/18/2022 -  Chemotherapy   Patient is on Treatment Plan : BREAST  Docetaxel + Carboplatin + Trastuzumab + Pertuzumab  (TCHP) q21d       She is here before planned first cycle of TCHP Since last visit, she is now getting port placed. She hasn't noticed much change in her breast mass since last visit. She is very anxious about getting started tomorrow. It looks like she will need additional biopsies if breast conserving surgery is anticipated. Rest of the pertinent 10 point ROS reviewed and negative  MEDICAL HISTORY:  Past Medical History:  Diagnosis Date   Anemia    history of   Breast cancer, left ([place])    Dyspnea    even with speaking   Family history of adverse reaction to anesthesia    mother slow to awaken   Family history of breast cancer 05/10/2022   Family history of ovarian cancer 05/10/2022   Fibromyalgia    GERD (gastroesophageal reflux disease)    History of kidney stones    last stone 6 months ago    Migraine    Seasonal allergies  Tinnitus    Vertigo     SURGICAL HISTORY: Past Surgical History:  Procedure Laterality Date   ADENOIDECTOMY  1991   CYSTOSCOPY WITH RETROGRADE PYELOGRAM, URETEROSCOPY AND STENT PLACEMENT Left 02/22/2021   Procedure: CYSTOSCOPY WITH RETROGRADE PYELOGRAM, URETEROSCOPY AND STENT PLACEMENT;  Surgeon: Remi Haggard, MD;  Location: [facility];  Service: Urology;  Laterality: Left;   CYSTOSCOPY/URETEROSCOPY/HOLMIUM LASER/STENT  PLACEMENT Left 03/10/2021   Procedure: CYSTOSCOPY RETROGRADE, LEFT URETEROSCOPY/ STONE BASKETRY [place] LASER/STENT EXCHANGE;  Surgeon: Ardis Hughs, MD;  Location: [facility];  Service: Urology;  Laterality: Left;   ESOPHAGOGASTRODUODENOSCOPY N/A 11/17/2013   Procedure: ESOPHAGOGASTRODUODENOSCOPY (EGD);  Surgeon: Milus Banister, MD;  Location: [facility];  Service: Endoscopy;  Laterality: N/A;   ESOPHAGOGASTRODUODENOSCOPY N/A 10/15/2015   Procedure: ESOPHAGOGASTRODUODENOSCOPY (EGD);  Surgeon: Gatha Mayer, MD;  [facility];  Service: Endoscopy;  Laterality: N/A;   HOLMIUM LASER APPLICATION  10/13/2701   Procedure: HOLMIUM LASER APPLICATION;  Surgeon: Ardis Hughs, MD;  [facility];  Service: Urology;;   NASAL SEPTOPLASTY W/ TURBINOPLASTY Bilateral 03/18/2018   Procedure: BILATERAL NASAL SEPTOPLASTY WITH TURBINATE REDUCTION;  Surgeon: Jodi Marble, MD;  Location: [facility];  Service: ENT;  Laterality: Bilateral;   TONSILLECTOMY  1991   TUBAL LIGATION  2005    SOCIAL HISTORY: Social History   Socioeconomic History   Marital status: Single    Spouse name: Not on file   Number of children: Not on file   Years of education: Not on file   Highest education level: Not on file  Occupational History   Not on file  Tobacco Use   Smoking status: Never   Smokeless tobacco: Never  Vaping Use   Vaping Use: Never used  Substance and Sexual Activity   Alcohol use: No   Drug use: No   Sexual activity: Not on file  Other Topics Concern   Not on file  Social History Narrative   ** Merged History Encounter **       Social Determinants of Health   Financial Resource Strain: Low Risk  (05/10/2022)   Overall Financial Resource Strain (CARDIA)    Difficulty of Paying Living Expenses: Not very hard  Food Insecurity: No Food Insecurity (05/10/2022)   Hunger Vital Sign    Worried About Running Out of Food in the  Last Year: Never true    Ran Out of Food in the Last Year: Never true  Transportation Needs: No Transportation Needs (05/10/2022)   PRAPARE - Hydrologist (Medical): No    Lack of Transportation (Non-Medical): No  Physical Activity: Not on file  Stress: Not on file  Social Connections: Not on file  Intimate Partner Violence: Not on file    FAMILY HISTORY: Family History  Problem Relation Age of Onset   Asthma Mother    Diabetes Mother    Colon polyps Mother    Ovarian cancer Maternal Grandmother 76   Diabetes Maternal Grandmother    Esophageal cancer Maternal Grandfather        d. 54   Breast cancer Other        MGM's sisters x2   Cervical cancer Cousin        mat cousin; dx <40    ALLERGIES:  is allergic to amoxicillin, coconut (cocos nucifera), penicillins, duloxetine, hydrocodone, hydrocodone-acetaminophen, and moxifloxacin.  MEDICATIONS:  Current Outpatient Medications  Medication Sig Dispense Refill   acetaminophen (TYLENOL) 500 MG tablet Take  1,000 mg by mouth every 8 (eight) hours as needed for headache.     dexamethasone (DECADRON) 4 MG tablet Take 2 tabs by mouth 2 times daily starting day before chemo. Then take 2 tabs daily for 2 days starting day after chemo. Take with food. 30 tablet 1   lidocaine-prilocaine (EMLA) cream Apply to affected area once 30 g 3   ondansetron (ZOFRAN) 8 MG tablet Take 1 tablet (8 mg total) by mouth every 8 (eight) hours as needed for nausea or vomiting. Start on the third day after chemotherapy. 30 tablet 1   prochlorperazine (COMPAZINE) 10 MG tablet Take 1 tablet (10 mg total) by mouth every 6 (six) hours as needed for nausea or vomiting. 30 tablet 1   No current facility-administered medications for this visit.    REVIEW OF SYSTEMS:   Constitutional: Denies fevers, chills or abnormal night sweats Eyes: Denies blurriness of vision, double vision or watery eyes Ears, nose, mouth, throat, and face: Denies  mucositis or sore throat Respiratory: Denies cough, dyspnea or wheezes Cardiovascular: Denies palpitation, chest discomfort or lower extremity swelling Gastrointestinal:  Denies nausea, heartburn or change in bowel habits Skin: Denies abnormal skin rashes Lymphatics: Denies new lymphadenopathy or easy bruising Neurological:Denies numbness, tingling or new weaknesses Behavioral/Psych: Mood is stable, no new changes  Breast:  Denies any palpable lumps or discharge All other systems were reviewed with the patient and are negative.  PHYSICAL EXAMINATION: ECOG PERFORMANCE STATUS: 0 - Asymptomatic  Vitals:   05/17/22 1053  BP: (!) 142/95  Pulse: 77  Resp: 16  Temp: 97.7 F (36.5 C)  SpO2: 100%   Filed Weights   05/17/22 1053  Weight: 188 lb 6.4 oz (85.5 kg)    GENERAL:alert, no distress and comfortable PSYCH: alert & oriented x 3 with fluent speech NEURO: no focal motor/sensory deficits BREAST:  Lower outer quadrant mass measuring approximately 3 to 4 cm in largest dimension.  No palpable regional adenopathy. No change since last visit.  LABORATORY DATA:  I have reviewed the data as listed Lab Results  Component Value Date   WBC 4.0 05/17/2022   HGB 13.4 05/17/2022   HCT 37.9 05/17/2022   MCV 87.5 05/17/2022   PLT 161 05/17/2022   Lab Results  Component Value Date   NA 140 05/10/2022   K 3.2 (L) 05/10/2022   CL 106 05/10/2022   CO2 29 05/10/2022    RADIOGRAPHIC STUDIES: I have personally reviewed the radiological reports and agreed with the findings in the report.  ASSESSMENT AND PLAN:  No problem-specific Assessment & Plan notes found for this encounter.   Total time spent: 60 minutes including history, physical exam, review of records, counseling and coordination of care All questions were answered. The patient knows to call the clinic with any problems, questions or concerns.    Benay Pike, MD 05/17/22

## 2022-05-17 NOTE — Progress Notes (Addendum)
Fulphila and Kanjinti substituted per insurance preferred biosimilar.  Raul Del [place], [place], BCPS, BCOP 05/17/2022 3:40 PM

## 2022-05-18 ENCOUNTER — Encounter: Payer: Self-pay | Admitting: Hematology and Oncology

## 2022-05-18 ENCOUNTER — Encounter: Payer: Self-pay | Admitting: *Deleted

## 2022-05-18 ENCOUNTER — Other Ambulatory Visit: Payer: Self-pay

## 2022-05-18 ENCOUNTER — Telehealth: Payer: Self-pay

## 2022-05-18 ENCOUNTER — Telehealth: Payer: Self-pay | Admitting: *Deleted

## 2022-05-18 ENCOUNTER — Inpatient Hospital Stay: Payer: 59

## 2022-05-18 VITALS — BP 119/76 | HR 78 | Temp 98.3°F | Resp 18 | Wt 189.5 lb

## 2022-05-18 DIAGNOSIS — Z170 Estrogen receptor positive status [ER+]: Secondary | ICD-10-CM

## 2022-05-18 DIAGNOSIS — Z5111 Encounter for antineoplastic chemotherapy: Secondary | ICD-10-CM | POA: Diagnosis not present

## 2022-05-18 MED ORDER — SODIUM CHLORIDE 0.9 % IV SOLN
75.0000 mg/m2 | Freq: Once | INTRAVENOUS | Status: AC
  Administered 2022-05-18: 150 mg via INTRAVENOUS
  Filled 2022-05-18: qty 15

## 2022-05-18 MED ORDER — SODIUM CHLORIDE 0.9 % IV SOLN
868.8000 mg | Freq: Once | INTRAVENOUS | Status: AC
  Administered 2022-05-18: 870 mg via INTRAVENOUS
  Filled 2022-05-18: qty 87

## 2022-05-18 MED ORDER — SODIUM CHLORIDE 0.9% FLUSH: 10.0000 mL | INTRAVENOUS | Status: DC | PRN

## 2022-05-18 MED ORDER — TRASTUZUMAB-ANNS CHEMO 150 MG IV SOLR
8.0000 mg/kg | Freq: Once | INTRAVENOUS | Status: AC
  Administered 2022-05-18: 693 mg via INTRAVENOUS
  Filled 2022-05-18: qty 33

## 2022-05-18 MED ORDER — SODIUM CHLORIDE 0.9 % IV SOLN
840.0000 mg | Freq: Once | INTRAVENOUS | Status: AC
  Administered 2022-05-18: 840 mg via INTRAVENOUS
  Filled 2022-05-18: qty 28

## 2022-05-18 MED ORDER — SODIUM CHLORIDE 0.9 % IV SOLN
150.0000 mg | Freq: Once | INTRAVENOUS | Status: AC
  Administered 2022-05-18: 150 mg via INTRAVENOUS
  Filled 2022-05-18: qty 150

## 2022-05-18 MED ORDER — HEPARIN SOD (PORK) LOCK FLUSH 100 UNIT/ML IV SOLN: 500.0000 [IU] | Freq: Once | INTRAVENOUS | Status: DC | PRN

## 2022-05-18 MED ORDER — ACETAMINOPHEN 325 MG PO TABS
650.0000 mg | ORAL_TABLET | Freq: Once | ORAL | Status: AC
  Administered 2022-05-18: 650 mg via ORAL
  Filled 2022-05-18: qty 2

## 2022-05-18 MED ORDER — DIPHENHYDRAMINE HCL 25 MG PO CAPS
50.0000 mg | ORAL_CAPSULE | Freq: Once | ORAL | Status: AC
  Administered 2022-05-18: 50 mg via ORAL
  Filled 2022-05-18: qty 2

## 2022-05-18 MED ORDER — PALONOSETRON HCL INJECTION 0.25 MG/5ML
0.2500 mg | Freq: Once | INTRAVENOUS | Status: AC
  Administered 2022-05-18: 0.25 mg via INTRAVENOUS
  Filled 2022-05-18: qty 5

## 2022-05-18 MED ORDER — SODIUM CHLORIDE 0.9 % IV SOLN: Freq: Once | INTRAVENOUS | Status: AC

## 2022-05-18 MED ORDER — SODIUM CHLORIDE 0.9 % IV SOLN
10.0000 mg | Freq: Once | INTRAVENOUS | Status: AC
  Administered 2022-05-18: 10 mg via INTRAVENOUS
  Filled 2022-05-18: qty 10

## 2022-05-18 NOTE — Telephone Encounter (Signed)
[employer] 2021-05 - Specimen Collection Study to Evaluate Biomarkers in Subjects with Cancer   URCC-16070 - TREATMENT OF REFRACTORY NAUSEA  S2205, ICE COMPRESS: RANDOMIZED TRIAL OF LIMB CRYOCOMPRESSION VERSUS CONTINUOUS COMPRESSION VERSUS LOW CYCLIC COMPRESSION FOR THE PREVENTION  OF TAXANE-INDUCED PERIPHERAL NEUROPATHY  Patient did not return research's calls prior to starting treatment; patient will be screen-failed. Referring provider alerted. Will screen patient for DCP-001 at future visit.  Vickii Penna, RN, BSN, CPN Clinical Research Nurse I [phone]  05/18/2022 8:32 AM

## 2022-05-18 NOTE — Telephone Encounter (Signed)
Left VM for a return phone call to follow up from [facility] 11/1 and assess navigation needs. Also to inform her that we are working on getting her add bx's scheduled that she needs.

## 2022-05-18 NOTE — Patient Instructions (Addendum)
[name] ONCOLOGY  Discharge Instructions: Thank you for choosing [facility] to provide your oncology and hematology care.   If you have a lab appointment with the [facility], please go directly to the [facility] and check in at the registration area.   Wear comfortable clothing and clothing appropriate for easy access to any Portacath or PICC line.   We strive to give you quality time with your provider. You may need to reschedule your appointment if you arrive late (15 or more minutes).  Arriving late affects you and other patients whose appointments are after yours.  Also, if you miss three or more appointments without notifying the office, you may be dismissed from the clinic at the provider's discretion.      For prescription refill requests, have your pharmacy contact our office and allow 72 hours for refills to be completed.    Today you received the following chemotherapy and/or immunotherapy agents; Docetaxel, Carboplatin, Trastuzumab, & Pertuzumab      To help prevent nausea and vomiting after your treatment, we encourage you to take your nausea medication as directed.  BELOW ARE SYMPTOMS THAT SHOULD BE REPORTED IMMEDIATELY: *FEVER GREATER THAN 100.4 F (38 C) OR HIGHER *CHILLS OR SWEATING *NAUSEA AND VOMITING THAT IS NOT CONTROLLED WITH YOUR NAUSEA MEDICATION *UNUSUAL SHORTNESS OF BREATH *UNUSUAL BRUISING OR BLEEDING *URINARY PROBLEMS (pain or burning when urinating, or frequent urination) *BOWEL PROBLEMS (unusual diarrhea, constipation, pain near the anus) TENDERNESS IN MOUTH AND THROAT WITH OR WITHOUT PRESENCE OF ULCERS (sore throat, sores in mouth, or a toothache) UNUSUAL RASH, SWELLING OR PAIN  UNUSUAL VAGINAL DISCHARGE OR ITCHING   Items with * indicate a potential emergency and should be followed up as soon as possible or go to the Emergency Department if any problems should occur.  Please show the CHEMOTHERAPY ALERT CARD or  IMMUNOTHERAPY ALERT CARD at check-in to the Emergency Department and triage nurse.  Should you have questions after your visit or need to cancel or reschedule your appointment, please contact [facility]  Dept: [phone]  and follow the prompts.  Office hours are 8:00 a.m. to 4:30 p.m. Monday - Friday. Please note that voicemails left after 4:00 p.m. may not be returned until the following business day.  We are closed weekends and major holidays. You have access to a nurse at all times for urgent questions. Please call the main number to the clinic Dept: [phone] and follow the prompts.   For any non-urgent questions, you may also contact your provider using MyChart. We now offer e-Visits for anyone 71 and older to request care online for non-urgent symptoms. For details visit [email or web address].   Also download the MyChart app! Go to the app store, search "MyChart", open the app, select [name], and log in with your MyChart username and password.  Masks are optional in the cancer centers. If you would like for your care team to wear a mask while they are taking care of you, please let them know. You may have one support [name] [name] who is at least 44 years old accompany you for your appointments.

## 2022-05-19 ENCOUNTER — Encounter: Payer: Self-pay | Admitting: *Deleted

## 2022-05-20 ENCOUNTER — Inpatient Hospital Stay: Payer: 59

## 2022-05-20 VITALS — BP 107/70 | HR 86 | Temp 97.2°F | Resp 16 | Ht 68.0 in

## 2022-05-20 DIAGNOSIS — Z5111 Encounter for antineoplastic chemotherapy: Secondary | ICD-10-CM | POA: Diagnosis not present

## 2022-05-20 DIAGNOSIS — C50412 Malignant neoplasm of upper-outer quadrant of left female breast: Secondary | ICD-10-CM

## 2022-05-20 MED ORDER — PEGFILGRASTIM-JMDB 6 MG/0.6ML ~~LOC~~ SOSY
6.0000 mg | PREFILLED_SYRINGE | Freq: Once | SUBCUTANEOUS | Status: AC
  Administered 2022-05-20: 6 mg via SUBCUTANEOUS

## 2022-05-20 NOTE — Progress Notes (Signed)
Patient reports she is having some bilateral leg pain. She tolerated chemo well and knows to call the clinic with any concerns or questions.

## 2022-05-22 ENCOUNTER — Telehealth: Payer: Self-pay | Admitting: *Deleted

## 2022-05-22 NOTE — Telephone Encounter (Signed)
Called pt to see how she did with her recent treatment.  She reports "electric" feeling down legs.  Toes are numb & she has tingling from knees down & sometimes in hips.  She reports that it is hard for her to keep still.  She reports that this started Friday.  She denies any other problems.  She is eating some & drinking & no problems with bowels.  Message to Dr Janetta Hora.

## 2022-05-22 NOTE — Telephone Encounter (Signed)
-----   Message from Daphane Shepherd, RN sent at 05/18/2022  3:37 PM EST ----- Regarding: First time Chemo Patient of Dr. Chryl Heck, first time Carbo, Docetaxel, Trastuzumab, and Pertuzumab. Tolerated treatment great, VSS and no signs of distress at discharge.

## 2022-05-23 ENCOUNTER — Ambulatory Visit
Admission: RE | Admit: 2022-05-23 | Discharge: 2022-05-23 | Disposition: A | Discharge status: Home / Self Care | Payer: 59 | Source: Ambulatory Visit | Attending: Hematology and Oncology | Admitting: Hematology and Oncology

## 2022-05-23 ENCOUNTER — Telehealth: Payer: Self-pay | Admitting: *Deleted

## 2022-05-23 DIAGNOSIS — C50412 Malignant neoplasm of upper-outer quadrant of left female breast: Secondary | ICD-10-CM

## 2022-05-23 DIAGNOSIS — C50512 Malignant neoplasm of lower-outer quadrant of left female breast: Secondary | ICD-10-CM | POA: Diagnosis not present

## 2022-05-23 DIAGNOSIS — N6012 Diffuse cystic mastopathy of left breast: Secondary | ICD-10-CM | POA: Diagnosis not present

## 2022-05-23 DIAGNOSIS — N6342 Unspecified lump in left breast, subareolar: Secondary | ICD-10-CM | POA: Diagnosis not present

## 2022-05-23 MED ORDER — GADOPICLENOL 0.5 MMOL/ML IV SOLN
10.0000 mL | Freq: Once | INTRAVENOUS | Status: AC | PRN
  Administered 2022-05-23: 10 mL via INTRAVENOUS

## 2022-05-23 NOTE — Telephone Encounter (Signed)
This RN contacted pt per communication received from edu nurse stating onset of symptoms post therapy of "electric " feelings down bilateral legs.  Per pt she states discomfort is mainly from her knees down- and "over the weekend was the worse"  She states symptoms started on Friday 11/10  day post her 1st TCHP with pegfilgrastim on 11/11.  She states presently symptoms have lessened- and she finds application of ice is most beneficial.  Of note pt has on her health history-fibromyalgia- but per inquiry she states she mainly has trigger points in her neck and shoulders.  She states over the weekend she noted her legs felt "wobbley".  Otherwise pt is doing well- able to eat and drink and denies any nausea.  This note will be forwarded to MD for review of symptoms and any further recommendations.

## 2022-05-24 ENCOUNTER — Telehealth: Payer: Self-pay | Admitting: *Deleted

## 2022-05-24 ENCOUNTER — Encounter: Payer: Self-pay | Admitting: *Deleted

## 2022-05-24 ENCOUNTER — Other Ambulatory Visit: Payer: Self-pay | Admitting: *Deleted

## 2022-05-24 DIAGNOSIS — Z170 Estrogen receptor positive status [ER+]: Secondary | ICD-10-CM

## 2022-05-24 NOTE — Telephone Encounter (Signed)
This RN contacted pt today per noted symptom of bilateral lower leg pain. She states pain is improving from onset on 11/10 post 1st therapy.  Pt is scheduled to see APP tomorrow- this note will be forwarded to the provider - see prior note from 11/14 for description of symptoms.

## 2022-05-25 ENCOUNTER — Inpatient Hospital Stay: Payer: 59

## 2022-05-25 ENCOUNTER — Encounter: Payer: Self-pay | Admitting: Adult Health

## 2022-05-25 ENCOUNTER — Encounter: Payer: Self-pay | Admitting: *Deleted

## 2022-05-25 ENCOUNTER — Inpatient Hospital Stay (HOSPITAL_BASED_OUTPATIENT_CLINIC_OR_DEPARTMENT_OTHER): Payer: 59 | Admitting: Adult Health

## 2022-05-25 VITALS — BP 106/77 | HR 102 | Temp 98.1°F | Resp 18 | Ht 68.0 in | Wt 185.0 lb

## 2022-05-25 DIAGNOSIS — Z170 Estrogen receptor positive status [ER+]: Secondary | ICD-10-CM | POA: Diagnosis not present

## 2022-05-25 DIAGNOSIS — C50412 Malignant neoplasm of upper-outer quadrant of left female breast: Secondary | ICD-10-CM

## 2022-05-25 DIAGNOSIS — Z5111 Encounter for antineoplastic chemotherapy: Secondary | ICD-10-CM | POA: Diagnosis not present

## 2022-05-25 LAB — CMP (CANCER CENTER ONLY)
ALT: 67 U/L — ABNORMAL HIGH (ref 0–44)
AST: 43 U/L — ABNORMAL HIGH (ref 15–41)
Albumin: 4.2 g/dL (ref 3.5–5.0)
Alkaline Phosphatase: 84 U/L (ref 38–126)
Anion gap: 6 (ref 5–15)
BUN: 6 mg/dL (ref 6–20)
CO2: 31 mmol/L (ref 22–32)
Calcium: 9.5 mg/dL (ref 8.9–10.3)
Chloride: 99 mmol/L (ref 98–111)
Creatinine: 0.86 mg/dL (ref 0.44–1.00)
GFR, Estimated: >60 mL/min (ref >60)
Glucose, Bld: 105 mg/dL — ABNORMAL HIGH (ref 70–99)
Potassium: 3.7 mmol/L (ref 3.5–5.1)
Sodium: 136 mmol/L (ref 135–145)
Total Bilirubin: 1.5 mg/dL — ABNORMAL HIGH (ref 0.3–1.2)
Total Protein: 7.1 g/dL (ref 6.5–8.1)

## 2022-05-25 LAB — CBC WITH DIFFERENTIAL (CANCER CENTER ONLY)
Abs Immature Granulocytes: 0.64 10*3/uL — ABNORMAL HIGH (ref 0.00–0.07)
Basophils Absolute: 0 10*3/uL (ref 0.0–0.1)
Basophils Relative: 1 %
Eosinophils Absolute: 0.1 10*3/uL (ref 0.0–0.5)
Eosinophils Relative: 2 %
HCT: 34.3 % — ABNORMAL LOW (ref 36.0–46.0)
Hemoglobin: 12.4 g/dL (ref 12.0–15.0)
Immature Granulocytes: 12 %
Lymphocytes Relative: 17 %
Lymphs Abs: 0.9 10*3/uL (ref 0.7–4.0)
MCH: 31.2 pg (ref 26.0–34.0)
MCHC: 36.2 g/dL — ABNORMAL HIGH (ref 30.0–36.0)
MCV: 86.2 fL (ref 80.0–100.0)
Monocytes Absolute: 0.9 10*3/uL (ref 0.1–1.0)
Monocytes Relative: 18 %
Neutro Abs: 2.7 10*3/uL (ref 1.7–7.7)
Neutrophils Relative %: 50 %
Platelet Count: 87 10*3/uL — ABNORMAL LOW (ref 150–400)
RBC: 3.98 MIL/uL (ref 3.87–5.11)
RDW: 11.8 % (ref 11.5–15.5)
WBC Count: 5.3 10*3/uL (ref 4.0–10.5)
nRBC: 0.4 % — ABNORMAL HIGH (ref 0.0–0.2)

## 2022-05-25 MED ORDER — DIPHENOXYLATE-ATROPINE 2.5-0.025 MG PO TABS: 1.0000 | ORAL_TABLET | Freq: Four times a day (QID) | ORAL | 0 refills | Status: DC | PRN

## 2022-05-25 MED ORDER — CHOLESTYRAMINE 4 G PO PACK: 4.0000 g | PACK | Freq: Two times a day (BID) | ORAL | 0 refills | Status: DC

## 2022-05-25 MED ORDER — SODIUM CHLORIDE 0.9 % IV SOLN: INTRAVENOUS | Status: AC

## 2022-05-25 NOTE — Patient Instructions (Addendum)

## 2022-05-25 NOTE — Progress Notes (Signed)
[name], [name], DO [address] [place] [address]   DIAGNOSIS:  Cancer Staging  Malignant neoplasm of upper-outer quadrant of left breast in female, estrogen receptor positive ([place]) Staging form: Breast, AJCC 8th Edition - Clinical stage from 05/08/2022: Stage IB (cT2, cN0, cM0, G2, ER+, PR+, HER2+) - Signed by Hayden Pedro, PA-C on 05/08/2022 Stage prefix: Initial diagnosis Method of lymph node assessment: Clinical Histologic grading system: 3 grade system   SUMMARY OF ONCOLOGIC HISTORY: Oncology History  Malignant neoplasm of upper-outer quadrant of left breast in female, estrogen receptor positive ([place])  04/25/2022 Imaging   Patient had diagnostic bilateral mammogram given palpable mass in the left breast.  This showed a 2.6 x 3.5 x 3 cm irregular mass as well as possible satellite mass which is indeterminate.  An ultrasound was recommended.  Ultrasound of the left breast mass showed 3.9 x 2.4 x 2.3 cm irregular mass with an irregular and spiculated margin at 3:00 middle depth 5 cm from the nipple, irregular mass is hypoechoic.  In the left breast at 1 o'clock position, 3 cm from the nipple there is a round mass measuring 0.3 x 0.2 x 0.3 cm.  No significant abnormalities were seen sonographically in the left axilla.  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  Left breast needle core biopsy at 1:00 3 cm from the nipple showed fibrocystic changes with usual ductal hyperplasia negative for malignancy.  Prognostic showed ER 95% PR 40%, HER2 3+ KI of 50%   05/08/2022 Initial Diagnosis   Malignant neoplasm of upper-outer quadrant of left breast in female, estrogen receptor positive  ([place])   05/08/2022 Cancer Staging   Staging form: Breast, AJCC 8th Edition - Clinical stage from 05/08/2022: Stage IB (cT2, cN0, cM0, G2, ER+, PR+, HER2+) - Signed by Hayden Pedro, PA-C on 05/08/2022 Stage prefix: Initial diagnosis Method of lymph node assessment: Clinical Histologic grading system: 3 grade system   05/18/2022 -  Chemotherapy   Patient is on Treatment Plan : BREAST  Docetaxel + Carboplatin + Trastuzumab + Pertuzumab  (TCHP) q21d        CURRENT THERAPY: TCHP cycle 1 day 8  INTERVAL HISTORY: [name] [name] 46 y.o. female returns for follow-up after her first cycle of TCHP.  She has been experiencing diarrhea since she received the treatment.  She is having about 10 bowel movements per day.  She took imodium as many as 6 per day without relief.  She has also experienced nausea/vomiting.  This began about 3 days ago on Monday. She has been alternating Zofran and compazine which has helped for the most part.    She also has been experiencing leg pain since last Friday.  This has improved with ice wraps for her legs and feet.  She denies any numbness or tingling in her fingertips or toes.    Patient Active Problem List   Diagnosis Date Noted   Family history of breast cancer 05/10/2022   Family history of ovarian cancer 05/10/2022   Malignant neoplasm of upper-outer quadrant of left breast in female, estrogen receptor positive ([place]) 05/08/2022   Food impaction of esophagus    Schatzki's ring  Hx of migraine headaches 11/17/2013   Ureteral calculus 11/17/2013   GERD (gastroesophageal reflux disease) 11/17/2013   Esophageal obstruction due to food impaction 11/17/2013    is allergic to amoxicillin, coconut (cocos nucifera), penicillins, duloxetine, hydrocodone, hydrocodone-acetaminophen, and moxifloxacin.  MEDICAL HISTORY: Past Medical History:  Diagnosis Date   Anemia    history of   Breast cancer, left ([place])    Dyspnea    even with speaking    Family history of adverse reaction to anesthesia    mother slow to awaken   Family history of breast cancer 05/10/2022   Family history of ovarian cancer 05/10/2022   Fibromyalgia    GERD (gastroesophageal reflux disease)    History of kidney stones    last stone 6 months ago    Migraine    Seasonal allergies    Tinnitus    Vertigo     SURGICAL HISTORY: Past Surgical History:  Procedure Laterality Date   ADENOIDECTOMY  1991   CYSTOSCOPY WITH RETROGRADE PYELOGRAM, URETEROSCOPY AND STENT PLACEMENT Left 02/22/2021   Procedure: [place], URETEROSCOPY AND STENT PLACEMENT;  Surgeon: Remi Haggard, MD;  Location: [facility];  Service: Urology;  Laterality: Left;   CYSTOSCOPY/URETEROSCOPY/HOLMIUM LASER/STENT PLACEMENT Left 03/10/2021   Procedure: CYSTOSCOPY RETROGRADE, LEFT URETEROSCOPY/ STONE BASKETRY [place] LASER/STENT EXCHANGE;  Surgeon: Ardis Hughs, MD;  Location: [facility];  Service: Urology;  Laterality: Left;   ESOPHAGOGASTRODUODENOSCOPY N/A 11/17/2013   Procedure: ESOPHAGOGASTRODUODENOSCOPY (EGD);  Surgeon: Milus Banister, MD;  Location: [facility];  Service: Endoscopy;  Laterality: N/A;   ESOPHAGOGASTRODUODENOSCOPY N/A 10/15/2015   Procedure: ESOPHAGOGASTRODUODENOSCOPY (EGD);  Surgeon: Gatha Mayer, MD;  Location[facility];  Service: Endoscopy;  Laterality: N/A;   HOLMIUM LASER APPLICATION  10/12/3644   Procedure: HOLMIUM LASER APPLICATION;  Surgeon: Ardis Hughs, MD;  Location: [facility];  Service: Urology;;   NASAL SEPTOPLASTY W/ TURBINOPLASTY Bilateral 03/18/2018   Procedure: BILATERAL NASAL SEPTOPLASTY WITH TURBINATE REDUCTION;  Surgeon: Jodi Marble, MD;  Location: [facility];  Service: ENT;  Laterality: Bilateral;   PORTACATH PLACEMENT Right 05/17/2022   Procedure: PORT-A-CATH INSERTION WITH ULTRASOUND GUIDANCE;  Surgeon: [name], MD;  Location: [facility];  Service:  General;  Laterality: Right;   TONSILLECTOMY  1991   TUBAL LIGATION  2005    SOCIAL HISTORY: Social History   Socioeconomic History   Marital status: Single    Spouse name: Not on file   Number of children: Not on file   Years of education: Not on file   Highest education level: Not on file  Occupational History   Not on file  Tobacco Use   Smoking status: Never   Smokeless tobacco: Never  Vaping Use   Vaping Use: Never used  Substance and Sexual Activity   Alcohol use: No   Drug use: No   Sexual activity: Not on file  Other Topics Concern   Not on file  Social History Narrative   ** Merged History Encounter **       Social Determinants of Health   Financial Resource Strain: Low Risk  (05/10/2022)   Overall Financial Resource Strain (CARDIA)    Difficulty of Paying Living Expenses: Not very hard  Food Insecurity: No Food Insecurity (05/10/2022)   Hunger Vital Sign    Worried About Running Out of Food in the Last Year: Never true    Ran Out of Food in the Last Year: Never true  Transportation Needs: No  Transportation Needs (05/10/2022)   PRAPARE - Hydrologist (Medical): No    Lack of Transportation (Non-Medical): No  Physical Activity: Not on file  Stress: Not on file  Social Connections: Not on file  Intimate Partner Violence: Not on file    FAMILY HISTORY: Family History  Problem Relation Age of Onset   Asthma Mother    Diabetes Mother    Colon polyps Mother    Ovarian cancer Maternal Grandmother 65   Diabetes Maternal Grandmother    Esophageal cancer Maternal Grandfather        d. 51   Breast cancer Other        MGM's sisters x2   Cervical cancer Cousin        mat cousin; dx <40    Review of Systems  Constitutional:  Negative for appetite change, chills, fatigue, fever and unexpected weight change.  HENT:   Negative for hearing loss, lump/mass and trouble swallowing.   Eyes:  Negative for eye problems and icterus.   Respiratory:  Negative for chest tightness, cough and shortness of breath.   Cardiovascular:  Negative for chest pain, leg swelling and palpitations.  Gastrointestinal:  Positive for diarrhea. Negative for abdominal distention, abdominal pain, constipation, nausea and vomiting.  Endocrine: Negative for hot flashes.  Genitourinary:  Negative for difficulty urinating.   Musculoskeletal:  Negative for arthralgias.  Skin:  Negative for itching and rash.  Neurological:  Negative for dizziness, extremity weakness, headaches and numbness.  Hematological:  Negative for adenopathy. Does not bruise/bleed easily.  Psychiatric/Behavioral:  Negative for depression. The patient is not nervous/anxious.       PHYSICAL EXAMINATION  ECOG PERFORMANCE STATUS: 1 - Symptomatic but completely ambulatory  Vitals:   05/25/22 1526  BP: 106/77  Pulse: (!) 102  Resp: 18  Temp: 98.1 F (36.7 C)  SpO2: 100%    Physical Exam Constitutional:      General: She is not in acute distress.    Appearance: Normal appearance. She is not toxic-appearing.  HENT:     Head: Normocephalic and atraumatic.  Eyes:     General: No scleral icterus. Cardiovascular:     Rate and Rhythm: Normal rate and regular rhythm.     Pulses: Normal pulses.     Heart sounds: Normal heart sounds.  Pulmonary:     Effort: Pulmonary effort is normal.     Breath sounds: Normal breath sounds.  Abdominal:     General: Abdomen is flat. Bowel sounds are normal. There is no distension.     Palpations: Abdomen is soft.     Tenderness: There is no abdominal tenderness.  Musculoskeletal:        General: No swelling.     Cervical back: Neck supple.  Lymphadenopathy:     Cervical: No cervical adenopathy.  Skin:    General: Skin is warm and dry.     Findings: No rash.  Neurological:     General: No focal deficit present.     Mental Status: She is alert.  Psychiatric:        Mood and Affect: Mood normal.        Behavior: Behavior  normal.     LABORATORY DATA:  CBC    Component Value Date/Time   WBC 5.3 05/25/2022 1434   WBC 8.2 02/21/2021 1833   RBC 3.98 05/25/2022 1434   HGB 12.4 05/25/2022 1434   HCT 34.3 (L) 05/25/2022 1434   PLT 87 (L) 05/25/2022 1434  MCV 86.2 05/25/2022 1434   MCH 31.2 05/25/2022 1434   MCHC 36.2 (H) 05/25/2022 1434   RDW 11.8 05/25/2022 1434   LYMPHSABS 0.9 05/25/2022 1434   MONOABS 0.9 05/25/2022 1434   EOSABS 0.1 05/25/2022 1434   BASOSABS 0.0 05/25/2022 1434    CMP     Component Value Date/Time   NA 136 05/25/2022 1434   K 3.7 05/25/2022 1434   CL 99 05/25/2022 1434   CO2 31 05/25/2022 1434   GLUCOSE 105 (H) 05/25/2022 1434   BUN 6 05/25/2022 1434   CREATININE 0.86 05/25/2022 1434   CALCIUM 9.5 05/25/2022 1434   PROT 7.1 05/25/2022 1434   ALBUMIN 4.2 05/25/2022 1434   AST 43 (H) 05/25/2022 1434   ALT 67 (H) 05/25/2022 1434   ALKPHOS 84 05/25/2022 1434   BILITOT 1.5 (H) 05/25/2022 1434   GFRNONAA >60 05/25/2022 1434   GFRAA >90 11/04/2014 1100     ASSESSMENT and THERAPY PLAN:   Malignant neoplasm of upper-outer quadrant of left breast in female, estrogen receptor positive (HCC) Carlyne is a 46 year old woman with stage IB triple positive breast cancer diagnosed in 04/2022 here today for follow up after receiving her first cycle of neoadjuvant TCHP.    Triple positive breast cancer: She is s/p TCHP and we discussed the plan is for her to receive 6 cycles of neoadjuvant chemotherapy followed by breast MRI, surgery, maintenance Herceptin, radiation (if indicated), and antiestrogen therapy.    She has experienced increased side effects from her initial therapy (listed below), and we discussed how holding perjeta with cycle 2 is an option.  Many times it can be re-introduced with cycle 3 and patients tolerate it better.    Diarrhea: I prescribed Questran for her to take BID and lomotil to alternate with imodium if needed.  I am hopeful this will improve her diarrhea.    Dehydration from diarrhea: She will receive one liter of IV fluids today.  Should her diarrhea persist she will call us if she wants additional IV fluids tomorrow, Saturday, or next week.    Bone pain: This is likely secondary to the Neulasta she received.  She will continue her at home remedies.    Emberli will return in 2 weeks for labs, f/u, and her next treatment.     All questions were answered. The patient knows to call the clinic with any problems, questions or concerns. We can certainly see the patient much sooner if necessary.  Total encounter time:30 minutes*in face-to-face visit time, chart review, lab review, care coordination, order entry, and documentation of the encounter time.  Wilber Bihari, NP 05/28/22 5:48 PM Medical Oncology and Hematology [facility] [place], [place] [address] Tel. [phone]    Fax. [phone]  *Total Encounter Time as defined by the Centers for Medicare and Medicaid Services includes, in addition to the face-to-face time of a patient visit (documented in the note above) non-face-to-face time: obtaining and reviewing outside history, ordering and reviewing medications, tests or procedures, care coordination (communications with other health care professionals or caregivers) and documentation in the medical record.

## 2022-05-26 ENCOUNTER — Telehealth: Payer: Self-pay | Admitting: Adult Health

## 2022-05-26 NOTE — Telephone Encounter (Signed)
Scheduled appointment per WQ. Patient is aware. 

## 2022-05-27 ENCOUNTER — Other Ambulatory Visit: Payer: Self-pay

## 2022-05-28 ENCOUNTER — Encounter: Payer: Self-pay | Admitting: Hematology and Oncology

## 2022-05-28 NOTE — Assessment & Plan Note (Signed)
[name] [name] is a 46 year old woman with stage IB triple positive breast cancer diagnosed in 04/2022 here today for follow up after receiving her first cycle of neoadjuvant TCHP.    Triple positive breast cancer: She is s/p TCHP and we discussed the plan is for her to receive 6 cycles of neoadjuvant chemotherapy followed by breast MRI, surgery, maintenance Herceptin, radiation (if indicated), and antiestrogen therapy.    She has experienced increased side effects from her initial therapy (listed below), and we discussed how holding perjeta with cycle 2 is an option.  Many times it can be re-introduced with cycle 3 and patients tolerate it better.    Diarrhea: I prescribed Questran for her to take BID and lomotil to alternate with imodium if needed.  I am hopeful this will improve her diarrhea.   Dehydration from diarrhea: She will receive one liter of IV fluids today.  Should her diarrhea persist she will call us if she wants additional IV fluids tomorrow, Saturday, or next week.    Bone pain: This is likely secondary to the Neulasta she received.  She will continue her at home remedies.    [name] [name] will return in 2 weeks for labs, f/u, and her next treatment.

## 2022-05-30 ENCOUNTER — Telehealth: Payer: Self-pay | Admitting: Genetic Counselor

## 2022-05-30 ENCOUNTER — Ambulatory Visit: Payer: Self-pay | Admitting: Genetic Counselor

## 2022-05-30 ENCOUNTER — Encounter: Payer: Self-pay | Admitting: Genetic Counselor

## 2022-05-30 DIAGNOSIS — Z1379 Encounter for other screening for genetic and chromosomal anomalies: Secondary | ICD-10-CM | POA: Insufficient documentation

## 2022-05-30 DIAGNOSIS — Z8041 Family history of malignant neoplasm of ovary: Secondary | ICD-10-CM

## 2022-05-30 DIAGNOSIS — Z803 Family history of malignant neoplasm of breast: Secondary | ICD-10-CM

## 2022-05-30 DIAGNOSIS — Z170 Estrogen receptor positive status [ER+]: Secondary | ICD-10-CM

## 2022-05-30 NOTE — Progress Notes (Signed)
HPI:   [name] [name] was previously seen in the [facility] due to a personal history of breast cancer and concerns regarding a hereditary predisposition to cancer. Please refer to our prior cancer genetics clinic note for more information regarding our discussion, assessment and recommendations, at the time. [name] [name] recent genetic test results were disclosed to her, as were recommendations warranted by these results. These results and recommendations are discussed in more detail below.  CANCER HISTORY:   In October 2023, at the age of 46, [name] [name] was diagnosed with invasive ductal carcinoma of the right breast (ER+/PR+/HER2+).    Oncology History  Malignant neoplasm of upper-outer quadrant of left breast in female, estrogen receptor positive ([place])  04/25/2022 Imaging   Patient had diagnostic bilateral mammogram given palpable mass in the left breast.  This showed a 2.6 x 3.5 x 3 cm irregular mass as well as possible satellite mass which is indeterminate.  An ultrasound was recommended.  Ultrasound of the left breast mass showed 3.9 x 2.4 x 2.3 cm irregular mass with an irregular and spiculated margin at 3:00 middle depth 5 cm from the nipple, irregular mass is hypoechoic.  In the left breast at 1 o'clock position, 3 cm from the nipple there is a round mass measuring 0.3 x 0.2 x 0.3 cm.  No significant abnormalities were seen sonographically in the left axilla.  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  Left breast needle core biopsy at 1:00 3 cm from the nipple showed fibrocystic changes with usual ductal hyperplasia negative for malignancy.  Prognostic showed ER 95% PR 40%, HER2 3+ KI of 50%   05/08/2022 Initial Diagnosis    Malignant neoplasm of upper-outer quadrant of left breast in female, estrogen receptor positive ([place])   05/08/2022 Cancer Staging   Staging form: Breast, AJCC 8th Edition - Clinical stage from 05/08/2022: Stage IB (cT2, cN0, cM0, G2, ER+, PR+, HER2+) - Signed by Hayden Pedro, PA-C on 05/08/2022 Stage prefix: Initial diagnosis Method of lymph node assessment: Clinical Histologic grading system: 3 grade system   05/18/2022 -  Chemotherapy   Patient is on Treatment Plan : BREAST  Docetaxel + Carboplatin + Trastuzumab + Pertuzumab  (TCHP) q21d      05/23/2022 Genetic Testing   Negative genetics for [facility] CustomNext-Cancer +RNAinsight Panel.  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 Report date is 05/23/2022.   The CustomNext-Cancer+RNAinsight panel offered by [facility] includes sequencing and rearrangement analysis for the following 47 genes:  APC, ATM, AXIN2, BARD1, BMPR1A, BRCA1, BRCA2, BRIP1, CDH1, CDK4, CDKN2A, CHEK2, DICER1, EPCAM, GREM1, HOXB13, MEN1, MLH1, MSH2, MSH3, MSH6, MUTYH, NBN, NF1, NF2, NTHL1, PALB2, PMS2, POLD1, POLE, PTEN, RAD51C, RAD51D, RECQL, RET, SDHA, SDHAF2, SDHB, SDHC, SDHD, SMAD4, SMARCA4, STK11, TP53, TSC1, TSC2, and VHL.  RNA data is routinely analyzed for use in variant interpretation for all genes.      FAMILY HISTORY:  We obtained a detailed, 4-generation family history.  Significant diagnoses are listed below:      Family History  Problem Relation Age of Onset   Colon polyps Mother     Ovarian cancer Maternal Grandmother 66   Esophageal cancer Maternal Grandfather          d. 49  Breast cancer Other          MGM's sisters x2   Cervical cancer Cousin          mat cousin; dx <40     [name] [name] is unaware of previous family history of genetic testing for hereditary cancer risks. There is no reported Ashkenazi Jewish ancestry. There is no known consanguinity.  GENETIC TEST RESULTS:  The [facility] CustomNext-Cancer +RNAinsight Panel found no  pathogenic mutations.   The CustomNext-Cancer+RNAinsight panel offered by [facility] includes sequencing and rearrangement analysis for the following 47 genes:  APC, ATM, AXIN2, BARD1, BMPR1A, BRCA1, BRCA2, BRIP1, CDH1, CDK4, CDKN2A, CHEK2, DICER1, EPCAM, GREM1, HOXB13, MEN1, MLH1, MSH2, MSH3, MSH6, MUTYH, NBN, NF1, NF2, NTHL1, PALB2, PMS2, POLD1, POLE, PTEN, RAD51C, RAD51D, RECQL, RET, SDHA, SDHAF2, SDHB, SDHC, SDHD, SMAD4, SMARCA4, STK11, TP53, TSC1, TSC2, and VHL.  RNA data is routinely analyzed for use in variant interpretation for all genes. .   The test report has been scanned into EPIC and is located under the Molecular Pathology section of the Results Review tab.  A portion of the result report is included below for reference. Genetic testing reported out on May 23, 2022.     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  At this time, it is unknown if this variant is associated with an increased risk for cancer or if it is benign, but most uncertain variants are reclassified to benign. It should not be used to make medical management decisions. With time, we suspect the laboratory will determine the significance of this variant, if any. If the laboratory reclassifies this variant, we will attempt to contact [name] [name] to discuss it further.   Even though a pathogenic variant was not identified, possible explanations for the cancer in the family may include: There may be no hereditary risk for cancer in the family. The cancers in Ms. Hughlett and/or her family may be sporadic/familial or due to other genetic and environmental factors. There may be a gene mutation in one of these genes that current testing methods cannot detect but that chance is small. There could be another gene that has not yet been discovered, or that we have not yet tested, that is responsible for the cancer diagnoses in the family.  It is also  possible there is a hereditary cause for the cancer in the family that [name] [name] did not inherit. The variant of uncertain significance detected in the ATM gene may be reclassified as a pathogenic variant in the future. At this time, we do not know if this variant increases the risk for cancer.  Therefore, it is important to remain in touch with cancer genetics in the future so that we can continue to offer [name] [name] the most up to date genetic testing.   ADDITIONAL GENETIC TESTING:  We discussed with [name] [name] that her genetic testing was fairly extensive.  If there are additional relevant genes identified to increase cancer risk that can be analyzed in the future, we would be happy to discuss and coordinate this testing at that time.      CANCER SCREENING RECOMMENDATIONS:  [name] [name] test result is considered negative (normal).  This means that we have not identified a hereditary cause for her personal history of breast cancer at this time.    An individual's cancer risk and medical management are not determined by genetic test results alone. Overall cancer risk assessment incorporates additional  factors, including personal medical history, family history, and any available genetic information that may result in a personalized plan for cancer prevention and surveillance. Therefore, it is recommended she continue to follow the cancer management and screening guidelines provided by her oncology and primary healthcare provider.   RECOMMENDATIONS FOR FAMILY MEMBERS:   Since she did not inherit a identifiable mutation in a cancer predisposition gene included on this panel, her children could not have inherited a known mutation from her in one of these genes. Individuals in this family might be at some increased risk of developing cancer, over the general population risk, due to the family history of cancer.  Individuals in the family should notify their providers of the family history of  cancer. We recommend women in this family have a yearly mammogram beginning at age 69, or 7 years younger than the earliest onset of cancer, an annual clinical breast exam, and perform monthly breast self-exams. Other members of the family may still carry a pathogenic variant in one of these genes that [name] [name] did not inherit. Based on the family history, we recommend her mother have genetic counseling and testing. Results are pending. We do not recommend familial testing for the ATM variant of uncertain significance (VUS).  FOLLOW-UP:  Lastly, we discussed with [name] [name] that cancer genetics is a rapidly advancing field and it is possible that new genetic tests will be appropriate for her and/or her family members in the future. We encouraged her to remain in contact with cancer genetics on an annual basis so we can update her personal and family histories and let her know of advances in cancer genetics that may benefit this family.   Our contact number was provided. [name] [name] questions were answered to her satisfaction, and she knows she is welcome to call us at anytime with additional questions or concerns.   Aja Bolander M. Joette Catching, [place], [facility] Genetic Counselor [email or web address] ([phone]

## 2022-05-30 NOTE — Telephone Encounter (Signed)
Contacted patient in attempt to disclose results of genetic testing.  LVM with contact information requesting a call back.  

## 2022-05-30 NOTE — Telephone Encounter (Signed)
Revealed negative genetics and VUS in ATM.

## 2022-05-31 ENCOUNTER — Telehealth: Payer: Self-pay | Admitting: *Deleted

## 2022-05-31 NOTE — Telephone Encounter (Signed)
Received call from pt with complaint of headache and clear nasal drainage.  Pt denies fever or cough at this time. Per MD pt to take OTC Mucinex DM and contact our office if symptoms do not improve.  Pt educated and verbalized understanding.

## 2022-06-02 ENCOUNTER — Other Ambulatory Visit: Payer: Self-pay | Admitting: Adult Health

## 2022-06-02 DIAGNOSIS — C50412 Malignant neoplasm of upper-outer quadrant of left female breast: Secondary | ICD-10-CM

## 2022-06-02 NOTE — Progress Notes (Signed)
ADDENDUM REPORT: 05/30/2022 11:48   ADDENDUM: Pathology revealed #1 BENIGN BREAST TISSUE of the LEFT breast, lower outer quadrant, anterior to know malignancy, (barbell clip). This was found to be discordant by Dr. Dorise Bullion, with excision recommended.   Pathology revealed #2 FOCAL FIBROCYSTIC CHANGES. 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 This was found to be concordant by Dr. Dorise Bullion.   Pathology results were discussed with the patient by telephone. The patient reported doing well after the biopsies with tenderness at the sites. Post biopsy instructions and care were reviewed and questions were answered. The patient was encouraged to call [facility] for any additional concerns.   Recommendation: Excise known cancer and lesion #1. Lesion #2 is concordant with 6 month MRI follow up per protocol.   Pathology results reported by Stacie Acres RN on 05/25/2022.     Electronically Signed   By: Dorise Bullion III M.D.   On: 05/30/2022 11:48  CLINICAL DATA:  Biopsy of a mass anterior to the known malignancy in the lower outer left breast. Biopsy of a mass in the central left breast.   EXAM: MRI GUIDED CORE NEEDLE BIOPSY OF THE LEFT BREAST   TECHNIQUE: Multiplanar, multisequence MR imaging of the left breast was performed both before and after administration of intravenous contrast.   CONTRAST:  10 mL Vueway   COMPARISON:  Previous exam(s).   FINDINGS: I met with the patient, and we discussed the procedure of MRI guided biopsy, including risks, benefits, and alternatives. Specifically, we discussed the risks of infection, bleeding, tissue injury, clip migration, and inadequate sampling. Informed, written consent was given. The usual time out protocol was performed immediately prior to the procedure.   Using sterile technique, 1% Lidocaine, MRI guidance, and a 9 gauge vacuum  assisted device, biopsy was performed of the mass in the lower outer quadrant, anterior to the known malignancy using a lateral approach. At the conclusion of the procedure, a barbell shaped tissue marker clip was deployed into the biopsy cavity. Follow-up 2-view mammogram was performed and dictated separately.   Using sterile technique, 1% Lidocaine, MRI guidance, and a 9 gauge vacuum assisted device, biopsy was performed of a mass in the central left breast using a lateral approach. At the conclusion of the procedure, a cylinder shaped tissue marker clip was deployed into the biopsy cavity. Follow-up 2-view mammogram was performed and dictated separately.   IMPRESSION: MRI guided biopsy of both a mass in the lower outer left breast and a mass in the central left breast. No apparent complications.   There is a hematoma surrounding the lower outer biopsy site measuring 2.6 cm.   Electronically Signed: By: Dorise Bullion III M.D. On: 05/23/2022 12:47  I went ahead and tentatively placed MRI orders for 6 months from now based on these recommendations that can be adjusted or canceled in the future if needed.  Wilber Bihari, NP 06/02/22 3:34 PM Medical Oncology and Hematology [facility] [place], [place] [address] Tel. [phone]    Fax. [phone]

## 2022-06-05 ENCOUNTER — Encounter: Payer: Self-pay | Admitting: *Deleted

## 2022-06-08 ENCOUNTER — Other Ambulatory Visit: Payer: Self-pay | Admitting: *Deleted

## 2022-06-08 ENCOUNTER — Encounter: Payer: Self-pay | Admitting: Hematology and Oncology

## 2022-06-08 ENCOUNTER — Inpatient Hospital Stay: Payer: 59

## 2022-06-08 ENCOUNTER — Inpatient Hospital Stay (HOSPITAL_BASED_OUTPATIENT_CLINIC_OR_DEPARTMENT_OTHER): Payer: 59 | Admitting: Hematology and Oncology

## 2022-06-08 ENCOUNTER — Inpatient Hospital Stay: Payer: 59 | Admitting: Nutrition

## 2022-06-08 VITALS — BP 125/82 | HR 64 | Resp 17

## 2022-06-08 VITALS — BP 129/74 | HR 63 | Temp 97.9°F | Resp 18 | Wt 187.3 lb

## 2022-06-08 DIAGNOSIS — Z8041 Family history of malignant neoplasm of ovary: Secondary | ICD-10-CM | POA: Diagnosis not present

## 2022-06-08 DIAGNOSIS — R110 Nausea: Secondary | ICD-10-CM | POA: Diagnosis not present

## 2022-06-08 DIAGNOSIS — K521 Toxic gastroenteritis and colitis: Secondary | ICD-10-CM | POA: Diagnosis not present

## 2022-06-08 DIAGNOSIS — Z800 Family history of malignant neoplasm of digestive organs: Secondary | ICD-10-CM | POA: Diagnosis not present

## 2022-06-08 DIAGNOSIS — C50412 Malignant neoplasm of upper-outer quadrant of left female breast: Secondary | ICD-10-CM | POA: Diagnosis not present

## 2022-06-08 DIAGNOSIS — Z803 Family history of malignant neoplasm of breast: Secondary | ICD-10-CM | POA: Diagnosis not present

## 2022-06-08 DIAGNOSIS — T451X5A Adverse effect of antineoplastic and immunosuppressive drugs, initial encounter: Secondary | ICD-10-CM | POA: Diagnosis not present

## 2022-06-08 DIAGNOSIS — Z170 Estrogen receptor positive status [ER+]: Secondary | ICD-10-CM

## 2022-06-08 DIAGNOSIS — Z5111 Encounter for antineoplastic chemotherapy: Secondary | ICD-10-CM | POA: Diagnosis not present

## 2022-06-08 LAB — CBC WITH DIFFERENTIAL (CANCER CENTER ONLY)
Abs Immature Granulocytes: 0.1 10*3/uL — ABNORMAL HIGH (ref 0.00–0.07)
Basophils Absolute: 0 10*3/uL (ref 0.0–0.1)
Basophils Relative: 0 %
Eosinophils Absolute: 0 10*3/uL (ref 0.0–0.5)
Eosinophils Relative: 0 %
HCT: 31.8 % — ABNORMAL LOW (ref 36.0–46.0)
Hemoglobin: 11 g/dL — ABNORMAL LOW (ref 12.0–15.0)
Immature Granulocytes: 1 %
Lymphocytes Relative: 9 %
Lymphs Abs: 0.9 10*3/uL (ref 0.7–4.0)
MCH: 31.2 pg (ref 26.0–34.0)
MCHC: 34.6 g/dL (ref 30.0–36.0)
MCV: 90.1 fL (ref 80.0–100.0)
Monocytes Absolute: 0.2 10*3/uL (ref 0.1–1.0)
Monocytes Relative: 2 %
Neutro Abs: 8.5 10*3/uL — ABNORMAL HIGH (ref 1.7–7.7)
Neutrophils Relative %: 88 %
Platelet Count: 225 10*3/uL (ref 150–400)
RBC: 3.53 MIL/uL — ABNORMAL LOW (ref 3.87–5.11)
RDW: 14.7 % (ref 11.5–15.5)
WBC Count: 9.6 10*3/uL (ref 4.0–10.5)
nRBC: 0.2 % (ref 0.0–0.2)

## 2022-06-08 LAB — CMP (CANCER CENTER ONLY)
ALT: 27 U/L (ref 0–44)
AST: 19 U/L (ref 15–41)
Albumin: 4.2 g/dL (ref 3.5–5.0)
Alkaline Phosphatase: 64 U/L (ref 38–126)
Anion gap: 7 (ref 5–15)
BUN: 10 mg/dL (ref 6–20)
CO2: 27 mmol/L (ref 22–32)
Calcium: 9.6 mg/dL (ref 8.9–10.3)
Chloride: 105 mmol/L (ref 98–111)
Creatinine: 0.69 mg/dL (ref 0.44–1.00)
GFR, Estimated: >60 mL/min (ref >60)
Glucose, Bld: 154 mg/dL — ABNORMAL HIGH (ref 70–99)
Potassium: 3.5 mmol/L (ref 3.5–5.1)
Sodium: 139 mmol/L (ref 135–145)
Total Bilirubin: 0.5 mg/dL (ref 0.3–1.2)
Total Protein: 6.8 g/dL (ref 6.5–8.1)

## 2022-06-08 MED ORDER — SODIUM CHLORIDE 0.9 % IV SOLN
60.0000 mg/m2 | Freq: Once | INTRAVENOUS | Status: AC
  Administered 2022-06-08: 120 mg via INTRAVENOUS
  Filled 2022-06-08: qty 12

## 2022-06-08 MED ORDER — PALONOSETRON HCL INJECTION 0.25 MG/5ML
0.2500 mg | Freq: Once | INTRAVENOUS | Status: AC
  Administered 2022-06-08: 0.25 mg via INTRAVENOUS
  Filled 2022-06-08: qty 5

## 2022-06-08 MED ORDER — SODIUM CHLORIDE 0.9 % IV SOLN
10.0000 mg | Freq: Once | INTRAVENOUS | Status: AC
  Administered 2022-06-08: 10 mg via INTRAVENOUS
  Filled 2022-06-08: qty 10

## 2022-06-08 MED ORDER — ACETAMINOPHEN 325 MG PO TABS
650.0000 mg | ORAL_TABLET | Freq: Once | ORAL | Status: AC
  Administered 2022-06-08: 650 mg via ORAL
  Filled 2022-06-08: qty 2

## 2022-06-08 MED ORDER — SODIUM CHLORIDE 0.9% FLUSH
10.0000 mL | INTRAVENOUS | Status: DC | PRN
  Administered 2022-06-08: 10 mL

## 2022-06-08 MED ORDER — SODIUM CHLORIDE 0.9 % IV SOLN
420.0000 mg | Freq: Once | INTRAVENOUS | Status: AC
  Administered 2022-06-08: 420 mg via INTRAVENOUS
  Filled 2022-06-08: qty 14

## 2022-06-08 MED ORDER — HEPARIN SOD (PORK) LOCK FLUSH 100 UNIT/ML IV SOLN
500.0000 [IU] | Freq: Once | INTRAVENOUS | Status: AC | PRN
  Administered 2022-06-08: 500 [IU]

## 2022-06-08 MED ORDER — SODIUM CHLORIDE 0.9 % IV SOLN: Freq: Once | INTRAVENOUS | Status: AC

## 2022-06-08 MED ORDER — SODIUM CHLORIDE 0.9 % IV SOLN
886.8000 mg | Freq: Once | INTRAVENOUS | Status: AC
  Administered 2022-06-08: 890 mg via INTRAVENOUS
  Filled 2022-06-08: qty 89

## 2022-06-08 MED ORDER — DIPHENHYDRAMINE HCL 25 MG PO CAPS
50.0000 mg | ORAL_CAPSULE | Freq: Once | ORAL | Status: AC
  Administered 2022-06-08: 50 mg via ORAL
  Filled 2022-06-08: qty 2

## 2022-06-08 MED ORDER — SODIUM CHLORIDE 0.9 % IV SOLN
150.0000 mg | Freq: Once | INTRAVENOUS | Status: AC
  Administered 2022-06-08: 150 mg via INTRAVENOUS
  Filled 2022-06-08: qty 150

## 2022-06-08 MED ORDER — TRASTUZUMAB-ANNS CHEMO 150 MG IV SOLR
6.0000 mg/kg | Freq: Once | INTRAVENOUS | Status: AC
  Administered 2022-06-08: 525 mg via INTRAVENOUS
  Filled 2022-06-08: qty 25

## 2022-06-08 NOTE — Research (Signed)
DCP-001: Use of a Clinical Trial Screening Tool to Address Cancer Health Disparities in the [place] [facility])  Patient [name] [name] was identified by this RN as a potential candidate for the above listed study. This Clinical Research Nurse met with ZAMARIAH SEABORN, [id number], on 06/08/22 in a manner and location that ensures patient privacy to discuss participation in the above listed research study. Patient is Accompanied by her significant other . A copy of the informed consent document and separate HIPAA Authorization was provided to the patient. Patient reads, speaks, and understands [place].    Patient was provided with the business card of this Nurse and encouraged to contact the research team with any questions. Patient was provided the option of taking informed consent documents home to review and was encouraged to review at their convenience with their support network, including other care providers. Patient is comfortable with making a decision regarding study participation today.  As outlined in the informed consent form, this Nurse and Silvestre Gunner discussed the purpose of the research study, the investigational nature of the study, study procedures and requirements for study participation, potential risks and benefits of study participation, as well as alternatives to participation. This study is not blinded. The patient understands participation is voluntary and they may withdraw from study participation at any time.  This study does not involve randomization.  This study does not involve an investigational drug or device. This study does not involve a placebo. Patient understands enrollment is pending full eligibility review.   Confidentiality and how the patient's information will be used as part of study participation were discussed. Patient was informed there is not reimbursement provided for their time and effort spent on trial participation.  The patient is encouraged to discuss research study participation with their insurance provider to determine what costs they may incur as part of study participation, including research related injury.    All questions were answered to patient's satisfaction. The informed consent and separate HIPAA Authorization was reviewed page by page. The patient's mental and emotional status is appropriate to provide informed consent, and the patient verbalizes an understanding of study participation. Patient has agreed to participate in the above listed research study and has voluntarily signed the informed consent protocol version date 08/08/21 ([place] Active Date 02/24/2022) and and separate HIPAA Authorization, version 15 date 04/19/21 ([place] Active Date 04/27/22)  on 06/08/22 at 1015AM. The patient was provided with a copy of the signed informed consent form and separate HIPAA Authorization for their reference. No study specific procedures were obtained prior to the signing of the informed consent document. Approximately 20 minutes were spent with the patient reviewing the informed consent documents.  Patient was not requested to complete a Release of Information form.  Data was collected after consent was signed.

## 2022-06-08 NOTE — Assessment & Plan Note (Signed)
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  Given large tumor measuring almost 4 cm in longest dimension and HER2 amplification, we have discussed about neoadjuvant chemotherapy.  I have recommended neoadjuvant TCHP followed by surgery and adjuvant HER2 based therapy depending on response.  Once she completes adjuvant radiation, she will be a candidate for adjuvant antiestrogen therapy.   We have discussed adverse effects of chemotherapy including but not limited to fatigue, nausea, vomiting, diarrhea, increased risk of infections, neurotoxicity, cardiotoxicity.  She understands that the chemotherapy is every 21 days for 6 cycles followed by growth factor support with each cycle.  She understands that the growth factors cancer times cause diffuse bone pains. She is status post first cycle of chemotherapy   Toxicities Grade 2 diarrhea Grade 1 nausea Alopecia  Plan I will dose reduce chemotherapy, docetaxel 60 mg/m. For diarrhea, she will continue Imodium, Lomotil as needed, hydrate.  We will also arrange for IV fluids once a week.  She was encouraged to contact us if she needs more frequent IV fluids Nausea continue Dex as recommended, can skip the evening dose if she has mild nausea because she is having insomnia from this. Most recent MRI guided biopsy with benign findings.  We will continue to clinically monitor her left breast tumor.  Benay Pike MD

## 2022-06-08 NOTE — Patient Instructions (Signed)
[facility]COL[facility]  Discharge Instructions: Thank you for choosing [facility] to provide your oncology and hematology care.   If you have a lab appointment with the [facility], please go directly to the [facility] and check in at the registration area.   Wear comfortable clothing and clothing appropriate for easy access to any Portacath or PICC line.   We strive to give you quality time with your provider. You may need to reschedule your appointment if you arrive late (15 or more minutes).  Arriving late affects you and other patients whose appointments are after yours.  Also, if you miss three or more appointments without notifying the office, you may be dismissed from the clinic at the provider's discretion.      For prescription refill requests, have your pharmacy contact our office and allow 72 hours for refills to be completed.    Today you received the following chemotherapy and/or immunotherapy agents : Trastuzumab, pertuzumab, taxotere, carboplatin      To help prevent nausea and vomiting after your treatment, we encourage you to take your nausea medication as directed.  BELOW ARE SYMPTOMS THAT SHOULD BE REPORTED IMMEDIATELY: *FEVER GREATER THAN 100.4 F (38 C) OR HIGHER *CHILLS OR SWEATING *NAUSEA AND VOMITING THAT IS NOT CONTROLLED WITH YOUR NAUSEA MEDICATION *UNUSUAL SHORTNESS OF BREATH *UNUSUAL BRUISING OR BLEEDING *URINARY PROBLEMS (pain or burning when urinating, or frequent urination) *BOWEL PROBLEMS (unusual diarrhea, constipation, pain near the anus) TENDERNESS IN MOUTH AND THROAT WITH OR WITHOUT PRESENCE OF ULCERS (sore throat, sores in mouth, or a toothache) UNUSUAL RASH, SWELLING OR PAIN  UNUSUAL VAGINAL DISCHARGE OR ITCHING   Items with * indicate a potential emergency and should be followed up as soon as possible or go to the Emergency Department if any problems should occur.  Please show the CHEMOTHERAPY ALERT CARD or  IMMUNOTHERAPY ALERT CARD at check-in to the Emergency Department and triage nurse.  Should you have questions after your visit or need to cancel or reschedule your appointment, please contact [facility]  Dept: [phone]  and follow the prompts.  Office hours are 8:00 a.m. to 4:30 p.m. Monday - Friday. Please note that voicemails left after 4:00 p.m. may not be returned until the following business day.  We are closed weekends and major holidays. You have access to a nurse at all times for urgent questions. Please call the main number to the clinic Dept: [phone] and follow the prompts.   For any non-urgent questions, you may also contact your provider using MyChart. We now offer e-Visits for anyone 16 and older to request care online for non-urgent symptoms. For details visit [email or web address].   Also download the MyChart app! Go to the app store, search "MyChart", open the app, select [facility], and log in with your MyChart username and password.  Masks are optional in the cancer centers. If you would like for your care team to wear a mask while they are taking care of you, please let them know. You may have one support person who is at least 44 years old accompany you for your appointments.

## 2022-06-08 NOTE — Progress Notes (Signed)
46 year old female diagnosed with breast cancer receiving cycle 2 out of 7 of TCHP today.  She is followed by Dr. Chryl Heck.  Past medical history includes anemia and GERD.  Medications include Zofran, Compazine, Questran, Lomotil, and Imodium.  Labs include glucose 105 on November 16.  Height: 68 inches. Weight: 187 pounds 5 ounces. Usual body weight: 198 pounds September 1. BMI: 28.49.  Patient endorses an approximate 9 pound weight loss.  Reports diarrhea has been her biggest struggle.  She reports improved control with Lomotil and Imodium.  She has not needed Questran to this point.  Reports she loves salads and vegetables.  She does not eat a lot of whole-wheat products.  Nutrition diagnosis: Food and nutrition related knowledge deficit related to breast cancer and associated treatments as evidenced by no prior need for nutrition related information.  Intervention: Educated patient on following low fiber diet to minimize diarrhea.   Provided alternatives to salads and many cooked vegetables and provided nutrition fact sheet. Recommend increasing pectin containing fruits such as applesauce. Encouraged increased fluids. Recommended smaller more frequent meals and snacks and encouraged weight maintenance.  Monitoring, evaluation, goals: Patient will tolerate adequate calories and protein to minimize weight loss.  Next visit: Patient will contact RD with further questions or concerns.  **Disclaimer: This note was dictated with voice recognition software. Similar sounding words can inadvertently be transcribed and this note may contain transcription errors which may not have been corrected upon publication of note.**

## 2022-06-08 NOTE — Progress Notes (Signed)
[facility] Follow up:    [name], [name], DO [address] [place] [address]   DIAGNOSIS:  Cancer Staging  Malignant neoplasm of upper-outer quadrant of left breast in female, estrogen receptor positive ([place]) Staging form: Breast, AJCC 8th Edition - Clinical stage from 05/08/2022: Stage IB (cT2, cN0, cM0, G2, ER+, PR+, HER2+) - Signed by Hayden Pedro, PA-C on 05/08/2022 Stage prefix: Initial diagnosis Method of lymph node assessment: Clinical Histologic grading system: 3 grade system   SUMMARY OF ONCOLOGIC HISTORY: Oncology History  Malignant neoplasm of upper-outer quadrant of left breast in female, estrogen receptor positive ([place])  04/25/2022 Imaging   Patient had diagnostic bilateral mammogram given palpable mass in the left breast.  This showed a 2.6 x 3.5 x 3 cm irregular mass as well as possible satellite mass which is indeterminate.  An ultrasound was recommended.  Ultrasound of the left breast mass showed 3.9 x 2.4 x 2.3 cm irregular mass with an irregular and spiculated margin at 3:00 middle depth 5 cm from the nipple, irregular mass is hypoechoic.  In the left breast at 1 o'clock position, 3 cm from the nipple there is a round mass measuring 0.3 x 0.2 x 0.3 cm.  No significant abnormalities were seen sonographically in the left axilla.  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  Left breast needle core biopsy at 1:00 3 cm from the nipple showed fibrocystic changes with usual ductal hyperplasia negative for malignancy.  Prognostic showed ER 95% PR 40%, HER2 3+ KI of 50%   05/08/2022 Initial Diagnosis   Malignant neoplasm of upper-outer quadrant of left breast in female, estrogen receptor positive  ([place])   05/08/2022 Cancer Staging   Staging form: Breast, AJCC 8th Edition - Clinical stage from 05/08/2022: Stage IB (cT2, cN0, cM0, G2, ER+, PR+, HER2+) - Signed by Hayden Pedro, PA-C on 05/08/2022 Stage prefix: Initial diagnosis Method of lymph node assessment: Clinical Histologic grading system: 3 grade system   05/18/2022 -  Chemotherapy   Patient is on Treatment Plan : BREAST  Docetaxel + Carboplatin + Trastuzumab + Pertuzumab  (TCHP) q21d      05/23/2022 Genetic Testing   Negative genetics for [facility] CustomNext-Cancer +RNAinsight Panel.  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 Report date is 05/23/2022.   The CustomNext-Cancer+RNAinsight panel offered by [facility] includes sequencing and rearrangement analysis for the following 47 genes:  APC, ATM, AXIN2, BARD1, BMPR1A, BRCA1, BRCA2, BRIP1, CDH1, CDK4, CDKN2A, CHEK2, DICER1, EPCAM, GREM1, HOXB13, MEN1, MLH1, MSH2, MSH3, MSH6, MUTYH, NBN, NF1, NF2, NTHL1, PALB2, PMS2, POLD1, POLE, PTEN, RAD51C, RAD51D, RECQL, RET, SDHA, SDHAF2, SDHB, SDHC, SDHD, SMAD4, SMARCA4, STK11, TP53, TSC1, TSC2, and VHL.  RNA data is routinely analyzed for use in variant interpretation for all genes.     CURRENT THERAPY: TCHP cycle 1 day 8  INTERVAL HISTORY:  [name] [name] 46 y.o. female returns for follow-up after her first cycle of TCHP.   Since last visit, she is here for planned second cycle of TCHP.  With first cycle of TCHP, she had severe arthralgias likely from the growth factor shot, nausea but no vomiting, diarrhea.  Diarrhea was the most bothersome.  This lasted for almost 10 days, about 10  bowel movements a day.  She was given Imodium, Lomotil and Questran.  She did not have to use the Questran.  She had difficulty hydrating herself hence required IV fluids.    She is very excited that her breast lump appears smaller.  She is however worried about recent MRI guided biopsy and the resulting hematoma.  She lost all her hair after the  first cycle of chemotherapy.  Rest of the pertinent 10 point ROS reviewed and negative  Patient Active Problem List   Diagnosis Date Noted   Genetic testing 05/30/2022   Family history of breast cancer 05/10/2022   Family history of ovarian cancer 05/10/2022   Malignant neoplasm of upper-outer quadrant of left breast in female, estrogen receptor positive ([place]) 05/08/2022   Food impaction of esophagus    Schatzki's ring    Hx of migraine headaches 11/17/2013   Ureteral calculus 11/17/2013   GERD (gastroesophageal reflux disease) 11/17/2013   Esophageal obstruction due to food impaction 11/17/2013    is allergic to amoxicillin, coconut (cocos nucifera), penicillins, duloxetine, hydrocodone, hydrocodone-acetaminophen, and moxifloxacin.  MEDICAL HISTORY: Past Medical History:  Diagnosis Date   Anemia    history of   Breast cancer, left ([place])    Dyspnea    even with speaking   Family history of adverse reaction to anesthesia    mother slow to awaken   Family history of breast cancer 05/10/2022   Family history of ovarian cancer 05/10/2022   Fibromyalgia    GERD (gastroesophageal reflux disease)    History of kidney stones    last stone 6 months ago    Migraine    Seasonal allergies    Tinnitus    Vertigo     SURGICAL HISTORY: Past Surgical History:  Procedure Laterality Date   ADENOIDECTOMY  1991   CYSTOSCOPY WITH RETROGRADE PYELOGRAM, URETEROSCOPY AND STENT PLACEMENT Left 02/22/2021   Procedure: [place], URETEROSCOPY AND STENT PLACEMENT;  Surgeon: Remi Haggard, MD;  Location: [facility];  Service: Urology;  Laterality: Left;   CYSTOSCOPY/URETEROSCOPY/HOLMIUM LASER/STENT PLACEMENT Left 03/10/2021   Procedure: CYSTOSCOPY RETROGRADE, LEFT URETEROSCOPY/ STONE BASKETRY [place] LASER/STENT EXCHANGE;  Surgeon: Ardis Hughs, MD;  Location: [facility];  Service: Urology;  Laterality: Left;   ESOPHAGOGASTRODUODENOSCOPY N/A  11/17/2013   Procedure: ESOPHAGOGASTRODUODENOSCOPY (EGD);  Surgeon: Milus Banister, MD;  Location: [place] [place];  Service: Endoscopy;  Laterality: N/A;   ESOPHAGOGASTRODUODENOSCOPY N/A 10/15/2015   Procedure: ESOPHAGOGASTRODUODENOSCOPY (EGD);  Surgeon: Gatha Mayer, MD;  [facility];  Service: Endoscopy;  Laterality: N/A;   HOLMIUM LASER APPLICATION  07/12/7515   Procedure: HOLMIUM LASER APPLICATION;  Surgeon: Ardis Hughs, MD;  Location: [facility];  Service: Urology;;   NASAL SEPTOPLASTY W/ TURBINOPLASTY Bilateral 03/18/2018   Procedure: BILATERAL NASAL SEPTOPLASTY WITH TURBINATE REDUCTION;  Surgeon: Jodi Marble, MD;  Location: [place];  Service: ENT;  Laterality: Bilateral;   PORTACATH PLACEMENT Right 05/17/2022   Procedure: PORT-A-CATH INSERTION WITH ULTRASOUND GUIDANCE;  Surgeon: [name], MD;  Location: [facility];  Service: General;  Laterality: Right;   TONSILLECTOMY  1991   TUBAL LIGATION  2005    SOCIAL HISTORY: Social History   Socioeconomic History   Marital status: Single    Spouse name: Not on file   Number of children: Not on file   Years of education: Not on file   Highest education level: Not on file  Occupational History   Not on file  Tobacco  Use   Smoking status: Never   Smokeless tobacco: Never  Vaping Use   Vaping Use: Never used  Substance and Sexual Activity   Alcohol use: No   Drug use: No   Sexual activity: Not on file  Other Topics Concern   Not on file  Social History Narrative   ** Merged History Encounter **       Social Determinants of Health   Financial Resource Strain: Low Risk  (05/10/2022)   Overall Financial Resource Strain (CARDIA)    Difficulty of Paying Living Expenses: Not very hard  Food Insecurity: No Food Insecurity (05/10/2022)   Hunger Vital Sign    Worried About Running Out of Food in the Last Year: Never true    Ran Out of Food in the Last Year: Never true   Transportation Needs: No Transportation Needs (05/10/2022)   PRAPARE - Hydrologist (Medical): No    Lack of Transportation (Non-Medical): No  Physical Activity: Not on file  Stress: Not on file  Social Connections: Not on file  Intimate Partner Violence: Not on file    FAMILY HISTORY: Family History  Problem Relation Age of Onset   Asthma Mother    Diabetes Mother    Colon polyps Mother    Ovarian cancer Maternal Grandmother 79   Diabetes Maternal Grandmother    Esophageal cancer Maternal Grandfather        d. 68   Breast cancer Other        MGM's sisters x2   Cervical cancer Cousin        mat cousin; dx <40    Review of Systems  Constitutional:  Negative for appetite change, chills, fatigue, fever and unexpected weight change.  HENT:   Negative for hearing loss, lump/mass and trouble swallowing.   Eyes:  Negative for eye problems and icterus.  Respiratory:  Negative for chest tightness, cough and shortness of breath.   Cardiovascular:  Negative for chest pain, leg swelling and palpitations.  Gastrointestinal:  Positive for diarrhea. Negative for abdominal distention, abdominal pain, constipation, nausea and vomiting.  Endocrine: Negative for hot flashes.  Genitourinary:  Negative for difficulty urinating.   Musculoskeletal:  Negative for arthralgias.  Skin:  Negative for itching and rash.  Neurological:  Negative for dizziness, extremity weakness, headaches and numbness.  Hematological:  Negative for adenopathy. Does not bruise/bleed easily.  Psychiatric/Behavioral:  Negative for depression. The patient is not nervous/anxious.       PHYSICAL EXAMINATION  ECOG PERFORMANCE STATUS: 1 - Symptomatic but completely ambulatory  Vitals:   06/08/22 0908  BP: 129/74  Pulse: 63  Resp: 18  Temp: 97.9 F (36.6 C)  SpO2: 100%    Physical Exam Constitutional:      General: She is not in acute distress.    Appearance: Normal appearance. She  is not toxic-appearing.  HENT:     Head: Normocephalic and atraumatic.  Eyes:     General: No scleral icterus. Cardiovascular:     Rate and Rhythm: Normal rate and regular rhythm.     Pulses: Normal pulses.     Heart sounds: Normal heart sounds.  Pulmonary:     Effort: Pulmonary effort is normal.     Breath sounds: Normal breath sounds.  Chest:     Comments: Left breast upper outer quadrant mass appears slightly smaller measuring around 4 cm in largest dimension and 2 to 3 cm and smaller dimension.  The bottom portion of the left  breast is bruised from recent biopsy, resulting hematoma.  No other concerns. Abdominal:     General: Abdomen is flat. Bowel sounds are normal. There is no distension.     Palpations: Abdomen is soft.     Tenderness: There is no abdominal tenderness.  Musculoskeletal:        General: No swelling.     Cervical back: Neck supple.  Lymphadenopathy:     Cervical: No cervical adenopathy.  Skin:    General: Skin is warm and dry.     Findings: No rash.  Neurological:     General: No focal deficit present.     Mental Status: She is alert.  Psychiatric:        Mood and Affect: Mood normal.        Behavior: Behavior normal.     LABORATORY DATA:  CBC    Component Value Date/Time   WBC 9.6 06/08/2022 0849   WBC 8.2 02/21/2021 1833   RBC 3.53 (L) 06/08/2022 0849   HGB 11.0 (L) 06/08/2022 0849   HCT 31.8 (L) 06/08/2022 0849   PLT 225 06/08/2022 0849   MCV 90.1 06/08/2022 0849   MCH 31.2 06/08/2022 0849   MCHC 34.6 06/08/2022 0849   RDW 14.7 06/08/2022 0849   LYMPHSABS 0.9 06/08/2022 0849   MONOABS 0.2 06/08/2022 0849   EOSABS 0.0 06/08/2022 0849   BASOSABS 0.0 06/08/2022 0849    CMP     Component Value Date/Time   NA 139 06/08/2022 0849   K 3.5 06/08/2022 0849   CL 105 06/08/2022 0849   CO2 27 06/08/2022 0849   GLUCOSE 154 (H) 06/08/2022 0849   BUN 10 06/08/2022 0849   CREATININE 0.69 06/08/2022 0849   CALCIUM 9.6 06/08/2022 0849   PROT  6.8 06/08/2022 0849   ALBUMIN 4.2 06/08/2022 0849   AST 19 06/08/2022 0849   ALT 27 06/08/2022 0849   ALKPHOS 64 06/08/2022 0849   BILITOT 0.5 06/08/2022 0849   GFRNONAA >60 06/08/2022 0849   GFRAA >90 11/04/2014 1100     ASSESSMENT and THERAPY PLAN:   Malignant neoplasm of upper-outer quadrant of left breast in female, estrogen receptor positive ([place]) This is a 46 year old premenopausal female patient with newly diagnosed T2 N0 M0 ER/PR and HER2 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  Given large tumor measuring almost 4 cm in longest dimension and HER2 amplification, we have discussed about neoadjuvant chemotherapy.  I have recommended neoadjuvant TCHP followed by surgery and adjuvant HER2 based therapy depending on response.  Once she completes adjuvant radiation, she will be a candidate for adjuvant antiestrogen therapy.   We have discussed adverse effects of chemotherapy including but not limited to fatigue, nausea, vomiting, diarrhea, increased risk of infections, neurotoxicity, cardiotoxicity.  She understands that the chemotherapy is every 21 days for 6 cycles followed by growth factor support with each cycle.  She understands that the growth factors cancer times cause diffuse bone pains. She is status post first cycle of chemotherapy   Toxicities Grade 2 diarrhea Grade 1 nausea Alopecia  Plan I will dose reduce chemotherapy, docetaxel 60 mg/m. For diarrhea, she will continue Imodium, Lomotil as needed, hydrate.  We will also arrange for IV fluids once a week.  She was encouraged to contact us if she needs more frequent IV fluids Nausea continue Dex as recommended, can skip the evening dose if she has mild nausea because she is having insomnia from this. Most  recent MRI guided biopsy with benign findings.  We will continue to clinically monitor her left breast tumor.  Benay Pike MD   All questions were  answered. The patient knows to call the clinic with any problems, questions or concerns. We can certainly see the patient much sooner if necessary.  Total encounter time:30 minutes*in face-to-face visit time, chart review, lab review, care coordination, order entry, and documentation of the encounter time.  *Total Encounter Time as defined by the Centers for Medicare and Medicaid Services includes, in addition to the face-to-face time of a patient visit (documented in the note above) non-face-to-face time: obtaining and reviewing outside history, ordering and reviewing medications, tests or procedures, care coordination (communications with other health care professionals or caregivers) and documentation in the medical record.

## 2022-06-09 ENCOUNTER — Other Ambulatory Visit: Payer: Self-pay

## 2022-06-10 ENCOUNTER — Inpatient Hospital Stay: Payer: 59 | Attending: Hematology and Oncology

## 2022-06-10 VITALS — BP 125/85 | HR 82 | Temp 97.8°F | Resp 18

## 2022-06-10 DIAGNOSIS — Z5111 Encounter for antineoplastic chemotherapy: Secondary | ICD-10-CM | POA: Diagnosis not present

## 2022-06-10 DIAGNOSIS — D649 Anemia, unspecified: Secondary | ICD-10-CM | POA: Insufficient documentation

## 2022-06-10 DIAGNOSIS — E876 Hypokalemia: Secondary | ICD-10-CM | POA: Diagnosis not present

## 2022-06-10 DIAGNOSIS — Z5112 Encounter for antineoplastic immunotherapy: Secondary | ICD-10-CM | POA: Insufficient documentation

## 2022-06-10 DIAGNOSIS — C50412 Malignant neoplasm of upper-outer quadrant of left female breast: Secondary | ICD-10-CM | POA: Diagnosis not present

## 2022-06-10 DIAGNOSIS — Z803 Family history of malignant neoplasm of breast: Secondary | ICD-10-CM | POA: Insufficient documentation

## 2022-06-10 DIAGNOSIS — E860 Dehydration: Secondary | ICD-10-CM | POA: Diagnosis not present

## 2022-06-10 DIAGNOSIS — Z170 Estrogen receptor positive status [ER+]: Secondary | ICD-10-CM | POA: Insufficient documentation

## 2022-06-10 DIAGNOSIS — Z5189 Encounter for other specified aftercare: Secondary | ICD-10-CM | POA: Insufficient documentation

## 2022-06-10 DIAGNOSIS — D696 Thrombocytopenia, unspecified: Secondary | ICD-10-CM | POA: Diagnosis not present

## 2022-06-10 DIAGNOSIS — D6959 Other secondary thrombocytopenia: Secondary | ICD-10-CM | POA: Diagnosis not present

## 2022-06-10 DIAGNOSIS — T451X5A Adverse effect of antineoplastic and immunosuppressive drugs, initial encounter: Secondary | ICD-10-CM | POA: Diagnosis not present

## 2022-06-10 DIAGNOSIS — Z800 Family history of malignant neoplasm of digestive organs: Secondary | ICD-10-CM | POA: Insufficient documentation

## 2022-06-10 DIAGNOSIS — D708 Other neutropenia: Secondary | ICD-10-CM | POA: Diagnosis not present

## 2022-06-10 DIAGNOSIS — Z8049 Family history of malignant neoplasm of other genital organs: Secondary | ICD-10-CM | POA: Diagnosis not present

## 2022-06-10 DIAGNOSIS — R197 Diarrhea, unspecified: Secondary | ICD-10-CM | POA: Diagnosis not present

## 2022-06-10 DIAGNOSIS — N179 Acute kidney failure, unspecified: Secondary | ICD-10-CM | POA: Insufficient documentation

## 2022-06-10 DIAGNOSIS — Z8041 Family history of malignant neoplasm of ovary: Secondary | ICD-10-CM | POA: Diagnosis not present

## 2022-06-10 MED ORDER — PEGFILGRASTIM-JMDB 6 MG/0.6ML ~~LOC~~ SOSY
6.0000 mg | PREFILLED_SYRINGE | Freq: Once | SUBCUTANEOUS | Status: AC
  Administered 2022-06-10: 6 mg via SUBCUTANEOUS
  Filled 2022-06-10: qty 0.6

## 2022-06-10 NOTE — Patient Instructions (Signed)

## 2022-06-14 ENCOUNTER — Telehealth: Payer: Self-pay | Admitting: *Deleted

## 2022-06-14 ENCOUNTER — Other Ambulatory Visit: Payer: Self-pay

## 2022-06-14 DIAGNOSIS — Z170 Estrogen receptor positive status [ER+]: Secondary | ICD-10-CM

## 2022-06-14 NOTE — Progress Notes (Signed)
Lab orders entered for [facility]

## 2022-06-14 NOTE — Progress Notes (Signed)
Symptom Management Consult note [place]    Patient Care Team: [place], Waldemar Dickens, DO as PCP - General (Family Medicine) [name], MD as Consulting Physician (General Surgery) Benay Pike, MD as Consulting Physician (Hematology and Oncology) Kyung Rudd, MD as Consulting Physician (Radiation Oncology) Rockwell Germany, RN as Oncology Nurse Navigator Mauro Kaufmann, RN as Oncology Nurse Navigator    Name of the patient: [name] [name]  [id number]  [birth year]   Date of visit: 06/15/2022   Chief Complaint/Reason for visit: fatigue, nausea   Current Therapy: TCHP with pegfilgrastim  Last treatment:  Day 1   Cycle 2 on 06/08/22    ASSESSMENT & PLAN: Patient is a 46 y.o. female  with oncologic history of malignant neoplasm of upper-outer quadrant of left breast, ER positive followed by Dr. Chryl Heck.  I have viewed most recent oncology note and lab work.    #) malignant neoplasm of upper-outer quadrant of left breast, ER positive -Per last oncology note docetaxel was dose reduced 2/2 side effects.  -Next appointment with oncologist is 06/29/22   #)       Heme/Onc History: Oncology History  Malignant neoplasm of upper-outer quadrant of left breast in female, estrogen receptor positive ([place])  04/25/2022 Imaging   Patient had diagnostic bilateral mammogram given palpable mass in the left breast.  This showed a 2.6 x 3.5 x 3 cm irregular mass as well as possible satellite mass which is indeterminate.  An ultrasound was recommended.  Ultrasound of the left breast mass showed 3.9 x 2.4 x 2.3 cm irregular mass with an irregular and spiculated margin at 3:00 middle depth 5 cm from the nipple, irregular mass is hypoechoic.  In the left breast at 1 o'clock position, 3 cm from the nipple there is a round mass measuring 0.3 x 0.2 x 0.3 cm.  No significant abnormalities were seen sonographically in the left axilla.  There is another heterogeneous shadowing  present at 3 o'clock position 3 cm from the nipple measuring 8 x 8 x 11 mm possible correlate for mammographic finding   05/02/2022 Pathology Results   Left breast needle core biopsy showed invasive ductal carcinoma, grade 2, DCIS with necrosis.  Left breast needle core biopsy at 1:00 3 cm from the nipple showed fibrocystic changes with usual ductal hyperplasia negative for malignancy.  Prognostic showed ER 95% PR 40%, HER2 3+ KI of 50%   05/08/2022 Initial Diagnosis   Malignant neoplasm of upper-outer quadrant of left breast in female, estrogen receptor positive ([place])   05/08/2022 Cancer Staging   Staging form: Breast, AJCC 8th Edition - Clinical stage from 05/08/2022: Stage IB (cT2, cN0, cM0, G2, ER+, PR+, HER2+) - Signed by Hayden Pedro, PA-C on 05/08/2022 Stage prefix: Initial diagnosis Method of lymph node assessment: Clinical Histologic grading system: 3 grade system   05/18/2022 -  Chemotherapy   Patient is on Treatment Plan : BREAST  Docetaxel + Carboplatin + Trastuzumab + Pertuzumab  (TCHP) q21d      05/23/2022 Genetic Testing   Negative genetics for [facility] CustomNext-Cancer +RNAinsight Panel.  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 Report date is 05/23/2022.   The CustomNext-Cancer+RNAinsight panel offered by [facility] includes sequencing and rearrangement analysis for the following 47 genes:  APC, ATM, AXIN2, BARD1, BMPR1A, BRCA1, BRCA2, BRIP1, CDH1, CDK4, CDKN2A, CHEK2, DICER1, EPCAM, GREM1, HOXB13, MEN1, MLH1, MSH2, MSH3, MSH6, MUTYH, NBN, NF1, NF2, NTHL1, PALB2, PMS2, POLD1, POLE, PTEN, RAD51C, RAD51D, RECQL, RET, SDHA, SDHAF2,  SDHB, SDHC, SDHD, SMAD4, SMARCA4, STK11, TP53, TSC1, TSC2, and VHL.  RNA data is routinely analyzed for use in variant interpretation for all genes.       Interval history-: [name] [name] is a 46 y.o. female with oncologic history as above presenting to [facility] today with chief complaint of      ROS  All other systems are reviewed and  are negative for acute change except as noted in the HPI.    Allergies  Allergen Reactions   Amoxicillin Shortness Of Breath    rash   Coconut (Cocos Nucifera) Anaphylaxis   Penicillins Shortness Of Breath and Rash    Has patient had a PCN reaction causing immediate rash, facial/tongue/throat swelling, SOB or lightheadedness with hypotension: yes Has patient had a PCN reaction causing severe rash involving mucus membranes or skin necrosis: no Has patient had a PCN reaction that required hospitalization no Has patient had a PCN reaction occurring within the last 10 years: no If all of the above answers are "NO", then may proceed with Cephalosporin use.    Duloxetine Other (See Comments)    Caused suicidal thoughts   Hydrocodone Itching   Hydrocodone-Acetaminophen Rash   Moxifloxacin Rash     Past Medical History:  Diagnosis Date   Anemia    history of   Breast cancer, left ([place])    Dyspnea    even with speaking   Family history of adverse reaction to anesthesia    mother slow to awaken   Family history of breast cancer 05/10/2022   Family history of ovarian cancer 05/10/2022   Fibromyalgia    GERD (gastroesophageal reflux disease)    History of kidney stones    last stone 6 months ago    Migraine    Seasonal allergies    Tinnitus    Vertigo      Past Surgical History:  Procedure Laterality Date   ADENOIDECTOMY  1991   CYSTOSCOPY WITH RETROGRADE PYELOGRAM, URETEROSCOPY AND STENT PLACEMENT Left 02/22/2021   Procedure: [place], URETEROSCOPY AND STENT PLACEMENT;  Surgeon: Remi Haggard, MD;  Location: [facility];  Service: Urology;  Laterality: Left;   CYSTOSCOPY/URETEROSCOPY/HOLMIUM LASER/STENT PLACEMENT Left 03/10/2021   Procedure: CYSTOSCOPY RETROGRADE, LEFT URETEROSCOPY/ STONE BASKETRY [place] LASER/STENT EXCHANGE;  Surgeon: Ardis Hughs, MD;  Location: [facility];  Service: Urology;  Laterality: Left;    ESOPHAGOGASTRODUODENOSCOPY N/A 11/17/2013   Procedure: ESOPHAGOGASTRODUODENOSCOPY (EGD);  Surgeon: Milus Banister, MD;  Location: [place];  Service: Endoscopy;  Laterality: N/A;   ESOPHAGOGASTRODUODENOSCOPY N/A 10/15/2015   Procedure: ESOPHAGOGASTRODUODENOSCOPY (EGD);  Surgeon: Gatha Mayer, MD;  [facility];  Service: Endoscopy;  Laterality: N/A;   HOLMIUM LASER APPLICATION  07/15/1094   Procedure: HOLMIUM LASER APPLICATION;  Surgeon: Ardis Hughs, MD;  Location: [facility];  Service: Urology;;   NASAL SEPTOPLASTY W/ TURBINOPLASTY Bilateral 03/18/2018   Procedure: BILATERAL NASAL SEPTOPLASTY WITH TURBINATE REDUCTION;  Surgeon: Jodi Marble, MD;  Location: [place];  Service: ENT;  Laterality: Bilateral;   PORTACATH PLACEMENT Right 05/17/2022   Procedure: PORT-A-CATH INSERTION WITH ULTRASOUND GUIDANCE;  Surgeon: [name], MD;  Location: [facility];  Service: General;  Laterality: Right;   TONSILLECTOMY  1991   TUBAL LIGATION  2005    Social History   Socioeconomic History   Marital status: Single    Spouse name: Not on file   Number of children: Not on file   Years of education: Not  on file   Highest education level: Not on file  Occupational History   Not on file  Tobacco Use   Smoking status: Never   Smokeless tobacco: Never  Vaping Use   Vaping Use: Never used  Substance and Sexual Activity   Alcohol use: No   Drug use: No   Sexual activity: Not on file  Other Topics Concern   Not on file  Social History Narrative   ** Merged History Encounter **       Social Determinants of Health   Financial Resource Strain: Low Risk  (05/10/2022)   Overall Financial Resource Strain (CARDIA)    Difficulty of Paying Living Expenses: Not very hard  Food Insecurity: No Food Insecurity (05/10/2022)   Hunger Vital Sign    Worried About Running Out of Food in the Last Year: Never true    Ran Out of Food in the Last Year: Never  true  Transportation Needs: No Transportation Needs (05/10/2022)   PRAPARE - Hydrologist (Medical): No    Lack of Transportation (Non-Medical): No  Physical Activity: Not on file  Stress: Not on file  Social Connections: Not on file  Intimate Partner Violence: Not on file    Family History  Problem Relation Age of Onset   Asthma Mother    Diabetes Mother    Colon polyps Mother    Ovarian cancer Maternal Grandmother 32   Diabetes Maternal Grandmother    Esophageal cancer Maternal Grandfather        d. 71   Breast cancer Other        MGM's sisters x2   Cervical cancer Cousin        mat cousin; dx <40     Current Outpatient Medications:    acetaminophen (TYLENOL) 500 MG tablet, Take 1,000 mg by mouth every 8 (eight) hours as needed for headache., Disp: , Rfl:    cholestyramine (QUESTRAN) 4 g packet, Take 1 packet (4 g total) by mouth 2 (two) times daily., Disp: 60 each, Rfl: 0   dexamethasone (DECADRON) 4 MG tablet, Take 2 tabs by mouth 2 times daily starting day before chemo. Then take 2 tabs daily for 2 days starting day after chemo. Take with food., Disp: 30 tablet, Rfl: 1   diphenoxylate-atropine (LOMOTIL) 2.5-0.025 MG tablet, Take 1 tablet by mouth 4 (four) times daily as needed for diarrhea or loose stools., Disp: 30 tablet, Rfl: 0   lidocaine-prilocaine (EMLA) cream, Apply to affected area once, Disp: 30 g, Rfl: 3   ondansetron (ZOFRAN) 8 MG tablet, Take 1 tablet (8 mg total) by mouth every 8 (eight) hours as needed for nausea or vomiting. Start on the third day after chemotherapy., Disp: 30 tablet, Rfl: 1   prochlorperazine (COMPAZINE) 10 MG tablet, Take 1 tablet (10 mg total) by mouth every 6 (six) hours as needed for nausea or vomiting., Disp: 30 tablet, Rfl: 1   traMADol (ULTRAM) 50 MG tablet, Take 1 tablet (50 mg total) by mouth every 6 (six) hours as needed for moderate pain or severe pain., Disp: 25 tablet, Rfl: 0  PHYSICAL EXAM: ECOG  FS:{CHL ONC TI:[id number]}   There were no vitals filed for this visit. Physical Exam     LABORATORY DATA: I have reviewed the data as listed    Latest Ref Rng & Units 06/08/2022    8:49 AM 05/25/2022    2:34 PM 05/17/2022   10:36 AM  CBC  WBC 4.0 - 10.5  K/uL 9.6  5.3  4.0   Hemoglobin 12.0 - 15.0 g/dL 11.0  12.4  13.4   Hematocrit 36.0 - 46.0 % 31.8  34.3  37.9   Platelets 150 - 400 K/uL 225  87  161         Latest Ref Rng & Units 06/08/2022    8:49 AM 05/25/2022    2:34 PM 05/17/2022   10:36 AM  CMP  Glucose 70 - 99 mg/dL 154  105  98   BUN 6 - 20 mg/dL _0 Creatinine 0.44 - 1.00 mg/dL 0.69  0.86  0.82   Sodium 135 - 145 mmol/L 139  136  140   Potassium 3.5 - 5.1 mmol/L 3.5  3.7  3.5   Chloride 98 - 111 mmol/L 105  99  105   CO2 22 - 32 mmol/L _1 Calcium 8.9 - 10.3 mg/dL 9.6  9.5  9.2   Total Protein 6.5 - 8.1 g/dL 6.8  7.1  6.7   Total Bilirubin 0.3 - 1.2 mg/dL 0.5  1.5  0.8   Alkaline Phos 38 - 126 U/L 64  84  52   AST 15 - 41 U/L 19  43  10   ALT 0 - 44 U/L 27  67  9        RADIOGRAPHIC STUDIES (from last 24 hours if applicable) I have personally reviewed the radiological images as listed and agreed with the findings in the report. No results found.      Visit Diagnosis: No diagnosis found.   No orders of the defined types were placed in this encounter.   All questions were answered. The patient knows to call the clinic with any problems, questions or concerns. No barriers to learning was detected.  I have spent a total of *** minutes minutes of face-to-face and non-face-to-face time, preparing to see the patient, obtaining and/or reviewing separately obtained history, performing a medically appropriate examination, counseling and educating the patient, ordering tests, documenting clinical information in the electronic health record, and care coordination (communications with other health care professionals or caregivers).    Thank  you for allowing me to participate in the care of this patient.    Barrie Folk, PA-C Department of Hematology/Oncology [facility] Phone: [phone]  [phone]    06/14/2022 4:43 PM

## 2022-06-14 NOTE — Telephone Encounter (Signed)
She had infusion last Thursday and udenyca inj on Saturday. She called with multiple symptoms that started Tuesday: Heart racing; Occasional nose bleeds; Extreme fatigue - walks 10 steps and needs to sit; Nausea persists despite alternating compazine and zofran - no vomiting or diarrhea - eating and drinking.   Dr. Chryl Heck informed -  stated patient could have labs and be seen in [facility] tomorrow if she wasn't feeling any better. Patient has appt in [place] tomorrow for IVF.  Contacted patient, she would like to schedule appt to be seen tomorrow, saying she's felt bad since yesterday and she'd just like to be evaluated.  Scheduled by [facility] for labs and evaluation - patient aware of appt times

## 2022-06-15 ENCOUNTER — Inpatient Hospital Stay: Payer: 59

## 2022-06-15 ENCOUNTER — Other Ambulatory Visit: Payer: Self-pay

## 2022-06-15 ENCOUNTER — Inpatient Hospital Stay (HOSPITAL_BASED_OUTPATIENT_CLINIC_OR_DEPARTMENT_OTHER): Payer: 59 | Admitting: Physician Assistant

## 2022-06-15 VITALS — BP 115/85 | HR 84 | Temp 98.4°F | Resp 18 | Wt 178.5 lb

## 2022-06-15 DIAGNOSIS — E860 Dehydration: Secondary | ICD-10-CM | POA: Diagnosis not present

## 2022-06-15 DIAGNOSIS — T451X5A Adverse effect of antineoplastic and immunosuppressive drugs, initial encounter: Secondary | ICD-10-CM | POA: Diagnosis not present

## 2022-06-15 DIAGNOSIS — Z5112 Encounter for antineoplastic immunotherapy: Secondary | ICD-10-CM | POA: Diagnosis not present

## 2022-06-15 DIAGNOSIS — Z5111 Encounter for antineoplastic chemotherapy: Secondary | ICD-10-CM | POA: Diagnosis not present

## 2022-06-15 DIAGNOSIS — E876 Hypokalemia: Secondary | ICD-10-CM

## 2022-06-15 DIAGNOSIS — Z170 Estrogen receptor positive status [ER+]: Secondary | ICD-10-CM | POA: Diagnosis not present

## 2022-06-15 DIAGNOSIS — R110 Nausea: Secondary | ICD-10-CM | POA: Diagnosis not present

## 2022-06-15 DIAGNOSIS — D702 Other drug-induced agranulocytosis: Secondary | ICD-10-CM | POA: Diagnosis not present

## 2022-06-15 DIAGNOSIS — C50412 Malignant neoplasm of upper-outer quadrant of left female breast: Secondary | ICD-10-CM

## 2022-06-15 DIAGNOSIS — Z8041 Family history of malignant neoplasm of ovary: Secondary | ICD-10-CM | POA: Diagnosis not present

## 2022-06-15 DIAGNOSIS — N179 Acute kidney failure, unspecified: Secondary | ICD-10-CM | POA: Diagnosis not present

## 2022-06-15 DIAGNOSIS — D708 Other neutropenia: Secondary | ICD-10-CM | POA: Diagnosis not present

## 2022-06-15 DIAGNOSIS — D6959 Other secondary thrombocytopenia: Secondary | ICD-10-CM | POA: Diagnosis not present

## 2022-06-15 DIAGNOSIS — D649 Anemia, unspecified: Secondary | ICD-10-CM | POA: Diagnosis not present

## 2022-06-15 DIAGNOSIS — R197 Diarrhea, unspecified: Secondary | ICD-10-CM | POA: Diagnosis not present

## 2022-06-15 DIAGNOSIS — D696 Thrombocytopenia, unspecified: Secondary | ICD-10-CM | POA: Diagnosis not present

## 2022-06-15 DIAGNOSIS — Z5189 Encounter for other specified aftercare: Secondary | ICD-10-CM | POA: Diagnosis not present

## 2022-06-15 DIAGNOSIS — Z800 Family history of malignant neoplasm of digestive organs: Secondary | ICD-10-CM | POA: Diagnosis not present

## 2022-06-15 DIAGNOSIS — Z8049 Family history of malignant neoplasm of other genital organs: Secondary | ICD-10-CM | POA: Diagnosis not present

## 2022-06-15 DIAGNOSIS — K521 Toxic gastroenteritis and colitis: Secondary | ICD-10-CM | POA: Diagnosis not present

## 2022-06-15 DIAGNOSIS — Z803 Family history of malignant neoplasm of breast: Secondary | ICD-10-CM | POA: Diagnosis not present

## 2022-06-15 LAB — CBC WITH DIFFERENTIAL (CANCER CENTER ONLY)
Abs Immature Granulocytes: 0.01 10*3/uL (ref 0.00–0.07)
Basophils Absolute: 0 10*3/uL (ref 0.0–0.1)
Basophils Relative: 1 %
Eosinophils Absolute: 0 10*3/uL (ref 0.0–0.5)
Eosinophils Relative: 1 %
HCT: 34.2 % — ABNORMAL LOW (ref 36.0–46.0)
Hemoglobin: 12.5 g/dL (ref 12.0–15.0)
Immature Granulocytes: 1 %
Lymphocytes Relative: 42 %
Lymphs Abs: 0.9 10*3/uL (ref 0.7–4.0)
MCH: 31.6 pg (ref 26.0–34.0)
MCHC: 36.5 g/dL — ABNORMAL HIGH (ref 30.0–36.0)
MCV: 86.4 fL (ref 80.0–100.0)
Monocytes Absolute: 0.4 10*3/uL (ref 0.1–1.0)
Monocytes Relative: 19 %
Neutro Abs: 0.8 10*3/uL — ABNORMAL LOW (ref 1.7–7.7)
Neutrophils Relative %: 36 %
Platelet Count: 90 10*3/uL — ABNORMAL LOW (ref 150–400)
RBC: 3.96 MIL/uL (ref 3.87–5.11)
RDW: 14.2 % (ref 11.5–15.5)
WBC Count: 2.1 10*3/uL — ABNORMAL LOW (ref 4.0–10.5)
nRBC: 0 % (ref 0.0–0.2)

## 2022-06-15 LAB — CMP (CANCER CENTER ONLY)
ALT: 51 U/L — ABNORMAL HIGH (ref 0–44)
AST: 29 U/L (ref 15–41)
Albumin: 4.4 g/dL (ref 3.5–5.0)
Alkaline Phosphatase: 88 U/L (ref 38–126)
Anion gap: 9 (ref 5–15)
BUN: 27 mg/dL — ABNORMAL HIGH (ref 6–20)
CO2: 31 mmol/L (ref 22–32)
Calcium: 10.2 mg/dL (ref 8.9–10.3)
Chloride: 97 mmol/L — ABNORMAL LOW (ref 98–111)
Creatinine: 1.8 mg/dL — ABNORMAL HIGH (ref 0.44–1.00)
GFR, Estimated: 35 mL/min — ABNORMAL LOW (ref >60)
Glucose, Bld: 126 mg/dL — ABNORMAL HIGH (ref 70–99)
Potassium: 3.1 mmol/L — ABNORMAL LOW (ref 3.5–5.1)
Sodium: 137 mmol/L (ref 135–145)
Total Bilirubin: 1.1 mg/dL (ref 0.3–1.2)
Total Protein: 7.4 g/dL (ref 6.5–8.1)

## 2022-06-15 LAB — SAMPLE TO BLOOD BANK

## 2022-06-15 LAB — MAGNESIUM: Magnesium: 1.6 mg/dL — ABNORMAL LOW (ref 1.7–2.4)

## 2022-06-15 MED ORDER — ONDANSETRON HCL 4 MG/2ML IJ SOLN
4.0000 mg | Freq: Once | INTRAMUSCULAR | Status: AC
  Administered 2022-06-15: 4 mg via INTRAVENOUS
  Filled 2022-06-15: qty 2

## 2022-06-15 MED ORDER — POTASSIUM CHLORIDE CRYS ER 20 MEQ PO TBCR: 20.0000 meq | EXTENDED_RELEASE_TABLET | Freq: Two times a day (BID) | ORAL | 0 refills | Status: DC

## 2022-06-15 MED ORDER — FAMOTIDINE IN NACL 20-0.9 MG/50ML-% IV SOLN
20.0000 mg | Freq: Once | INTRAVENOUS | Status: AC
  Administered 2022-06-15: 20 mg via INTRAVENOUS
  Filled 2022-06-15: qty 50

## 2022-06-15 MED ORDER — SODIUM CHLORIDE 0.9 % IV SOLN: Freq: Once | INTRAVENOUS | Status: AC

## 2022-06-15 NOTE — Progress Notes (Signed)
Orders for zofran/pepcid entered for Saturday's infusion visit per verbal order Neysa Hotter, PA-C.

## 2022-06-15 NOTE — Patient Instructions (Signed)

## 2022-06-16 ENCOUNTER — Other Ambulatory Visit: Payer: Self-pay

## 2022-06-16 ENCOUNTER — Inpatient Hospital Stay (HOSPITAL_BASED_OUTPATIENT_CLINIC_OR_DEPARTMENT_OTHER): Payer: 59 | Admitting: Physician Assistant

## 2022-06-16 ENCOUNTER — Inpatient Hospital Stay: Payer: 59

## 2022-06-16 VITALS — BP 131/84 | HR 84 | Temp 98.2°F | Resp 16 | Wt 180.3 lb

## 2022-06-16 DIAGNOSIS — N179 Acute kidney failure, unspecified: Secondary | ICD-10-CM | POA: Diagnosis not present

## 2022-06-16 DIAGNOSIS — T451X5A Adverse effect of antineoplastic and immunosuppressive drugs, initial encounter: Secondary | ICD-10-CM

## 2022-06-16 DIAGNOSIS — E876 Hypokalemia: Secondary | ICD-10-CM

## 2022-06-16 DIAGNOSIS — D649 Anemia, unspecified: Secondary | ICD-10-CM | POA: Diagnosis not present

## 2022-06-16 DIAGNOSIS — R110 Nausea: Secondary | ICD-10-CM

## 2022-06-16 DIAGNOSIS — Z170 Estrogen receptor positive status [ER+]: Secondary | ICD-10-CM

## 2022-06-16 DIAGNOSIS — K521 Toxic gastroenteritis and colitis: Secondary | ICD-10-CM

## 2022-06-16 DIAGNOSIS — D708 Other neutropenia: Secondary | ICD-10-CM | POA: Diagnosis not present

## 2022-06-16 DIAGNOSIS — Z95828 Presence of other vascular implants and grafts: Secondary | ICD-10-CM | POA: Diagnosis not present

## 2022-06-16 DIAGNOSIS — D6959 Other secondary thrombocytopenia: Secondary | ICD-10-CM | POA: Diagnosis not present

## 2022-06-16 DIAGNOSIS — E860 Dehydration: Secondary | ICD-10-CM | POA: Diagnosis not present

## 2022-06-16 DIAGNOSIS — C50412 Malignant neoplasm of upper-outer quadrant of left female breast: Secondary | ICD-10-CM

## 2022-06-16 DIAGNOSIS — D696 Thrombocytopenia, unspecified: Secondary | ICD-10-CM | POA: Diagnosis not present

## 2022-06-16 DIAGNOSIS — Z8041 Family history of malignant neoplasm of ovary: Secondary | ICD-10-CM | POA: Diagnosis not present

## 2022-06-16 DIAGNOSIS — Z5189 Encounter for other specified aftercare: Secondary | ICD-10-CM | POA: Diagnosis not present

## 2022-06-16 DIAGNOSIS — Z8049 Family history of malignant neoplasm of other genital organs: Secondary | ICD-10-CM | POA: Diagnosis not present

## 2022-06-16 DIAGNOSIS — Z5112 Encounter for antineoplastic immunotherapy: Secondary | ICD-10-CM | POA: Diagnosis not present

## 2022-06-16 DIAGNOSIS — Z803 Family history of malignant neoplasm of breast: Secondary | ICD-10-CM | POA: Diagnosis not present

## 2022-06-16 DIAGNOSIS — Z800 Family history of malignant neoplasm of digestive organs: Secondary | ICD-10-CM | POA: Diagnosis not present

## 2022-06-16 DIAGNOSIS — Z5111 Encounter for antineoplastic chemotherapy: Secondary | ICD-10-CM | POA: Diagnosis not present

## 2022-06-16 DIAGNOSIS — R197 Diarrhea, unspecified: Secondary | ICD-10-CM | POA: Diagnosis not present

## 2022-06-16 LAB — CBC WITH DIFFERENTIAL (CANCER CENTER ONLY)
Abs Immature Granulocytes: 0.22 10*3/uL — ABNORMAL HIGH (ref 0.00–0.07)
Basophils Absolute: 0.1 10*3/uL (ref 0.0–0.1)
Basophils Relative: 1 %
Eosinophils Absolute: 0 10*3/uL (ref 0.0–0.5)
Eosinophils Relative: 0 %
HCT: 28.8 % — ABNORMAL LOW (ref 36.0–46.0)
Hemoglobin: 10.5 g/dL — ABNORMAL LOW (ref 12.0–15.0)
Immature Granulocytes: 4 %
Lymphocytes Relative: 21 %
Lymphs Abs: 1 10*3/uL (ref 0.7–4.0)
MCH: 31.5 pg (ref 26.0–34.0)
MCHC: 36.5 g/dL — ABNORMAL HIGH (ref 30.0–36.0)
MCV: 86.5 fL (ref 80.0–100.0)
Monocytes Absolute: 0.8 10*3/uL (ref 0.1–1.0)
Monocytes Relative: 15 %
Neutro Abs: 2.9 10*3/uL (ref 1.7–7.7)
Neutrophils Relative %: 59 %
Platelet Count: 89 10*3/uL — ABNORMAL LOW (ref 150–400)
RBC: 3.33 MIL/uL — ABNORMAL LOW (ref 3.87–5.11)
RDW: 14.1 % (ref 11.5–15.5)
WBC Count: 5 10*3/uL (ref 4.0–10.5)
nRBC: 0.4 % — ABNORMAL HIGH (ref 0.0–0.2)

## 2022-06-16 LAB — CMP (CANCER CENTER ONLY)
ALT: 44 U/L (ref 0–44)
AST: 26 U/L (ref 15–41)
Albumin: 4 g/dL (ref 3.5–5.0)
Alkaline Phosphatase: 82 U/L (ref 38–126)
Anion gap: 7 (ref 5–15)
BUN: 18 mg/dL (ref 6–20)
CO2: 30 mmol/L (ref 22–32)
Calcium: 9.3 mg/dL (ref 8.9–10.3)
Chloride: 102 mmol/L (ref 98–111)
Creatinine: 1.55 mg/dL — ABNORMAL HIGH (ref 0.44–1.00)
GFR, Estimated: 42 mL/min — ABNORMAL LOW (ref >60)
Glucose, Bld: 100 mg/dL — ABNORMAL HIGH (ref 70–99)
Potassium: 3 mmol/L — ABNORMAL LOW (ref 3.5–5.1)
Sodium: 139 mmol/L (ref 135–145)
Total Bilirubin: 0.7 mg/dL (ref 0.3–1.2)
Total Protein: 6.5 g/dL (ref 6.5–8.1)

## 2022-06-16 LAB — MAGNESIUM: Magnesium: 1.4 mg/dL — ABNORMAL LOW (ref 1.7–2.4)

## 2022-06-16 MED ORDER — POTASSIUM CHLORIDE CRYS ER 20 MEQ PO TBCR
40.0000 meq | EXTENDED_RELEASE_TABLET | Freq: Once | ORAL | Status: AC
  Administered 2022-06-16: 40 meq via ORAL
  Filled 2022-06-16: qty 2

## 2022-06-16 MED ORDER — ONDANSETRON HCL 4 MG/2ML IJ SOLN
4.0000 mg | Freq: Once | INTRAMUSCULAR | Status: AC
  Administered 2022-06-16: 4 mg via INTRAVENOUS
  Filled 2022-06-16: qty 2

## 2022-06-16 MED ORDER — MAGNESIUM SULFATE 2 GM/50ML IV SOLN
2.0000 g | Freq: Once | INTRAVENOUS | Status: AC
  Administered 2022-06-16: 2 g via INTRAVENOUS
  Filled 2022-06-16: qty 50

## 2022-06-16 MED ORDER — SODIUM CHLORIDE 0.9 % IV SOLN: Freq: Once | INTRAVENOUS | Status: AC

## 2022-06-16 MED ORDER — SODIUM CHLORIDE 0.9% FLUSH: 10.0000 mL | Freq: Once | INTRAVENOUS | Status: DC | PRN

## 2022-06-16 MED ORDER — POTASSIUM CHLORIDE 10 MEQ/100ML IV SOLN
10.0000 meq | Freq: Once | INTRAVENOUS | Status: AC
  Administered 2022-06-16: 10 meq via INTRAVENOUS
  Filled 2022-06-16: qty 100

## 2022-06-16 MED ORDER — HEPARIN SOD (PORK) LOCK FLUSH 100 UNIT/ML IV SOLN: 500.0000 [IU] | Freq: Once | INTRAVENOUS | Status: DC | PRN

## 2022-06-16 NOTE — Progress Notes (Signed)
Upon accessing patient's port, it was noted that the area of incision site was not completley healed but there was no longer any skin glue. Patient denied pain, no warmth or drainage noted. Milinda Cave, PA-C notified and steristrips were applied to the area.

## 2022-06-16 NOTE — Progress Notes (Signed)
Orders entered for [facility] visit on Monday, 06/19/22.

## 2022-06-16 NOTE — Progress Notes (Signed)
Symptom Management Consult note [place]    Patient Care Team: [place], Waldemar Dickens, DO as PCP - General (Family Medicine) [name], MD as Consulting Physician (General Surgery) Benay Pike, MD as Consulting Physician (Hematology and Oncology) Kyung Rudd, MD as Consulting Physician (Radiation Oncology) Rockwell Germany, RN as Oncology Nurse Navigator Mauro Kaufmann, RN as Oncology Nurse Navigator    Name of the patient: [name] [name]  [id number]  Aug 23, 1975   Date of visit: 06/16/2022   Chief Complaint/Reason for visit: lab recheck, diarrhea   Current Therapy: TCHP with pegfilgrastim  Last treatment:  Day 1   Cycle 2 on 06/08/22   ASSESSMENT & PLAN: Patient is a 46 y.o. female  with oncologic history of malignant neoplasm of upper-outer quadrant of left breast, ER positive followed by Dr. Chryl Heck.  I have viewed most recent oncology note and lab work.    #)Malignant neoplasm of upper-outer quadrant of left breast, ER positive f - Next treatment scheduled for 07/06/22  #)AKI -Improved today.  Yesterday BUN/creatinine was 27/1.8 and today is 18/1.55.  GFR is improving as well. -Giving IV fluids today and will return for another liter tomorrow.  I will have patient return to clinic Monday morning to have labs checked to further ensure that kidney function is improving.  -Patient is able to tolerate p.o. intake at home and strongly encouraged to push fluids over the weekend.  #) Electrolyte derangement: hypokalemia and hypomagnesemia from GI loss -K 3.0 today. She was unable to pick up PO potassium yesterday as pharmacy was out of stock. She should be able to have it filled today. Patient given 1 run of IV K and 40 mg PO in clinic. -Mag is 1.4. Patient given 2g IV Mag today.  #)Diarrhea -Only had 1 episodes after lomotil, improved to the 6 episodes she had yesterday prior to taking it. -Discussed taking lomotil appropriately. She no sick contacts  or recent antibiotic use, doubt infectious cause. -Patient is well appearing, HDS. Non tender abdomen on exam.  #) Nausea -Zofran given in clinic. She has antiemetics at home.  -She is able to tolerate PO intake, drank a soda while here.  #) [place] was placed by general surgery x 1 month ago.  RN noticed today a small section of wound dehiscence of her incision.  No signs of infection. -Steri-Strips placed and patient encouraged to follow-up with general surgery.  Strict ED precautions discussed should symptoms worsen.    Heme/Onc History: Oncology History  Malignant neoplasm of upper-outer quadrant of left breast in female, estrogen receptor positive ([place])  04/25/2022 Imaging   Patient had diagnostic bilateral mammogram given palpable mass in the left breast.  This showed a 2.6 x 3.5 x 3 cm irregular mass as well as possible satellite mass which is indeterminate.  An ultrasound was recommended.  Ultrasound of the left breast mass showed 3.9 x 2.4 x 2.3 cm irregular mass with an irregular and spiculated margin at 3:00 middle depth 5 cm from the nipple, irregular mass is hypoechoic.  In the left breast at 1 o'clock position, 3 cm from the nipple there is a round mass measuring 0.3 x 0.2 x 0.3 cm.  No significant abnormalities were seen sonographically in the left axilla.  There is another heterogeneous shadowing present at 3 o'clock position 3 cm from the nipple measuring 8 x 8 x 11 mm possible correlate for mammographic finding   05/02/2022 Pathology Results   Left breast  needle core biopsy showed invasive ductal carcinoma, grade 2, DCIS with necrosis.  Left breast needle core biopsy at 1:00 3 cm from the nipple showed fibrocystic changes with usual ductal hyperplasia negative for malignancy.  Prognostic showed ER 95% PR 40%, HER2 3+ KI of 50%   05/08/2022 Initial Diagnosis   Malignant neoplasm of upper-outer quadrant of left breast in female, estrogen receptor positive ([place])    05/08/2022 Cancer Staging   Staging form: Breast, AJCC 8th Edition - Clinical stage from 05/08/2022: Stage IB (cT2, cN0, cM0, G2, ER+, PR+, HER2+) - Signed by Hayden Pedro, PA-C on 05/08/2022 Stage prefix: Initial diagnosis Method of lymph node assessment: Clinical Histologic grading system: 3 grade system   05/18/2022 -  Chemotherapy   Patient is on Treatment Plan : BREAST  Docetaxel + Carboplatin + Trastuzumab + Pertuzumab  (TCHP) q21d      05/23/2022 Genetic Testing   Negative genetics for [facility] CustomNext-Cancer +RNAinsight Panel.  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 Report date is 05/23/2022.   The CustomNext-Cancer+RNAinsight panel offered by [facility] includes sequencing and rearrangement analysis for the following 47 genes:  APC, ATM, AXIN2, BARD1, BMPR1A, BRCA1, BRCA2, BRIP1, CDH1, CDK4, CDKN2A, CHEK2, DICER1, EPCAM, GREM1, HOXB13, MEN1, MLH1, MSH2, MSH3, MSH6, MUTYH, NBN, NF1, NF2, NTHL1, PALB2, PMS2, POLD1, POLE, PTEN, RAD51C, RAD51D, RECQL, RET, SDHA, SDHAF2, SDHB, SDHC, SDHD, SMAD4, SMARCA4, STK11, TP53, TSC1, TSC2, and VHL.  RNA data is routinely analyzed for use in variant interpretation for all genes.       Interval history-: [name] [name] is a 46 y.o. female with oncologic history as above presenting to [facility] today with chief complaint of lab recheck and diarrhea.  Patient is accompanied by her significant other who provides additional history.  Patient states since her visit yesterday she has had 7 episodes of diarrhea.  Stool is loose in consistency, she denies any liquid stool.  She had 6 episodes of diarrhea yesterday and then took Lomotil last night.  Since then she has had 1 episode.  She states she feels much better than she did yesterday.  She has not had any further episodes of feeling like her heart is racing.  She is tolerating p.o. intake and drink 64 ounces of fluids yesterday.  She was able to eat some dinner last night.  Patient is hoping she can  stay out of the hospital and hydrate at home. She has not had any further episodes of epistaxis. Denies fever, chills, chest pain, shortness of breath, urinary symptoms, rash.      ROS  All other systems are reviewed and are negative for acute change except as noted in the HPI.    Allergies  Allergen Reactions   Amoxicillin Shortness Of Breath    rash   Coconut (Cocos Nucifera) Anaphylaxis   Penicillins Shortness Of Breath and Rash    Has patient had a PCN reaction causing immediate rash, facial/tongue/throat swelling, SOB or lightheadedness with hypotension: yes Has patient had a PCN reaction causing severe rash involving mucus membranes or skin necrosis: no Has patient had a PCN reaction that required hospitalization no Has patient had a PCN reaction occurring within the last 10 years: no If all of the above answers are "NO", then may proceed with Cephalosporin use.    Duloxetine Other (See Comments)    Caused suicidal thoughts   Hydrocodone Itching   Hydrocodone-Acetaminophen Rash   Moxifloxacin Rash     Past Medical History:  Diagnosis Date   Anemia  history of   Breast cancer, left ([place])    Dyspnea    even with speaking   Family history of adverse reaction to anesthesia    mother slow to awaken   Family history of breast cancer 05/10/2022   Family history of ovarian cancer 05/10/2022   Fibromyalgia    GERD (gastroesophageal reflux disease)    History of kidney stones    last stone 6 months ago    Migraine    Seasonal allergies    Tinnitus    Vertigo      Past Surgical History:  Procedure Laterality Date   ADENOIDECTOMY  1991   CYSTOSCOPY WITH RETROGRADE PYELOGRAM, URETEROSCOPY AND STENT PLACEMENT Left 02/22/2021   Procedure: [place], URETEROSCOPY AND STENT PLACEMENT;  Surgeon: Remi Haggard, MD;  Location: [facility];  Service: Urology;  Laterality: Left;   CYSTOSCOPY/URETEROSCOPY/HOLMIUM LASER/STENT PLACEMENT Left 03/10/2021    Procedure: CYSTOSCOPY RETROGRADE, LEFT URETEROSCOPY/ STONE BASKETRY [place] LASER/STENT EXCHANGE;  Surgeon: Ardis Hughs, MD;  Location: [facility];  Service: Urology;  Laterality: Left;   ESOPHAGOGASTRODUODENOSCOPY N/A 11/17/2013   Procedure: ESOPHAGOGASTRODUODENOSCOPY (EGD);  Surgeon: Milus Banister, MD;  Location: [place];  Service: Endoscopy;  Laterality: N/A;   ESOPHAGOGASTRODUODENOSCOPY N/A 10/15/2015   Procedure: ESOPHAGOGASTRODUODENOSCOPY (EGD);  Surgeon: Gatha Mayer, MD;  Location[facility];  Service: Endoscopy;  Laterality: N/A;   HOLMIUM LASER APPLICATION  11/08/7614   Procedure: HOLMIUM LASER APPLICATION;  Surgeon: Ardis Hughs, MD;  Location: [facility];  Service: Urology;;   NASAL SEPTOPLASTY W/ TURBINOPLASTY Bilateral 03/18/2018   Procedure: BILATERAL NASAL SEPTOPLASTY WITH TURBINATE REDUCTION;  Surgeon: Jodi Marble, MD;  Location: [place];  Service: ENT;  Laterality: Bilateral;   PORTACATH PLACEMENT Right 05/17/2022   Procedure: PORT-A-CATH INSERTION WITH ULTRASOUND GUIDANCE;  Surgeon: [name], MD;  Location: [facility];  Service: General;  Laterality: Right;   TONSILLECTOMY  1991   TUBAL LIGATION  2005    Social History   Socioeconomic History   Marital status: Single    Spouse name: Not on file   Number of children: Not on file   Years of education: Not on file   Highest education level: Not on file  Occupational History   Not on file  Tobacco Use   Smoking status: Never   Smokeless tobacco: Never  Vaping Use   Vaping Use: Never used  Substance and Sexual Activity   Alcohol use: No   Drug use: No   Sexual activity: Not on file  Other Topics Concern   Not on file  Social History Narrative   ** Merged History Encounter **       Social Determinants of Health   Financial Resource Strain: Low Risk  (05/10/2022)   Overall Financial Resource Strain (CARDIA)    Difficulty of  Paying Living Expenses: Not very hard  Food Insecurity: No Food Insecurity (05/10/2022)   Hunger Vital Sign    Worried About Running Out of Food in the Last Year: Never true    Ran Out of Food in the Last Year: Never true  Transportation Needs: No Transportation Needs (05/10/2022)   PRAPARE - Hydrologist (Medical): No    Lack of Transportation (Non-Medical): No  Physical Activity: Not on file  Stress: Not on file  Social Connections: Not on file  Intimate Partner Violence: Not on file    Family History  Problem Relation Age of Onset  Asthma Mother    Diabetes Mother    Colon polyps Mother    Ovarian cancer Maternal Grandmother 70   Diabetes Maternal Grandmother    Esophageal cancer Maternal Grandfather        d. 62   Breast cancer Other        MGM's sisters x2   Cervical cancer Cousin        mat cousin; dx <40     Current Outpatient Medications:    acetaminophen (TYLENOL) 500 MG tablet, Take 1,000 mg by mouth every 8 (eight) hours as needed for headache., Disp: , Rfl:    cholestyramine (QUESTRAN) 4 g packet, Take 1 packet (4 g total) by mouth 2 (two) times daily., Disp: 60 each, Rfl: 0   dexamethasone (DECADRON) 4 MG tablet, Take 2 tabs by mouth 2 times daily starting day before chemo. Then take 2 tabs daily for 2 days starting day after chemo. Take with food., Disp: 30 tablet, Rfl: 1   diphenoxylate-atropine (LOMOTIL) 2.5-0.025 MG tablet, Take 1 tablet by mouth 4 (four) times daily as needed for diarrhea or loose stools., Disp: 30 tablet, Rfl: 0   lidocaine-prilocaine (EMLA) cream, Apply to affected area once, Disp: 30 g, Rfl: 3   ondansetron (ZOFRAN) 8 MG tablet, Take 1 tablet (8 mg total) by mouth every 8 (eight) hours as needed for nausea or vomiting. Start on the third day after chemotherapy., Disp: 30 tablet, Rfl: 1   potassium chloride SA (KLOR-CON M) 20 MEQ tablet, Take 1 tablet (20 mEq total) by mouth 2 (two) times daily for 7 days., Disp: 14  tablet, Rfl: 0   prochlorperazine (COMPAZINE) 10 MG tablet, Take 1 tablet (10 mg total) by mouth every 6 (six) hours as needed for nausea or vomiting., Disp: 30 tablet, Rfl: 1   traMADol (ULTRAM) 50 MG tablet, Take 1 tablet (50 mg total) by mouth every 6 (six) hours as needed for moderate pain or severe pain., Disp: 25 tablet, Rfl: 0 No current facility-administered medications for this visit.  Facility-Administered Medications Ordered in Other Visits:    heparin lock flush 100 unit/mL, 500 Units, Intracatheter, Once PRN, Iruku, Praveena, MD   sodium chloride flush (NS) 0.9 % injection 10 mL, 10 mL, Intracatheter, Once PRN, Iruku, Praveena, MD  PHYSICAL EXAM: ECOG FS:1 - Symptomatic but completely ambulatory 1 - Symptomatic but completely ambulatory   Vitals:   06/16/22 0904 06/16/22 1146  BP: 119/79 131/84  Pulse: 97 84  Resp: 16 16  Temp: 98.2 F (36.8 C)   TempSrc: Oral   SpO2: 100% 100%  Weight: 180 lb 4.8 oz (81.8 kg)    Physical Exam Vitals and nursing note reviewed.  Constitutional:      Appearance: She is well-developed. She is not ill-appearing or toxic-appearing.  HENT:     Head: Normocephalic.     Nose: Nose normal.  Eyes:     Conjunctiva/sclera: Conjunctivae normal.  Neck:     Vascular: No JVD.  Cardiovascular:     Rate and Rhythm: Normal rate and regular rhythm.     Pulses: Normal pulses.     Heart sounds: Normal heart sounds.  Pulmonary:     Effort: Pulmonary effort is normal.     Breath sounds: Normal breath sounds.  Chest:     Comments: PAC in right upper chest with small area of dehiscence of incision. No tenderness, no drainage. Please see media below Abdominal:     General: There is no distension.  Musculoskeletal:  Cervical back: Normal range of motion.     Right lower leg: No edema.     Left lower leg: No edema.  Skin:    General: Skin is warm and dry.  Neurological:     Mental Status: She is oriented to person, place, and time.         LABORATORY DATA: I have reviewed the data as listed    Latest Ref Rng & Units 06/16/2022    9:05 AM 06/15/2022    8:37 AM 06/08/2022    8:49 AM  CBC  WBC 4.0 - 10.5 K/uL 5.0  2.1  9.6   Hemoglobin 12.0 - 15.0 g/dL 10.5  12.5  11.0   Hematocrit 36.0 - 46.0 % 28.8  34.2  31.8   Platelets 150 - 400 K/uL 89  90  225         Latest Ref Rng & Units 06/16/2022    9:05 AM 06/15/2022    8:37 AM 06/08/2022    8:49 AM  CMP  Glucose 70 - 99 mg/dL 100  126  154   BUN 6 - 20 mg/dL _0 Creatinine 0.44 - 1.00 mg/dL 1.55  1.80  0.69   Sodium 135 - 145 mmol/L 139  137  139   Potassium 3.5 - 5.1 mmol/L 3.0  3.1  3.5   Chloride 98 - 111 mmol/L 102  97  105   CO2 22 - 32 mmol/L _1 Calcium 8.9 - 10.3 mg/dL 9.3  10.2  9.6   Total Protein 6.5 - 8.1 g/dL 6.5  7.4  6.8   Total Bilirubin 0.3 - 1.2 mg/dL 0.7  1.1  0.5   Alkaline Phos 38 - 126 U/L 82  88  64   AST 15 - 41 U/L _2 ALT 0 - 44 U/L 44  51  27        RADIOGRAPHIC STUDIES (from last 24 hours if applicable) I have personally reviewed the radiological images as listed and agreed with the findings in the report. No results found.      Visit Diagnosis: 1. Hypokalemia   2. AKI (acute kidney injury) ([place])   3. Malignant neoplasm of upper-outer quadrant of left breast in female, estrogen receptor positive ([place])   4. Chemotherapy induced diarrhea   5. Port-A-Cath in place   6. Hypomagnesemia   7. Chemotherapy-induced nausea      No orders of the defined types were placed in this encounter.   All questions were answered. The patient knows to call the clinic with any problems, questions or concerns. No barriers to learning was detected.  I have spent a total of 20 minutes minutes of face-to-face and non-face-to-face time, preparing to see the patient, obtaining and/or reviewing separately obtained history, performing a medically appropriate examination, counseling and educating the patient, ordering  tests, documenting clinical information in the electronic health record, and care coordination (communications with other health care professionals or caregivers).    Thank you for allowing me to participate in the care of this patient.    Barrie Folk, PA-C Department of Hematology/Oncology [facility] at [facility] Phone: [phone]  [phone]    06/16/2022 12:20 PM

## 2022-06-17 ENCOUNTER — Inpatient Hospital Stay: Payer: 59

## 2022-06-17 VITALS — BP 117/60 | HR 83 | Temp 97.5°F | Resp 17

## 2022-06-17 DIAGNOSIS — D696 Thrombocytopenia, unspecified: Secondary | ICD-10-CM | POA: Diagnosis not present

## 2022-06-17 DIAGNOSIS — T451X5A Adverse effect of antineoplastic and immunosuppressive drugs, initial encounter: Secondary | ICD-10-CM | POA: Diagnosis not present

## 2022-06-17 DIAGNOSIS — E860 Dehydration: Secondary | ICD-10-CM | POA: Diagnosis not present

## 2022-06-17 DIAGNOSIS — D6959 Other secondary thrombocytopenia: Secondary | ICD-10-CM | POA: Diagnosis not present

## 2022-06-17 DIAGNOSIS — Z170 Estrogen receptor positive status [ER+]: Secondary | ICD-10-CM

## 2022-06-17 DIAGNOSIS — D649 Anemia, unspecified: Secondary | ICD-10-CM | POA: Diagnosis not present

## 2022-06-17 DIAGNOSIS — Z5189 Encounter for other specified aftercare: Secondary | ICD-10-CM | POA: Diagnosis not present

## 2022-06-17 DIAGNOSIS — E876 Hypokalemia: Secondary | ICD-10-CM | POA: Diagnosis not present

## 2022-06-17 DIAGNOSIS — N179 Acute kidney failure, unspecified: Secondary | ICD-10-CM | POA: Diagnosis not present

## 2022-06-17 DIAGNOSIS — C50412 Malignant neoplasm of upper-outer quadrant of left female breast: Secondary | ICD-10-CM | POA: Diagnosis not present

## 2022-06-17 DIAGNOSIS — Z5111 Encounter for antineoplastic chemotherapy: Secondary | ICD-10-CM | POA: Diagnosis not present

## 2022-06-17 DIAGNOSIS — Z8041 Family history of malignant neoplasm of ovary: Secondary | ICD-10-CM | POA: Diagnosis not present

## 2022-06-17 DIAGNOSIS — Z8049 Family history of malignant neoplasm of other genital organs: Secondary | ICD-10-CM | POA: Diagnosis not present

## 2022-06-17 DIAGNOSIS — Z5112 Encounter for antineoplastic immunotherapy: Secondary | ICD-10-CM | POA: Diagnosis not present

## 2022-06-17 DIAGNOSIS — Z800 Family history of malignant neoplasm of digestive organs: Secondary | ICD-10-CM | POA: Diagnosis not present

## 2022-06-17 DIAGNOSIS — Z803 Family history of malignant neoplasm of breast: Secondary | ICD-10-CM | POA: Diagnosis not present

## 2022-06-17 DIAGNOSIS — D708 Other neutropenia: Secondary | ICD-10-CM | POA: Diagnosis not present

## 2022-06-17 DIAGNOSIS — R197 Diarrhea, unspecified: Secondary | ICD-10-CM | POA: Diagnosis not present

## 2022-06-17 MED ORDER — ONDANSETRON HCL 4 MG/2ML IJ SOLN
4.0000 mg | Freq: Once | INTRAMUSCULAR | Status: AC
  Administered 2022-06-17: 4 mg via INTRAVENOUS
  Filled 2022-06-17: qty 2

## 2022-06-17 MED ORDER — SODIUM CHLORIDE 0.9 % IV SOLN: Freq: Once | INTRAVENOUS | Status: AC

## 2022-06-17 NOTE — Patient Instructions (Signed)

## 2022-06-18 ENCOUNTER — Other Ambulatory Visit: Payer: Self-pay | Admitting: Adult Health

## 2022-06-18 DIAGNOSIS — Z170 Estrogen receptor positive status [ER+]: Secondary | ICD-10-CM

## 2022-06-19 ENCOUNTER — Inpatient Hospital Stay: Payer: 59

## 2022-06-19 ENCOUNTER — Other Ambulatory Visit: Payer: Self-pay | Admitting: Hematology and Oncology

## 2022-06-19 ENCOUNTER — Inpatient Hospital Stay (HOSPITAL_BASED_OUTPATIENT_CLINIC_OR_DEPARTMENT_OTHER): Payer: 59 | Admitting: Physician Assistant

## 2022-06-19 VITALS — BP 130/84 | HR 80 | Temp 98.5°F | Resp 16 | Wt 183.0 lb

## 2022-06-19 DIAGNOSIS — Z800 Family history of malignant neoplasm of digestive organs: Secondary | ICD-10-CM | POA: Diagnosis not present

## 2022-06-19 DIAGNOSIS — R197 Diarrhea, unspecified: Secondary | ICD-10-CM | POA: Diagnosis not present

## 2022-06-19 DIAGNOSIS — Z5111 Encounter for antineoplastic chemotherapy: Secondary | ICD-10-CM | POA: Diagnosis not present

## 2022-06-19 DIAGNOSIS — Z8049 Family history of malignant neoplasm of other genital organs: Secondary | ICD-10-CM | POA: Diagnosis not present

## 2022-06-19 DIAGNOSIS — Z95828 Presence of other vascular implants and grafts: Secondary | ICD-10-CM | POA: Diagnosis not present

## 2022-06-19 DIAGNOSIS — Z8041 Family history of malignant neoplasm of ovary: Secondary | ICD-10-CM | POA: Diagnosis not present

## 2022-06-19 DIAGNOSIS — Z170 Estrogen receptor positive status [ER+]: Secondary | ICD-10-CM

## 2022-06-19 DIAGNOSIS — D649 Anemia, unspecified: Secondary | ICD-10-CM | POA: Diagnosis not present

## 2022-06-19 DIAGNOSIS — Z5189 Encounter for other specified aftercare: Secondary | ICD-10-CM | POA: Diagnosis not present

## 2022-06-19 DIAGNOSIS — D708 Other neutropenia: Secondary | ICD-10-CM | POA: Diagnosis not present

## 2022-06-19 DIAGNOSIS — E860 Dehydration: Secondary | ICD-10-CM | POA: Diagnosis not present

## 2022-06-19 DIAGNOSIS — E876 Hypokalemia: Secondary | ICD-10-CM | POA: Diagnosis not present

## 2022-06-19 DIAGNOSIS — C50412 Malignant neoplasm of upper-outer quadrant of left female breast: Secondary | ICD-10-CM

## 2022-06-19 DIAGNOSIS — N179 Acute kidney failure, unspecified: Secondary | ICD-10-CM | POA: Diagnosis not present

## 2022-06-19 DIAGNOSIS — T451X5A Adverse effect of antineoplastic and immunosuppressive drugs, initial encounter: Secondary | ICD-10-CM | POA: Diagnosis not present

## 2022-06-19 DIAGNOSIS — D6959 Other secondary thrombocytopenia: Secondary | ICD-10-CM | POA: Diagnosis not present

## 2022-06-19 DIAGNOSIS — Z5112 Encounter for antineoplastic immunotherapy: Secondary | ICD-10-CM | POA: Diagnosis not present

## 2022-06-19 DIAGNOSIS — D696 Thrombocytopenia, unspecified: Secondary | ICD-10-CM | POA: Diagnosis not present

## 2022-06-19 DIAGNOSIS — Z803 Family history of malignant neoplasm of breast: Secondary | ICD-10-CM | POA: Diagnosis not present

## 2022-06-19 LAB — CBC WITH DIFFERENTIAL (CANCER CENTER ONLY)
Abs Immature Granulocytes: 0.51 10*3/uL — ABNORMAL HIGH (ref 0.00–0.07)
Basophils Absolute: 0 10*3/uL (ref 0.0–0.1)
Basophils Relative: 0 %
Eosinophils Absolute: 0 10*3/uL (ref 0.0–0.5)
Eosinophils Relative: 0 %
HCT: 27 % — ABNORMAL LOW (ref 36.0–46.0)
Hemoglobin: 9.7 g/dL — ABNORMAL LOW (ref 12.0–15.0)
Immature Granulocytes: 7 %
Lymphocytes Relative: 15 %
Lymphs Abs: 1.1 10*3/uL (ref 0.7–4.0)
MCH: 31.6 pg (ref 26.0–34.0)
MCHC: 35.9 g/dL (ref 30.0–36.0)
MCV: 87.9 fL (ref 80.0–100.0)
Monocytes Absolute: 0.4 10*3/uL (ref 0.1–1.0)
Monocytes Relative: 5 %
Neutro Abs: 5.5 10*3/uL (ref 1.7–7.7)
Neutrophils Relative %: 73 %
Platelet Count: 81 10*3/uL — ABNORMAL LOW (ref 150–400)
RBC: 3.07 MIL/uL — ABNORMAL LOW (ref 3.87–5.11)
RDW: 15.3 % (ref 11.5–15.5)
WBC Count: 7.4 10*3/uL (ref 4.0–10.5)
nRBC: 0 % (ref 0.0–0.2)

## 2022-06-19 LAB — CMP (CANCER CENTER ONLY)
ALT: 33 U/L (ref 0–44)
AST: 26 U/L (ref 15–41)
Albumin: 3.7 g/dL (ref 3.5–5.0)
Alkaline Phosphatase: 81 U/L (ref 38–126)
Anion gap: 5 (ref 5–15)
BUN: 6 mg/dL (ref 6–20)
CO2: 29 mmol/L (ref 22–32)
Calcium: 8.9 mg/dL (ref 8.9–10.3)
Chloride: 106 mmol/L (ref 98–111)
Creatinine: 1.2 mg/dL — ABNORMAL HIGH (ref 0.44–1.00)
GFR, Estimated: 57 mL/min — ABNORMAL LOW (ref >60)
Glucose, Bld: 104 mg/dL — ABNORMAL HIGH (ref 70–99)
Potassium: 3 mmol/L — ABNORMAL LOW (ref 3.5–5.1)
Sodium: 140 mmol/L (ref 135–145)
Total Bilirubin: 0.4 mg/dL (ref 0.3–1.2)
Total Protein: 6 g/dL — ABNORMAL LOW (ref 6.5–8.1)

## 2022-06-19 LAB — MAGNESIUM: Magnesium: 1.4 mg/dL — ABNORMAL LOW (ref 1.7–2.4)

## 2022-06-19 MED ORDER — POTASSIUM CHLORIDE CRYS ER 20 MEQ PO TBCR
40.0000 meq | EXTENDED_RELEASE_TABLET | Freq: Once | ORAL | Status: AC
  Administered 2022-06-19: 40 meq via ORAL
  Filled 2022-06-19: qty 2

## 2022-06-19 MED ORDER — POTASSIUM CHLORIDE 10 MEQ/100ML IV SOLN
10.0000 meq | Freq: Once | INTRAVENOUS | Status: AC
  Administered 2022-06-19: 10 meq via INTRAVENOUS
  Filled 2022-06-19: qty 100

## 2022-06-19 MED ORDER — SODIUM CHLORIDE 0.9 % IV SOLN: Freq: Once | INTRAVENOUS | Status: AC

## 2022-06-19 NOTE — Progress Notes (Signed)
IV started due to steristrips covering port site. Steristrips were placed 06/16/22 in [facility] to help port site incision heal. Patient denies pain, warmth or redness over port site at this time.

## 2022-06-19 NOTE — Progress Notes (Signed)
Dose adjusted for toxicity.  [name] [name]

## 2022-06-19 NOTE — Progress Notes (Signed)
Symptom Management Consult note [place]    Patient Care Team: [place], Waldemar Dickens, DO as PCP - General (Family Medicine) [name], MD as Consulting Physician (General Surgery) Benay Pike, MD as Consulting Physician (Hematology and Oncology) Kyung Rudd, MD as Consulting Physician (Radiation Oncology) Rockwell Germany, RN as Oncology Nurse Navigator Mauro Kaufmann, RN as Oncology Nurse Navigator    Name of the patient: [name] [name]  [id number]  09-17-1975   Date of visit: 06/19/2022   Chief Complaint/Reason for visit: lab recheck   Current Therapy: TCHP with pegfilgrastim  Last treatment:  Day 1   Cycle 2 on 06/08/22   ASSESSMENT & PLAN: Patient is a 46 y.o. female  with oncologic history of malignant neoplasm of upper-outer quadrant of left breast, ER positive followed by Dr. Chryl Heck.  I have viewed most recent oncology note and lab work.    #)Malignant neoplasm of upper-outer quadrant of left breast, ER positive - Next appointment with oncologist is 12/21 and she will also have lab work drawn that day.   #)AKI -CMP today shows BUN/Cr 6/1.20 which shows she continues to trend down. GFR is improved at 57. -Patient given 1L IVF in clinic today for support. Patient will push fluids at home and call to schedule appt for IVF if she is unable to hydrate well. Discussed the importance of hydrating and kidney function with patient which she understands. -Clinically patient does not appear dehydrated on exam.  #) Anemia and thrombocytopenia -CBC today shows hemoglobin of 9.7 and platelet count of 81 K.  Still trending down compared to last week.  No intervention needed at this time.  Labs will continue to be trended at upcoming oncology appointments. -With patient's delayed next chemo treatment her counts will hopefully be recovered by then.  #) Hypokalemia and hypomagnesemia -CMP shows potassium of 3 today.  Patient is taking p.o. potassium at  home however did not have any yet today. -Patient given 1 round of IV and 40 mg p.o. -Patient politely declines IV or PO magnesium today as it made her feel so poorly with a migraine last time.  Discussed the importance of electrolyte replacement and patient will continue electrolyte replacement with Pedialyte and try to eat foods rich in both potassium and magnesium. Handout provided on this.  Strict ED precautions discussed should symptoms worsen.    Heme/Onc History: Oncology History  Malignant neoplasm of upper-outer quadrant of left breast in female, estrogen receptor positive ([place])  04/25/2022 Imaging   Patient had diagnostic bilateral mammogram given palpable mass in the left breast.  This showed a 2.6 x 3.5 x 3 cm irregular mass as well as possible satellite mass which is indeterminate.  An ultrasound was recommended.  Ultrasound of the left breast mass showed 3.9 x 2.4 x 2.3 cm irregular mass with an irregular and spiculated margin at 3:00 middle depth 5 cm from the nipple, irregular mass is hypoechoic.  In the left breast at 1 o'clock position, 3 cm from the nipple there is a round mass measuring 0.3 x 0.2 x 0.3 cm.  No significant abnormalities were seen sonographically in the left axilla.  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  Left breast needle core biopsy at  1:00 3 cm from the nipple showed fibrocystic changes with usual ductal hyperplasia negative for malignancy.  Prognostic showed ER 95% PR 40%, HER2 3+ KI of 50%   05/08/2022 Initial Diagnosis   Malignant neoplasm of upper-outer quadrant of left breast in female, estrogen receptor positive ([place])   05/08/2022 Cancer Staging   Staging form: Breast, AJCC 8th Edition - Clinical stage  from 05/08/2022: Stage IB (cT2, cN0, cM0, G2, ER+, PR+, HER2+) - Signed by Hayden Pedro, PA-C on 05/08/2022 Stage prefix: Initial diagnosis Method of lymph node assessment: Clinical Histologic grading system: 3 grade system   05/18/2022 -  Chemotherapy   Patient is on Treatment Plan : BREAST  Docetaxel + Carboplatin + Trastuzumab + Pertuzumab  (TCHP) q21d      05/23/2022 Genetic Testing   Negative genetics for [facility] CustomNext-Cancer +RNAinsight Panel.  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 Report date is 05/23/2022.   The CustomNext-Cancer+RNAinsight panel offered by [facility] includes sequencing and rearrangement analysis for the following 47 genes:  APC, ATM, AXIN2, BARD1, BMPR1A, BRCA1, BRCA2, BRIP1, CDH1, CDK4, CDKN2A, CHEK2, DICER1, EPCAM, GREM1, HOXB13, MEN1, MLH1, MSH2, MSH3, MSH6, MUTYH, NBN, NF1, NF2, NTHL1, PALB2, PMS2, POLD1, POLE, PTEN, RAD51C, RAD51D, RECQL, RET, SDHA, SDHAF2, SDHB, SDHC, SDHD, SMAD4, SMARCA4, STK11, TP53, TSC1, TSC2, and VHL.  RNA data is routinely analyzed for use in variant interpretation for all genes.       Interval history-: [name] is a 46 y.o. female with oncologic history as above presenting to [facility] today with chief complaint of lab recheck.  Patient presents with her significant other who provides additional history.  Patient was seen last week for diarrhea that, dehydration and AKI.  She received 3 days of IV fluids and is presenting today for recheck of kidney function.  Patient tells me that she feels much improved compared to last week.  She had 3 episodes of loose stool over the the 48 hours.  She is tolerating p.o. intake and drinking 64 ounces of fluid daily.  She reports that her taste buds have changed which make it harder to eat since everything tastes different than what she is used to.  Since receiving IV fluids patient has not had any further episodes of heart racing.  Patient is relieved she is feeling much better  now.  Patient also notes that after receiving IV magnesium at clinic visit on 06/16/2022 she developed a migraine that lasted 24 hours and eventually resolved with Tylenol.  She has a history of migraines although while on chemotherapy they have not been bothering her.  She has never had IV magnesium before.  She states the migraine felt like her typical migraine and denies any numbness, weakness, tingling, visual changes, balance issues.    ROS  All other systems are reviewed and are negative for acute change except as noted in the HPI.    Allergies  Allergen Reactions   Amoxicillin Shortness Of Breath    rash   Coconut (Cocos Nucifera) Anaphylaxis   Penicillins Shortness Of Breath and Rash    Has patient had a PCN reaction causing immediate rash, facial/tongue/throat swelling, SOB or lightheadedness with hypotension: yes Has patient had a PCN reaction causing severe rash involving mucus membranes or skin necrosis: no Has patient had a PCN reaction that required hospitalization no Has patient had a PCN reaction occurring within the last 10 years: no If all of the above answers are "NO", then may proceed with Cephalosporin use.  Duloxetine Other (See Comments)    Caused suicidal thoughts   Hydrocodone Itching   Hydrocodone-Acetaminophen Rash   Moxifloxacin Rash     Past Medical History:  Diagnosis Date   Anemia    history of   Breast cancer, left ([place])    Dyspnea    even with speaking   Family history of adverse reaction to anesthesia    mother slow to awaken   Family history of breast cancer 05/10/2022   Family history of ovarian cancer 05/10/2022   Fibromyalgia    GERD (gastroesophageal reflux disease)    History of kidney stones    last stone 6 months ago    Migraine    Seasonal allergies    Tinnitus    Vertigo      Past Surgical History:  Procedure Laterality Date   ADENOIDECTOMY  1991   CYSTOSCOPY WITH RETROGRADE PYELOGRAM, URETEROSCOPY AND STENT  PLACEMENT Left 02/22/2021   Procedure: [place], URETEROSCOPY AND STENT PLACEMENT;  Surgeon: Remi Haggard, MD;  Location: [facility];  Service: Urology;  Laterality: Left;   CYSTOSCOPY/URETEROSCOPY/HOLMIUM LASER/STENT PLACEMENT Left 03/10/2021   Procedure: CYSTOSCOPY RETROGRADE, LEFT URETEROSCOPY/ STONE BASKETRY [place] LASER/STENT EXCHANGE;  Surgeon: Ardis Hughs, MD;  Location: [facility];  Service: Urology;  Laterality: Left;   ESOPHAGOGASTRODUODENOSCOPY N/A 11/17/2013   Procedure: ESOPHAGOGASTRODUODENOSCOPY (EGD);  Surgeon: Milus Banister, MD;  Location: [facility];  Service: Endoscopy;  Laterality: N/A;   ESOPHAGOGASTRODUODENOSCOPY N/A 10/15/2015   Procedure: ESOPHAGOGASTRODUODENOSCOPY (EGD);  Surgeon: Gatha Mayer, MD;  [facility];  Service: Endoscopy;  Laterality: N/A;   HOLMIUM LASER APPLICATION  0/02/6577   Procedure: HOLMIUM LASER APPLICATION;  Surgeon: Ardis Hughs, MD;  [facility];  Service: Urology;;   NASAL SEPTOPLASTY W/ TURBINOPLASTY Bilateral 03/18/2018   Procedure: BILATERAL NASAL SEPTOPLASTY WITH TURBINATE REDUCTION;  Surgeon: Jodi Marble, MD;  Location: [facility];  Service: ENT;  Laterality: Bilateral;   PORTACATH PLACEMENT Right 05/17/2022   Procedure: PORT-A-CATH INSERTION WITH ULTRASOUND GUIDANCE;  Surgeon: [name], MD;  Location: [facility];  Service: General;  Laterality: Right;   TONSILLECTOMY  1991   TUBAL LIGATION  2005    Social History   Socioeconomic History   Marital status: Single    Spouse name: Not on file   Number of children: Not on file   Years of education: Not on file   Highest education level: Not on file  Occupational History   Not on file  Tobacco Use   Smoking status: Never   Smokeless tobacco: Never  Vaping Use   Vaping Use: Never used  Substance and Sexual Activity   Alcohol use: No   Drug use: No   Sexual  activity: Not on file  Other Topics Concern   Not on file  Social History Narrative   ** Merged History Encounter **       Social Determinants of Health   Financial Resource Strain: Low Risk  (05/10/2022)   Overall Financial Resource Strain (CARDIA)    Difficulty of Paying Living Expenses: Not very hard  Food Insecurity: No Food Insecurity (05/10/2022)   Hunger Vital Sign    Worried About Running Out of Food in the Last Year: Never true    Ran Out of Food in the Last Year: Never true  Transportation Needs: No Transportation Needs (05/10/2022)   PRAPARE - Hydrologist (Medical): No    Lack of Transportation (  Non-Medical): No  Physical Activity: Not on file  Stress: Not on file  Social Connections: Not on file  Intimate Partner Violence: Not on file    Family History  Problem Relation Age of Onset   Asthma Mother    Diabetes Mother    Colon polyps Mother    Ovarian cancer Maternal Grandmother 26   Diabetes Maternal Grandmother    Esophageal cancer Maternal Grandfather        d. 70   Breast cancer Other        MGM's sisters x2   Cervical cancer Cousin        mat cousin; dx <40     Current Outpatient Medications:    acetaminophen (TYLENOL) 500 MG tablet, Take 1,000 mg by mouth every 8 (eight) hours as needed for headache., Disp: , Rfl:    cholestyramine (QUESTRAN) 4 g packet, TAKE 1 PACKET (4 G TOTAL) BY MOUTH 2 TIMES DAILY., Disp: 180 packet, Rfl: 1   dexamethasone (DECADRON) 4 MG tablet, Take 2 tabs by mouth 2 times daily starting day before chemo. Then take 2 tabs daily for 2 days starting day after chemo. Take with food., Disp: 30 tablet, Rfl: 1   diphenoxylate-atropine (LOMOTIL) 2.5-0.025 MG tablet, Take 1 tablet by mouth 4 (four) times daily as needed for diarrhea or loose stools., Disp: 30 tablet, Rfl: 0   lidocaine-prilocaine (EMLA) cream, Apply to affected area once, Disp: 30 g, Rfl: 3   ondansetron (ZOFRAN) 8 MG tablet, Take 1 tablet (8  mg total) by mouth every 8 (eight) hours as needed for nausea or vomiting. Start on the third day after chemotherapy., Disp: 30 tablet, Rfl: 1   potassium chloride SA (KLOR-CON M) 20 MEQ tablet, Take 1 tablet (20 mEq total) by mouth 2 (two) times daily for 7 days., Disp: 14 tablet, Rfl: 0   prochlorperazine (COMPAZINE) 10 MG tablet, Take 1 tablet (10 mg total) by mouth every 6 (six) hours as needed for nausea or vomiting., Disp: 30 tablet, Rfl: 1   traMADol (ULTRAM) 50 MG tablet, Take 1 tablet (50 mg total) by mouth every 6 (six) hours as needed for moderate pain or severe pain., Disp: 25 tablet, Rfl: 0 No current facility-administered medications for this visit.  Facility-Administered Medications Ordered in Other Visits:    potassium chloride 10 mEq in 100 mL IVPB, 10 mEq, Intravenous, Once, Walisiewicz, Shaniqwa Horsman E, PA-C, Last Rate: 100 mL/hr at 06/19/22 1022, 10 mEq at 06/19/22 1022  PHYSICAL EXAM: ECOG FS:1 - Symptomatic but completely ambulatory    Vitals:   06/19/22 0927  BP: 130/84  Pulse: 80  Resp: 16  Temp: 98.5 F (36.9 C)  TempSrc: Oral  SpO2: 100%  Weight: 183 lb (83 kg)   Physical Exam Vitals and nursing note reviewed.  Constitutional:      Appearance: She is well-developed. She is not ill-appearing or toxic-appearing.  HENT:     Head: Normocephalic.     Nose: Nose normal.  Eyes:     Conjunctiva/sclera: Conjunctivae normal.  Neck:     Vascular: No JVD.  Cardiovascular:     Rate and Rhythm: Normal rate and regular rhythm.     Pulses: Normal pulses.     Heart sounds: Normal heart sounds.  Pulmonary:     Effort: Pulmonary effort is normal.     Breath sounds: Normal breath sounds.  Chest:     Comments: PAC in right upper chest with Steri-Strips intact.  No surrounding erythema or tenderness.  No signs of infection. Abdominal:     General: There is no distension.  Musculoskeletal:     Cervical back: Normal range of motion.  Skin:    General: Skin is warm and  dry.  Neurological:     Mental Status: She is oriented to person, place, and time.        LABORATORY DATA: I have reviewed the data as listed    Latest Ref Rng & Units 06/19/2022    9:01 AM 06/16/2022    9:05 AM 06/15/2022    8:37 AM  CBC  WBC 4.0 - 10.5 K/uL 7.4  5.0  2.1   Hemoglobin 12.0 - 15.0 g/dL 9.7  10.5  12.5   Hematocrit 36.0 - 46.0 % 27.0  28.8  34.2   Platelets 150 - 400 K/uL 81  89  90         Latest Ref Rng & Units 06/19/2022    9:01 AM 06/16/2022    9:05 AM 06/15/2022    8:37 AM  CMP  Glucose 70 - 99 mg/dL 104  100  126   BUN 6 - 20 mg/dL _0 Creatinine 0.44 - 1.00 mg/dL 1.20  1.55  1.80   Sodium 135 - 145 mmol/L 140  139  137   Potassium 3.5 - 5.1 mmol/L 3.0  3.0  3.1   Chloride 98 - 111 mmol/L 106  102  97   CO2 22 - 32 mmol/L _1 Calcium 8.9 - 10.3 mg/dL 8.9  9.3  10.2   Total Protein 6.5 - 8.1 g/dL 6.0  6.5  7.4   Total Bilirubin 0.3 - 1.2 mg/dL 0.4  0.7  1.1   Alkaline Phos 38 - 126 U/L 81  82  88   AST 15 - 41 U/L _2 ALT 0 - 44 U/L 33  44  51        RADIOGRAPHIC STUDIES (from last 24 hours if applicable) I have personally reviewed the radiological images as listed and agreed with the findings in the report. No results found.      Visit Diagnosis: 1. Hypokalemia   2. AKI (acute kidney injury) ([place])   3. Port-A-Cath in place   4. Malignant neoplasm of upper-outer quadrant of left breast in female, estrogen receptor positive ([place])      No orders of the defined types were placed in this encounter.   All questions were answered. The patient knows to call the clinic with any problems, questions or concerns. No barriers to learning was detected.  I have spent a total of 30 minutes minutes of face-to-face and non-face-to-face time, preparing to see the patient, obtaining and/or reviewing separately obtained history, performing a medically appropriate examination, counseling and educating the patient, ordering  tests, documenting clinical information in the electronic health record, and care coordination (communications with other health care professionals or caregivers).    Thank you for allowing me to participate in the care of this patient.    Barrie Folk, PA-C Department of Hematology/Oncology [facility] Phone: [phone]  [phone]    06/19/2022 11:00 AM

## 2022-06-22 ENCOUNTER — Inpatient Hospital Stay: Payer: 59

## 2022-06-27 ENCOUNTER — Other Ambulatory Visit: Payer: Self-pay | Admitting: Hematology and Oncology

## 2022-06-29 ENCOUNTER — Encounter: Payer: Self-pay | Admitting: Hematology and Oncology

## 2022-06-29 ENCOUNTER — Ambulatory Visit: Payer: 59

## 2022-06-29 ENCOUNTER — Inpatient Hospital Stay: Payer: 59

## 2022-06-29 ENCOUNTER — Inpatient Hospital Stay (HOSPITAL_BASED_OUTPATIENT_CLINIC_OR_DEPARTMENT_OTHER): Payer: 59 | Admitting: Hematology and Oncology

## 2022-06-29 VITALS — BP 133/87 | HR 86 | Temp 97.7°F | Resp 16 | Ht 68.0 in | Wt 187.1 lb

## 2022-06-29 DIAGNOSIS — R197 Diarrhea, unspecified: Secondary | ICD-10-CM | POA: Diagnosis not present

## 2022-06-29 DIAGNOSIS — Z803 Family history of malignant neoplasm of breast: Secondary | ICD-10-CM | POA: Diagnosis not present

## 2022-06-29 DIAGNOSIS — E860 Dehydration: Secondary | ICD-10-CM | POA: Diagnosis not present

## 2022-06-29 DIAGNOSIS — Z800 Family history of malignant neoplasm of digestive organs: Secondary | ICD-10-CM | POA: Diagnosis not present

## 2022-06-29 DIAGNOSIS — D696 Thrombocytopenia, unspecified: Secondary | ICD-10-CM | POA: Diagnosis not present

## 2022-06-29 DIAGNOSIS — C50412 Malignant neoplasm of upper-outer quadrant of left female breast: Secondary | ICD-10-CM | POA: Diagnosis not present

## 2022-06-29 DIAGNOSIS — Z8049 Family history of malignant neoplasm of other genital organs: Secondary | ICD-10-CM | POA: Diagnosis not present

## 2022-06-29 DIAGNOSIS — Z5112 Encounter for antineoplastic immunotherapy: Secondary | ICD-10-CM | POA: Diagnosis not present

## 2022-06-29 DIAGNOSIS — Z5189 Encounter for other specified aftercare: Secondary | ICD-10-CM | POA: Diagnosis not present

## 2022-06-29 DIAGNOSIS — Z170 Estrogen receptor positive status [ER+]: Secondary | ICD-10-CM | POA: Diagnosis not present

## 2022-06-29 DIAGNOSIS — E876 Hypokalemia: Secondary | ICD-10-CM | POA: Diagnosis not present

## 2022-06-29 DIAGNOSIS — Z5111 Encounter for antineoplastic chemotherapy: Secondary | ICD-10-CM | POA: Diagnosis not present

## 2022-06-29 DIAGNOSIS — D708 Other neutropenia: Secondary | ICD-10-CM | POA: Diagnosis not present

## 2022-06-29 DIAGNOSIS — T451X5A Adverse effect of antineoplastic and immunosuppressive drugs, initial encounter: Secondary | ICD-10-CM | POA: Diagnosis not present

## 2022-06-29 DIAGNOSIS — D649 Anemia, unspecified: Secondary | ICD-10-CM | POA: Diagnosis not present

## 2022-06-29 DIAGNOSIS — Z8041 Family history of malignant neoplasm of ovary: Secondary | ICD-10-CM | POA: Diagnosis not present

## 2022-06-29 DIAGNOSIS — N179 Acute kidney failure, unspecified: Secondary | ICD-10-CM | POA: Diagnosis not present

## 2022-06-29 DIAGNOSIS — D6959 Other secondary thrombocytopenia: Secondary | ICD-10-CM | POA: Diagnosis not present

## 2022-06-29 LAB — CMP (CANCER CENTER ONLY)
ALT: 27 U/L (ref 0–44)
AST: 24 U/L (ref 15–41)
Albumin: 3.8 g/dL (ref 3.5–5.0)
Alkaline Phosphatase: 61 U/L (ref 38–126)
Anion gap: 6 (ref 5–15)
BUN: 15 mg/dL (ref 6–20)
CO2: 31 mmol/L (ref 22–32)
Calcium: 9.2 mg/dL (ref 8.9–10.3)
Chloride: 106 mmol/L (ref 98–111)
Creatinine: 1.23 mg/dL — ABNORMAL HIGH (ref 0.44–1.00)
GFR, Estimated: 56 mL/min — ABNORMAL LOW (ref >60)
Glucose, Bld: 123 mg/dL — ABNORMAL HIGH (ref 70–99)
Potassium: 3.7 mmol/L (ref 3.5–5.1)
Sodium: 143 mmol/L (ref 135–145)
Total Bilirubin: 0.4 mg/dL (ref 0.3–1.2)
Total Protein: 6.2 g/dL — ABNORMAL LOW (ref 6.5–8.1)

## 2022-06-29 LAB — CBC WITH DIFFERENTIAL (CANCER CENTER ONLY)
Abs Immature Granulocytes: 0.01 10*3/uL (ref 0.00–0.07)
Basophils Absolute: 0 10*3/uL (ref 0.0–0.1)
Basophils Relative: 1 %
Eosinophils Absolute: 0 10*3/uL (ref 0.0–0.5)
Eosinophils Relative: 0 %
HCT: 28.2 % — ABNORMAL LOW (ref 36.0–46.0)
Hemoglobin: 9.7 g/dL — ABNORMAL LOW (ref 12.0–15.0)
Immature Granulocytes: 0 %
Lymphocytes Relative: 27 %
Lymphs Abs: 0.9 10*3/uL (ref 0.7–4.0)
MCH: 31.4 pg (ref 26.0–34.0)
MCHC: 34.4 g/dL (ref 30.0–36.0)
MCV: 91.3 fL (ref 80.0–100.0)
Monocytes Absolute: 0.3 10*3/uL (ref 0.1–1.0)
Monocytes Relative: 8 %
Neutro Abs: 2.1 10*3/uL (ref 1.7–7.7)
Neutrophils Relative %: 64 %
Platelet Count: 184 10*3/uL (ref 150–400)
RBC: 3.09 MIL/uL — ABNORMAL LOW (ref 3.87–5.11)
RDW: 16.5 % — ABNORMAL HIGH (ref 11.5–15.5)
WBC Count: 3.3 10*3/uL — ABNORMAL LOW (ref 4.0–10.5)
nRBC: 0 % (ref 0.0–0.2)

## 2022-06-29 NOTE — Progress Notes (Signed)
Symptom Management Consult note [place]    Patient Care Team: Rowena, Waldemar Dickens, DO as PCP - General (Family Medicine) [name], MD as Consulting Physician (General Surgery) Benay Pike, MD as Consulting Physician (Hematology and Oncology) Kyung Rudd, MD as Consulting Physician (Radiation Oncology) Rockwell Germany, RN as Oncology Nurse Navigator Mauro Kaufmann, RN as Oncology Nurse Navigator    Name of the patient: [name] [name]  [id number]  [birth year]   Date of visit: 06/29/2022   Chief Complaint/Reason for visit: lab recheck   Current Therapy: TCHP with pegfilgrastim  Last treatment:  Day 1   Cycle 3 currently planned for 12/27   ASSESSMENT & PLAN:  Patient is a 46 y.o. female  with oncologic history of malignant neoplasm of upper-outer quadrant of left breast, ER positive followed by Dr. Chryl Heck.  I have viewed most recent oncology note and lab work.   #)Malignant neoplasm of upper-outer quadrant of left breast, ER positive - She is now post 2 cycles of TCHP.  -After last cycle, had AKI, needed IVF with correction of creatinine.  #) Grade 1-2 diarrhea, on Imodium and questran. She used imodium BID, questran as needed. Dose reduced chemo to ensure better tolerance  #)AKI --Clinically patient does not appear dehydrated on exam.  #) Anemia and thrombocytopenia -CBC today shows hemoglobin of 9.7 and platelet count normal today.   No intervention needed at this time.    #) Hypokalemia and hypomagnesemia -  Heme/Onc History: Oncology History  Malignant neoplasm of upper-outer quadrant of left breast in female, estrogen receptor positive ([place])  04/25/2022 Imaging   Patient had diagnostic bilateral mammogram given palpable mass in the left breast.  This showed a 2.6 x 3.5 x 3 cm irregular mass as well as possible satellite mass which is indeterminate.  An ultrasound was recommended.  Ultrasound of the left breast mass showed 3.9 x 2.4  x 2.3 cm irregular mass with an irregular and spiculated margin at 3:00 middle depth 5 cm from the nipple, irregular mass is hypoechoic.  In the left breast at 1 o'clock position, 3 cm from the nipple there is a round mass measuring 0.3 x 0.2 x 0.3 cm.  No significant abnormalities were seen sonographically in the left axilla.  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  Left breast needle core biopsy at 1:00 3 cm from the nipple showed fibrocystic changes with usual ductal hyperplasia negative for malignancy.  Prognostic showed ER 95% PR 40%, HER2 3+ KI of 50%   05/08/2022 Initial Diagnosis   Malignant neoplasm of upper-outer quadrant of left breast in female, estrogen receptor positive ([place])   05/08/2022 Cancer Staging   Staging form: Breast, AJCC 8th Edition - Clinical stage from 05/08/2022: Stage IB (cT2, cN0, cM0, G2, ER+, PR+, HER2+) - Signed by Hayden Pedro, PA-C on 05/08/2022 Stage prefix: Initial diagnosis Method of lymph node assessment: Clinical Histologic grading system: 3 grade system   05/18/2022 -  Chemotherapy   Patient is on Treatment Plan : BREAST  Docetaxel + Carboplatin + Trastuzumab + Pertuzumab  (TCHP) q21d      05/23/2022 Genetic Testing   Negative genetics for [facility] CustomNext-Cancer +RNAinsight Panel.  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 Report  date is 05/23/2022.   The CustomNext-Cancer+RNAinsight panel offered by [facility] includes sequencing and rearrangement analysis for the following 47 genes:  APC, ATM, AXIN2, BARD1, BMPR1A, BRCA1, BRCA2, BRIP1, CDH1, CDK4, CDKN2A, CHEK2, DICER1, EPCAM, GREM1, HOXB13, MEN1, MLH1, MSH2, MSH3, MSH6, MUTYH, NBN, NF1, NF2, NTHL1, PALB2, PMS2, POLD1, POLE, PTEN, RAD51C, RAD51D,  RECQL, RET, SDHA, SDHAF2, SDHB, SDHC, SDHD, SMAD4, SMARCA4, STK11, TP53, TSC1, TSC2, and VHL.  RNA data is routinely analyzed for use in variant interpretation for all genes.       Interval history-: [name] [name] is a 46 y.o. female currently here for follow up. She feels back to baseline. After last Mg, she had a migraine for 3 days, which has now resolved. No vomiting, diarrhea. Mild nausea, didn't have to use any anti emetics. No neuropathy. She can't feel the breast mass anymore.   ROS  All other systems are reviewed and are negative for acute change except as noted in the HPI.    Allergies  Allergen Reactions   Amoxicillin Shortness Of Breath    rash   Coconut (Cocos Nucifera) Anaphylaxis   Penicillins Shortness Of Breath and Rash    Has patient had a PCN reaction causing immediate rash, facial/tongue/throat swelling, SOB or lightheadedness with hypotension: yes Has patient had a PCN reaction causing severe rash involving mucus membranes or skin necrosis: no Has patient had a PCN reaction that required hospitalization no Has patient had a PCN reaction occurring within the last 10 years: no If all of the above answers are "NO", then may proceed with Cephalosporin use.    Duloxetine Other (See Comments)    Caused suicidal thoughts   Hydrocodone Itching   Hydrocodone-Acetaminophen Rash   Moxifloxacin Rash     Past Medical History:  Diagnosis Date   Anemia    history of   Breast cancer, left ([place])    Dyspnea    even with speaking   Family history of adverse reaction to anesthesia    mother slow to awaken   Family history of breast cancer 05/10/2022   Family history of ovarian cancer 05/10/2022   Fibromyalgia    GERD (gastroesophageal reflux disease)    History of kidney stones    last stone 6 months ago    Migraine    Seasonal allergies    Tinnitus    Vertigo      Past Surgical History:  Procedure Laterality Date   ADENOIDECTOMY  1991   CYSTOSCOPY  WITH RETROGRADE PYELOGRAM, URETEROSCOPY AND STENT PLACEMENT Left 02/22/2021   Procedure: [place], URETEROSCOPY AND STENT PLACEMENT;  Surgeon: Remi Haggard, MD;  Location: [facility];  Service: Urology;  Laterality: Left;   CYSTOSCOPY/URETEROSCOPY/HOLMIUM LASER/STENT PLACEMENT Left 03/10/2021   Procedure: CYSTOSCOPY RETROGRADE, LEFT URETEROSCOPY/ STONE BASKETRY [place] LASER/STENT EXCHANGE;  Surgeon: Ardis Hughs, MD;  Location[facility];  Service: Urology;  Laterality: Left;   ESOPHAGOGASTRODUODENOSCOPY N/A 11/17/2013   Procedure: ESOPHAGOGASTRODUODENOSCOPY (EGD);  Surgeon: Milus Banister, MD;  Location: [place];  Service: Endoscopy;  Laterality: N/A;   ESOPHAGOGASTRODUODENOSCOPY N/A 10/15/2015   Procedure: ESOPHAGOGASTRODUODENOSCOPY (EGD);  Surgeon: Gatha Mayer, MD;  Location: [facility];  Service: Endoscopy;  Laterality: N/A;   HOLMIUM LASER APPLICATION  12/13/6810   Procedure: HOLMIUM LASER APPLICATION;  Surgeon: Ardis Hughs, MD;  Location: [facility];  Service: Urology;;   NASAL SEPTOPLASTY W/ TURBINOPLASTY Bilateral 03/18/2018   Procedure: BILATERAL NASAL SEPTOPLASTY WITH TURBINATE REDUCTION;  Surgeon: Jodi Marble,  MD;  Location: [place];  Service: ENT;  Laterality: Bilateral;   PORTACATH PLACEMENT Right 05/17/2022   Procedure: PORT-A-CATH INSERTION WITH ULTRASOUND GUIDANCE;  Surgeon: [name], MD;  Location: [facility];  Service: General;  Laterality: Right;   TONSILLECTOMY  1991   TUBAL LIGATION  2005    Social History   Socioeconomic History   Marital status: Single    Spouse name: Not on file   Number of children: Not on file   Years of education: Not on file   Highest education level: Not on file  Occupational History   Not on file  Tobacco Use   Smoking status: Never   Smokeless tobacco: Never  Vaping Use   Vaping Use: Never used  Substance and Sexual Activity    Alcohol use: No   Drug use: No   Sexual activity: Not on file  Other Topics Concern   Not on file  Social History Narrative   ** Merged History Encounter **       Social Determinants of Health   Financial Resource Strain: Low Risk  (05/10/2022)   Overall Financial Resource Strain (CARDIA)    Difficulty of Paying Living Expenses: Not very hard  Food Insecurity: No Food Insecurity (05/10/2022)   Hunger Vital Sign    Worried About Running Out of Food in the Last Year: Never true    Ran Out of Food in the Last Year: Never true  Transportation Needs: No Transportation Needs (05/10/2022)   PRAPARE - Hydrologist (Medical): No    Lack of Transportation (Non-Medical): No  Physical Activity: Not on file  Stress: Not on file  Social Connections: Not on file  Intimate Partner Violence: Not on file    Family History  Problem Relation Age of Onset   Asthma Mother    Diabetes Mother    Colon polyps Mother    Ovarian cancer Maternal Grandmother 37   Diabetes Maternal Grandmother    Esophageal cancer Maternal Grandfather        d. 34   Breast cancer Other        MGM's sisters x2   Cervical cancer Cousin        mat cousin; dx <40     Current Outpatient Medications:    acetaminophen (TYLENOL) 500 MG tablet, Take 1,000 mg by mouth every 8 (eight) hours as needed for headache., Disp: , Rfl:    cholestyramine (QUESTRAN) 4 g packet, TAKE 1 PACKET (4 G TOTAL) BY MOUTH 2 TIMES DAILY., Disp: 180 packet, Rfl: 1   dexamethasone (DECADRON) 4 MG tablet, Take 2 tabs by mouth 2 times daily starting day before chemo. Then take 2 tabs daily for 2 days starting day after chemo. Take with food., Disp: 30 tablet, Rfl: 1   diphenoxylate-atropine (LOMOTIL) 2.5-0.025 MG tablet, Take 1 tablet by mouth 4 (four) times daily as needed for diarrhea or loose stools., Disp: 30 tablet, Rfl: 0   lidocaine-prilocaine (EMLA) cream, Apply to affected area once, Disp: 30 g, Rfl: 3    ondansetron (ZOFRAN) 8 MG tablet, Take 1 tablet (8 mg total) by mouth every 8 (eight) hours as needed for nausea or vomiting. Start on the third day after chemotherapy., Disp: 30 tablet, Rfl: 1   potassium chloride SA (KLOR-CON M) 20 MEQ tablet, Take 1 tablet (20 mEq total) by mouth 2 (two) times daily for 7 days., Disp: 14 tablet, Rfl: 0   prochlorperazine (COMPAZINE) 10 MG tablet, Take  1 tablet (10 mg total) by mouth every 6 (six) hours as needed for nausea or vomiting., Disp: 30 tablet, Rfl: 1   traMADol (ULTRAM) 50 MG tablet, Take 1 tablet (50 mg total) by mouth every 6 (six) hours as needed for moderate pain or severe pain., Disp: 25 tablet, Rfl: 0  PHYSICAL EXAM: ECOG FS:1 - Symptomatic but completely ambulatory    Vitals:   06/29/22 0900  BP: 133/87  Pulse: 86  Resp: 16  Temp: 97.7 F (36.5 C)  TempSrc: Temporal  SpO2: 99%  Weight: 187 lb 1.6 oz (84.9 kg)  Height: _0  (1.727 m)   Physical Exam Vitals and nursing note reviewed.  Constitutional:      Appearance: She is well-developed. She is not ill-appearing or toxic-appearing.  HENT:     Head: Normocephalic.     Nose: Nose normal.  Eyes:     Conjunctiva/sclera: Conjunctivae normal.  Neck:     Vascular: No JVD.  Cardiovascular:     Rate and Rhythm: Normal rate and regular rhythm.     Pulses: Normal pulses.     Heart sounds: Normal heart sounds.  Pulmonary:     Effort: Pulmonary effort is normal.     Breath sounds: Normal breath sounds.  Chest:       Comments: PAC in right upper chest.  No surrounding erythema or tenderness.  No signs of infection. Left breast mass with some abnormal density in the upper outer quadrant. Remarkable response so far. Some bruising from recent biopsy  Abdominal:     General: There is no distension.  Musculoskeletal:     Cervical back: Normal range of motion.  Skin:    General: Skin is warm and dry.  Neurological:     Mental Status: She is oriented to person, place, and time.         LABORATORY DATA: I have reviewed the data as listed    Latest Ref Rng & Units 06/29/2022    8:46 AM 06/19/2022    9:01 AM 06/16/2022    9:05 AM  CBC  WBC 4.0 - 10.5 K/uL 3.3  7.4  5.0   Hemoglobin 12.0 - 15.0 g/dL 9.7  9.7  10.5   Hematocrit 36.0 - 46.0 % 28.2  27.0  28.8   Platelets 150 - 400 K/uL 184  81  89         Latest Ref Rng & Units 06/29/2022    8:46 AM 06/19/2022    9:01 AM 06/16/2022    9:05 AM  CMP  Glucose 70 - 99 mg/dL 123  104  100   BUN 6 - 20 mg/dL _1 Creatinine 0.44 - 1.00 mg/dL 1.23  1.20  1.55   Sodium 135 - 145 mmol/L 143  140  139   Potassium 3.5 - 5.1 mmol/L 3.7  3.0  3.0   Chloride 98 - 111 mmol/L 106  106  102   CO2 22 - 32 mmol/L _2 Calcium 8.9 - 10.3 mg/dL 9.2  8.9  9.3   Total Protein 6.5 - 8.1 g/dL 6.2  6.0  6.5   Total Bilirubin 0.3 - 1.2 mg/dL 0.4  0.4  0.7   Alkaline Phos 38 - 126 U/L 61  81  82   AST 15 - 41 U/L _3 ALT 0 - 44 U/L 27  33  44  RADIOGRAPHIC STUDIES (from last 24 hours if applicable) I have personally reviewed the radiological images as listed and agreed with the findings in the report. No results found.    Orders Placed This Encounter  Procedures   CBC with Differential ([place] Only)    Standing Status:   Future    Standing Expiration Date:   08/17/2023   CMP ([place] only)    Standing Status:   Future    Standing Expiration Date:   08/17/2023    All questions were answered. The patient knows to call the clinic with any problems, questions or concerns. No barriers to learning was detected.  I have spent a total of 30 minutes minutes of face-to-face and non-face-to-face time, preparing to see the patient, obtaining and/or reviewing separately obtained history, performing a medically appropriate examination, counseling and educating the patient, ordering tests, documenting clinical information in the electronic health record, and care coordination (communications  with other health care professionals or caregivers).    Benay Pike, MD Department of Hematology/Oncology [facility] Phone: [phone]  [phone]    06/29/2022 9:24 AM

## 2022-07-01 ENCOUNTER — Ambulatory Visit: Payer: 59

## 2022-07-04 ENCOUNTER — Telehealth: Payer: Self-pay | Admitting: *Deleted

## 2022-07-04 NOTE — Telephone Encounter (Signed)
Pt called stating that spouse just tested positive for COVID today 12/26. Pt did say she hadn't tested yet. Advised to test for COVID and let us know results. Went through protocol for positive COVID pts. Pt verbalized understanding and will call after test results.

## 2022-07-05 ENCOUNTER — Inpatient Hospital Stay: Payer: 59 | Admitting: Adult Health

## 2022-07-05 ENCOUNTER — Inpatient Hospital Stay: Payer: 59

## 2022-07-05 ENCOUNTER — Other Ambulatory Visit: Payer: Self-pay | Admitting: *Deleted

## 2022-07-05 DIAGNOSIS — Z170 Estrogen receptor positive status [ER+]: Secondary | ICD-10-CM

## 2022-07-06 ENCOUNTER — Encounter: Payer: Self-pay | Admitting: Hematology and Oncology

## 2022-07-06 ENCOUNTER — Other Ambulatory Visit: Payer: Self-pay

## 2022-07-06 ENCOUNTER — Inpatient Hospital Stay: Payer: 59

## 2022-07-07 ENCOUNTER — Inpatient Hospital Stay: Payer: 59

## 2022-07-07 ENCOUNTER — Inpatient Hospital Stay (HOSPITAL_BASED_OUTPATIENT_CLINIC_OR_DEPARTMENT_OTHER): Payer: 59 | Admitting: Oncology

## 2022-07-07 VITALS — BP 137/94 | HR 82 | Temp 98.8°F | Resp 17 | Ht 68.0 in | Wt 183.3 lb

## 2022-07-07 DIAGNOSIS — Z8041 Family history of malignant neoplasm of ovary: Secondary | ICD-10-CM | POA: Diagnosis not present

## 2022-07-07 DIAGNOSIS — E860 Dehydration: Secondary | ICD-10-CM | POA: Diagnosis not present

## 2022-07-07 DIAGNOSIS — C50412 Malignant neoplasm of upper-outer quadrant of left female breast: Secondary | ICD-10-CM

## 2022-07-07 DIAGNOSIS — D649 Anemia, unspecified: Secondary | ICD-10-CM | POA: Diagnosis not present

## 2022-07-07 DIAGNOSIS — Z803 Family history of malignant neoplasm of breast: Secondary | ICD-10-CM | POA: Diagnosis not present

## 2022-07-07 DIAGNOSIS — N179 Acute kidney failure, unspecified: Secondary | ICD-10-CM | POA: Diagnosis not present

## 2022-07-07 DIAGNOSIS — Z170 Estrogen receptor positive status [ER+]: Secondary | ICD-10-CM

## 2022-07-07 DIAGNOSIS — E876 Hypokalemia: Secondary | ICD-10-CM | POA: Diagnosis not present

## 2022-07-07 DIAGNOSIS — Z8049 Family history of malignant neoplasm of other genital organs: Secondary | ICD-10-CM | POA: Diagnosis not present

## 2022-07-07 DIAGNOSIS — T451X5A Adverse effect of antineoplastic and immunosuppressive drugs, initial encounter: Secondary | ICD-10-CM | POA: Diagnosis not present

## 2022-07-07 DIAGNOSIS — R197 Diarrhea, unspecified: Secondary | ICD-10-CM | POA: Diagnosis not present

## 2022-07-07 DIAGNOSIS — D708 Other neutropenia: Secondary | ICD-10-CM | POA: Diagnosis not present

## 2022-07-07 DIAGNOSIS — Z800 Family history of malignant neoplasm of digestive organs: Secondary | ICD-10-CM | POA: Diagnosis not present

## 2022-07-07 DIAGNOSIS — Z5189 Encounter for other specified aftercare: Secondary | ICD-10-CM | POA: Diagnosis not present

## 2022-07-07 DIAGNOSIS — D6959 Other secondary thrombocytopenia: Secondary | ICD-10-CM | POA: Diagnosis not present

## 2022-07-07 DIAGNOSIS — Z5111 Encounter for antineoplastic chemotherapy: Secondary | ICD-10-CM | POA: Diagnosis not present

## 2022-07-07 DIAGNOSIS — Z5112 Encounter for antineoplastic immunotherapy: Secondary | ICD-10-CM | POA: Diagnosis not present

## 2022-07-07 DIAGNOSIS — D696 Thrombocytopenia, unspecified: Secondary | ICD-10-CM | POA: Diagnosis not present

## 2022-07-07 LAB — CMP (CANCER CENTER ONLY)
ALT: 13 U/L (ref 0–44)
AST: 15 U/L (ref 15–41)
Albumin: 3.9 g/dL (ref 3.5–5.0)
Alkaline Phosphatase: 56 U/L (ref 38–126)
Anion gap: 6 (ref 5–15)
BUN: 11 mg/dL (ref 6–20)
CO2: 31 mmol/L (ref 22–32)
Calcium: 9.3 mg/dL (ref 8.9–10.3)
Chloride: 104 mmol/L (ref 98–111)
Creatinine: 1.14 mg/dL — ABNORMAL HIGH (ref 0.44–1.00)
GFR, Estimated: >60 mL/min (ref >60)
Glucose, Bld: 95 mg/dL (ref 70–99)
Potassium: 3.2 mmol/L — ABNORMAL LOW (ref 3.5–5.1)
Sodium: 141 mmol/L (ref 135–145)
Total Bilirubin: 0.6 mg/dL (ref 0.3–1.2)
Total Protein: 6.7 g/dL (ref 6.5–8.1)

## 2022-07-07 LAB — CBC WITH DIFFERENTIAL (CANCER CENTER ONLY)
Abs Immature Granulocytes: 0.01 10*3/uL (ref 0.00–0.07)
Basophils Absolute: 0 10*3/uL (ref 0.0–0.1)
Basophils Relative: 1 %
Eosinophils Absolute: 0.1 10*3/uL (ref 0.0–0.5)
Eosinophils Relative: 2 %
HCT: 28.4 % — ABNORMAL LOW (ref 36.0–46.0)
Hemoglobin: 10.2 g/dL — ABNORMAL LOW (ref 12.0–15.0)
Immature Granulocytes: 0 %
Lymphocytes Relative: 38 %
Lymphs Abs: 1.2 10*3/uL (ref 0.7–4.0)
MCH: 32.6 pg (ref 26.0–34.0)
MCHC: 35.9 g/dL (ref 30.0–36.0)
MCV: 90.7 fL (ref 80.0–100.0)
Monocytes Absolute: 0.3 10*3/uL (ref 0.1–1.0)
Monocytes Relative: 10 %
Neutro Abs: 1.6 10*3/uL — ABNORMAL LOW (ref 1.7–7.7)
Neutrophils Relative %: 49 %
Platelet Count: 182 10*3/uL (ref 150–400)
RBC: 3.13 MIL/uL — ABNORMAL LOW (ref 3.87–5.11)
RDW: 16.3 % — ABNORMAL HIGH (ref 11.5–15.5)
WBC Count: 3.3 10*3/uL — ABNORMAL LOW (ref 4.0–10.5)
nRBC: 0 % (ref 0.0–0.2)

## 2022-07-07 MED ORDER — SODIUM CHLORIDE 0.9% FLUSH
10.0000 mL | INTRAVENOUS | Status: DC | PRN
  Administered 2022-07-07: 10 mL

## 2022-07-07 MED ORDER — SODIUM CHLORIDE 0.9 % IV SOLN: Freq: Once | INTRAVENOUS | Status: AC

## 2022-07-07 MED ORDER — DIPHENHYDRAMINE HCL 25 MG PO CAPS
50.0000 mg | ORAL_CAPSULE | Freq: Once | ORAL | Status: AC
  Administered 2022-07-07: 50 mg via ORAL
  Filled 2022-07-07: qty 2

## 2022-07-07 MED ORDER — ACETAMINOPHEN 325 MG PO TABS
650.0000 mg | ORAL_TABLET | Freq: Once | ORAL | Status: AC
  Administered 2022-07-07: 650 mg via ORAL
  Filled 2022-07-07: qty 2

## 2022-07-07 MED ORDER — SODIUM CHLORIDE 0.9 % IV SOLN
10.0000 mg | Freq: Once | INTRAVENOUS | Status: AC
  Administered 2022-07-07: 10 mg via INTRAVENOUS
  Filled 2022-07-07: qty 10

## 2022-07-07 MED ORDER — SODIUM CHLORIDE 0.9 % IV SOLN
50.0000 mg/m2 | Freq: Once | INTRAVENOUS | Status: AC
  Administered 2022-07-07: 100 mg via INTRAVENOUS
  Filled 2022-07-07: qty 10

## 2022-07-07 MED ORDER — SODIUM CHLORIDE 0.9 % IV SOLN
150.0000 mg | Freq: Once | INTRAVENOUS | Status: AC
  Administered 2022-07-07: 150 mg via INTRAVENOUS
  Filled 2022-07-07: qty 150

## 2022-07-07 MED ORDER — SODIUM CHLORIDE 0.9 % IV SOLN
420.0000 mg | Freq: Once | INTRAVENOUS | Status: AC
  Administered 2022-07-07: 420 mg via INTRAVENOUS
  Filled 2022-07-07: qty 14

## 2022-07-07 MED ORDER — HEPARIN SOD (PORK) LOCK FLUSH 100 UNIT/ML IV SOLN
500.0000 [IU] | Freq: Once | INTRAVENOUS | Status: AC | PRN
  Administered 2022-07-07: 500 [IU]

## 2022-07-07 MED ORDER — POTASSIUM CHLORIDE CRYS ER 20 MEQ PO TBCR: 20.0000 meq | EXTENDED_RELEASE_TABLET | Freq: Every day | ORAL | 0 refills | Status: DC

## 2022-07-07 MED ORDER — TRASTUZUMAB-ANNS CHEMO 150 MG IV SOLR
8.0000 mg/kg | Freq: Once | INTRAVENOUS | Status: AC
  Administered 2022-07-07: 693 mg via INTRAVENOUS
  Filled 2022-07-07: qty 33

## 2022-07-07 MED ORDER — PALONOSETRON HCL INJECTION 0.25 MG/5ML
0.2500 mg | Freq: Once | INTRAVENOUS | Status: AC
  Administered 2022-07-07: 0.25 mg via INTRAVENOUS
  Filled 2022-07-07: qty 5

## 2022-07-07 MED ORDER — SODIUM CHLORIDE 0.9 % IV SOLN
556.0000 mg | Freq: Once | INTRAVENOUS | Status: AC
  Administered 2022-07-07: 560 mg via INTRAVENOUS
  Filled 2022-07-07: qty 56

## 2022-07-07 NOTE — Progress Notes (Signed)
Symptom Management Consult note [place]    Patient Care Team: [place], Waldemar Dickens, DO as PCP - General (Family Medicine) [name], MD as Consulting Physician (General Surgery) Benay Pike, MD as Consulting Physician (Hematology and Oncology) Kyung Rudd, MD as Consulting Physician (Radiation Oncology) Rockwell Germany, RN as Oncology Nurse Navigator Mauro Kaufmann, RN as Oncology Nurse Navigator    Name of the patient: [name] [name]  [id number]  1975-08-16   Date of visit: 07/07/2022   Chief Complaint/Reason for visit: Evaluation prior to cycle #3 of chemotherapy   Current Therapy: TCHP with pegfilgrastim  Last treatment:  Day 1   Cycle 3    ASSESSMENT & PLAN:  Patient is a 46 y.o. female  with oncologic history of malignant neoplasm of upper-outer quadrant of left breast, ER positive followed by Dr. Chryl Heck.  I have viewed most recent oncology note and lab work.   #)Malignant neoplasm of upper-outer quadrant of left breast, ER positive -Proceed with cycle #3 chemotherapy today as scheduled. -After last cycle, had AKI, needed IVF with correction of creatinine. -She will require IV fluids following her chemotherapy.  #) Grade 1-2 diarrhea, on Imodium and questran. She used imodium BID, questran as needed. Dose reduced chemo to ensure better tolerance  #)AKI --Clinically patient does not appear dehydrated on exam. -Renal function improving  #) Anemia and thrombocytopenia -CBC today shows hemoglobin of 10.2 with a normal platelet count.   No intervention needed at this time.    #) Hypokalemia and hypomagnesemia -Potassium level 3.2 today.  Will give K-Dur 20 mEq daily x 7 days.  Heme/Onc History: Oncology History  Malignant neoplasm of upper-outer quadrant of left breast in female, estrogen receptor positive ([place])  04/25/2022 Imaging   Patient had diagnostic bilateral mammogram given palpable mass in the left breast.  This showed a  2.6 x 3.5 x 3 cm irregular mass as well as possible satellite mass which is indeterminate.  An ultrasound was recommended.  Ultrasound of the left breast mass showed 3.9 x 2.4 x 2.3 cm irregular mass with an irregular and spiculated margin at 3:00 middle depth 5 cm from the nipple, irregular mass is hypoechoic.  In the left breast at 1 o'clock position, 3 cm from the nipple there is a round mass measuring 0.3 x 0.2 x 0.3 cm.  No significant abnormalities were seen sonographically in the left axilla.  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  Left breast needle core biopsy at 1:00 3 cm from the nipple showed fibrocystic changes with usual ductal hyperplasia negative for malignancy.  Prognostic showed ER 95% PR 40%, HER2 3+ KI of 50%   05/08/2022 Initial Diagnosis   Malignant neoplasm of upper-outer quadrant of left breast in female, estrogen receptor positive ([place])   05/08/2022 Cancer Staging   Staging form: Breast, AJCC 8th Edition - Clinical stage from 05/08/2022: Stage IB (cT2, cN0, cM0, G2, ER+, PR+, HER2+) - Signed by Hayden Pedro, PA-C on 05/08/2022 Stage prefix: Initial diagnosis Method of lymph node assessment: Clinical Histologic grading system: 3 grade system   05/18/2022 -  Chemotherapy   Patient is on Treatment Plan : BREAST  Docetaxel + Carboplatin + Trastuzumab + Pertuzumab  (TCHP) q21d  05/23/2022 Genetic Testing   Negative genetics for [facility] CustomNext-Cancer +RNAinsight Panel.  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 Report date is 05/23/2022.   The CustomNext-Cancer+RNAinsight panel offered by [facility] includes sequencing and rearrangement analysis for the following 47 genes:  APC, ATM, AXIN2, BARD1, BMPR1A, BRCA1, BRCA2,  BRIP1, CDH1, CDK4, CDKN2A, CHEK2, DICER1, EPCAM, GREM1, HOXB13, MEN1, MLH1, MSH2, MSH3, MSH6, MUTYH, NBN, NF1, NF2, NTHL1, PALB2, PMS2, POLD1, POLE, PTEN, RAD51C, RAD51D, RECQL, RET, SDHA, SDHAF2, SDHB, SDHC, SDHD, SMAD4, SMARCA4, STK11, TP53, TSC1, TSC2, and VHL.  RNA data is routinely analyzed for use in variant interpretation for all genes.       Interval history-: [name] [name] is a 46 y.o. female currently here for follow up. Had an extra week off due to holiday.  Overall, feels well. Reports mild, intermittent nausea.  No vomiting. No diarrhea. No neuropathy reported   ROS  All other systems are reviewed and are negative for acute change except as noted in the HPI.    Allergies  Allergen Reactions   Amoxicillin Shortness Of Breath    rash   Coconut (Cocos Nucifera) Anaphylaxis   Penicillins Shortness Of Breath and Rash    Has patient had a PCN reaction causing immediate rash, facial/tongue/throat swelling, SOB or lightheadedness with hypotension: yes Has patient had a PCN reaction causing severe rash involving mucus membranes or skin necrosis: no Has patient had a PCN reaction that required hospitalization no Has patient had a PCN reaction occurring within the last 10 years: no If all of the above answers are "NO", then may proceed with Cephalosporin use.    Duloxetine Other (See Comments)    Caused suicidal thoughts   Hydrocodone Itching   Hydrocodone-Acetaminophen Rash   Moxifloxacin Rash     Past Medical History:  Diagnosis Date   Anemia    history of   Breast cancer, left ([place])    Dyspnea    even with speaking   Family history of adverse reaction to anesthesia    mother slow to awaken   Family history of breast cancer 05/10/2022   Family history of ovarian cancer 05/10/2022   Fibromyalgia    GERD (gastroesophageal reflux disease)    History of kidney stones    last stone 6 months ago    Migraine    Seasonal allergies    Tinnitus    Vertigo       Past Surgical History:  Procedure Laterality Date   ADENOIDECTOMY  1991   CYSTOSCOPY WITH RETROGRADE PYELOGRAM, URETEROSCOPY AND STENT PLACEMENT Left 02/22/2021   Procedure: [place], URETEROSCOPY AND STENT PLACEMENT;  Surgeon: Remi Haggard, MD;  Location: [facility];  Service: Urology;  Laterality: Left;   CYSTOSCOPY/URETEROSCOPY/HOLMIUM LASER/STENT PLACEMENT Left 03/10/2021   Procedure: CYSTOSCOPY RETROGRADE, LEFT URETEROSCOPY/ STONE BASKETRY [place] LASER/STENT EXCHANGE;  Surgeon: Ardis Hughs, MD;  Location: [facility];  Service: Urology;  Laterality: Left;   ESOPHAGOGASTRODUODENOSCOPY N/A 11/17/2013   Procedure: ESOPHAGOGASTRODUODENOSCOPY (EGD);  Surgeon: Milus Banister, MD;  Location: [facility];  Service: Endoscopy;  Laterality: N/A;   ESOPHAGOGASTRODUODENOSCOPY N/A 10/15/2015   Procedure: ESOPHAGOGASTRODUODENOSCOPY (EGD);  Surgeon: Gatha Mayer, MD;  Location[facility];  Service: Endoscopy;  Laterality: N/A;   HOLMIUM LASER APPLICATION  10/14/8293   Procedure: HOLMIUM LASER APPLICATION;  Surgeon: Ardis Hughs, MD;  Location: [facility];  Service: Urology;;   NASAL SEPTOPLASTY W/ TURBINOPLASTY Bilateral 03/18/2018   Procedure: BILATERAL NASAL SEPTOPLASTY WITH TURBINATE REDUCTION;  Surgeon: Jodi Marble, MD;  Location: [facility];  Service: ENT;  Laterality: Bilateral;   PORTACATH PLACEMENT Right 05/17/2022   Procedure: PORT-A-CATH INSERTION WITH ULTRASOUND GUIDANCE;  Surgeon: [name], MD;  Location: [facility];  Service: General;  Laterality: Right;   TONSILLECTOMY  1991   TUBAL LIGATION  2005    Social History   Socioeconomic History   Marital status: Single    Spouse name: Not on file   Number of children: Not on file   Years of education: Not on file   Highest education level: Not on file  Occupational History   Not on file  Tobacco Use   Smoking status: Never    Smokeless tobacco: Never  Vaping Use   Vaping Use: Never used  Substance and Sexual Activity   Alcohol use: No   Drug use: No   Sexual activity: Not on file  Other Topics Concern   Not on file  Social History Narrative   ** Merged History Encounter **       Social Determinants of Health   Financial Resource Strain: Low Risk  (05/10/2022)   Overall Financial Resource Strain (CARDIA)    Difficulty of Paying Living Expenses: Not very hard  Food Insecurity: No Food Insecurity (05/10/2022)   Hunger Vital Sign    Worried About Running Out of Food in the Last Year: Never true    Ran Out of Food in the Last Year: Never true  Transportation Needs: No Transportation Needs (05/10/2022)   PRAPARE - Hydrologist (Medical): No    Lack of Transportation (Non-Medical): No  Physical Activity: Not on file  Stress: Not on file  Social Connections: Not on file  Intimate Partner Violence: Not on file    Family History  Problem Relation Age of Onset   Asthma Mother    Diabetes Mother    Colon polyps Mother    Ovarian cancer Maternal Grandmother 82   Diabetes Maternal Grandmother    Esophageal cancer Maternal Grandfather        d. 69   Breast cancer Other        MGM's sisters x2   Cervical cancer Cousin        mat cousin; dx <40     Current Outpatient Medications:    acetaminophen (TYLENOL) 500 MG tablet, Take 1,000 mg by mouth every 8 (eight) hours as needed for headache., Disp: , Rfl:    cholestyramine (QUESTRAN) 4 g packet, TAKE 1 PACKET (4 G TOTAL) BY MOUTH 2 TIMES DAILY., Disp: 180 packet, Rfl: 1   dexamethasone (DECADRON) 4 MG tablet, Take 2 tabs by mouth 2 times daily starting day before chemo. Then take 2 tabs daily for 2 days starting day after chemo. Take with food., Disp: 30 tablet, Rfl: 1   diphenoxylate-atropine (LOMOTIL) 2.5-0.025 MG tablet, Take 1 tablet by mouth 4 (four) times daily as needed for diarrhea or loose stools., Disp: 30 tablet, Rfl:  0   lidocaine-prilocaine (EMLA) cream, Apply to affected area once, Disp: 30 g, Rfl: 3   ondansetron (ZOFRAN) 8 MG tablet, Take 1 tablet (8 mg total) by mouth every 8 (eight) hours as needed for nausea or vomiting. Start on the third day after chemotherapy., Disp: 30 tablet, Rfl: 1   potassium chloride SA (KLOR-CON M) 20 MEQ tablet, Take 1 tablet (20 mEq total) by mouth 2 (two) times daily for 7 days., Disp: 14 tablet, Rfl: 0   prochlorperazine (COMPAZINE) 10  MG tablet, Take 1 tablet (10 mg total) by mouth every 6 (six) hours as needed for nausea or vomiting., Disp: 30 tablet, Rfl: 1   traMADol (ULTRAM) 50 MG tablet, Take 1 tablet (50 mg total) by mouth every 6 (six) hours as needed for moderate pain or severe pain., Disp: 25 tablet, Rfl: 0  PHYSICAL EXAM: ECOG FS:1 - Symptomatic but completely ambulatory    Vitals:   07/07/22 0839  BP: (!) 137/94  Pulse: 82  Resp: 17  Temp: 98.8 F (37.1 C)  TempSrc: Oral  SpO2: 99%  Weight: 83.1 kg  Height: 5' 8" (1.727 m)   Physical Exam Vitals and nursing note reviewed.  Constitutional:      Appearance: She is well-developed. She is not ill-appearing or toxic-appearing.  HENT:     Head: Normocephalic.     Nose: Nose normal.  Eyes:     Conjunctiva/sclera: Conjunctivae normal.  Neck:     Vascular: No JVD.  Cardiovascular:     Rate and Rhythm: Normal rate and regular rhythm.     Pulses: Normal pulses.     Heart sounds: Normal heart sounds.  Pulmonary:     Effort: Pulmonary effort is normal.     Breath sounds: Normal breath sounds.  Chest:       Comments: PAC in right upper chest.  No surrounding erythema or tenderness.  No signs of infection.  Abdominal:     General: There is no distension.  Musculoskeletal:     Cervical back: Normal range of motion.  Skin:    General: Skin is warm and dry.  Neurological:     Mental Status: She is oriented to person, place, and time.       LABORATORY DATA: I have reviewed the data as  listed    Latest Ref Rng & Units 06/29/2022    8:46 AM 06/19/2022    9:01 AM 06/16/2022    9:05 AM  CBC  WBC 4.0 - 10.5 K/uL 3.3  7.4  5.0   Hemoglobin 12.0 - 15.0 g/dL 9.7  9.7  10.5   Hematocrit 36.0 - 46.0 % 28.2  27.0  28.8   Platelets 150 - 400 K/uL 184  81  89         Latest Ref Rng & Units 06/29/2022    8:46 AM 06/19/2022    9:01 AM 06/16/2022    9:05 AM  CMP  Glucose 70 - 99 mg/dL 123  104  100   BUN 6 - 20 mg/dL _0 Creatinine 0.44 - 1.00 mg/dL 1.23  1.20  1.55   Sodium 135 - 145 mmol/L 143  140  139   Potassium 3.5 - 5.1 mmol/L 3.7  3.0  3.0   Chloride 98 - 111 mmol/L 106  106  102   CO2 22 - 32 mmol/L _1 Calcium 8.9 - 10.3 mg/dL 9.2  8.9  9.3   Total Protein 6.5 - 8.1 g/dL 6.2  6.0  6.5   Total Bilirubin 0.3 - 1.2 mg/dL 0.4  0.4  0.7   Alkaline Phos 38 - 126 U/L 61  81  82   AST 15 - 41 U/L _2 ALT 0 - 44 U/L 27  33  44        RADIOGRAPHIC STUDIES (from last 24 hours if applicable) I have personally reviewed the radiological images as listed and agreed with  the findings in the report. No results found.    No orders of the defined types were placed in this encounter.  Mikey Bussing, NP Department of Hematology/Oncology [facility] Phone: [phone]  [phone]    07/07/2022 8:41 AM

## 2022-07-07 NOTE — Patient Instructions (Signed)
[facility]  Discharge Instructions: Thank you for choosing [facility] to provide your oncology and hematology care.   If you have a lab appointment with the [facility], please go directly to the [facility] and check in at the registration area.   Wear comfortable clothing and clothing appropriate for easy access to any Portacath or PICC line.   We strive to give you quality time with your provider. You may need to reschedule your appointment if you arrive late (15 or more minutes).  Arriving late affects you and other patients whose appointments are after yours.  Also, if you miss three or more appointments without notifying the office, you may be dismissed from the clinic at the provider's discretion.      For prescription refill requests, have your pharmacy contact our office and allow 72 hours for refills to be completed.    Today you received the following chemotherapy and/or immunotherapy agents : Trastuzumab, pertuzumab, taxotere, carboplatin      To help prevent nausea and vomiting after your treatment, we encourage you to take your nausea medication as directed.  BELOW ARE SYMPTOMS THAT SHOULD BE REPORTED IMMEDIATELY: *FEVER GREATER THAN 100.4 F (38 C) OR HIGHER *CHILLS OR SWEATING *NAUSEA AND VOMITING THAT IS NOT CONTROLLED WITH YOUR NAUSEA MEDICATION *UNUSUAL SHORTNESS OF BREATH *UNUSUAL BRUISING OR BLEEDING *URINARY PROBLEMS (pain or burning when urinating, or frequent urination) *BOWEL PROBLEMS (unusual diarrhea, constipation, pain near the anus) TENDERNESS IN MOUTH AND THROAT WITH OR WITHOUT PRESENCE OF ULCERS (sore throat, sores in mouth, or a toothache) UNUSUAL RASH, SWELLING OR PAIN  UNUSUAL VAGINAL DISCHARGE OR ITCHING   Items with * indicate a potential emergency and should be followed up as soon as possible or go to the Emergency Department if any problems should occur.  Please show the CHEMOTHERAPY ALERT CARD or  IMMUNOTHERAPY ALERT CARD at check-in to the Emergency Department and triage nurse.  Should you have questions after your visit or need to cancel or reschedule your appointment, please contact [facility]  Dept: [phone]  and follow the prompts.  Office hours are 8:00 a.m. to 4:30 p.m. Monday - Friday. Please note that voicemails left after 4:00 p.m. may not be returned until the following business day.  We are closed weekends and major holidays. You have access to a nurse at all times for urgent questions. Please call the main number to the clinic Dept: [phone] and follow the prompts.   For any non-urgent questions, you may also contact your provider using MyChart. We now offer e-Visits for anyone 40 and older to request care online for non-urgent symptoms. For details visit [email or web address].   Also download the MyChart app! Go to the app store, search "MyChart", open the app, select , and log in with your MyChart username and password.  Masks are optional in the cancer centers. If you would like for your care team to wear a mask while they are taking care of you, please let them know. You may have one support person who is at least 44 years old accompany you for your appointments.

## 2022-07-07 NOTE — Progress Notes (Signed)
Per Mendel Ryder, NP, we will reload Kanjinti today since the patient has had a delay of more than week since the last cycle. Will increase today's dose to '8mg'$ /kg.   Laray Anger, PharmD PGY-2 Pharmacy Resident Hematology/Oncology [phone]  07/07/2022 10:03 AM

## 2022-07-08 ENCOUNTER — Inpatient Hospital Stay: Payer: 59

## 2022-07-11 ENCOUNTER — Inpatient Hospital Stay: Payer: 59

## 2022-07-11 ENCOUNTER — Encounter: Payer: Self-pay | Admitting: Hematology and Oncology

## 2022-07-12 ENCOUNTER — Other Ambulatory Visit: Payer: Self-pay | Admitting: Adult Health

## 2022-07-12 ENCOUNTER — Inpatient Hospital Stay: Payer: 59 | Attending: Hematology and Oncology

## 2022-07-12 VITALS — BP 127/69 | HR 107 | Temp 98.1°F | Resp 16

## 2022-07-12 DIAGNOSIS — Z5189 Encounter for other specified aftercare: Secondary | ICD-10-CM | POA: Insufficient documentation

## 2022-07-12 DIAGNOSIS — D696 Thrombocytopenia, unspecified: Secondary | ICD-10-CM | POA: Diagnosis not present

## 2022-07-12 DIAGNOSIS — D649 Anemia, unspecified: Secondary | ICD-10-CM | POA: Diagnosis not present

## 2022-07-12 DIAGNOSIS — C50412 Malignant neoplasm of upper-outer quadrant of left female breast: Secondary | ICD-10-CM | POA: Insufficient documentation

## 2022-07-12 DIAGNOSIS — E876 Hypokalemia: Secondary | ICD-10-CM | POA: Diagnosis not present

## 2022-07-12 DIAGNOSIS — Z170 Estrogen receptor positive status [ER+]: Secondary | ICD-10-CM | POA: Diagnosis not present

## 2022-07-12 DIAGNOSIS — Z5112 Encounter for antineoplastic immunotherapy: Secondary | ICD-10-CM | POA: Diagnosis not present

## 2022-07-12 DIAGNOSIS — Z5111 Encounter for antineoplastic chemotherapy: Secondary | ICD-10-CM | POA: Diagnosis not present

## 2022-07-12 MED ORDER — DIPHENOXYLATE-ATROPINE 2.5-0.025 MG PO TABS: 1.0000 | ORAL_TABLET | Freq: Four times a day (QID) | ORAL | 0 refills | Status: DC | PRN

## 2022-07-12 MED ORDER — PEGFILGRASTIM-JMDB 6 MG/0.6ML ~~LOC~~ SOSY
6.0000 mg | PREFILLED_SYRINGE | Freq: Once | SUBCUTANEOUS | Status: AC
  Administered 2022-07-12: 6 mg via SUBCUTANEOUS

## 2022-07-13 ENCOUNTER — Inpatient Hospital Stay: Payer: 59

## 2022-07-13 ENCOUNTER — Other Ambulatory Visit: Payer: Self-pay

## 2022-07-13 VITALS — BP 118/72 | HR 81 | Temp 97.9°F | Resp 16

## 2022-07-13 DIAGNOSIS — Z5189 Encounter for other specified aftercare: Secondary | ICD-10-CM | POA: Diagnosis not present

## 2022-07-13 DIAGNOSIS — E876 Hypokalemia: Secondary | ICD-10-CM | POA: Diagnosis not present

## 2022-07-13 DIAGNOSIS — D649 Anemia, unspecified: Secondary | ICD-10-CM | POA: Diagnosis not present

## 2022-07-13 DIAGNOSIS — C50412 Malignant neoplasm of upper-outer quadrant of left female breast: Secondary | ICD-10-CM | POA: Diagnosis not present

## 2022-07-13 DIAGNOSIS — Z5111 Encounter for antineoplastic chemotherapy: Secondary | ICD-10-CM | POA: Diagnosis not present

## 2022-07-13 DIAGNOSIS — Z170 Estrogen receptor positive status [ER+]: Secondary | ICD-10-CM

## 2022-07-13 DIAGNOSIS — Z5112 Encounter for antineoplastic immunotherapy: Secondary | ICD-10-CM | POA: Diagnosis not present

## 2022-07-13 DIAGNOSIS — D696 Thrombocytopenia, unspecified: Secondary | ICD-10-CM | POA: Diagnosis not present

## 2022-07-13 MED ORDER — HEPARIN SOD (PORK) LOCK FLUSH 100 UNIT/ML IV SOLN
500.0000 [IU] | Freq: Once | INTRAVENOUS | Status: AC | PRN
  Administered 2022-07-13: 500 [IU]

## 2022-07-13 MED ORDER — SODIUM CHLORIDE 0.9 % IV SOLN: Freq: Once | INTRAVENOUS | Status: AC

## 2022-07-13 MED ORDER — ONDANSETRON HCL 4 MG/2ML IJ SOLN: 4.0000 mg | Freq: Once | INTRAMUSCULAR | Status: DC

## 2022-07-13 MED ORDER — SODIUM CHLORIDE 0.9% FLUSH
10.0000 mL | Freq: Once | INTRAVENOUS | Status: AC | PRN
  Administered 2022-07-13: 10 mL

## 2022-07-13 NOTE — Patient Instructions (Signed)
Dehydration, Adult Dehydration is condition in which there is not enough water or other fluids in the body. This happens when a person loses more fluids than he or she takes in. Important body parts cannot work right without the right amount of fluids. Any loss of fluids from the body can cause dehydration. Dehydration can be mild, worse, or very bad. It should be treated right away to keep it from getting very bad. What are the causes? This condition may be caused by: Conditions that cause loss of water or other fluids, such as: Watery poop (diarrhea). Vomiting. Sweating a lot. Peeing (urinating) a lot. Not drinking enough fluids, especially when you: Are ill. Are doing things that take a lot of energy to do. Other illnesses and conditions, such as fever or infection. Certain medicines, such as medicines that take extra fluid out of the body (diuretics). Lack of safe drinking water. Not being able to get enough water and food. What increases the risk? The following factors may make you more likely to develop this condition: Having a long-term (chronic) illness that has not been treated the right way, such as: Diabetes. Heart disease. Kidney disease. Being 65 years of age or older. Having a disability. Living in a place that is high above the ground or sea (high in altitude). The thinner, dried air causes more fluid loss. Doing exercises that put stress on your body for a long time. What are the signs or symptoms? Symptoms of dehydration depend on how bad it is. Mild or worse dehydration Thirst. Dry lips or dry mouth. Feeling dizzy or light-headed, especially when you stand up from sitting. Muscle cramps. Your body making: Dark pee (urine). Pee may be the color of tea. Less pee than normal. Less tears than normal. Headache. Very bad dehydration Changes in skin. Skin may: Be cold to the touch (clammy). Be blotchy or pale. Not go back to normal right after you lightly pinch  it and let it go. Little or no tears, pee, or sweat. Changes in vital signs, such as: Fast breathing. Low blood pressure. Weak pulse. Pulse that is more than 100 beats a minute when you are sitting still. Other changes, such as: Feeling very thirsty. Eyes that look hollow (sunken). Cold hands and feet. Being mixed up (confused). Being very tired (lethargic) or having trouble waking from sleep. Short-term weight loss. Loss of consciousness. How is this treated? Treatment for this condition depends on how bad it is. Treatment should start right away. Do not wait until your condition gets very bad. Very bad dehydration is an emergency. You will need to go to a hospital. Mild or worse dehydration can be treated at home. You may be asked to: Drink more fluids. Drink an oral rehydration solution (ORS). This drink helps get the right amounts of fluids and salts and minerals in the blood (electrolytes). Very bad dehydration can be treated: With fluids through an IV tube. By getting normal levels of salts and minerals in your blood. This is often done by giving salts and minerals through a tube. The tube is passed through your nose and into your stomach. By treating the root cause. Follow these instructions at home: Oral rehydration solution If told by your doctor, drink an ORS: Make an ORS. Use instructions on the package. Start by drinking small amounts, about  cup (120 mL) every 5-10 minutes. Slowly drink more until you have had the amount that your doctor said to have. Eating and drinking          Drink enough clear fluid to keep your pee pale yellow. If you were told to drink an ORS, finish the ORS first. Then, start slowly drinking other clear fluids. Drink fluids such as: Water. Do not drink only water. Doing that can make the salt (sodium) level in your body get too low. Water from ice chips you suck on. Fruit juice that you have added water to (diluted). Low-calorie sports  drinks. Eat foods that have the right amounts of salts and minerals, such as: Bananas. Oranges. Potatoes. Tomatoes. Spinach. Do not drink alcohol. Avoid: Drinks that have a lot of sugar. These include: High-calorie sports drinks. Fruit juice that you did not add water to. Soda. Caffeine. Foods that are greasy or have a lot of fat or sugar. General instructions Take over-the-counter and prescription medicines only as told by your doctor. Do not take salt tablets. Doing that can make the salt level in your body get too high. Return to your normal activities as told by your doctor. Ask your doctor what activities are safe for you. Keep all follow-up visits as told by your doctor. This is important. Contact a doctor if: You have pain in your belly (abdomen) and the pain: Gets worse. Stays in one place. You have a rash. You have a stiff neck. You get angry or annoyed (irritable) more easily than normal. You are more tired or have a harder time waking than normal. You feel: Weak or dizzy. Very thirsty. Get help right away if you have: Any symptoms of very bad dehydration. Symptoms of vomiting, such as: You cannot eat or drink without vomiting. Your vomiting gets worse or does not go away. Your vomit has blood or green stuff in it. Symptoms that get worse with treatment. A fever. A very bad headache. Problems with peeing or pooping (having a bowel movement), such as: Watery poop that gets worse or does not go away. Blood in your poop (stool). This may cause poop to look black and tarry. Not peeing in 6-8 hours. Peeing only a small amount of very dark pee in 6-8 hours. Trouble breathing. These symptoms may be an emergency. Do not wait to see if the symptoms will go away. Get medical help right away. Call your local emergency services (911 in the [place] Do not drive yourself to the hospital. Summary Dehydration is a condition in which there is not enough water or other fluids  in the body. This happens when a person loses more fluids than he or she takes in. Treatment for this condition depends on how bad it is. Treatment should be started right away. Do not wait until your condition gets very bad. Drink enough clear fluid to keep your pee pale yellow. If you were told to drink an oral rehydration solution (ORS), finish the ORS first. Then, start slowly drinking other clear fluids. Take over-the-counter and prescription medicines only as told by your doctor. Get help right away if you have any symptoms of very bad dehydration. This information is not intended to replace advice given to you by your health care provider. Make sure you discuss any questions you have with your health care provider. Document Revised: 11/02/2021 Document Reviewed: 02/06/2019 Elsevier Patient Education  2023 Elsevier Inc.  

## 2022-07-20 ENCOUNTER — Ambulatory Visit: Payer: 59

## 2022-07-20 ENCOUNTER — Ambulatory Visit: Payer: 59 | Admitting: Hematology and Oncology

## 2022-07-20 ENCOUNTER — Other Ambulatory Visit: Payer: 59

## 2022-07-22 ENCOUNTER — Ambulatory Visit: Payer: 59

## 2022-07-24 ENCOUNTER — Encounter: Payer: Self-pay | Admitting: *Deleted

## 2022-07-26 NOTE — Assessment & Plan Note (Signed)
This is a 47 year old premenopausal female patient with newly diagnosed T2 N0 M0 ER/PR and HER2 amplified invasive ductal carcinoma of the left breast referred to breast [facility] for additional recommendations.  Given large tumor measuring almost 4 cm in longest dimension and HER2 amplification, we have discussed about neoadjuvant chemotherapy.  I have recommended neoadjuvant TCHP followed by surgery and adjuvant HER2 based therapy depending on response.  She is now on neoadjuvant chemotherapy. She completed 3 cycles of TCHP.   Benay Pike MD

## 2022-07-26 NOTE — Progress Notes (Deleted)
Symptom Management Consult note [place]    Patient Care Team: [place], Waldemar Dickens, DO as PCP - General (Family Medicine) [name], MD as Consulting Physician (General Surgery) Benay Pike, MD as Consulting Physician (Hematology and Oncology) Kyung Rudd, MD as Consulting Physician (Radiation Oncology) Rockwell Germany, RN as Oncology Nurse Navigator Mauro Kaufmann, RN as Oncology Nurse Navigator    Name of the patient: [name] [name]  [id number]  11/12/1975   Date of visit: 07/27/2022   Current Therapy: TCHP with pegfilgrastim   Heme/Onc History: Oncology History  Malignant neoplasm of upper-outer quadrant of left breast in female, estrogen receptor positive ([place])  04/25/2022 Imaging   Patient had diagnostic bilateral mammogram given palpable mass in the left breast.  This showed a 2.6 x 3.5 x 3 cm irregular mass as well as possible satellite mass which is indeterminate.  An ultrasound was recommended.  Ultrasound of the left breast mass showed 3.9 x 2.4 x 2.3 cm irregular mass with an irregular and spiculated margin at 3:00 middle depth 5 cm from the nipple, irregular mass is hypoechoic.  In the left breast at 1 o'clock position, 3 cm from the nipple there is a round mass measuring 0.3 x 0.2 x 0.3 cm.  No significant abnormalities were seen sonographically in the left axilla.  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  Left breast needle core biopsy at 1:00 3 cm from the nipple showed fibrocystic changes with usual ductal hyperplasia negative for malignancy.  Prognostic showed ER 95% PR 40%, HER2 3+ KI of 50%   05/08/2022 Initial Diagnosis   Malignant neoplasm of upper-outer quadrant of left breast in female,  estrogen receptor positive ([place])   05/08/2022 Cancer Staging   Staging form: Breast, AJCC 8th Edition - Clinical stage from 05/08/2022: Stage IB (cT2, cN0, cM0, G2, ER+, PR+, HER2+) - Signed by Hayden Pedro, PA-C on 05/08/2022 Stage prefix: Initial diagnosis Method of lymph node assessment: Clinical Histologic grading system: 3 grade system   05/18/2022 -  Chemotherapy   Patient is on Treatment Plan : BREAST  Docetaxel + Carboplatin + Trastuzumab + Pertuzumab  (TCHP) q21d      05/23/2022 Genetic Testing   Negative genetics for [facility] CustomNext-Cancer +RNAinsight Panel.  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 Report date is 05/23/2022.   The CustomNext-Cancer+RNAinsight panel offered by [facility] includes sequencing and rearrangement analysis for the following 47 genes:  APC, ATM, AXIN2, BARD1, BMPR1A, BRCA1, BRCA2, BRIP1, CDH1, CDK4, CDKN2A, CHEK2, DICER1, EPCAM, GREM1, HOXB13, MEN1, MLH1, MSH2, MSH3, MSH6, MUTYH, NBN, NF1, NF2, NTHL1, PALB2, PMS2, POLD1, POLE, PTEN, RAD51C, RAD51D, RECQL, RET, SDHA, SDHAF2, SDHB, SDHC, SDHD, SMAD4, SMARCA4, STK11, TP53, TSC1, TSC2, and VHL.  RNA data is routinely analyzed for use in variant interpretation for all genes.     Interval history-: [name] [name] is a 47 y.o. female currently here for follow up.    ROS  All other systems are reviewed and are negative for acute change except as noted in the HPI.    Allergies  Allergen Reactions   Amoxicillin Shortness Of Breath    rash   Coconut (Cocos Nucifera) Anaphylaxis   Penicillins Shortness Of Breath and Rash    Has  patient had a PCN reaction causing immediate rash, facial/tongue/throat swelling, SOB or lightheadedness with hypotension: yes Has patient had a PCN reaction causing severe rash involving mucus membranes or skin necrosis: no Has patient had a PCN reaction that required hospitalization no Has patient had a PCN reaction occurring within the last 10 years: no If all of  the above answers are "NO", then may proceed with Cephalosporin use.    Duloxetine Other (See Comments)    Caused suicidal thoughts   Hydrocodone Itching   Hydrocodone-Acetaminophen Rash   Moxifloxacin Rash     Past Medical History:  Diagnosis Date   Anemia    history of   Breast cancer, left ([place])    Dyspnea    even with speaking   Family history of adverse reaction to anesthesia    mother slow to awaken   Family history of breast cancer 05/10/2022   Family history of ovarian cancer 05/10/2022   Fibromyalgia    GERD (gastroesophageal reflux disease)    History of kidney stones    last stone 6 months ago    Migraine    Seasonal allergies    Tinnitus    Vertigo      Past Surgical History:  Procedure Laterality Date   ADENOIDECTOMY  1991   CYSTOSCOPY WITH RETROGRADE PYELOGRAM, URETEROSCOPY AND STENT PLACEMENT Left 02/22/2021   Procedure: [place], URETEROSCOPY AND STENT PLACEMENT;  Surgeon: Remi Haggard, MD;  Location: [facility];  Service: Urology;  Laterality: Left;   CYSTOSCOPY/URETEROSCOPY/HOLMIUM LASER/STENT PLACEMENT Left 03/10/2021   Procedure: CYSTOSCOPY RETROGRADE, LEFT URETEROSCOPY/ STONE BASKETRY [place] LASER/STENT EXCHANGE;  Surgeon: Ardis Hughs, MD;  Location: [facility];  Service: Urology;  Laterality: Left;   ESOPHAGOGASTRODUODENOSCOPY N/A 11/17/2013   Procedure: ESOPHAGOGASTRODUODENOSCOPY (EGD);  Surgeon: Milus Banister, MD;  Location: [facility];  Service: Endoscopy;  Laterality: N/A;   ESOPHAGOGASTRODUODENOSCOPY N/A 10/15/2015   Procedure: ESOPHAGOGASTRODUODENOSCOPY (EGD);  Surgeon: Gatha Mayer, MD;  [facility];  Service: Endoscopy;  Laterality: N/A;   HOLMIUM LASER APPLICATION  4/0/9735   Procedure: HOLMIUM LASER APPLICATION;  Surgeon: Ardis Hughs, MD;  Location: [facility];  Service: Urology;;   NASAL SEPTOPLASTY W/ TURBINOPLASTY Bilateral 03/18/2018    Procedure: BILATERAL NASAL SEPTOPLASTY WITH TURBINATE REDUCTION;  Surgeon: Jodi Marble, MD;  Location: [facility];  Service: ENT;  Laterality: Bilateral;   PORTACATH PLACEMENT Right 05/17/2022   Procedure: PORT-A-CATH INSERTION WITH ULTRASOUND GUIDANCE;  Surgeon: [name], MD;  Location: [facility];  Service: General;  Laterality: Right;   TONSILLECTOMY  1991   TUBAL LIGATION  2005    Social History   Socioeconomic History   Marital status: Single    Spouse name: Not on file   Number of children: Not on file   Years of education: Not on file   Highest education level: Not on file  Occupational History   Not on file  Tobacco Use   Smoking status: Never   Smokeless tobacco: Never  Vaping Use   Vaping Use: Never used  Substance and Sexual Activity   Alcohol use: No   Drug use: No   Sexual activity: Not on file  Other Topics Concern   Not on file  Social History Narrative   ** Merged History Encounter **       Social Determinants of Health   Financial Resource Strain: Low Risk  (05/10/2022)   Overall Financial Resource Strain (CARDIA)    Difficulty of  Paying Living Expenses: Not very hard  Food Insecurity: No Food Insecurity (05/10/2022)   Hunger Vital Sign    Worried About Running Out of Food in the Last Year: Never true    Ran Out of Food in the Last Year: Never true  Transportation Needs: No Transportation Needs (05/10/2022)   PRAPARE - Hydrologist (Medical): No    Lack of Transportation (Non-Medical): No  Physical Activity: Not on file  Stress: Not on file  Social Connections: Not on file  Intimate Partner Violence: Not on file    Family History  Problem Relation Age of Onset   Asthma Mother    Diabetes Mother    Colon polyps Mother    Ovarian cancer Maternal Grandmother 29   Diabetes Maternal Grandmother    Esophageal cancer Maternal Grandfather        d. 49   Breast cancer Other        MGM's sisters x2    Cervical cancer Cousin        mat cousin; dx <40     Current Outpatient Medications:    acetaminophen (TYLENOL) 500 MG tablet, Take 1,000 mg by mouth every 8 (eight) hours as needed for headache., Disp: , Rfl:    cholestyramine (QUESTRAN) 4 g packet, TAKE 1 PACKET (4 G TOTAL) BY MOUTH 2 TIMES DAILY., Disp: 180 packet, Rfl: 1   dexamethasone (DECADRON) 4 MG tablet, Take 2 tabs by mouth 2 times daily starting day before chemo. Then take 2 tabs daily for 2 days starting day after chemo. Take with food., Disp: 30 tablet, Rfl: 1   diphenoxylate-atropine (LOMOTIL) 2.5-0.025 MG tablet, Take 1 tablet by mouth 4 (four) times daily as needed for diarrhea or loose stools., Disp: 30 tablet, Rfl: 0   lidocaine-prilocaine (EMLA) cream, Apply to affected area once, Disp: 30 g, Rfl: 3   ondansetron (ZOFRAN) 8 MG tablet, Take 1 tablet (8 mg total) by mouth every 8 (eight) hours as needed for nausea or vomiting. Start on the third day after chemotherapy., Disp: 30 tablet, Rfl: 1   potassium chloride SA (KLOR-CON M) 20 MEQ tablet, Take 1 tablet (20 mEq total) by mouth daily., Disp: 7 tablet, Rfl: 0   prochlorperazine (COMPAZINE) 10 MG tablet, Take 1 tablet (10 mg total) by mouth every 6 (six) hours as needed for nausea or vomiting., Disp: 30 tablet, Rfl: 1   traMADol (ULTRAM) 50 MG tablet, Take 1 tablet (50 mg total) by mouth every 6 (six) hours as needed for moderate pain or severe pain., Disp: 25 tablet, Rfl: 0  PHYSICAL EXAM: ECOG FS:1 - Symptomatic but completely ambulatory    Vitals:   07/27/22 0833  BP: 127/74  Pulse: 89  Resp: 16  Temp: 97.8 F (36.6 C)  TempSrc: Temporal  SpO2: 100%  Weight: 179 lb 6.4 oz (81.4 kg)  Height: '5\' 8"'$  (1.727 m)    Physical Exam Vitals and nursing note reviewed.  Constitutional:      Appearance: She is well-developed. She is not ill-appearing or toxic-appearing.  HENT:     Head: Normocephalic.     Nose: Nose normal.  Eyes:     Conjunctiva/sclera: Conjunctivae  normal.  Neck:     Vascular: No JVD.  Cardiovascular:     Rate and Rhythm: Normal rate and regular rhythm.     Pulses: Normal pulses.     Heart sounds: Normal heart sounds.  Pulmonary:     Effort: Pulmonary effort is normal.  Breath sounds: Normal breath sounds.  Chest:       Comments: PAC in right upper chest.  No surrounding erythema or tenderness.  No signs of infection. No palpable left breast mass. At the area of previous abnormality, there still remains some abnormal density.  Abdominal:     General: There is no distension.  Musculoskeletal:     Cervical back: Normal range of motion.  Skin:    General: Skin is warm and dry.  Neurological:     Mental Status: She is oriented to person, place, and time.        LABORATORY DATA: I have reviewed the data as listed    Latest Ref Rng & Units 07/27/2022    8:17 AM 07/07/2022    8:25 AM 06/29/2022    8:46 AM  CBC  WBC 4.0 - 10.5 K/uL 3.0  3.3  3.3   Hemoglobin 12.0 - 15.0 g/dL 9.0  10.2  9.7   Hematocrit 36.0 - 46.0 % 25.4  28.4  28.2   Platelets 150 - 400 K/uL 129  182  184         Latest Ref Rng & Units 07/07/2022    8:25 AM 06/29/2022    8:46 AM 06/19/2022    9:01 AM  CMP  Glucose 70 - 99 mg/dL 95  123  104   BUN 6 - 20 mg/dL '11  15  6   '$ Creatinine 0.44 - 1.00 mg/dL 1.14  1.23  1.20   Sodium 135 - 145 mmol/L 141  143  140   Potassium 3.5 - 5.1 mmol/L 3.2  3.7  3.0   Chloride 98 - 111 mmol/L 104  106  106   CO2 22 - 32 mmol/L '31  31  29   '$ Calcium 8.9 - 10.3 mg/dL 9.3  9.2  8.9   Total Protein 6.5 - 8.1 g/dL 6.7  6.2  6.0   Total Bilirubin 0.3 - 1.2 mg/dL 0.6  0.4  0.4   Alkaline Phos 38 - 126 U/L 56  61  81   AST 15 - 41 U/L '15  24  26   '$ ALT 0 - 44 U/L 13  27  33      RADIOGRAPHIC STUDIES (from last 24 hours if applicable) I have personally reviewed the radiological images as listed and agreed with the findings in the report.  ASSESSMENT & PLAN:  Patient is a 47 y.o. female  with oncologic  history of malignant neoplasm of upper-outer quadrant of left breast, ER positive followed by Dr. Chryl Heck.  I have viewed most recent oncology note and lab work.   #)Malignant neoplasm of upper-outer quadrant of left breast, ER positive - She is now post 3 cycles of TCHP.   #) Grade 1 diarrhea, on Imodium PRN, only had to take it once. Dose reduced chemo to ensure better tolerance and this has certainly helped  #)AKI, resolved --Clinically patient does not appear dehydrated on exam.  #) Anemia and thrombocytopenia -CBC today shows hemoglobin of 9.7 and platelet count normal today.   No intervention needed at this time.    #) Hypokalemia and hypomagnesemia  All questions were answered. The patient knows to call the clinic with any problems, questions or concerns. No barriers to learning was detected.  I have spent a total of 30 minutes minutes of face-to-face and non-face-to-face time, preparing to see the patient, obtaining and/or reviewing separately obtained history, performing a medically appropriate examination, counseling and educating the  patient, ordering tests, documenting clinical information in the electronic health record, and care coordination (communications with other health care professionals or caregivers).    Benay Pike, MD Department of Hematology/Oncology [facility] at [facility] Phone: [phone]  [phone]    07/27/2022 8:46 AM

## 2022-07-27 ENCOUNTER — Inpatient Hospital Stay: Payer: 59

## 2022-07-27 ENCOUNTER — Encounter: Payer: Self-pay | Admitting: Hematology and Oncology

## 2022-07-27 ENCOUNTER — Other Ambulatory Visit: Payer: Self-pay | Admitting: *Deleted

## 2022-07-27 ENCOUNTER — Inpatient Hospital Stay (HOSPITAL_BASED_OUTPATIENT_CLINIC_OR_DEPARTMENT_OTHER): Payer: 59 | Admitting: Hematology and Oncology

## 2022-07-27 ENCOUNTER — Encounter: Payer: Self-pay | Admitting: *Deleted

## 2022-07-27 ENCOUNTER — Telehealth: Payer: Self-pay | Admitting: *Deleted

## 2022-07-27 VITALS — BP 116/76 | HR 78 | Resp 16

## 2022-07-27 VITALS — BP 127/74 | HR 89 | Temp 97.8°F | Resp 16 | Ht 68.0 in | Wt 179.4 lb

## 2022-07-27 DIAGNOSIS — Z5189 Encounter for other specified aftercare: Secondary | ICD-10-CM | POA: Diagnosis not present

## 2022-07-27 DIAGNOSIS — Z170 Estrogen receptor positive status [ER+]: Secondary | ICD-10-CM | POA: Diagnosis not present

## 2022-07-27 DIAGNOSIS — C50412 Malignant neoplasm of upper-outer quadrant of left female breast: Secondary | ICD-10-CM

## 2022-07-27 DIAGNOSIS — E876 Hypokalemia: Secondary | ICD-10-CM | POA: Diagnosis not present

## 2022-07-27 DIAGNOSIS — Z5112 Encounter for antineoplastic immunotherapy: Secondary | ICD-10-CM | POA: Diagnosis not present

## 2022-07-27 DIAGNOSIS — D649 Anemia, unspecified: Secondary | ICD-10-CM | POA: Diagnosis not present

## 2022-07-27 DIAGNOSIS — D696 Thrombocytopenia, unspecified: Secondary | ICD-10-CM | POA: Diagnosis not present

## 2022-07-27 DIAGNOSIS — Z5111 Encounter for antineoplastic chemotherapy: Secondary | ICD-10-CM | POA: Diagnosis not present

## 2022-07-27 LAB — CMP (CANCER CENTER ONLY)
ALT: 21 U/L (ref 0–44)
AST: 19 U/L (ref 15–41)
Albumin: 3.6 g/dL (ref 3.5–5.0)
Alkaline Phosphatase: 63 U/L (ref 38–126)
Anion gap: 7 (ref 5–15)
BUN: 12 mg/dL (ref 6–20)
CO2: 30 mmol/L (ref 22–32)
Calcium: 9 mg/dL (ref 8.9–10.3)
Chloride: 105 mmol/L (ref 98–111)
Creatinine: 1.04 mg/dL — ABNORMAL HIGH (ref 0.44–1.00)
GFR, Estimated: >60 mL/min (ref >60)
Glucose, Bld: 119 mg/dL — ABNORMAL HIGH (ref 70–99)
Potassium: 2.9 mmol/L — ABNORMAL LOW (ref 3.5–5.1)
Sodium: 142 mmol/L (ref 135–145)
Total Bilirubin: 0.5 mg/dL (ref 0.3–1.2)
Total Protein: 5.9 g/dL — ABNORMAL LOW (ref 6.5–8.1)

## 2022-07-27 LAB — CBC WITH DIFFERENTIAL (CANCER CENTER ONLY)
Abs Immature Granulocytes: 0.01 10*3/uL (ref 0.00–0.07)
Basophils Absolute: 0 10*3/uL (ref 0.0–0.1)
Basophils Relative: 0 %
Eosinophils Absolute: 0 10*3/uL (ref 0.0–0.5)
Eosinophils Relative: 0 %
HCT: 25.4 % — ABNORMAL LOW (ref 36.0–46.0)
Hemoglobin: 9 g/dL — ABNORMAL LOW (ref 12.0–15.0)
Immature Granulocytes: 0 %
Lymphocytes Relative: 32 %
Lymphs Abs: 1 10*3/uL (ref 0.7–4.0)
MCH: 33 pg (ref 26.0–34.0)
MCHC: 35.4 g/dL (ref 30.0–36.0)
MCV: 93 fL (ref 80.0–100.0)
Monocytes Absolute: 0.2 10*3/uL (ref 0.1–1.0)
Monocytes Relative: 5 %
Neutro Abs: 1.8 10*3/uL (ref 1.7–7.7)
Neutrophils Relative %: 63 %
Platelet Count: 129 10*3/uL — ABNORMAL LOW (ref 150–400)
RBC: 2.73 MIL/uL — ABNORMAL LOW (ref 3.87–5.11)
RDW: 15.7 % — ABNORMAL HIGH (ref 11.5–15.5)
WBC Count: 3 10*3/uL — ABNORMAL LOW (ref 4.0–10.5)
nRBC: 0 % (ref 0.0–0.2)

## 2022-07-27 MED ORDER — ACETAMINOPHEN 325 MG PO TABS
650.0000 mg | ORAL_TABLET | Freq: Once | ORAL | Status: AC
  Administered 2022-07-27: 650 mg via ORAL
  Filled 2022-07-27: qty 2

## 2022-07-27 MED ORDER — SODIUM CHLORIDE 0.9 % IV SOLN
150.0000 mg | Freq: Once | INTRAVENOUS | Status: AC
  Administered 2022-07-27: 150 mg via INTRAVENOUS
  Filled 2022-07-27: qty 150

## 2022-07-27 MED ORDER — POTASSIUM CHLORIDE CRYS ER 20 MEQ PO TBCR: 20.0000 meq | EXTENDED_RELEASE_TABLET | Freq: Two times a day (BID) | ORAL | 0 refills | Status: DC

## 2022-07-27 MED ORDER — DIPHENHYDRAMINE HCL 25 MG PO CAPS
50.0000 mg | ORAL_CAPSULE | Freq: Once | ORAL | Status: AC
  Administered 2022-07-27: 50 mg via ORAL
  Filled 2022-07-27: qty 2

## 2022-07-27 MED ORDER — SODIUM CHLORIDE 0.9 % IV SOLN: Freq: Once | INTRAVENOUS | Status: AC

## 2022-07-27 MED ORDER — PALONOSETRON HCL INJECTION 0.25 MG/5ML
0.2500 mg | Freq: Once | INTRAVENOUS | Status: AC
  Administered 2022-07-27: 0.25 mg via INTRAVENOUS
  Filled 2022-07-27: qty 5

## 2022-07-27 MED ORDER — TRASTUZUMAB-ANNS CHEMO 150 MG IV SOLR
6.0000 mg/kg | Freq: Once | INTRAVENOUS | Status: AC
  Administered 2022-07-27: 525 mg via INTRAVENOUS
  Filled 2022-07-27: qty 25

## 2022-07-27 MED ORDER — SODIUM CHLORIDE 0.9 % IV SOLN
50.0000 mg/m2 | Freq: Once | INTRAVENOUS | Status: AC
  Administered 2022-07-27: 100 mg via INTRAVENOUS
  Filled 2022-07-27: qty 10

## 2022-07-27 MED ORDER — SODIUM CHLORIDE 0.9 % IV SOLN
420.0000 mg | Freq: Once | INTRAVENOUS | Status: AC
  Administered 2022-07-27: 420 mg via INTRAVENOUS
  Filled 2022-07-27: qty 14

## 2022-07-27 MED ORDER — SODIUM CHLORIDE 0.9 % IV SOLN
597.5000 mg | Freq: Once | INTRAVENOUS | Status: AC
  Administered 2022-07-27: 600 mg via INTRAVENOUS
  Filled 2022-07-27: qty 60

## 2022-07-27 MED ORDER — SODIUM CHLORIDE 0.9% FLUSH
10.0000 mL | INTRAVENOUS | Status: DC | PRN
  Administered 2022-07-27: 10 mL

## 2022-07-27 MED ORDER — SODIUM CHLORIDE 0.9 % IV SOLN
10.0000 mg | Freq: Once | INTRAVENOUS | Status: AC
  Administered 2022-07-27: 10 mg via INTRAVENOUS
  Filled 2022-07-27: qty 10

## 2022-07-27 MED ORDER — SODIUM CHLORIDE 0.9% FLUSH
10.0000 mL | Freq: Once | INTRAVENOUS | Status: AC
  Administered 2022-07-27: 10 mL

## 2022-07-27 MED ORDER — HEPARIN SOD (PORK) LOCK FLUSH 100 UNIT/ML IV SOLN
500.0000 [IU] | Freq: Once | INTRAVENOUS | Status: AC | PRN
  Administered 2022-07-27: 500 [IU]

## 2022-07-27 NOTE — Telephone Encounter (Signed)
Pt K+ low. Given instruction through infusion nurse Morey Hummingbird to have pt take K+ BID for 4 days.

## 2022-07-27 NOTE — Telephone Encounter (Signed)
-----  Message from Benay Pike, MD sent at 07/27/2022  9:03 AM EST ----- Can we send Kcl 20 meq BID for 4 days for MS Lochlear. Potassium is low. NO need for IV potassium today.

## 2022-07-27 NOTE — Patient Instructions (Signed)
[place] [facility] [facility] ONCOLOGY  Discharge Instructions: Thank you for choosing [facility] to provide your oncology and hematology care.   If you have a lab appointment with the [facility], please go directly to the [facility] and check in at the registration area.   Wear comfortable clothing and clothing appropriate for easy access to any Portacath or PICC line.   We strive to give you quality time with your provider. You may need to reschedule your appointment if you arrive late (15 or more minutes).  Arriving late affects you and other patients whose appointments are after yours.  Also, if you miss three or more appointments without notifying the office, you may be dismissed from the clinic at the provider's discretion.      For prescription refill requests, have your pharmacy contact our office and allow 72 hours for refills to be completed.    Today you received the following chemotherapy and/or immunotherapy agents: Kanjinti/Perjeta/Docetaxel/Carboplatin      To help prevent nausea and vomiting after your treatment, we encourage you to take your nausea medication as directed.  BELOW ARE SYMPTOMS THAT SHOULD BE REPORTED IMMEDIATELY: *FEVER GREATER THAN 100.4 F (38 C) OR HIGHER *CHILLS OR SWEATING *NAUSEA AND VOMITING THAT IS NOT CONTROLLED WITH YOUR NAUSEA MEDICATION *UNUSUAL SHORTNESS OF BREATH *UNUSUAL BRUISING OR BLEEDING *URINARY PROBLEMS (pain or burning when urinating, or frequent urination) *BOWEL PROBLEMS (unusual diarrhea, constipation, pain near the anus) TENDERNESS IN MOUTH AND THROAT WITH OR WITHOUT PRESENCE OF ULCERS (sore throat, sores in mouth, or a toothache) UNUSUAL RASH, SWELLING OR PAIN  UNUSUAL VAGINAL DISCHARGE OR ITCHING   Items with * indicate a potential emergency and should be followed up as soon as possible or go to the Emergency Department if any problems should occur.  Please show the CHEMOTHERAPY ALERT CARD or  IMMUNOTHERAPY ALERT CARD at check-in to the Emergency Department and triage nurse.  Should you have questions after your visit or need to cancel or reschedule your appointment, please contact [facility]  Dept: [phone]  and follow the prompts.  Office hours are 8:00 a.m. to 4:30 p.m. Monday - Friday. Please note that voicemails left after 4:00 p.m. may not be returned until the following business day.  We are closed weekends and major holidays. You have access to a nurse at all times for urgent questions. Please call the main number to the clinic Dept: [phone] and follow the prompts.   For any non-urgent questions, you may also contact your provider using MyChart. We now offer e-Visits for anyone 59 and older to request care online for non-urgent symptoms. For details visit [email or web address].   Also download the MyChart app! Go to the app store, search "MyChart", open the app, select [facility], and log in with your MyChart username and password.

## 2022-07-27 NOTE — Progress Notes (Signed)
Symptom Management Consult note [place]    Patient Care Team: [place], Waldemar Dickens, DO as PCP - General (Family Medicine) [name], MD as Consulting Physician (General Surgery) Benay Pike, MD as Consulting Physician (Hematology and Oncology) Kyung Rudd, MD as Consulting Physician (Radiation Oncology) Rockwell Germany, RN as Oncology Nurse Navigator Mauro Kaufmann, RN as Oncology Nurse Navigator    Name of the patient: [name] [name]  [id number]  February 24, 1976   Date of visit: 07/27/2022   Current Therapy: TCHP with pegfilgrastim   Heme/Onc History: Oncology History  Malignant neoplasm of upper-outer quadrant of left breast in female, estrogen receptor positive ([place])  04/25/2022 Imaging   Patient had diagnostic bilateral mammogram given palpable mass in the left breast.  This showed a 2.6 x 3.5 x 3 cm irregular mass as well as possible satellite mass which is indeterminate.  An ultrasound was recommended.  Ultrasound of the left breast mass showed 3.9 x 2.4 x 2.3 cm irregular mass with an irregular and spiculated margin at 3:00 middle depth 5 cm from the nipple, irregular mass is hypoechoic.  In the left breast at 1 o'clock position, 3 cm from the nipple there is a round mass measuring 0.3 x 0.2 x 0.3 cm.  No significant abnormalities were seen sonographically in the left axilla.  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  Left breast needle core biopsy at 1:00 3 cm from the nipple showed fibrocystic changes with usual ductal hyperplasia negative for malignancy.  Prognostic showed ER 95% PR 40%, HER2 3+ KI of 50%   05/08/2022 Initial Diagnosis   Malignant neoplasm of upper-outer quadrant of left breast in female,  estrogen receptor positive ([place])   05/08/2022 Cancer Staging   Staging form: Breast, AJCC 8th Edition - Clinical stage from 05/08/2022: Stage IB (cT2, cN0, cM0, G2, ER+, PR+, HER2+) - Signed by Hayden Pedro, PA-C on 05/08/2022 Stage prefix: Initial diagnosis Method of lymph node assessment: Clinical Histologic grading system: 3 grade system   05/18/2022 -  Chemotherapy   Patient is on Treatment Plan : BREAST  Docetaxel + Carboplatin + Trastuzumab + Pertuzumab  (TCHP) q21d      05/23/2022 Genetic Testing   Negative genetics for [facility] CustomNext-Cancer +RNAinsight Panel.  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 Report date is 05/23/2022.   The CustomNext-Cancer+RNAinsight panel offered by [facility] includes sequencing and rearrangement analysis for the following 47 genes:  APC, ATM, AXIN2, BARD1, BMPR1A, BRCA1, BRCA2, BRIP1, CDH1, CDK4, CDKN2A, CHEK2, DICER1, EPCAM, GREM1, HOXB13, MEN1, MLH1, MSH2, MSH3, MSH6, MUTYH, NBN, NF1, NF2, NTHL1, PALB2, PMS2, POLD1, POLE, PTEN, RAD51C, RAD51D, RECQL, RET, SDHA, SDHAF2, SDHB, SDHC, SDHD, SMAD4, SMARCA4, STK11, TP53, TSC1, TSC2, and VHL.  RNA data is routinely analyzed for use in variant interpretation for all genes.     Interval history-: [name] [name] is a 47 y.o. female currently here for follow up. She is doing remarkably well since the dose reduction. She had barely any diarrhea, had to take Imodium once. No neuropathy reported. She has more energy, is able to do some activities. She is excited that she can't feel the breast mass anymore. Rest of the pertinent 10 point ROS reviewed and negative.   Allergies  Allergen Reactions   Amoxicillin Shortness Of Breath    rash   Coconut (Cocos Nucifera) Anaphylaxis   Penicillins Shortness Of Breath and Rash    Has patient had a PCN reaction causing immediate rash, facial/tongue/throat swelling, SOB or lightheadedness with hypotension: yes Has patient had a PCN reaction causing severe  rash involving mucus membranes or skin necrosis: no Has patient had a PCN reaction that required hospitalization no Has patient had a PCN reaction occurring within the last 10 years: no If all of the above answers are "NO", then may proceed with Cephalosporin use.    Duloxetine Other (See Comments)    Caused suicidal thoughts   Hydrocodone Itching   Hydrocodone-Acetaminophen Rash   Moxifloxacin Rash     Past Medical History:  Diagnosis Date   Anemia    history of   Breast cancer, left ([place])    Dyspnea    even with speaking   Family history of adverse reaction to anesthesia    mother slow to awaken   Family history of breast cancer 05/10/2022   Family history of ovarian cancer 05/10/2022   Fibromyalgia    GERD (gastroesophageal reflux disease)    History of kidney stones    last stone 6 months ago    Migraine    Seasonal allergies    Tinnitus    Vertigo      Past Surgical History:  Procedure Laterality Date   ADENOIDECTOMY  1991   CYSTOSCOPY WITH RETROGRADE PYELOGRAM, URETEROSCOPY AND STENT PLACEMENT Left 02/22/2021   Procedure: [place], URETEROSCOPY AND STENT PLACEMENT;  Surgeon: Remi Haggard, MD;  Location: [facility];  Service: Urology;  Laterality: Left;   CYSTOSCOPY/URETEROSCOPY/HOLMIUM LASER/STENT PLACEMENT Left 03/10/2021   Procedure: CYSTOSCOPY RETROGRADE, LEFT URETEROSCOPY/ STONE BASKETRY [place] LASER/STENT EXCHANGE;  Surgeon: Ardis Hughs, MD;  [facility];  Service: Urology;  Laterality: Left;   ESOPHAGOGASTRODUODENOSCOPY N/A 11/17/2013   Procedure: ESOPHAGOGASTRODUODENOSCOPY (EGD);  Surgeon: Milus Banister, MD;  Location: [facility];  Service: Endoscopy;  Laterality: N/A;   ESOPHAGOGASTRODUODENOSCOPY N/A 10/15/2015   Procedure: ESOPHAGOGASTRODUODENOSCOPY (EGD);  Surgeon: Gatha Mayer, MD;  Location[facility];  Service: Endoscopy;  Laterality: N/A;   HOLMIUM LASER APPLICATION  0/07/7492    Procedure: HOLMIUM LASER APPLICATION;  Surgeon: Ardis Hughs, MD;  Location: [facility];  Service: Urology;;   NASAL SEPTOPLASTY W/ TURBINOPLASTY Bilateral 03/18/2018   Procedure: BILATERAL NASAL SEPTOPLASTY WITH TURBINATE REDUCTION;  Surgeon: Jodi Marble, MD;  Location: [facility];  Service: ENT;  Laterality: Bilateral;   PORTACATH PLACEMENT Right 05/17/2022   Procedure: PORT-A-CATH INSERTION WITH ULTRASOUND GUIDANCE;  Surgeon: [name], MD;  Location: [facility];  Service: General;  Laterality: Right;   TONSILLECTOMY  1991   TUBAL LIGATION  2005    Social History   Socioeconomic History   Marital status: Single    Spouse name: Not on file   Number of children: Not on file   Years of education: Not on file   Highest education level: Not on file  Occupational History   Not on file  Tobacco Use   Smoking status: Never   Smokeless tobacco: Never  Vaping Use   Vaping Use: Never used  Substance and Sexual Activity   Alcohol use: No   Drug use: No   Sexual activity: Not on file  Other Topics Concern   Not on file  Social History Narrative   ** Merged History Encounter **  Social Determinants of Health   Financial Resource Strain: Low Risk  (05/10/2022)   Overall Financial Resource Strain (CARDIA)    Difficulty of Paying Living Expenses: Not very hard  Food Insecurity: No Food Insecurity (05/10/2022)   Hunger Vital Sign    Worried About Running Out of Food in the Last Year: Never true    Ran Out of Food in the Last Year: Never true  Transportation Needs: No Transportation Needs (05/10/2022)   PRAPARE - Hydrologist (Medical): No    Lack of Transportation (Non-Medical): No  Physical Activity: Not on file  Stress: Not on file  Social Connections: Not on file  Intimate Partner Violence: Not on file    Family History  Problem Relation Age of Onset   Asthma Mother    Diabetes Mother    Colon  polyps Mother    Ovarian cancer Maternal Grandmother 52   Diabetes Maternal Grandmother    Esophageal cancer Maternal Grandfather        d. 43   Breast cancer Other        MGM's sisters x2   Cervical cancer Cousin        mat cousin; dx <40     Current Outpatient Medications:    acetaminophen (TYLENOL) 500 MG tablet, Take 1,000 mg by mouth every 8 (eight) hours as needed for headache., Disp: , Rfl:    cholestyramine (QUESTRAN) 4 g packet, TAKE 1 PACKET (4 G TOTAL) BY MOUTH 2 TIMES DAILY., Disp: 180 packet, Rfl: 1   dexamethasone (DECADRON) 4 MG tablet, Take 2 tabs by mouth 2 times daily starting day before chemo. Then take 2 tabs daily for 2 days starting day after chemo. Take with food., Disp: 30 tablet, Rfl: 1   diphenoxylate-atropine (LOMOTIL) 2.5-0.025 MG tablet, Take 1 tablet by mouth 4 (four) times daily as needed for diarrhea or loose stools., Disp: 30 tablet, Rfl: 0   lidocaine-prilocaine (EMLA) cream, Apply to affected area once, Disp: 30 g, Rfl: 3   ondansetron (ZOFRAN) 8 MG tablet, Take 1 tablet (8 mg total) by mouth every 8 (eight) hours as needed for nausea or vomiting. Start on the third day after chemotherapy., Disp: 30 tablet, Rfl: 1   potassium chloride SA (KLOR-CON M) 20 MEQ tablet, Take 1 tablet (20 mEq total) by mouth daily., Disp: 7 tablet, Rfl: 0   prochlorperazine (COMPAZINE) 10 MG tablet, Take 1 tablet (10 mg total) by mouth every 6 (six) hours as needed for nausea or vomiting., Disp: 30 tablet, Rfl: 1   traMADol (ULTRAM) 50 MG tablet, Take 1 tablet (50 mg total) by mouth every 6 (six) hours as needed for moderate pain or severe pain., Disp: 25 tablet, Rfl: 0  PHYSICAL EXAM: ECOG FS:1 - Symptomatic but completely ambulatory    Vitals:   07/27/22 0833  BP: 127/74  Pulse: 89  Resp: 16  Temp: 97.8 F (36.6 C)  TempSrc: Temporal  SpO2: 100%  Weight: 179 lb 6.4 oz (81.4 kg)  Height: '5\' 8"'$  (1.727 m)    Physical Exam Vitals and nursing note reviewed.   Constitutional:      Appearance: She is well-developed. She is not ill-appearing or toxic-appearing.  HENT:     Head: Normocephalic.     Nose: Nose normal.  Eyes:     Conjunctiva/sclera: Conjunctivae normal.  Neck:     Vascular: No JVD.  Cardiovascular:     Rate and Rhythm: Normal rate and regular rhythm.  Pulses: Normal pulses.     Heart sounds: Normal heart sounds.  Pulmonary:     Effort: Pulmonary effort is normal.     Breath sounds: Normal breath sounds.  Chest:       Comments: PAC in right upper chest.  No surrounding erythema or tenderness.  No signs of infection. No palpable left breast mass. At the area of previous abnormality, there still remains some abnormal density.  Abdominal:     General: There is no distension.  Musculoskeletal:     Cervical back: Normal range of motion.  Skin:    General: Skin is warm and dry.  Neurological:     Mental Status: She is oriented to person, place, and time.        LABORATORY DATA: I have reviewed the data as listed    Latest Ref Rng & Units 07/27/2022    8:17 AM 07/07/2022    8:25 AM 06/29/2022    8:46 AM  CBC  WBC 4.0 - 10.5 K/uL 3.0  3.3  3.3   Hemoglobin 12.0 - 15.0 g/dL 9.0  10.2  9.7   Hematocrit 36.0 - 46.0 % 25.4  28.4  28.2   Platelets 150 - 400 K/uL 129  182  184         Latest Ref Rng & Units 07/07/2022    8:25 AM 06/29/2022    8:46 AM 06/19/2022    9:01 AM  CMP  Glucose 70 - 99 mg/dL 95  123  104   BUN 6 - 20 mg/dL '11  15  6   '$ Creatinine 0.44 - 1.00 mg/dL 1.14  1.23  1.20   Sodium 135 - 145 mmol/L 141  143  140   Potassium 3.5 - 5.1 mmol/L 3.2  3.7  3.0   Chloride 98 - 111 mmol/L 104  106  106   CO2 22 - 32 mmol/L '31  31  29   '$ Calcium 8.9 - 10.3 mg/dL 9.3  9.2  8.9   Total Protein 6.5 - 8.1 g/dL 6.7  6.2  6.0   Total Bilirubin 0.3 - 1.2 mg/dL 0.6  0.4  0.4   Alkaline Phos 38 - 126 U/L 56  61  81   AST 15 - 41 U/L '15  24  26   '$ ALT 0 - 44 U/L 13  27  33      RADIOGRAPHIC STUDIES (from  last 24 hours if applicable) I have personally reviewed the radiological images as listed and agreed with the findings in the report.  ASSESSMENT & PLAN:  Patient is a 47 y.o. female  with oncologic history of malignant neoplasm of upper-outer quadrant of left breast, ER positive followed by Dr. Chryl Heck.  I have viewed most recent oncology note and lab work.   #)Malignant neoplasm of upper-outer quadrant of left breast, ER positive - She is now post 3 cycles of TCHP.   #) Grade 1 diarrhea, on Imodium PRN, only had to take it once. Dose reduced chemo to ensure better tolerance and this has certainly helped  #)AKI, resolved --Clinically patient does not appear dehydrated on exam.  #) Anemia and thrombocytopenia -Hb 9.0 and platelet count 129. Ok to proceed.  #) Hypokalemia and hypomagnesemia, CMP pending today  All questions were answered. The patient knows to call the clinic with any problems, questions or concerns. No barriers to learning was detected.  I have spent a total of 20 minutes minutes of face-to-face and non-face-to-face time, preparing to  see the patient, obtaining and/or reviewing separately obtained history, performing a medically appropriate examination, counseling and educating the patient, ordering tests, documenting clinical information in the electronic health record, and care coordination (communications with other health care professionals or caregivers).    Benay Pike, MD Department of Hematology/Oncology [facility] Phone: [phone]  [phone]    07/27/2022 8:48 AM

## 2022-07-28 ENCOUNTER — Inpatient Hospital Stay: Payer: 59

## 2022-07-28 ENCOUNTER — Other Ambulatory Visit: Payer: Self-pay

## 2022-07-28 VITALS — BP 140/90 | HR 77 | Resp 16

## 2022-07-28 DIAGNOSIS — Z5112 Encounter for antineoplastic immunotherapy: Secondary | ICD-10-CM | POA: Diagnosis not present

## 2022-07-28 DIAGNOSIS — Z170 Estrogen receptor positive status [ER+]: Secondary | ICD-10-CM

## 2022-07-28 DIAGNOSIS — Z5189 Encounter for other specified aftercare: Secondary | ICD-10-CM | POA: Diagnosis not present

## 2022-07-28 DIAGNOSIS — Z5111 Encounter for antineoplastic chemotherapy: Secondary | ICD-10-CM | POA: Diagnosis not present

## 2022-07-28 DIAGNOSIS — C50412 Malignant neoplasm of upper-outer quadrant of left female breast: Secondary | ICD-10-CM

## 2022-07-28 DIAGNOSIS — D649 Anemia, unspecified: Secondary | ICD-10-CM | POA: Diagnosis not present

## 2022-07-28 DIAGNOSIS — D696 Thrombocytopenia, unspecified: Secondary | ICD-10-CM | POA: Diagnosis not present

## 2022-07-28 DIAGNOSIS — E876 Hypokalemia: Secondary | ICD-10-CM | POA: Diagnosis not present

## 2022-07-28 MED ORDER — SODIUM CHLORIDE 0.9 % IV SOLN: Freq: Once | INTRAVENOUS | Status: AC

## 2022-07-28 MED ORDER — ONDANSETRON HCL 4 MG/2ML IJ SOLN: 4.0000 mg | Freq: Once | INTRAMUSCULAR | Status: DC

## 2022-07-28 MED ORDER — FAMOTIDINE IN NACL 20-0.9 MG/50ML-% IV SOLN
20.0000 mg | Freq: Once | INTRAVENOUS | Status: AC
  Administered 2022-07-28: 20 mg via INTRAVENOUS
  Filled 2022-07-28: qty 50

## 2022-07-28 MED ORDER — SODIUM CHLORIDE 0.9% FLUSH
10.0000 mL | Freq: Once | INTRAVENOUS | Status: AC | PRN
  Administered 2022-07-28: 10 mL

## 2022-07-28 MED ORDER — HEPARIN SOD (PORK) LOCK FLUSH 100 UNIT/ML IV SOLN
500.0000 [IU] | Freq: Once | INTRAVENOUS | Status: AC | PRN
  Administered 2022-07-28: 500 [IU]

## 2022-07-28 NOTE — Patient Instructions (Signed)
Dehydration, Adult Dehydration is a condition in which there is not enough water or other fluids in the body. This happens when a person loses more fluids than he or she takes in. Important organs, such as the kidneys, brain, and heart, cannot function without a proper amount of fluids. Any loss of fluids from the body can lead to dehydration. Dehydration can be mild, moderate, or severe. It should be treated right away to prevent it from becoming severe. What are the causes? Dehydration may be caused by: Conditions that cause loss of water or other fluids, such as diarrhea, vomiting, or sweating or urinating a lot. Not drinking enough fluids, especially when you are ill or doing activities that require a lot of energy. Other illnesses and conditions, such as fever or infection. Certain medicines, such as medicines that remove excess fluid from the body (diuretics). Lack of safe drinking water. Not being able to get enough water and food. What increases the risk? The following factors may make you more likely to develop this condition: Having a long-term (chronic) illness that has not been treated properly, such as diabetes, heart disease, or kidney disease. Being 65 years of age or older. Having a disability. Living in a place that is high in altitude, where thinner, drier air causes more fluid loss. Doing exercises that put stress on your body for a long time (endurance sports). What are the signs or symptoms? Symptoms of dehydration depend on how severe it is. Mild or moderate dehydration Thirst. Dry lips or dry mouth. Dizziness or light-headedness, especially when standing up from a seated position. Muscle cramps. Dark urine. Urine may be the color of tea. Less urine or tears produced than usual. Headache. Severe dehydration Changes in skin. Your skin may be cold and clammy, blotchy, or pale. Your skin also may not return to normal after being lightly pinched and released. Little or  no tears, urine, or sweat. Changes in vital signs, such as rapid breathing and low blood pressure. Your pulse may be weak or may be faster than 100 beats a minute when you are sitting still. Other changes, such as: Feeling very thirsty. Sunken eyes. Cold hands and feet. Confusion. Being very tired (lethargic) or having trouble waking from sleep. Short-term weight loss. Loss of consciousness. How is this diagnosed? This condition is diagnosed based on your symptoms and a physical exam. You may have blood and urine tests to help confirm the diagnosis. How is this treated? Treatment for this condition depends on how severe it is. Treatment should be started right away. Do not wait until dehydration becomes severe. Severe dehydration is an emergency and needs to be treated in a hospital. Mild or moderate dehydration can be treated at home. You may be asked to: Drink more fluids. Drink an oral rehydration solution (ORS). This drink helps restore proper amounts of fluids and salts and minerals in the blood (electrolytes). Severe dehydration can be treated: With IV fluids. By correcting abnormal levels of electrolytes. This is often done by giving electrolytes through a tube that is passed through your nose and into your stomach (nasogastric tube, or NG tube). By treating the underlying cause of dehydration. Follow these instructions at home: Oral rehydration solution If told by your health care provider, drink an ORS: Make an ORS by following instructions on the package. Start by drinking small amounts, about  cup (120 mL) every 5-10 minutes. Slowly increase how much you drink until you have taken the amount recommended by your health   care provider. Eating and drinking        Drink enough clear fluid to keep your urine pale yellow. If you were told to drink an ORS, finish the ORS first and then start slowly drinking other clear fluids. Drink fluids such as: Water. Do not drink only  water. Doing that can lead to hyponatremia, which is having too little salt (sodium) in the body. Water from ice chips you suck on. Fruit juice that you have added water to (diluted fruit juice). Low-calorie sports drinks. Eat foods that contain a healthy balance of electrolytes, such as bananas, oranges, potatoes, tomatoes, and spinach. Do not drink alcohol. Avoid the following: Drinks that contain a lot of sugar. These include high-calorie sports drinks, fruit juice that is not diluted, and soda. Caffeine. Foods that are greasy or contain a lot of fat or sugar. General instructions Take over-the-counter and prescription medicines only as told by your health care provider. Do not take sodium tablets. Doing that can lead to having too much sodium in the body (hypernatremia). Return to your normal activities as told by your health care provider. Ask your health care provider what activities are safe for you. Keep all follow-up visits as told by your health care provider. This is important. Contact a health care provider if: You have muscle cramps, pain, or discomfort, such as: Pain in your abdomen and the pain gets worse or stays in one area (localizes). Stiff neck. You have a rash. You are more irritable than usual. You are sleepier or have a harder time waking than usual. You feel weak or dizzy. You feel very thirsty. Get help right away if you have: Any symptoms of severe dehydration. Symptoms of vomiting, such as: You cannot eat or drink without vomiting. Vomiting gets worse or does not go away. Vomit includes blood or green matter (bile). Symptoms that get worse with treatment. A fever. A severe headache. Problems with urination or bowel movements, such as: Diarrhea that gets worse or does not go away. Blood in your stool (feces). This may cause stool to look black and tarry. Not urinating, or urinating only a small amount of very dark urine, within 6-8 hours. Trouble  breathing. These symptoms may represent a serious problem that is an emergency. Do not wait to see if the symptoms will go away. Get medical help right away. Call your local emergency services (911 in the [place] Do not drive yourself to the hospital. Summary Dehydration is a condition in which there is not enough water or other fluids in the body. This happens when a person loses more fluids than he or she takes in. Treatment for this condition depends on how severe it is. Treatment should be started right away. Do not wait until dehydration becomes severe. Drink enough clear fluid to keep your urine pale yellow. If you were told to drink an oral rehydration solution (ORS), finish the ORS first and then start slowly drinking other clear fluids. Take over-the-counter and prescription medicines only as told by your health care provider. Get help right away if you have any symptoms of severe dehydration. This information is not intended to replace advice given to you by your health care provider. Make sure you discuss any questions you have with your health care provider. Document Revised: 11/02/2021 Document Reviewed: 02/06/2019 Elsevier Patient Education  2023 Elsevier Inc.  

## 2022-07-29 ENCOUNTER — Inpatient Hospital Stay: Payer: 59

## 2022-07-29 VITALS — BP 122/73 | HR 84 | Temp 98.5°F | Resp 16

## 2022-07-29 DIAGNOSIS — D649 Anemia, unspecified: Secondary | ICD-10-CM | POA: Diagnosis not present

## 2022-07-29 DIAGNOSIS — Z5189 Encounter for other specified aftercare: Secondary | ICD-10-CM | POA: Diagnosis not present

## 2022-07-29 DIAGNOSIS — D696 Thrombocytopenia, unspecified: Secondary | ICD-10-CM | POA: Diagnosis not present

## 2022-07-29 DIAGNOSIS — C50412 Malignant neoplasm of upper-outer quadrant of left female breast: Secondary | ICD-10-CM | POA: Diagnosis not present

## 2022-07-29 DIAGNOSIS — Z5111 Encounter for antineoplastic chemotherapy: Secondary | ICD-10-CM | POA: Diagnosis not present

## 2022-07-29 DIAGNOSIS — Z170 Estrogen receptor positive status [ER+]: Secondary | ICD-10-CM | POA: Diagnosis not present

## 2022-07-29 DIAGNOSIS — Z5112 Encounter for antineoplastic immunotherapy: Secondary | ICD-10-CM | POA: Diagnosis not present

## 2022-07-29 DIAGNOSIS — E876 Hypokalemia: Secondary | ICD-10-CM | POA: Diagnosis not present

## 2022-07-29 MED ORDER — PEGFILGRASTIM-JMDB 6 MG/0.6ML ~~LOC~~ SOSY
6.0000 mg | PREFILLED_SYRINGE | Freq: Once | SUBCUTANEOUS | Status: AC
  Administered 2022-07-29: 6 mg via SUBCUTANEOUS

## 2022-08-03 ENCOUNTER — Inpatient Hospital Stay: Payer: 59

## 2022-08-03 VITALS — BP 108/83 | HR 100 | Resp 16

## 2022-08-03 DIAGNOSIS — E876 Hypokalemia: Secondary | ICD-10-CM | POA: Diagnosis not present

## 2022-08-03 DIAGNOSIS — Z170 Estrogen receptor positive status [ER+]: Secondary | ICD-10-CM | POA: Diagnosis not present

## 2022-08-03 DIAGNOSIS — D696 Thrombocytopenia, unspecified: Secondary | ICD-10-CM | POA: Diagnosis not present

## 2022-08-03 DIAGNOSIS — D649 Anemia, unspecified: Secondary | ICD-10-CM | POA: Diagnosis not present

## 2022-08-03 DIAGNOSIS — C50412 Malignant neoplasm of upper-outer quadrant of left female breast: Secondary | ICD-10-CM | POA: Diagnosis not present

## 2022-08-03 DIAGNOSIS — Z5112 Encounter for antineoplastic immunotherapy: Secondary | ICD-10-CM | POA: Diagnosis not present

## 2022-08-03 DIAGNOSIS — Z5189 Encounter for other specified aftercare: Secondary | ICD-10-CM | POA: Diagnosis not present

## 2022-08-03 DIAGNOSIS — Z5111 Encounter for antineoplastic chemotherapy: Secondary | ICD-10-CM | POA: Diagnosis not present

## 2022-08-03 MED ORDER — FAMOTIDINE IN NACL 20-0.9 MG/50ML-% IV SOLN
20.0000 mg | Freq: Once | INTRAVENOUS | Status: AC
  Administered 2022-08-03: 20 mg via INTRAVENOUS
  Filled 2022-08-03: qty 50

## 2022-08-03 MED ORDER — HEPARIN SOD (PORK) LOCK FLUSH 100 UNIT/ML IV SOLN
500.0000 [IU] | Freq: Once | INTRAVENOUS | Status: AC | PRN
  Administered 2022-08-03: 500 [IU]

## 2022-08-03 MED ORDER — SODIUM CHLORIDE 0.9% FLUSH
10.0000 mL | Freq: Once | INTRAVENOUS | Status: AC | PRN
  Administered 2022-08-03: 10 mL

## 2022-08-03 MED ORDER — ONDANSETRON HCL 4 MG/2ML IJ SOLN
4.0000 mg | Freq: Once | INTRAMUSCULAR | Status: AC
  Administered 2022-08-03: 4 mg via INTRAVENOUS
  Filled 2022-08-03: qty 2

## 2022-08-03 MED ORDER — SODIUM CHLORIDE 0.9 % IV SOLN: Freq: Once | INTRAVENOUS | Status: AC

## 2022-08-13 ENCOUNTER — Other Ambulatory Visit: Payer: Self-pay

## 2022-08-17 ENCOUNTER — Other Ambulatory Visit: Payer: 59

## 2022-08-17 ENCOUNTER — Ambulatory Visit: Payer: 59 | Admitting: Adult Health

## 2022-08-17 ENCOUNTER — Ambulatory Visit: Payer: 59

## 2022-08-18 ENCOUNTER — Encounter: Payer: Self-pay | Admitting: *Deleted

## 2022-08-19 ENCOUNTER — Ambulatory Visit: Payer: 59

## 2022-08-23 MED FILL — Fosaprepitant Dimeglumine For IV Infusion 150 MG (Base Eq): INTRAVENOUS | Qty: 5 | Status: AC

## 2022-08-23 MED FILL — Dexamethasone Sodium Phosphate Inj 100 MG/10ML: INTRAMUSCULAR | Qty: 1 | Status: AC

## 2022-08-24 ENCOUNTER — Inpatient Hospital Stay: Payer: 59

## 2022-08-24 ENCOUNTER — Other Ambulatory Visit: Payer: Self-pay

## 2022-08-24 ENCOUNTER — Encounter: Payer: Self-pay | Admitting: *Deleted

## 2022-08-24 ENCOUNTER — Encounter: Payer: Self-pay | Admitting: Hematology and Oncology

## 2022-08-24 ENCOUNTER — Other Ambulatory Visit: Payer: Self-pay | Admitting: *Deleted

## 2022-08-24 ENCOUNTER — Telehealth: Payer: Self-pay

## 2022-08-24 ENCOUNTER — Inpatient Hospital Stay: Payer: BLUE CROSS/BLUE SHIELD | Attending: Hematology and Oncology

## 2022-08-24 ENCOUNTER — Inpatient Hospital Stay (HOSPITAL_BASED_OUTPATIENT_CLINIC_OR_DEPARTMENT_OTHER): Payer: 59 | Admitting: Hematology and Oncology

## 2022-08-24 VITALS — BP 125/88 | HR 64 | Resp 16

## 2022-08-24 DIAGNOSIS — Z5189 Encounter for other specified aftercare: Secondary | ICD-10-CM | POA: Insufficient documentation

## 2022-08-24 DIAGNOSIS — Z8041 Family history of malignant neoplasm of ovary: Secondary | ICD-10-CM | POA: Diagnosis not present

## 2022-08-24 DIAGNOSIS — Z5112 Encounter for antineoplastic immunotherapy: Secondary | ICD-10-CM | POA: Insufficient documentation

## 2022-08-24 DIAGNOSIS — D696 Thrombocytopenia, unspecified: Secondary | ICD-10-CM | POA: Diagnosis not present

## 2022-08-24 DIAGNOSIS — C50412 Malignant neoplasm of upper-outer quadrant of left female breast: Secondary | ICD-10-CM | POA: Diagnosis not present

## 2022-08-24 DIAGNOSIS — D649 Anemia, unspecified: Secondary | ICD-10-CM | POA: Diagnosis not present

## 2022-08-24 DIAGNOSIS — Z170 Estrogen receptor positive status [ER+]: Secondary | ICD-10-CM | POA: Diagnosis not present

## 2022-08-24 DIAGNOSIS — E876 Hypokalemia: Secondary | ICD-10-CM | POA: Insufficient documentation

## 2022-08-24 DIAGNOSIS — E860 Dehydration: Secondary | ICD-10-CM | POA: Insufficient documentation

## 2022-08-24 DIAGNOSIS — R042 Hemoptysis: Secondary | ICD-10-CM | POA: Diagnosis not present

## 2022-08-24 DIAGNOSIS — R110 Nausea: Secondary | ICD-10-CM | POA: Diagnosis not present

## 2022-08-24 DIAGNOSIS — E878 Other disorders of electrolyte and fluid balance, not elsewhere classified: Secondary | ICD-10-CM | POA: Diagnosis not present

## 2022-08-24 DIAGNOSIS — Z803 Family history of malignant neoplasm of breast: Secondary | ICD-10-CM | POA: Diagnosis not present

## 2022-08-24 DIAGNOSIS — R0602 Shortness of breath: Secondary | ICD-10-CM | POA: Insufficient documentation

## 2022-08-24 DIAGNOSIS — R197 Diarrhea, unspecified: Secondary | ICD-10-CM | POA: Diagnosis not present

## 2022-08-24 DIAGNOSIS — Z5111 Encounter for antineoplastic chemotherapy: Secondary | ICD-10-CM | POA: Diagnosis not present

## 2022-08-24 LAB — CBC WITH DIFFERENTIAL (CANCER CENTER ONLY)
Abs Immature Granulocytes: 0.01 10*3/uL (ref 0.00–0.07)
Basophils Absolute: 0 10*3/uL (ref 0.0–0.1)
Basophils Relative: 1 %
Eosinophils Absolute: 0.1 10*3/uL (ref 0.0–0.5)
Eosinophils Relative: 3 %
HCT: 25.2 % — ABNORMAL LOW (ref 36.0–46.0)
Hemoglobin: 8.9 g/dL — ABNORMAL LOW (ref 12.0–15.0)
Immature Granulocytes: 0 %
Lymphocytes Relative: 20 %
Lymphs Abs: 0.7 10*3/uL (ref 0.7–4.0)
MCH: 34.4 pg — ABNORMAL HIGH (ref 26.0–34.0)
MCHC: 35.3 g/dL (ref 30.0–36.0)
MCV: 97.3 fL (ref 80.0–100.0)
Monocytes Absolute: 0.3 10*3/uL (ref 0.1–1.0)
Monocytes Relative: 9 %
Neutro Abs: 2.4 10*3/uL (ref 1.7–7.7)
Neutrophils Relative %: 67 %
Platelet Count: 159 10*3/uL (ref 150–400)
RBC: 2.59 MIL/uL — ABNORMAL LOW (ref 3.87–5.11)
RDW: 15.7 % — ABNORMAL HIGH (ref 11.5–15.5)
WBC Count: 3.5 10*3/uL — ABNORMAL LOW (ref 4.0–10.5)
nRBC: 0 % (ref 0.0–0.2)

## 2022-08-24 LAB — CMP (CANCER CENTER ONLY)
ALT: 12 U/L (ref 0–44)
AST: 20 U/L (ref 15–41)
Albumin: 3.5 g/dL (ref 3.5–5.0)
Alkaline Phosphatase: 49 U/L (ref 38–126)
Anion gap: 11 (ref 5–15)
BUN: 8 mg/dL (ref 6–20)
CO2: 26 mmol/L (ref 22–32)
Calcium: 8.7 mg/dL — ABNORMAL LOW (ref 8.9–10.3)
Chloride: 105 mmol/L (ref 98–111)
Creatinine: 0.98 mg/dL (ref 0.44–1.00)
GFR, Estimated: >60 mL/min (ref >60)
Glucose, Bld: 127 mg/dL — ABNORMAL HIGH (ref 70–99)
Potassium: 2.6 mmol/L — CL (ref 3.5–5.1)
Sodium: 142 mmol/L (ref 135–145)
Total Bilirubin: 0.7 mg/dL (ref 0.3–1.2)
Total Protein: 6 g/dL — ABNORMAL LOW (ref 6.5–8.1)

## 2022-08-24 MED ORDER — SODIUM CHLORIDE 0.9 % IV SOLN
420.0000 mg | Freq: Once | INTRAVENOUS | Status: AC
  Administered 2022-08-24: 420 mg via INTRAVENOUS
  Filled 2022-08-24: qty 14

## 2022-08-24 MED ORDER — TRASTUZUMAB-ANNS CHEMO 150 MG IV SOLR
450.0000 mg | Freq: Once | INTRAVENOUS | Status: AC
  Administered 2022-08-24: 450 mg via INTRAVENOUS
  Filled 2022-08-24: qty 21.43

## 2022-08-24 MED ORDER — ACETAMINOPHEN 325 MG PO TABS
650.0000 mg | ORAL_TABLET | Freq: Once | ORAL | Status: AC
  Administered 2022-08-24: 650 mg via ORAL
  Filled 2022-08-24: qty 2

## 2022-08-24 MED ORDER — SODIUM CHLORIDE 0.9% FLUSH
10.0000 mL | Freq: Once | INTRAVENOUS | Status: AC
  Administered 2022-08-24: 10 mL

## 2022-08-24 MED ORDER — SODIUM CHLORIDE 0.9% FLUSH
10.0000 mL | INTRAVENOUS | Status: DC | PRN
  Administered 2022-08-24: 10 mL

## 2022-08-24 MED ORDER — SODIUM CHLORIDE 0.9 % IV SOLN
10.0000 mg | Freq: Once | INTRAVENOUS | Status: AC
  Administered 2022-08-24: 10 mg via INTRAVENOUS
  Filled 2022-08-24: qty 10

## 2022-08-24 MED ORDER — SODIUM CHLORIDE 0.9 % IV SOLN
550.0000 mg | Freq: Once | INTRAVENOUS | Status: AC
  Administered 2022-08-24: 550 mg via INTRAVENOUS
  Filled 2022-08-24: qty 55

## 2022-08-24 MED ORDER — SODIUM CHLORIDE 0.9 % IV SOLN
50.0000 mg/m2 | Freq: Once | INTRAVENOUS | Status: AC
  Administered 2022-08-24: 102 mg via INTRAVENOUS
  Filled 2022-08-24: qty 10.2

## 2022-08-24 MED ORDER — POTASSIUM CHLORIDE CRYS ER 20 MEQ PO TBCR: 20.0000 meq | EXTENDED_RELEASE_TABLET | Freq: Two times a day (BID) | ORAL | 0 refills | Status: DC

## 2022-08-24 MED ORDER — POTASSIUM CHLORIDE 10 MEQ/100ML IV SOLN: 10.0000 meq | Freq: Once | INTRAVENOUS | Status: DC

## 2022-08-24 MED ORDER — SODIUM CHLORIDE 0.9 % IV SOLN
150.0000 mg | Freq: Once | INTRAVENOUS | Status: AC
  Administered 2022-08-24: 150 mg via INTRAVENOUS
  Filled 2022-08-24: qty 150

## 2022-08-24 MED ORDER — HEPARIN SOD (PORK) LOCK FLUSH 100 UNIT/ML IV SOLN: 500.0000 [IU] | Freq: Once | INTRAVENOUS | Status: DC | PRN

## 2022-08-24 MED ORDER — SODIUM CHLORIDE 0.9 % IV SOLN: Freq: Once | INTRAVENOUS | Status: AC

## 2022-08-24 MED ORDER — DIPHENHYDRAMINE HCL 25 MG PO CAPS
50.0000 mg | ORAL_CAPSULE | Freq: Once | ORAL | Status: AC
  Administered 2022-08-24: 50 mg via ORAL
  Filled 2022-08-24: qty 2

## 2022-08-24 MED ORDER — PALONOSETRON HCL INJECTION 0.25 MG/5ML
0.2500 mg | Freq: Once | INTRAVENOUS | Status: AC
  Administered 2022-08-24: 0.25 mg via INTRAVENOUS
  Filled 2022-08-24: qty 5

## 2022-08-24 NOTE — Progress Notes (Signed)
Ok to start premeds without CMP results per Dr. Chryl Heck. Patient is out of range for her echo. Doctor aware ok to treat today.

## 2022-08-24 NOTE — Telephone Encounter (Signed)
Potassium 2.6. Prescription sent for Potassium 20 meq BID X 5 days.

## 2022-08-24 NOTE — Progress Notes (Unsigned)
Critical Potassium 2.6 was called and read back with Rondel Baton RN at 1109 am by Dorian Furnace (MT) 08/24/2022

## 2022-08-24 NOTE — Telephone Encounter (Signed)
CRITICAL VALUE STICKER  CRITICAL VALUE:   Potassium 2.6  RECEIVER (on-site recipient of call):  Rondel Baton, LPN  [facility] NOTIFIED: 08/24/22    11:08  MESSENGER (representative from lab):  Rosann Auerbach  MD NOTIFIED: Chryl Heck  TIME OF NOTIFICATION:  11:12  RESPONSE:

## 2022-08-24 NOTE — Patient Instructions (Signed)
[facility]  Discharge Instructions: Thank you for choosing [facility] to provide your oncology and hematology care.   If you have a lab appointment with the [facility], please go directly to the [facility] and check in at the registration area.   Wear comfortable clothing and clothing appropriate for easy access to any Portacath or PICC line.   We strive to give you quality time with your provider. You may need to reschedule your appointment if you arrive late (15 or more minutes).  Arriving late affects you and other patients whose appointments are after yours.  Also, if you miss three or more appointments without notifying the office, you may be dismissed from the clinic at the provider's discretion.      For prescription refill requests, have your pharmacy contact our office and allow 72 hours for refills to be completed.    Today you received the following chemotherapy and/or immunotherapy agents herceptin, perjeta, docetaxel, carboplatin      To help prevent nausea and vomiting after your treatment, we encourage you to take your nausea medication as directed.  BELOW ARE SYMPTOMS THAT SHOULD BE REPORTED IMMEDIATELY: *FEVER GREATER THAN 100.4 F (38 C) OR HIGHER *CHILLS OR SWEATING *NAUSEA AND VOMITING THAT IS NOT CONTROLLED WITH YOUR NAUSEA MEDICATION *UNUSUAL SHORTNESS OF BREATH *UNUSUAL BRUISING OR BLEEDING *URINARY PROBLEMS (pain or burning when urinating, or frequent urination) *BOWEL PROBLEMS (unusual diarrhea, constipation, pain near the anus) TENDERNESS IN MOUTH AND THROAT WITH OR WITHOUT PRESENCE OF ULCERS (sore throat, sores in mouth, or a toothache) UNUSUAL RASH, SWELLING OR PAIN  UNUSUAL VAGINAL DISCHARGE OR ITCHING   Items with * indicate a potential emergency and should be followed up as soon as possible or go to the Emergency Department if any problems should occur.  Please show the CHEMOTHERAPY ALERT CARD or  IMMUNOTHERAPY ALERT CARD at check-in to the Emergency Department and triage nurse.  Should you have questions after your visit or need to cancel or reschedule your appointment, please contact [facility]  Dept: [phone]  and follow the prompts.  Office hours are 8:00 a.m. to 4:30 p.m. Monday - Friday. Please note that voicemails left after 4:00 p.m. may not be returned until the following business day.  We are closed weekends and major holidays. You have access to a nurse at all times for urgent questions. Please call the main number to the clinic Dept: [phone] and follow the prompts.   For any non-urgent questions, you may also contact your provider using MyChart. We now offer e-Visits for anyone 63 and older to request care online for non-urgent symptoms. For details visit [email or web address].   Also download the MyChart app! Go to the app store, search "MyChart", open the app, select [facility], and log in with your MyChart username and password.

## 2022-08-24 NOTE — Progress Notes (Signed)
Per DR Chryl Heck, ok to proceed with chemo today with potassium of 2.6. Will give IV replacement.

## 2022-08-24 NOTE — Progress Notes (Signed)
Symptom Management Consult note [place]    Patient Care Team: Stan[place], Waldemar Dickens, DO as PCP - General (Family Medicine) [name], MD as Consulting Physician (General Surgery) Benay Pike, MD as Consulting Physician (Hematology and Oncology) Kyung Rudd, MD as Consulting Physician (Radiation Oncology) Rockwell Germany, RN as Oncology Nurse Navigator Mauro Kaufmann, RN as Oncology Nurse Navigator    Name of the patient: [name] [name]  [id number]  July 11, 1975   Date of visit: 08/24/2022   Current Therapy: TCHP with pegfilgrastim   Heme/Onc History: Oncology History  Malignant neoplasm of upper-outer quadrant of left breast in female, estrogen receptor positive ([place])  04/25/2022 Imaging   Patient had diagnostic bilateral mammogram given palpable mass in the left breast.  This showed a 2.6 x 3.5 x 3 cm irregular mass as well as possible satellite mass which is indeterminate.  An ultrasound was recommended.  Ultrasound of the left breast mass showed 3.9 x 2.4 x 2.3 cm irregular mass with an irregular and spiculated margin at 3:00 middle depth 5 cm from the nipple, irregular mass is hypoechoic.  In the left breast at 1 o'clock position, 3 cm from the nipple there is a round mass measuring 0.3 x 0.2 x 0.3 cm.  No significant abnormalities were seen sonographically in the left axilla.  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  Left breast needle core biopsy at 1:00 3 cm from the nipple showed fibrocystic changes with usual ductal hyperplasia negative for malignancy.  Prognostic showed ER 95% PR 40%, HER2 3+ KI of 50%   05/08/2022 Initial Diagnosis   Malignant neoplasm of upper-outer quadrant of left breast in female,  estrogen receptor positive ([place])   05/08/2022 Cancer Staging   Staging form: Breast, AJCC 8th Edition - Clinical stage from 05/08/2022: Stage IB (cT2, cN0, cM0, G2, ER+, PR+, HER2+) - Signed by Hayden Pedro, PA-C on 05/08/2022 Stage prefix: Initial diagnosis Method of lymph node assessment: Clinical Histologic grading system: 3 grade system   05/18/2022 -  Chemotherapy   Patient is on Treatment Plan : BREAST  Docetaxel + Carboplatin + Trastuzumab + Pertuzumab  (TCHP) q21d      05/23/2022 Genetic Testing   Negative genetics for [facility] CustomNext-Cancer +RNAinsight Panel.  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 Report date is 05/23/2022.   The CustomNext-Cancer+RNAinsight panel offered by [facility] includes sequencing and rearrangement analysis for the following 47 genes:  APC, ATM, AXIN2, BARD1, BMPR1A, BRCA1, BRCA2, BRIP1, CDH1, CDK4, CDKN2A, CHEK2, DICER1, EPCAM, GREM1, HOXB13, MEN1, MLH1, MSH2, MSH3, MSH6, MUTYH, NBN, NF1, NF2, NTHL1, PALB2, PMS2, POLD1, POLE, PTEN, RAD51C, RAD51D, RECQL, RET, SDHA, SDHAF2, SDHB, SDHC, SDHD, SMAD4, SMARCA4, STK11, TP53, TSC1, TSC2, and VHL.  RNA data is routinely analyzed for use in variant interpretation for all genes.     Interval history-: [name] [name] is a 47 y.o. female currently here for follow up.  She has been feeling ok, just going through personal stress. She has lost some weight 10 lbs. She has been eating more healthy, good appetite. She is working on eating less meat. She denies any diarrhea today. No neuropathy reported. Rest of the pertinent 10 point ROS reviewed and negative.  Wt Readings from Last 3  Encounters:  08/24/22 168 lb 1.6 oz (76.2 kg)  07/27/22 179 lb 6.4 oz (81.4 kg)  07/07/22 183 lb 4.8 oz (83.1 kg)      Allergies  Allergen Reactions   Amoxicillin Shortness Of Breath    rash   Coconut (Cocos Nucifera) Anaphylaxis   Penicillins Shortness Of Breath and Rash    Has patient had a PCN reaction causing  immediate rash, facial/tongue/throat swelling, SOB or lightheadedness with hypotension: yes Has patient had a PCN reaction causing severe rash involving mucus membranes or skin necrosis: no Has patient had a PCN reaction that required hospitalization no Has patient had a PCN reaction occurring within the last 10 years: no If all of the above answers are "NO", then may proceed with Cephalosporin use.    Duloxetine Other (See Comments)    Caused suicidal thoughts   Hydrocodone Itching   Hydrocodone-Acetaminophen Rash   Moxifloxacin Rash     Past Medical History:  Diagnosis Date   Anemia    history of   Breast cancer, left ([place])    Dyspnea    even with speaking   Family history of adverse reaction to anesthesia    mother slow to awaken   Family history of breast cancer 05/10/2022   Family history of ovarian cancer 05/10/2022   Fibromyalgia    GERD (gastroesophageal reflux disease)    History of kidney stones    last stone 6 months ago    Migraine    Seasonal allergies    Tinnitus    Vertigo      Past Surgical History:  Procedure Laterality Date   ADENOIDECTOMY  1991   CYSTOSCOPY WITH RETROGRADE PYELOGRAM, URETEROSCOPY AND STENT PLACEMENT Left 02/22/2021   Procedure: [place], URETEROSCOPY AND STENT PLACEMENT;  Surgeon: Remi Haggard, MD;  Location: [facility];  Service: Urology;  Laterality: Left;   CYSTOSCOPY/URETEROSCOPY/HOLMIUM LASER/STENT PLACEMENT Left 03/10/2021   Procedure: CYSTOSCOPY RETROGRADE, LEFT URETEROSCOPY/ STONE BASKETRY [place] LASER/STENT EXCHANGE;  Surgeon: Ardis Hughs, MD;  Location: [facility];  Service: Urology;  Laterality: Left;   ESOPHAGOGASTRODUODENOSCOPY N/A 11/17/2013   Procedure: ESOPHAGOGASTRODUODENOSCOPY (EGD);  Surgeon: Milus Banister, MD;  Location: [place];  Service: Endoscopy;  Laterality: N/A;   ESOPHAGOGASTRODUODENOSCOPY N/A 10/15/2015   Procedure: ESOPHAGOGASTRODUODENOSCOPY (EGD);   Surgeon: Gatha Mayer, MD;  Location: [facility];  Service: Endoscopy;  Laterality: N/A;   HOLMIUM LASER APPLICATION  [id number]   Procedure: HOLMIUM LASER APPLICATION;  Surgeon: Ardis Hughs, MD;  Location: [facility];  Service: Urology;;   NASAL SEPTOPLASTY W/ TURBINOPLASTY Bilateral 03/18/2018   Procedure: BILATERAL NASAL SEPTOPLASTY WITH TURBINATE REDUCTION;  Surgeon: Jodi Marble, MD;  Location: [place];  Service: ENT;  Laterality: Bilateral;   PORTACATH PLACEMENT Right 05/17/2022   Procedure: PORT-A-CATH INSERTION WITH ULTRASOUND GUIDANCE;  Surgeon: [name], MD;  Location: [facility];  Service: General;  Laterality: Right;   TONSILLECTOMY  1991   TUBAL LIGATION  2005    Social History   Socioeconomic History   Marital status: Single    Spouse name: Not on file   Number of children: Not on file   Years of education: Not on file   Highest education level: Not on file  Occupational History   Not on file  Tobacco Use   Smoking status: Never   Smokeless tobacco: Never  Vaping Use   Vaping Use: Never used  Substance and Sexual Activity   Alcohol use: No  Drug use: No   Sexual activity: Not on file  Other Topics Concern   Not on file  Social History Narrative   ** Merged History Encounter **       Social Determinants of Health   Financial Resource Strain: Low Risk  (05/10/2022)   Overall Financial Resource Strain (CARDIA)    Difficulty of Paying Living Expenses: Not very hard  Food Insecurity: No Food Insecurity (05/10/2022)   Hunger Vital Sign    Worried About Running Out of Food in the Last Year: Never true    Ran Out of Food in the Last Year: Never true  Transportation Needs: No Transportation Needs (05/10/2022)   PRAPARE - Hydrologist (Medical): No    Lack of Transportation (Non-Medical): No  Physical Activity: Not on file  Stress: Not on file  Social Connections: Not on file  Intimate  Partner Violence: Not on file    Family History  Problem Relation Age of Onset   Asthma Mother    Diabetes Mother    Colon polyps Mother    Ovarian cancer Maternal Grandmother 42   Diabetes Maternal Grandmother    Esophageal cancer Maternal Grandfather        d. 41   Breast cancer Other        MGM's sisters x2   Cervical cancer Cousin        mat cousin; dx <40     Current Outpatient Medications:    acetaminophen (TYLENOL) 500 MG tablet, Take 1,000 mg by mouth every 8 (eight) hours as needed for headache., Disp: , Rfl:    cholestyramine (QUESTRAN) 4 g packet, TAKE 1 PACKET (4 G TOTAL) BY MOUTH 2 TIMES DAILY., Disp: 180 packet, Rfl: 1   dexamethasone (DECADRON) 4 MG tablet, Take 2 tabs by mouth 2 times daily starting day before chemo. Then take 2 tabs daily for 2 days starting day after chemo. Take with food., Disp: 30 tablet, Rfl: 1   diphenoxylate-atropine (LOMOTIL) 2.5-0.025 MG tablet, Take 1 tablet by mouth 4 (four) times daily as needed for diarrhea or loose stools., Disp: 30 tablet, Rfl: 0   lidocaine-prilocaine (EMLA) cream, Apply to affected area once, Disp: 30 g, Rfl: 3   ondansetron (ZOFRAN) 8 MG tablet, Take 1 tablet (8 mg total) by mouth every 8 (eight) hours as needed for nausea or vomiting. Start on the third day after chemotherapy., Disp: 30 tablet, Rfl: 1   potassium chloride SA (KLOR-CON M) 20 MEQ tablet, Take 1 tablet (20 mEq total) by mouth 2 (two) times daily for 4 days., Disp: 8 tablet, Rfl: 0   prochlorperazine (COMPAZINE) 10 MG tablet, Take 1 tablet (10 mg total) by mouth every 6 (six) hours as needed for nausea or vomiting., Disp: 30 tablet, Rfl: 1   traMADol (ULTRAM) 50 MG tablet, Take 1 tablet (50 mg total) by mouth every 6 (six) hours as needed for moderate pain or severe pain., Disp: 25 tablet, Rfl: 0  PHYSICAL EXAM: ECOG FS:1 - Symptomatic but completely ambulatory    There were no vitals filed for this visit.   Physical Exam Vitals and nursing note  reviewed.  Constitutional:      Appearance: She is well-developed. She is not ill-appearing or toxic-appearing.  HENT:     Head: Normocephalic.     Nose: Nose normal.  Eyes:     Conjunctiva/sclera: Conjunctivae normal.  Neck:     Vascular: No JVD.  Cardiovascular:     Rate  and Rhythm: Normal rate and regular rhythm.     Pulses: Normal pulses.     Heart sounds: Normal heart sounds.  Pulmonary:     Effort: Pulmonary effort is normal.     Breath sounds: Normal breath sounds.  Chest:       Comments: PAC in right upper chest.  No surrounding erythema or tenderness.  No signs of infection. No palpable left breast mass. She appears to have had complete clinical response.  Abdominal:     General: There is no distension.  Musculoskeletal:     Cervical back: Normal range of motion.  Skin:    General: Skin is warm and dry.  Neurological:     Mental Status: She is oriented to person, place, and time.        LABORATORY DATA: I have reviewed the data as listed    Latest Ref Rng & Units 07/27/2022    8:17 AM 07/07/2022    8:25 AM 06/29/2022    8:46 AM  CBC  WBC 4.0 - 10.5 K/uL 3.0  3.3  3.3   Hemoglobin 12.0 - 15.0 g/dL 9.0  10.2  9.7   Hematocrit 36.0 - 46.0 % 25.4  28.4  28.2   Platelets 150 - 400 K/uL 129  182  184         Latest Ref Rng & Units 07/27/2022    8:17 AM 07/07/2022    8:25 AM 06/29/2022    8:46 AM  CMP  Glucose 70 - 99 mg/dL 119  95  123   BUN 6 - 20 mg/dL 12  11  15   $ Creatinine 0.44 - 1.00 mg/dL 1.04  1.14  1.23   Sodium 135 - 145 mmol/L 142  141  143   Potassium 3.5 - 5.1 mmol/L 2.9  3.2  3.7   Chloride 98 - 111 mmol/L 105  104  106   CO2 22 - 32 mmol/L 30  31  31   $ Calcium 8.9 - 10.3 mg/dL 9.0  9.3  9.2   Total Protein 6.5 - 8.1 g/dL 5.9  6.7  6.2   Total Bilirubin 0.3 - 1.2 mg/dL 0.5  0.6  0.4   Alkaline Phos 38 - 126 U/L 63  56  61   AST 15 - 41 U/L 19  15  24   $ ALT 0 - 44 U/L 21  13  27      $ RADIOGRAPHIC STUDIES (from last 24 hours if  applicable) I have personally reviewed the radiological images as listed and agreed with the findings in the report.  ASSESSMENT & PLAN:  Patient is a 47 y.o. female  with oncologic history of malignant neoplasm of upper-outer quadrant of left breast, ER positive followed by Dr. Chryl Heck.  I have viewed most recent oncology note and lab work.   #)Malignant neoplasm of upper-outer quadrant of left breast, ER positive - She is now post 4 cycles of TCHP. She continues to tolerate it very well. - She had complete clinical response at this time. - sent a message to [facility] about repeating MRI, following up with breast surgery. -she will FU with [facility] Ryder for cycle 6 TCHP  #) Grade 1 diarrhea, on Imodium PRN, only had to take it once. Dose reduced chemo to ensure better tolerance and this has certainly helped  #)AKI, resolved --Clinically patient does not appear dehydrated on exam.  #) Anemia and thrombocytopenia No indication for tranfusion.   All questions were answered. The patient knows  to call the clinic with any problems, questions or concerns. No barriers to learning was detected.  I have spent a total of 30 minutes minutes of face-to-face and non-face-to-face time, preparing to see the patient, obtaining and/or reviewing separately obtained history, performing a medically appropriate examination, counseling and educating the patient, ordering tests, documenting clinical information in the electronic health record, and care coordination (communications with other health care professionals or caregivers).    Benay Pike, MD Department of Hematology/Oncology [facility] Phone: [phone]  [phone]    08/24/2022 9:18 AM

## 2022-08-25 ENCOUNTER — Inpatient Hospital Stay: Payer: 59

## 2022-08-25 VITALS — BP 137/88 | HR 81 | Temp 98.2°F | Resp 18

## 2022-08-25 DIAGNOSIS — Z8041 Family history of malignant neoplasm of ovary: Secondary | ICD-10-CM | POA: Diagnosis not present

## 2022-08-25 DIAGNOSIS — Z170 Estrogen receptor positive status [ER+]: Secondary | ICD-10-CM | POA: Diagnosis not present

## 2022-08-25 DIAGNOSIS — E876 Hypokalemia: Secondary | ICD-10-CM

## 2022-08-25 DIAGNOSIS — E860 Dehydration: Secondary | ICD-10-CM | POA: Diagnosis not present

## 2022-08-25 DIAGNOSIS — R110 Nausea: Secondary | ICD-10-CM | POA: Diagnosis not present

## 2022-08-25 DIAGNOSIS — R197 Diarrhea, unspecified: Secondary | ICD-10-CM | POA: Diagnosis not present

## 2022-08-25 DIAGNOSIS — R042 Hemoptysis: Secondary | ICD-10-CM | POA: Diagnosis not present

## 2022-08-25 DIAGNOSIS — Z5112 Encounter for antineoplastic immunotherapy: Secondary | ICD-10-CM | POA: Diagnosis not present

## 2022-08-25 DIAGNOSIS — D696 Thrombocytopenia, unspecified: Secondary | ICD-10-CM | POA: Diagnosis not present

## 2022-08-25 DIAGNOSIS — Z803 Family history of malignant neoplasm of breast: Secondary | ICD-10-CM | POA: Diagnosis not present

## 2022-08-25 DIAGNOSIS — E878 Other disorders of electrolyte and fluid balance, not elsewhere classified: Secondary | ICD-10-CM | POA: Diagnosis not present

## 2022-08-25 DIAGNOSIS — Z5111 Encounter for antineoplastic chemotherapy: Secondary | ICD-10-CM | POA: Diagnosis not present

## 2022-08-25 DIAGNOSIS — Z5189 Encounter for other specified aftercare: Secondary | ICD-10-CM | POA: Diagnosis not present

## 2022-08-25 DIAGNOSIS — R0602 Shortness of breath: Secondary | ICD-10-CM | POA: Diagnosis not present

## 2022-08-25 DIAGNOSIS — D649 Anemia, unspecified: Secondary | ICD-10-CM | POA: Diagnosis not present

## 2022-08-25 DIAGNOSIS — C50412 Malignant neoplasm of upper-outer quadrant of left female breast: Secondary | ICD-10-CM | POA: Diagnosis not present

## 2022-08-25 MED ORDER — SODIUM CHLORIDE 0.9% FLUSH
10.0000 mL | Freq: Once | INTRAVENOUS | Status: AC | PRN
  Administered 2022-08-25: 10 mL

## 2022-08-25 MED ORDER — HEPARIN SOD (PORK) LOCK FLUSH 100 UNIT/ML IV SOLN
500.0000 [IU] | Freq: Once | INTRAVENOUS | Status: AC | PRN
  Administered 2022-08-25: 500 [IU]

## 2022-08-25 MED ORDER — POTASSIUM CHLORIDE 10 MEQ/100ML IV SOLN
10.0000 meq | INTRAVENOUS | Status: AC
  Administered 2022-08-25 (×3): 10 meq via INTRAVENOUS
  Filled 2022-08-25 (×3): qty 100

## 2022-08-25 MED ORDER — FAMOTIDINE IN NACL 20-0.9 MG/50ML-% IV SOLN
20.0000 mg | Freq: Once | INTRAVENOUS | Status: AC
  Administered 2022-08-25: 20 mg via INTRAVENOUS
  Filled 2022-08-25: qty 50

## 2022-08-25 MED ORDER — SODIUM CHLORIDE 0.9 % IV SOLN: Freq: Once | INTRAVENOUS | Status: AC

## 2022-08-25 NOTE — Patient Instructions (Signed)
Potassium Chloride Injection What is this medication? POTASSIUM CHLORIDE (poe TASS i um KLOOR ide) prevents and treats low levels of potassium in your body. Potassium plays an important role in maintaining the health of your kidneys, heart, muscles, and nervous system. This medicine may be used for other purposes; ask your health care provider or pharmacist if you have questions. COMMON BRAND NAME(S): PROAMP What should I tell my care team before I take this medication? They need to know if you have any of these conditions: Addison disease Dehydration Diabetes (high blood sugar) Heart disease High levels of potassium in the blood Irregular heartbeat or rhythm Kidney disease Large areas of burned skin An unusual or allergic reaction to potassium, other medications, foods, dyes, or preservatives Pregnant or trying to get pregnant Breast-feeding How should I use this medication? This medication is injected into a vein. It is given in a hospital or clinic setting. Talk to your care team about the use of this medication in children. Special care may be needed. Overdosage: If you think you have taken too much of this medicine contact a poison control center or emergency room at once. NOTE: This medicine is only for you. Do not share this medicine with others. What if I miss a dose? This does not apply. This medication is not for regular use. What may interact with this medication? Do not take this medication with any of the following: Certain diuretics, such as spironolactone, triamterene Eplerenone Sodium polystyrene sulfonate This medication may also interact with the following: Certain medications for blood pressure or heart disease, such as lisinopril, losartan, quinapril, valsartan Medications that lower your chance of fighting infection, such as cyclosporine, tacrolimus NSAIDs, medications for pain and inflammation, such as ibuprofen or naproxen Other potassium supplements Salt  substitutes This list may not describe all possible interactions. Give your health care provider a list of all the medicines, herbs, non-prescription drugs, or dietary supplements you use. Also tell them if you smoke, drink alcohol, or use illegal drugs. Some items may interact with your medicine. What should I watch for while using this medication? Visit your care team for regular checks on your progress. Tell your care team if your symptoms do not start to get better or if they get worse. You may need blood work while you are taking this medication. Avoid salt substitutes unless you are told otherwise by your care team. What side effects may I notice from receiving this medication? Side effects that you should report to your care team as soon as possible: Allergic reactions--skin rash, itching, hives, swelling of the face, lips, tongue, or throat High potassium level--muscle weakness, fast or irregular heartbeat Side effects that usually do not require medical attention (report to your care team if they continue or are bothersome): Diarrhea Nausea Stomach pain Vomiting This list may not describe all possible side effects. Call your doctor for medical advice about side effects. You may report side effects to FDA at 1-800-FDA-1088. Where should I keep my medication? This medication is given in a hospital or clinic. It will not be stored at home. NOTE: This sheet is a summary. It may not cover all possible information. If you have questions about this medicine, talk to your doctor, pharmacist, or health care provider.  2023 Elsevier/Gold Standard (2020-10-07 00:00:00)

## 2022-08-26 ENCOUNTER — Inpatient Hospital Stay: Payer: 59

## 2022-08-26 VITALS — BP 126/83 | HR 66 | Temp 98.4°F | Resp 16

## 2022-08-26 DIAGNOSIS — C50412 Malignant neoplasm of upper-outer quadrant of left female breast: Secondary | ICD-10-CM | POA: Diagnosis not present

## 2022-08-26 DIAGNOSIS — D649 Anemia, unspecified: Secondary | ICD-10-CM | POA: Diagnosis not present

## 2022-08-26 DIAGNOSIS — Z5112 Encounter for antineoplastic immunotherapy: Secondary | ICD-10-CM | POA: Diagnosis not present

## 2022-08-26 DIAGNOSIS — R0602 Shortness of breath: Secondary | ICD-10-CM | POA: Diagnosis not present

## 2022-08-26 DIAGNOSIS — D696 Thrombocytopenia, unspecified: Secondary | ICD-10-CM | POA: Diagnosis not present

## 2022-08-26 DIAGNOSIS — R197 Diarrhea, unspecified: Secondary | ICD-10-CM | POA: Diagnosis not present

## 2022-08-26 DIAGNOSIS — E878 Other disorders of electrolyte and fluid balance, not elsewhere classified: Secondary | ICD-10-CM | POA: Diagnosis not present

## 2022-08-26 DIAGNOSIS — E876 Hypokalemia: Secondary | ICD-10-CM | POA: Diagnosis not present

## 2022-08-26 DIAGNOSIS — Z5111 Encounter for antineoplastic chemotherapy: Secondary | ICD-10-CM | POA: Diagnosis not present

## 2022-08-26 DIAGNOSIS — Z803 Family history of malignant neoplasm of breast: Secondary | ICD-10-CM | POA: Diagnosis not present

## 2022-08-26 DIAGNOSIS — R042 Hemoptysis: Secondary | ICD-10-CM | POA: Diagnosis not present

## 2022-08-26 DIAGNOSIS — E860 Dehydration: Secondary | ICD-10-CM | POA: Diagnosis not present

## 2022-08-26 DIAGNOSIS — Z170 Estrogen receptor positive status [ER+]: Secondary | ICD-10-CM | POA: Diagnosis not present

## 2022-08-26 DIAGNOSIS — Z8041 Family history of malignant neoplasm of ovary: Secondary | ICD-10-CM | POA: Diagnosis not present

## 2022-08-26 DIAGNOSIS — Z5189 Encounter for other specified aftercare: Secondary | ICD-10-CM | POA: Diagnosis not present

## 2022-08-26 DIAGNOSIS — R110 Nausea: Secondary | ICD-10-CM | POA: Diagnosis not present

## 2022-08-26 MED ORDER — PEGFILGRASTIM-JMDB 6 MG/0.6ML ~~LOC~~ SOSY
6.0000 mg | PREFILLED_SYRINGE | Freq: Once | SUBCUTANEOUS | Status: AC
  Administered 2022-08-26: 6 mg via SUBCUTANEOUS

## 2022-08-31 ENCOUNTER — Encounter: Payer: Self-pay | Admitting: Hematology and Oncology

## 2022-08-31 ENCOUNTER — Other Ambulatory Visit: Payer: Self-pay | Admitting: Physician Assistant

## 2022-08-31 ENCOUNTER — Inpatient Hospital Stay (HOSPITAL_BASED_OUTPATIENT_CLINIC_OR_DEPARTMENT_OTHER): Payer: 59 | Admitting: Physician Assistant

## 2022-08-31 ENCOUNTER — Emergency Department (HOSPITAL_COMMUNITY): Payer: 59

## 2022-08-31 ENCOUNTER — Inpatient Hospital Stay: Payer: 59

## 2022-08-31 ENCOUNTER — Emergency Department (HOSPITAL_COMMUNITY)
Admission: EM | Admit: 2022-08-31 | Discharge: 2022-08-31 | Disposition: A | Discharge status: Home / Self Care | Payer: 59 | Attending: Emergency Medicine | Admitting: Emergency Medicine

## 2022-08-31 ENCOUNTER — Other Ambulatory Visit: Payer: Self-pay

## 2022-08-31 ENCOUNTER — Other Ambulatory Visit: Payer: Self-pay | Admitting: *Deleted

## 2022-08-31 ENCOUNTER — Encounter: Payer: Self-pay | Admitting: Adult Health

## 2022-08-31 VITALS — BP 105/73 | HR 111 | Temp 98.6°F | Resp 18 | Wt 160.1 lb

## 2022-08-31 DIAGNOSIS — Z170 Estrogen receptor positive status [ER+]: Secondary | ICD-10-CM

## 2022-08-31 DIAGNOSIS — Z5111 Encounter for antineoplastic chemotherapy: Secondary | ICD-10-CM | POA: Diagnosis not present

## 2022-08-31 DIAGNOSIS — E876 Hypokalemia: Secondary | ICD-10-CM | POA: Insufficient documentation

## 2022-08-31 DIAGNOSIS — Z853 Personal history of malignant neoplasm of breast: Secondary | ICD-10-CM | POA: Diagnosis not present

## 2022-08-31 DIAGNOSIS — D702 Other drug-induced agranulocytosis: Secondary | ICD-10-CM

## 2022-08-31 DIAGNOSIS — T451X5A Adverse effect of antineoplastic and immunosuppressive drugs, initial encounter: Secondary | ICD-10-CM

## 2022-08-31 DIAGNOSIS — R0602 Shortness of breath: Secondary | ICD-10-CM

## 2022-08-31 DIAGNOSIS — K521 Toxic gastroenteritis and colitis: Secondary | ICD-10-CM

## 2022-08-31 DIAGNOSIS — C50412 Malignant neoplasm of upper-outer quadrant of left female breast: Secondary | ICD-10-CM

## 2022-08-31 DIAGNOSIS — R7989 Other specified abnormal findings of blood chemistry: Secondary | ICD-10-CM | POA: Diagnosis not present

## 2022-08-31 DIAGNOSIS — R042 Hemoptysis: Secondary | ICD-10-CM | POA: Diagnosis not present

## 2022-08-31 LAB — CBC WITH DIFFERENTIAL (CANCER CENTER ONLY)
Abs Immature Granulocytes: 0.06 10*3/uL (ref 0.00–0.07)
Basophils Absolute: 0 10*3/uL (ref 0.0–0.1)
Basophils Relative: 1 %
Eosinophils Absolute: 0.1 10*3/uL (ref 0.0–0.5)
Eosinophils Relative: 1 %
HCT: 26.6 % — ABNORMAL LOW (ref 36.0–46.0)
Hemoglobin: 9.9 g/dL — ABNORMAL LOW (ref 12.0–15.0)
Immature Granulocytes: 1 %
Lymphocytes Relative: 16 %
Lymphs Abs: 0.7 10*3/uL (ref 0.7–4.0)
MCH: 34.7 pg — ABNORMAL HIGH (ref 26.0–34.0)
MCHC: 37.2 g/dL — ABNORMAL HIGH (ref 30.0–36.0)
MCV: 93.3 fL (ref 80.0–100.0)
Monocytes Absolute: 0.5 10*3/uL (ref 0.1–1.0)
Monocytes Relative: 10 %
Neutro Abs: 3.1 10*3/uL (ref 1.7–7.7)
Neutrophils Relative %: 71 %
Platelet Count: 101 10*3/uL — ABNORMAL LOW (ref 150–400)
RBC: 2.85 MIL/uL — ABNORMAL LOW (ref 3.87–5.11)
RDW: 12.8 % (ref 11.5–15.5)
Smear Review: NORMAL
WBC Count: 4.4 10*3/uL (ref 4.0–10.5)
nRBC: 0 % (ref 0.0–0.2)

## 2022-08-31 LAB — MAGNESIUM: Magnesium: 1.3 mg/dL — ABNORMAL LOW (ref 1.7–2.4)

## 2022-08-31 LAB — CMP (CANCER CENTER ONLY)
ALT: 25 U/L (ref 0–44)
AST: 20 U/L (ref 15–41)
Albumin: 4.4 g/dL (ref 3.5–5.0)
Alkaline Phosphatase: 85 U/L (ref 38–126)
Anion gap: 12 (ref 5–15)
BUN: 12 mg/dL (ref 6–20)
CO2: 27 mmol/L (ref 22–32)
Calcium: 9.7 mg/dL (ref 8.9–10.3)
Chloride: 97 mmol/L — ABNORMAL LOW (ref 98–111)
Creatinine: 0.91 mg/dL (ref 0.44–1.00)
GFR, Estimated: >60 mL/min (ref >60)
Glucose, Bld: 112 mg/dL — ABNORMAL HIGH (ref 70–99)
Potassium: 2.9 mmol/L — ABNORMAL LOW (ref 3.5–5.1)
Sodium: 136 mmol/L (ref 135–145)
Total Bilirubin: 0.8 mg/dL (ref 0.3–1.2)
Total Protein: 7.5 g/dL (ref 6.5–8.1)

## 2022-08-31 LAB — TROPONIN I (HIGH SENSITIVITY)
Troponin I (High Sensitivity): 3 ng/L (ref <18)
Troponin I (High Sensitivity): 3 ng/L (ref <18)

## 2022-08-31 LAB — I-STAT BETA HCG BLOOD, ED (MC, WL, AP ONLY): I-stat hCG, quantitative: <5 m[IU]/mL (ref <5)

## 2022-08-31 LAB — SAMPLE TO BLOOD BANK

## 2022-08-31 LAB — D-DIMER, QUANTITATIVE: D-Dimer, Quant: 0.54 ug/mL-FEU — ABNORMAL HIGH (ref 0.00–0.50)

## 2022-08-31 MED ORDER — ONDANSETRON HCL 4 MG/2ML IJ SOLN: 4.0000 mg | Freq: Once | INTRAMUSCULAR | Status: DC

## 2022-08-31 MED ORDER — POTASSIUM CHLORIDE 10 % PO SOLN: 20.0000 meq | Freq: Two times a day (BID) | ORAL | 0 refills | Status: DC

## 2022-08-31 MED ORDER — POTASSIUM CHLORIDE 10 MEQ/100ML IV SOLN
10.0000 meq | INTRAVENOUS | Status: AC
  Administered 2022-08-31 (×2): 10 meq via INTRAVENOUS
  Filled 2022-08-31 (×2): qty 100

## 2022-08-31 MED ORDER — POTASSIUM CHLORIDE 20 MEQ PO PACK
40.0000 meq | PACK | Freq: Once | ORAL | Status: AC
  Administered 2022-08-31: 40 meq via ORAL
  Filled 2022-08-31: qty 2

## 2022-08-31 MED ORDER — MAGNESIUM OXIDE -MG SUPPLEMENT 400 (240 MG) MG PO TABS: 400.0000 mg | ORAL_TABLET | Freq: Every day | ORAL | 0 refills | Status: AC

## 2022-08-31 MED ORDER — IOHEXOL 350 MG/ML SOLN
75.0000 mL | Freq: Once | INTRAVENOUS | Status: AC | PRN
  Administered 2022-08-31: 75 mL via INTRAVENOUS

## 2022-08-31 MED ORDER — SODIUM CHLORIDE 0.9 % IV BOLUS
1000.0000 mL | Freq: Once | INTRAVENOUS | Status: AC
  Administered 2022-08-31: 1000 mL via INTRAVENOUS

## 2022-08-31 MED ORDER — SODIUM CHLORIDE (PF) 0.9 % IJ SOLN
INTRAMUSCULAR | Status: AC
  Filled 2022-08-31: qty 50

## 2022-08-31 MED ORDER — SODIUM CHLORIDE 0.9 % IV SOLN: Freq: Once | INTRAVENOUS | Status: AC

## 2022-08-31 NOTE — Patient Instructions (Signed)

## 2022-08-31 NOTE — Discharge Instructions (Signed)
Please return to the ED with any new or worsening signs or symptoms Please follow-up with cancer center for further management Please begin taking oral potassium I have sent in to your pharmacy.  Please take 20 mEq once in the morning once at night. Please read the attached guide concerning hemoptysis

## 2022-08-31 NOTE — Progress Notes (Signed)
PO magnesium replacement prescribed. Also sent referral to spiritual care as patient is going through stressful time with treatment and ending relationship with fiance.

## 2022-08-31 NOTE — ED Provider Notes (Signed)
[facility] Provider Note   CSN: [id number] Arrival date & time: 08/31/22  1346     History  Chief Complaint  Patient presents with   Hemoptysis    [name] [name] is a 47 y.o. female medical history of breast cancer, dyspnea, polymyalgia, GERD, vertigo.  Patient presents to ED for evaluation.  Patient reports that beginning Saturday she developed lightheadedness and dizziness even when sitting down.  Patient reports that this persisted into Sunday and Monday.  Patient states that on Tuesday she had an episode of chest pain that lasted for about 1 minute, rated 8 out of 10.  Patient reports that she is also been short of breath since this time.  Patient reports that she has been coughing up blood recently, she is concerned about a blood clot.  The patient denies a history of blood clots.  The patient is currently receiving radiation therapy through [facility] for breast cancer.  Patient states she has been nauseous and vomits when she has to take a potassium pill however denies any abdominal pain, diarrhea, dysuria.  On examination the patient denies any chest pain or shortness of breath.  The patient denies any leg swelling.  Patient was seen earlier today at the cancer center and had labs drawn to include CBC, CMP, magnesium.  Patient sent down to ED for evaluation of shortness of breath, hemoptysis.  Patient denies any recent fevers.  HPI     Home Medications Prior to Admission medications   Medication Sig Start Date End Date Taking? Authorizing Provider  Potassium Chloride 10 % SOLN Take 20 mEq by mouth in the morning and at bedtime. 08/31/22  Yes Azucena Cecil, PA-C  acetaminophen (TYLENOL) 500 MG tablet Take 1,000 mg by mouth every 8 (eight) hours as needed for headache.    [provider]  cholestyramine (QUESTRAN) 4 g packet TAKE 1 PACKET (4 G TOTAL) BY MOUTH 2 TIMES DAILY. 06/19/22   Causey, Charlestine Massed, NP   dexamethasone (DECADRON) 4 MG tablet Take 2 tabs by mouth 2 times daily starting day before chemo. Then take 2 tabs daily for 2 days starting day after chemo. Take with food. 05/16/22   Benay Pike, MD  diphenoxylate-atropine (LOMOTIL) 2.5-0.025 MG tablet Take 1 tablet by mouth 4 (four) times daily as needed for diarrhea or loose stools. 07/12/22   Benay Pike, MD  lidocaine-prilocaine (EMLA) cream Apply to affected area once 05/16/22   Benay Pike, MD  ondansetron (ZOFRAN) 8 MG tablet Take 1 tablet (8 mg total) by mouth every 8 (eight) hours as needed for nausea or vomiting. Start on the third day after chemotherapy. 05/16/22   Benay Pike, MD  potassium chloride SA (KLOR-CON M) 20 MEQ tablet Take 1 tablet (20 mEq total) by mouth 2 (two) times daily. Take for 5 days 08/24/22   Benay Pike, MD  prochlorperazine (COMPAZINE) 10 MG tablet Take 1 tablet (10 mg total) by mouth every 6 (six) hours as needed for nausea or vomiting. 05/16/22   Benay Pike, MD  traMADol (ULTRAM) 50 MG tablet Take 1 tablet (50 mg total) by mouth every 6 (six) hours as needed for moderate pain or severe pain. 05/17/22   [name], MD      Allergies    Amoxicillin, Coconut (cocos nucifera), Penicillins, Duloxetine, Hydrocodone, Hydrocodone-acetaminophen, and Moxifloxacin    Review of Systems   Review of Systems  Constitutional:  Negative for fever.  Respiratory:  Positive for cough  and shortness of breath.   Cardiovascular:  Positive for chest pain. Negative for leg swelling.  Gastrointestinal:  Positive for nausea and vomiting. Negative for abdominal pain and diarrhea.  Genitourinary:  Negative for dysuria.  Neurological:  Positive for dizziness and light-headedness.  All other systems reviewed and are negative.   Physical Exam Updated Vital Signs BP 120/74   Pulse (!) 102   Temp 98.3 F (36.8 C) (Oral)   Resp 20   Ht '5\' 8"'$  (1.727 m)   Wt 72.6 kg   SpO2 93%   BMI 24.33 kg/m  Physical  Exam Vitals and nursing note reviewed.  Constitutional:      General: She is not in acute distress.    Appearance: Normal appearance. She is not ill-appearing, toxic-appearing or diaphoretic.  HENT:     Head: Normocephalic and atraumatic.     Nose: Nose normal. No congestion.     Mouth/Throat:     Mouth: Mucous membranes are moist.     Pharynx: Oropharynx is clear.  Eyes:     Extraocular Movements: Extraocular movements intact.     Conjunctiva/sclera: Conjunctivae normal.     Pupils: Pupils are equal, round, and reactive to light.  Cardiovascular:     Rate and Rhythm: Normal rate and regular rhythm.  Pulmonary:     Effort: Pulmonary effort is normal.     Breath sounds: Normal breath sounds. No wheezing.  Abdominal:     General: Abdomen is flat. Bowel sounds are normal.     Palpations: Abdomen is soft.     Tenderness: There is no abdominal tenderness.  Musculoskeletal:     Cervical back: Normal range of motion and neck supple. No tenderness.     Right lower leg: No edema.     Left lower leg: No edema.  Skin:    General: Skin is warm and dry.     Capillary Refill: Capillary refill takes less than 2 seconds.  Neurological:     Mental Status: She is alert and oriented to person, place, and time.     ED Results / Procedures / Treatments   Labs (all labs ordered are listed, but only abnormal results are displayed) Labs Reviewed  D-DIMER, QUANTITATIVE - Abnormal; Notable for the following components:      Result Value   D-Dimer, Quant 0.54 (*)    All other components within normal limits  I-STAT BETA HCG BLOOD, ED ([facility], [facility], [facility] ONLY)  TROPONIN I (HIGH SENSITIVITY)  TROPONIN I (HIGH SENSITIVITY)    EKG None  Radiology CT Angio Chest PE W and/or Wo Contrast  Result Date: 08/31/2022 CLINICAL DATA:  Pulmonary embolism (PE) suspected, low to intermediate prob, positive D-dimer EXAM: CT ANGIOGRAPHY CHEST WITH CONTRAST TECHNIQUE: Multidetector CT imaging of the chest was  performed using the standard protocol during bolus administration of intravenous contrast. Multiplanar CT image reconstructions and MIPs were obtained to evaluate the vascular anatomy. RADIATION DOSE REDUCTION: This exam was performed according to the departmental dose-optimization program which includes automated exposure control, adjustment of the mA and/or kV according to patient size and/or use of iterative reconstruction technique. CONTRAST:  17m OMNIPAQUE IOHEXOL 350 MG/ML SOLN COMPARISON:  Chest x-ray December 12, 2008. FINDINGS: Cardiovascular: Satisfactory opacification of the pulmonary arteries to the segmental level. No evidence of pulmonary embolism. Normal heart size. No pericardial effusion. Mediastinum/Nodes: No enlarged mediastinal, hilar, or axillary lymph nodes. Thyroid gland, trachea, and esophagus demonstrate no significant findings. Lungs/Pleura: Lungs are clear. No pleural effusion or pneumothorax.  Upper Abdomen: No acute abnormality. Musculoskeletal: No chest wall abnormality. No acute or significant osseous findings. Review of the MIP images confirms the above findings. IMPRESSION: 1. No evidence of acute pulmonary embolism. 2. Clear lungs. Electronically Signed   By: Margaretha Sheffield M.D.   On: 08/31/2022 18:21    Procedures Procedures   Medications Ordered in ED Medications  sodium chloride (PF) 0.9 % injection (  Not Given 08/31/22 1833)  sodium chloride 0.9 % bolus 1,000 mL (1,000 mLs Intravenous New Bag/Given 08/31/22 1453)  potassium chloride 10 mEq in 100 mL IVPB (0 mEq Intravenous Stopped 08/31/22 1759)  iohexol (OMNIPAQUE) 350 MG/ML injection 75 mL (75 mLs Intravenous Contrast Given 08/31/22 1810)  potassium chloride (KLOR-CON) packet 40 mEq (40 mEq Oral Given 08/31/22 1833)    ED Course/ Medical Decision Making/ A&P  Medical Decision Making Amount and/or Complexity of Data Reviewed Labs: ordered. Radiology: ordered.  Risk Prescription drug management.   47 year old  female presents to the ED for evaluation.  Please see HPI for further details.  Patient on examination is afebrile, nontachycardic.  Lung sounds are clear bilaterally, she is not hypoxic.  Abdomen soft and compressible throughout.  Neurological examination shows no focal neurodeficits.  Patient labs collected earlier today include CBC with differential, CMP, magnesium.  I have added on D-dimer, troponin x 2, i-STAT beta-hCG.  Result: CBC with hemoglobin of 9.9, baseline.  No leukocytosis.  CMP shows decreased potassium to 2.9 which was repleted with 40 mEq of oral solution potassium, 20 mEq of IV potassium.  Patient reports she has potassium at home however unable to keep this down secondary to nausea and vomiting so we will send patient home with oral potassium packets.  D-dimer elevated at 0.54 so we will proceed with CT angio of chest.  Troponin 3 and 3 respectively.  CT angio shows no evidence of pulmonary embolism or other pulmonary abnormalities.  Patient received 1 L IV fluid and department for dehydration.  On chart review, it appears the patient received 4 mg Zofran and 1 L fluid at the cancer center.  At this time, the patient will be discharged.  The patient was given her return precautions and she voiced understanding.  Patient was ambulated and showed no evidence of desaturation on room air.  Patient advised to follow-up with cancer center.  Patient again encouraged to return to the ED with any new or worsening signs or symptoms.  Patient voiced understanding.  Patient had all her questions answered to her satisfaction.  Patient stable for discharge.   Final Clinical Impression(s) / ED Diagnoses Final diagnoses:  Hemoptysis    Rx / DC Orders ED Discharge Orders          Ordered    Potassium Chloride 10 % SOLN  2 times daily        08/31/22 1910              Lawana Chambers 08/31/22 1911    Carmin Muskrat, MD 09/05/22 1313

## 2022-08-31 NOTE — ED Triage Notes (Signed)
PT coming to the ER regarding concerns for a PE due to the pt coughing up blood. PT endorses SOB, denies any chest pain at this time.

## 2022-08-31 NOTE — Progress Notes (Signed)
Symptom Management Consult note [place]    Patient Care Team: Grove, Waldemar Dickens, DO as PCP - General (Family Medicine) [name], MD as Consulting Physician (General Surgery) Benay Pike, MD as Consulting Physician (Hematology and Oncology) Kyung Rudd, MD as Consulting Physician (Radiation Oncology) Rockwell Germany, RN as Oncology Nurse Navigator Mauro Kaufmann, RN as Oncology Nurse Navigator    Name / MRN / DOB: [name] [name]  [id number]  Mar 23, 1976   Date of visit: 08/31/2022   Chief Complaint/Reason for visit: shortness of breath, nausea and diarrhea   Current Therapy: TCHP with pegfilgrastim   Last treatment:  Day 1   Cycle 5 on 08/24/22   ASSESSMENT & PLAN: Patient is a 47 y.o. female  with oncologic history of malignant neoplasm of upper-outer quadrant of left breast, ER positive followed by Dr. Chryl Heck.  I have viewed most recent oncology note and lab work.    #malignant neoplasm of upper-outer quadrant of left breast, ER positive  - Next appointment with oncologist is 09/14/22   #Symptom management: Shortness of breath, hemoptysis, tachycardia, dehydration, diarrhea -Patient afebrile, tachycardic HR 11-120s during triage. She looks to not feel well although is nontoxic. Patient clinically dehydrated. Weight is down 8 pounds in 7 days. -CBC without leukocytosis, stable anemia, does have thrombocytopenia 101k secondary to chemo. Patient does not need transfusion support at this time.  -Lung exam is clear.  -Patient given 1L IVF in clinic for hydration support and zofran. - Patient has been experiencing diarrhea during chemotherapy. She has questran at home she can take it needed. -Hemoptysis could be from forceful coughing possibly, mallory-weiss tear. -Engaged in shared decision making with patient regarding workup. Her symptoms are concerning for PE needing a CTA. I can arrange for this to happen in the outpatient setting in the next  24-48 hours depending on how long it takes for insurance approval. Patient feels so poorly that she needs ED work up at this time. -Patient taken to the ED by myself and NT. Report given to accepting RN.   #Electrolyte derangement CMP shows hypokalemia 2.9. Also hypomagnesemia 1.3.  -Patient does not wish to have IV magnesium as she had severe headache after receiving it in the past.  -As patient will be taken to the ED potassium was not given in clinic. She has PO potassium at home although has not been tolerating it secondary to nausea.     Heme/Onc History: Oncology History  Malignant neoplasm of upper-outer quadrant of left breast in female, estrogen receptor positive ([place])  04/25/2022 Imaging   Patient had diagnostic bilateral mammogram given palpable mass in the left breast.  This showed a 2.6 x 3.5 x 3 cm irregular mass as well as possible satellite mass which is indeterminate.  An ultrasound was recommended.  Ultrasound of the left breast mass showed 3.9 x 2.4 x 2.3 cm irregular mass with an irregular and spiculated margin at 3:00 middle depth 5 cm from the nipple, irregular mass is hypoechoic.  In the left breast at 1 o'clock position, 3 cm from the nipple there is a round mass measuring 0.3 x 0.2 x 0.3 cm.  No significant abnormalities were seen sonographically in the left axilla.  There is another heterogeneous shadowing present at 3 o'clock position 3 cm from the nipple measuring 8 x 8 x 11 mm possible correlate for mammographic finding   05/02/2022 Pathology Results   Left breast needle core biopsy showed invasive ductal carcinoma, grade  2, DCIS with necrosis.  Left breast needle core biopsy at 1:00 3 cm from the nipple showed fibrocystic changes with usual ductal hyperplasia negative for malignancy.  Prognostic showed ER 95% PR 40%, HER2 3+ KI of 50%   05/08/2022 Initial Diagnosis   Malignant neoplasm of upper-outer quadrant of left breast in female, estrogen receptor positive  ([place])   05/08/2022 Cancer Staging   Staging form: Breast, AJCC 8th Edition - Clinical stage from 05/08/2022: Stage IB (cT2, cN0, cM0, G2, ER+, PR+, HER2+) - Signed by Hayden Pedro, PA-C on 05/08/2022 Stage prefix: Initial diagnosis Method of lymph node assessment: Clinical Histologic grading system: 3 grade system   05/18/2022 -  Chemotherapy   Patient is on Treatment Plan : BREAST  Docetaxel + Carboplatin + Trastuzumab + Pertuzumab  (TCHP) q21d      05/23/2022 Genetic Testing   Negative genetics for [facility] CustomNext-Cancer +RNAinsight Panel.  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 Report date is 05/23/2022.   The CustomNext-Cancer+RNAinsight panel offered by [facility] includes sequencing and rearrangement analysis for the following 47 genes:  APC, ATM, AXIN2, BARD1, BMPR1A, BRCA1, BRCA2, BRIP1, CDH1, CDK4, CDKN2A, CHEK2, DICER1, EPCAM, GREM1, HOXB13, MEN1, MLH1, MSH2, MSH3, MSH6, MUTYH, NBN, NF1, NF2, NTHL1, PALB2, PMS2, POLD1, POLE, PTEN, RAD51C, RAD51D, RECQL, RET, SDHA, SDHAF2, SDHB, SDHC, SDHD, SMAD4, SMARCA4, STK11, TP53, TSC1, TSC2, and VHL.  RNA data is routinely analyzed for use in variant interpretation for all genes.       Interval history-: [name] [name] is a 47 y.o. female with oncologic history as above presenting to [facility] today with chief complaint of shortness of breath, nausea and diarrhea. Patient is accompanied to clinic by her ex fianc.  She gives permission for him to be present during clinic visit.  Patient states her symptoms have been ongoing x 7 days.  She reports her nausea is persistent and she cannot tolerate any food by mouth or even taking her medications.  She has tried taking nausea medication without symptom improvement.  She states she has not eaten solid food in 1 week.  She drinks approximately 40 ounces of fluid daily.  She has noticed that her urine is dark in color which usually happens when she is dehydrated.  She denies any urinary tract  infection symptoms.  She also endorses shortness of breath.  She states she is having difficulty walking approximately 10 feet without having to stop and catch her breath.  This is unusual for her.  States for the last 3 days she has been coughing up blood.  She reports coughing fits and seeing phlegm streaked with blood.  Yesterday she coughed up 3 dime sized blood clots.  She denies any history of blood clots.  She had 1 episode of sharp centralized chest pain x 2 days ago while laying down that caused her to sit straight up. The pain lasted approximately 1 minute and resolved after she repositioned herself. She reports 4 episodes of watery diarrhea in the last 24 hours. She did not try taking imodium or the [place] she has at home. She denies any fever or sick contacts.   ROS  All other systems are reviewed and are negative for acute change except as noted in the HPI.    Allergies  Allergen Reactions   Amoxicillin Shortness Of Breath    rash   Coconut (Cocos Nucifera) Anaphylaxis   Penicillins Shortness Of Breath and Rash    Has patient had a PCN reaction causing immediate rash,  facial/tongue/throat swelling, SOB or lightheadedness with hypotension: yes Has patient had a PCN reaction causing severe rash involving mucus membranes or skin necrosis: no Has patient had a PCN reaction that required hospitalization no Has patient had a PCN reaction occurring within the last 10 years: no If all of the above answers are "NO", then may proceed with Cephalosporin use.    Duloxetine Other (See Comments)    Caused suicidal thoughts   Hydrocodone Itching   Hydrocodone-Acetaminophen Rash   Moxifloxacin Rash     Past Medical History:  Diagnosis Date   Anemia    history of   Breast cancer, left ([place])    Dyspnea    even with speaking   Family history of adverse reaction to anesthesia    mother slow to awaken   Family history of breast cancer 05/10/2022   Family history of ovarian cancer  05/10/2022   Fibromyalgia    GERD (gastroesophageal reflux disease)    History of kidney stones    last stone 6 months ago    Migraine    Seasonal allergies    Tinnitus    Vertigo      Past Surgical History:  Procedure Laterality Date   ADENOIDECTOMY  1991   CYSTOSCOPY WITH RETROGRADE PYELOGRAM, URETEROSCOPY AND STENT PLACEMENT Left 02/22/2021   Procedure: [place], URETEROSCOPY AND STENT PLACEMENT;  Surgeon: Remi Haggard, MD;  Location: [facility];  Service: Urology;  Laterality: Left;   CYSTOSCOPY/URETEROSCOPY/HOLMIUM LASER/STENT PLACEMENT Left 03/10/2021   Procedure: CYSTOSCOPY RETROGRADE, LEFT URETEROSCOPY/ STONE BASKETRY [place] LASER/STENT EXCHANGE;  Surgeon: Ardis Hughs, MD;  Location[facility];  Service: Urology;  Laterality: Left;   ESOPHAGOGASTRODUODENOSCOPY N/A 11/17/2013   Procedure: ESOPHAGOGASTRODUODENOSCOPY (EGD);  Surgeon: Milus Banister, MD;  Location: [place];  Service: Endoscopy;  Laterality: N/A;   ESOPHAGOGASTRODUODENOSCOPY N/A 10/15/2015   Procedure: ESOPHAGOGASTRODUODENOSCOPY (EGD);  Surgeon: Gatha Mayer, MD;  [facility];  Service: Endoscopy;  Laterality: N/A;   HOLMIUM LASER APPLICATION  [id number]   Procedure: HOLMIUM LASER APPLICATION;  Surgeon: Ardis Hughs, MD;  Location: [facility];  Service: Urology;;   NASAL SEPTOPLASTY W/ TURBINOPLASTY Bilateral 03/18/2018   Procedure: BILATERAL NASAL SEPTOPLASTY WITH TURBINATE REDUCTION;  Surgeon: Jodi Marble, MD;  Location: [place];  Service: ENT;  Laterality: Bilateral;   PORTACATH PLACEMENT Right 05/17/2022   Procedure: PORT-A-CATH INSERTION WITH ULTRASOUND GUIDANCE;  Surgeon: [name], MD;  Location: [facility];  Service: General;  Laterality: Right;   TONSILLECTOMY  1991   TUBAL LIGATION  2005    Social History   Socioeconomic History   Marital status: Single    Spouse name: Not on file    Number of children: Not on file   Years of education: Not on file   Highest education level: Not on file  Occupational History   Not on file  Tobacco Use   Smoking status: Never   Smokeless tobacco: Never  Vaping Use   Vaping Use: Never used  Substance and Sexual Activity   Alcohol use: No   Drug use: No   Sexual activity: Not on file  Other Topics Concern   Not on file  Social History Narrative   ** Merged History Encounter **       Social Determinants of Health   Financial Resource Strain: Low Risk  (05/10/2022)   Overall Financial Resource Strain (CARDIA)    Difficulty of Paying Living Expenses: Not very hard  Food  Insecurity: No Food Insecurity (05/10/2022)   Hunger Vital Sign    Worried About Running Out of Food in the Last Year: Never true    Ran Out of Food in the Last Year: Never true  Transportation Needs: No Transportation Needs (05/10/2022)   PRAPARE - Hydrologist (Medical): No    Lack of Transportation (Non-Medical): No  Physical Activity: Not on file  Stress: Not on file  Social Connections: Not on file  Intimate Partner Violence: Not on file    Family History  Problem Relation Age of Onset   Asthma Mother    Diabetes Mother    Colon polyps Mother    Ovarian cancer Maternal Grandmother 51   Diabetes Maternal Grandmother    Esophageal cancer Maternal Grandfather        d. 61   Breast cancer Other        MGM's sisters x2   Cervical cancer Cousin        mat cousin; dx <40    No current facility-administered medications for this visit.  Current Outpatient Medications:    acetaminophen (TYLENOL) 500 MG tablet, Take 1,000 mg by mouth every 8 (eight) hours as needed for headache., Disp: , Rfl:    cholestyramine (QUESTRAN) 4 g packet, TAKE 1 PACKET (4 G TOTAL) BY MOUTH 2 TIMES DAILY., Disp: 180 packet, Rfl: 1   dexamethasone (DECADRON) 4 MG tablet, Take 2 tabs by mouth 2 times daily starting day before chemo. Then take 2 tabs  daily for 2 days starting day after chemo. Take with food., Disp: 30 tablet, Rfl: 1   diphenoxylate-atropine (LOMOTIL) 2.5-0.025 MG tablet, Take 1 tablet by mouth 4 (four) times daily as needed for diarrhea or loose stools., Disp: 30 tablet, Rfl: 0   lidocaine-prilocaine (EMLA) cream, Apply to affected area once, Disp: 30 g, Rfl: 3   ondansetron (ZOFRAN) 8 MG tablet, Take 1 tablet (8 mg total) by mouth every 8 (eight) hours as needed for nausea or vomiting. Start on the third day after chemotherapy., Disp: 30 tablet, Rfl: 1   potassium chloride SA (KLOR-CON M) 20 MEQ tablet, Take 1 tablet (20 mEq total) by mouth 2 (two) times daily. Take for 5 days, Disp: 10 tablet, Rfl: 0   prochlorperazine (COMPAZINE) 10 MG tablet, Take 1 tablet (10 mg total) by mouth every 6 (six) hours as needed for nausea or vomiting., Disp: 30 tablet, Rfl: 1   traMADol (ULTRAM) 50 MG tablet, Take 1 tablet (50 mg total) by mouth every 6 (six) hours as needed for moderate pain or severe pain., Disp: 25 tablet, Rfl: 0  Facility-Administered Medications Ordered in Other Visits:    ondansetron (ZOFRAN) injection 4 mg, 4 mg, Intravenous, Once, Iruku, Praveena, MD   potassium chloride 10 mEq in 100 mL IVPB, 10 mEq, Intravenous, Q1 Hr x 2, Groce, Christopher F, PA-C, Last Rate: 100 mL/hr at 08/31/22 1609, 10 mEq at 08/31/22 1609  PHYSICAL EXAM: ECOG FS:1 - Symptomatic but completely ambulatory    Vitals:   08/31/22 1222  BP: 105/73  Pulse: (!) 111  Resp: 18  Temp: 98.6 F (37 C)  TempSrc: Oral  SpO2: 100%  Weight: 160 lb 1.6 oz (72.6 kg)   Physical Exam Vitals and nursing note reviewed.  Constitutional:      Appearance: She is well-developed. She is not ill-appearing or toxic-appearing.  HENT:     Head: Normocephalic.     Nose: Nose normal.     Mouth/Throat:  Mouth: Mucous membranes are dry.  Eyes:     Conjunctiva/sclera: Conjunctivae normal.  Neck:     Vascular: No JVD.  Cardiovascular:     Rate and Rhythm:  Regular rhythm. Tachycardia present.     Pulses: Normal pulses.     Heart sounds: Normal heart sounds.  Pulmonary:     Effort: Pulmonary effort is normal.     Breath sounds: Normal breath sounds.  Abdominal:     General: There is no distension.     Palpations: Abdomen is soft. There is no mass.     Tenderness: There is no abdominal tenderness. There is no right CVA tenderness, left CVA tenderness, guarding or rebound.     Hernia: No hernia is present.  Musculoskeletal:     Cervical back: Normal range of motion.  Skin:    General: Skin is warm and dry.     Coloration: Skin is pale.  Neurological:     Mental Status: She is oriented to person, place, and time.        LABORATORY DATA: I have reviewed the data as listed    Latest Ref Rng & Units 08/31/2022   12:37 PM 08/24/2022    9:03 AM 07/27/2022    8:17 AM  CBC  WBC 4.0 - 10.5 K/uL 4.4  3.5  3.0   Hemoglobin 12.0 - 15.0 g/dL 9.9  8.9  9.0   Hematocrit 36.0 - 46.0 % 26.6  25.2  25.4   Platelets 150 - 400 K/uL 101  159  129         Latest Ref Rng & Units 08/31/2022   12:37 PM 08/24/2022    9:03 AM 07/27/2022    8:17 AM  CMP  Glucose 70 - 99 mg/dL 112  127  119   BUN 6 - 20 mg/dL 12  8  12   $ Creatinine 0.44 - 1.00 mg/dL 0.91  0.98  1.04   Sodium 135 - 145 mmol/L 136  142  142   Potassium 3.5 - 5.1 mmol/L 2.9  2.6  2.9   Chloride 98 - 111 mmol/L 97  105  105   CO2 22 - 32 mmol/L 27  26  30   $ Calcium 8.9 - 10.3 mg/dL 9.7  8.7  9.0   Total Protein 6.5 - 8.1 g/dL 7.5  6.0  5.9   Total Bilirubin 0.3 - 1.2 mg/dL 0.8  0.7  0.5   Alkaline Phos 38 - 126 U/L 85  49  63   AST 15 - 41 U/L 20  20  19   $ ALT 0 - 44 U/L 25  12  21        $ RADIOGRAPHIC STUDIES (from last 24 hours if applicable) I have personally reviewed the radiological images as listed and agreed with the findings in the report. No results found.      Visit Diagnosis: 1. Hypokalemia   2. Hypomagnesemia   3. Shortness of breath   4. Malignant neoplasm of  upper-outer quadrant of left breast in female, estrogen receptor positive ([place])   5. Chemotherapy induced diarrhea      No orders of the defined types were placed in this encounter.   All questions were answered. The patient knows to call the clinic with any problems, questions or concerns. No barriers to learning was detected.  I have spent a total of 30 minutes minutes of face-to-face and non-face-to-face time, preparing to see the patient, obtaining and/or reviewing separately obtained history, performing  a medically appropriate examination, counseling and educating the patient, ordering tests, documenting clinical information in the electronic health record, and care coordination (communications with other health care professionals or caregivers).    Thank you for allowing me to participate in the care of this patient.    Barrie Folk, PA-C Department of Hematology/Oncology [facility] at [facility] Phone: [phone]  [phone]    08/31/2022 4:10 PM

## 2022-08-31 NOTE — Progress Notes (Signed)
Pt called with c/o nausea and dehydration. Appt in [facility] was made for 12:30 pm for fluids and antiemetics. Pt verbalized understanding.

## 2022-09-01 ENCOUNTER — Encounter: Payer: Self-pay | Admitting: Hematology and Oncology

## 2022-09-04 ENCOUNTER — Encounter: Payer: Self-pay | Admitting: *Deleted

## 2022-09-04 ENCOUNTER — Encounter: Payer: Self-pay | Admitting: General Practice

## 2022-09-04 NOTE — Progress Notes (Signed)
[facility] Spiritual Care Note  Referred by nursing for additional layer of emotional support. Left voicemail from Patient and Family Support, encouraging return call.   [place], [place], [facility] Pager [phone] Voicemail [phone]

## 2022-09-05 ENCOUNTER — Encounter: Payer: Self-pay | Admitting: General Practice

## 2022-09-05 NOTE — Progress Notes (Signed)
[facility] Spiritual Care Note  Left follow-up voicemail with encouragement to return call. Plan to follow up in infusion if needed.   [place], [place], [facility] Pager [phone] Voicemail [phone]

## 2022-09-13 MED FILL — Fosaprepitant Dimeglumine For IV Infusion 150 MG (Base Eq): INTRAVENOUS | Qty: 5 | Status: AC

## 2022-09-13 MED FILL — Dexamethasone Sodium Phosphate Inj 100 MG/10ML: INTRAMUSCULAR | Qty: 1 | Status: AC

## 2022-09-14 ENCOUNTER — Encounter: Payer: Self-pay | Admitting: Adult Health

## 2022-09-14 ENCOUNTER — Inpatient Hospital Stay: Payer: 59

## 2022-09-14 ENCOUNTER — Other Ambulatory Visit: Payer: Self-pay

## 2022-09-14 ENCOUNTER — Inpatient Hospital Stay: Payer: 59 | Attending: Hematology and Oncology

## 2022-09-14 ENCOUNTER — Inpatient Hospital Stay (HOSPITAL_BASED_OUTPATIENT_CLINIC_OR_DEPARTMENT_OTHER): Payer: 59 | Admitting: Adult Health

## 2022-09-14 ENCOUNTER — Ambulatory Visit: Payer: 59 | Admitting: Adult Health

## 2022-09-14 ENCOUNTER — Encounter: Payer: Self-pay | Admitting: General Practice

## 2022-09-14 ENCOUNTER — Telehealth: Payer: Self-pay | Admitting: Adult Health

## 2022-09-14 VITALS — BP 142/74 | HR 76 | Temp 98.1°F | Resp 20 | Wt 164.4 lb

## 2022-09-14 DIAGNOSIS — Z800 Family history of malignant neoplasm of digestive organs: Secondary | ICD-10-CM | POA: Insufficient documentation

## 2022-09-14 DIAGNOSIS — Z803 Family history of malignant neoplasm of breast: Secondary | ICD-10-CM | POA: Insufficient documentation

## 2022-09-14 DIAGNOSIS — Z170 Estrogen receptor positive status [ER+]: Secondary | ICD-10-CM

## 2022-09-14 DIAGNOSIS — Z8041 Family history of malignant neoplasm of ovary: Secondary | ICD-10-CM | POA: Diagnosis not present

## 2022-09-14 DIAGNOSIS — E876 Hypokalemia: Secondary | ICD-10-CM

## 2022-09-14 DIAGNOSIS — Z5189 Encounter for other specified aftercare: Secondary | ICD-10-CM | POA: Insufficient documentation

## 2022-09-14 DIAGNOSIS — R197 Diarrhea, unspecified: Secondary | ICD-10-CM | POA: Diagnosis not present

## 2022-09-14 DIAGNOSIS — C50412 Malignant neoplasm of upper-outer quadrant of left female breast: Secondary | ICD-10-CM

## 2022-09-14 DIAGNOSIS — Z8049 Family history of malignant neoplasm of other genital organs: Secondary | ICD-10-CM | POA: Diagnosis not present

## 2022-09-14 DIAGNOSIS — Z5112 Encounter for antineoplastic immunotherapy: Secondary | ICD-10-CM | POA: Insufficient documentation

## 2022-09-14 DIAGNOSIS — Z5111 Encounter for antineoplastic chemotherapy: Secondary | ICD-10-CM | POA: Insufficient documentation

## 2022-09-14 LAB — CBC WITH DIFFERENTIAL (CANCER CENTER ONLY)
Abs Immature Granulocytes: 0.04 10*3/uL (ref 0.00–0.07)
Basophils Absolute: 0 10*3/uL (ref 0.0–0.1)
Basophils Relative: 0 %
Eosinophils Absolute: 0 10*3/uL (ref 0.0–0.5)
Eosinophils Relative: 0 %
HCT: 24.1 % — ABNORMAL LOW (ref 36.0–46.0)
Hemoglobin: 8.6 g/dL — ABNORMAL LOW (ref 12.0–15.0)
Immature Granulocytes: 1 %
Lymphocytes Relative: 18 %
Lymphs Abs: 0.9 10*3/uL (ref 0.7–4.0)
MCH: 35.4 pg — ABNORMAL HIGH (ref 26.0–34.0)
MCHC: 35.7 g/dL (ref 30.0–36.0)
MCV: 99.2 fL (ref 80.0–100.0)
Monocytes Absolute: 0.4 10*3/uL (ref 0.1–1.0)
Monocytes Relative: 7 %
Neutro Abs: 3.5 10*3/uL (ref 1.7–7.7)
Neutrophils Relative %: 74 %
Platelet Count: 143 10*3/uL — ABNORMAL LOW (ref 150–400)
RBC: 2.43 MIL/uL — ABNORMAL LOW (ref 3.87–5.11)
RDW: 14.5 % (ref 11.5–15.5)
WBC Count: 4.8 10*3/uL (ref 4.0–10.5)
nRBC: 0 % (ref 0.0–0.2)

## 2022-09-14 LAB — CMP (CANCER CENTER ONLY)
ALT: 11 U/L (ref 0–44)
AST: 11 U/L — ABNORMAL LOW (ref 15–41)
Albumin: 3.8 g/dL (ref 3.5–5.0)
Alkaline Phosphatase: 55 U/L (ref 38–126)
Anion gap: 7 (ref 5–15)
BUN: 13 mg/dL (ref 6–20)
CO2: 30 mmol/L (ref 22–32)
Calcium: 9.2 mg/dL (ref 8.9–10.3)
Chloride: 105 mmol/L (ref 98–111)
Creatinine: 0.96 mg/dL (ref 0.44–1.00)
GFR, Estimated: >60 mL/min (ref >60)
Glucose, Bld: 99 mg/dL (ref 70–99)
Potassium: 2.9 mmol/L — ABNORMAL LOW (ref 3.5–5.1)
Sodium: 142 mmol/L (ref 135–145)
Total Bilirubin: 0.4 mg/dL (ref 0.3–1.2)
Total Protein: 6.5 g/dL (ref 6.5–8.1)

## 2022-09-14 MED ORDER — SODIUM CHLORIDE 0.9 % IV SOLN
150.0000 mg | Freq: Once | INTRAVENOUS | Status: AC
  Administered 2022-09-14: 150 mg via INTRAVENOUS
  Filled 2022-09-14: qty 150

## 2022-09-14 MED ORDER — SODIUM CHLORIDE 0.9 % IV SOLN
565.5000 mg | Freq: Once | INTRAVENOUS | Status: AC
  Administered 2022-09-14: 550 mg via INTRAVENOUS
  Filled 2022-09-14: qty 55

## 2022-09-14 MED ORDER — SODIUM CHLORIDE 0.9 % IV SOLN
10.0000 mg | Freq: Once | INTRAVENOUS | Status: AC
  Administered 2022-09-14: 10 mg via INTRAVENOUS
  Filled 2022-09-14: qty 10

## 2022-09-14 MED ORDER — HEPARIN SOD (PORK) LOCK FLUSH 100 UNIT/ML IV SOLN
500.0000 [IU] | Freq: Once | INTRAVENOUS | Status: AC | PRN
  Administered 2022-09-14: 500 [IU]

## 2022-09-14 MED ORDER — SODIUM CHLORIDE 0.9% FLUSH
10.0000 mL | Freq: Once | INTRAVENOUS | Status: AC
  Administered 2022-09-14: 10 mL

## 2022-09-14 MED ORDER — ACETAMINOPHEN 325 MG PO TABS
650.0000 mg | ORAL_TABLET | Freq: Once | ORAL | Status: AC
  Administered 2022-09-14: 650 mg via ORAL
  Filled 2022-09-14: qty 2

## 2022-09-14 MED ORDER — PALONOSETRON HCL INJECTION 0.25 MG/5ML
0.2500 mg | Freq: Once | INTRAVENOUS | Status: AC
  Administered 2022-09-14: 0.25 mg via INTRAVENOUS
  Filled 2022-09-14: qty 5

## 2022-09-14 MED ORDER — SODIUM CHLORIDE 0.9 % IV SOLN
50.0000 mg/m2 | Freq: Once | INTRAVENOUS | Status: AC
  Administered 2022-09-14: 102 mg via INTRAVENOUS
  Filled 2022-09-14: qty 10.2

## 2022-09-14 MED ORDER — SODIUM CHLORIDE 0.9 % IV SOLN: Freq: Once | INTRAVENOUS | Status: AC

## 2022-09-14 MED ORDER — SODIUM CHLORIDE 0.9% FLUSH
10.0000 mL | INTRAVENOUS | Status: DC | PRN
  Administered 2022-09-14: 10 mL

## 2022-09-14 MED ORDER — POTASSIUM CHLORIDE 10 MEQ/100ML IV SOLN
10.0000 meq | INTRAVENOUS | Status: AC
  Administered 2022-09-14 (×2): 10 meq via INTRAVENOUS
  Filled 2022-09-14 (×2): qty 100

## 2022-09-14 MED ORDER — SODIUM CHLORIDE 0.9 % IV SOLN
420.0000 mg | Freq: Once | INTRAVENOUS | Status: AC
  Administered 2022-09-14: 420 mg via INTRAVENOUS
  Filled 2022-09-14: qty 14

## 2022-09-14 MED ORDER — TRASTUZUMAB-ANNS CHEMO 150 MG IV SOLR
6.0000 mg/kg | Freq: Once | INTRAVENOUS | Status: AC
  Administered 2022-09-14: 420 mg via INTRAVENOUS
  Filled 2022-09-14: qty 20

## 2022-09-14 MED ORDER — DIPHENHYDRAMINE HCL 25 MG PO CAPS
50.0000 mg | ORAL_CAPSULE | Freq: Once | ORAL | Status: AC
  Administered 2022-09-14: 50 mg via ORAL
  Filled 2022-09-14: qty 2

## 2022-09-14 NOTE — Progress Notes (Signed)
[facility] Spiritual Care Note  Followed up in infusion to meet [place] in person. She used the opportunity to share how she has been coping with her diagnosis, including the sense that she has only partially processed her experience and its implications so far. Writing is one of her key tools in this work. Lashonna identified several sources of meaning in her life, especially her grandchildren, with whom she has been very involved, especially prior to starting treatment.   Brought a handmade blessing blanket as a tangible sign of comfort and encouragement, as well as a packet about [place] and [employer] support programming. Tristine is particularly interested in the free therapeutic massages. She plans to contact chaplain as needed/desired for follow-up support.   [place], [place], [facility] Pager [phone] Voicemail [phone]

## 2022-09-14 NOTE — Progress Notes (Signed)
[facility] Follow up:    [name], [name], DO [address] [place] [address] [place] [address]   DIAGNOSIS:  Cancer Staging  Malignant neoplasm of upper-outer quadrant of left breast in female, estrogen receptor positive ([place]) Staging form: Breast, AJCC 8th Edition - Clinical stage from 05/08/2022: Stage IB (cT2, cN0, cM0, G2, ER+, PR+, HER2+) - Signed by Hayden Pedro, PA-C on 05/08/2022 Stage prefix: Initial diagnosis Method of lymph node assessment: Clinical Histologic grading system: 3 grade system   SUMMARY OF ONCOLOGIC HISTORY: Oncology History  Malignant neoplasm of upper-outer quadrant of left breast in female, estrogen receptor positive ([place])  04/25/2022 Imaging   Patient had diagnostic bilateral mammogram given palpable mass in the left breast.  This showed a 2.6 x 3.5 x 3 cm irregular mass as well as possible satellite mass which is indeterminate.  An ultrasound was recommended.  Ultrasound of the left breast mass showed 3.9 x 2.4 x 2.3 cm irregular mass with an irregular and spiculated margin at 3:00 middle depth 5 cm from the nipple, irregular mass is hypoechoic.  In the left breast at 1 o'clock position, 3 cm from the nipple there is a round mass measuring 0.3 x 0.2 x 0.3 cm.  No significant abnormalities were seen sonographically in the left axilla.  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  Left breast needle core biopsy at 1:00 3 cm from the nipple showed fibrocystic changes with usual ductal hyperplasia negative for malignancy.  Prognostic showed ER 95% PR 40%, HER2 3+ KI of 50%   05/08/2022 Initial Diagnosis   Malignant neoplasm of upper-outer quadrant of left breast in female, estrogen receptor positive  ([place])   05/08/2022 Cancer Staging   Staging form: Breast, AJCC 8th Edition - Clinical stage from 05/08/2022: Stage IB (cT2, cN0, cM0, G2, ER+, PR+, HER2+) - Signed by Hayden Pedro, PA-C on 05/08/2022 Stage prefix: Initial diagnosis Method of lymph node assessment: Clinical Histologic grading system: 3 grade system   05/18/2022 -  Chemotherapy   Patient is on Treatment Plan : BREAST  Docetaxel + Carboplatin + Trastuzumab + Pertuzumab  (TCHP) q21d      05/23/2022 Genetic Testing   Negative genetics for [facility] CustomNext-Cancer +RNAinsight Panel.  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 Report date is 05/23/2022.   The CustomNext-Cancer+RNAinsight panel offered by [facility] includes sequencing and rearrangement analysis for the following 47 genes:  APC, ATM, AXIN2, BARD1, BMPR1A, BRCA1, BRCA2, BRIP1, CDH1, CDK4, CDKN2A, CHEK2, DICER1, EPCAM, GREM1, HOXB13, MEN1, MLH1, MSH2, MSH3, MSH6, MUTYH, NBN, NF1, NF2, NTHL1, PALB2, PMS2, POLD1, POLE, PTEN, RAD51C, RAD51D, RECQL, RET, SDHA, SDHAF2, SDHB, SDHC, SDHD, SMAD4, SMARCA4, STK11, TP53, TSC1, TSC2, and VHL.  RNA data is routinely analyzed for use in variant interpretation for all genes.   10/05/2022 -  Chemotherapy   Patient is on Treatment Plan : BREAST Trastuzumab + Pertuzumab q21d x 11 cycles       CURRENT THERAPY: Neoadjuvant chemotherapy TCHP  INTERVAL HISTORY: [name] [name] 47 y.o. female returns for follow-up prior to receiving her 6 cycle of TCHP.  Her most recent echocardiogram occurred on May 17, 2022 demonstrating a left ventricular ejection fraction of 60 to 65%.  She has a breast MRI scheduled  tomorrow and f/u with Dr. Ninfa Linden is scheduled on 09/22/2022.  [name] [name] was recently in the ER she had hypokalemia, dehydration, and tachycardia.  She had been unable to tolerate oral medications.  She endorses diarrhea once a day.  She denies any peripheral neuropathy and overall tells me that she is feeling well and without any  questions or concerns today.   Patient Active Problem List   Diagnosis Date Noted   Genetic testing 05/30/2022   Family history of breast cancer 05/10/2022   Family history of ovarian cancer 05/10/2022   Malignant neoplasm of upper-outer quadrant of left breast in female, estrogen receptor positive ([place]) 05/08/2022   Food impaction of esophagus    Schatzki's ring    Hx of migraine headaches 11/17/2013   Ureteral calculus 11/17/2013   GERD (gastroesophageal reflux disease) 11/17/2013   Esophageal obstruction due to food impaction 11/17/2013    is allergic to amoxicillin, coconut (cocos nucifera), penicillins, duloxetine, hydrocodone, hydrocodone-acetaminophen, and moxifloxacin.  MEDICAL HISTORY: Past Medical History:  Diagnosis Date   Anemia    history of   Breast cancer, left ([place])    Dyspnea    even with speaking   Family history of adverse reaction to anesthesia    mother slow to awaken   Family history of breast cancer 05/10/2022   Family history of ovarian cancer 05/10/2022   Fibromyalgia    GERD (gastroesophageal reflux disease)    History of kidney stones    last stone 6 months ago    Migraine    Seasonal allergies    Tinnitus    Vertigo     SURGICAL HISTORY: Past Surgical History:  Procedure Laterality Date   ADENOIDECTOMY  1991   CYSTOSCOPY WITH RETROGRADE PYELOGRAM, URETEROSCOPY AND STENT PLACEMENT Left 02/22/2021   Procedure: [place], URETEROSCOPY AND STENT PLACEMENT;  Surgeon: Remi Haggard, MD;  Location: [facility];  Service: Urology;  Laterality: Left;   CYSTOSCOPY/URETEROSCOPY/HOLMIUM LASER/STENT PLACEMENT Left 03/10/2021   Procedure: CYSTOSCOPY RETROGRADE, LEFT URETEROSCOPY/ STONE BASKETRY [place] LASER/STENT EXCHANGE;  Surgeon: Ardis Hughs, MD;  Location[facility];  Service: Urology;  Laterality: Left;   ESOPHAGOGASTRODUODENOSCOPY N/A 11/17/2013   Procedure: ESOPHAGOGASTRODUODENOSCOPY (EGD);   Surgeon: Milus Banister, MD;  Location: [facility];  Service: Endoscopy;  Laterality: N/A;   ESOPHAGOGASTRODUODENOSCOPY N/A 10/15/2015   Procedure: ESOPHAGOGASTRODUODENOSCOPY (EGD);  Surgeon: Gatha Mayer, MD;  Location[facility];  Service: Endoscopy;  Laterality: N/A;   HOLMIUM LASER APPLICATION  [id number]   Procedure: HOLMIUM LASER APPLICATION;  Surgeon: Ardis Hughs, MD;  Location: [facility];  Service: Urology;;   NASAL SEPTOPLASTY W/ TURBINOPLASTY Bilateral 03/18/2018   Procedure: BILATERAL NASAL SEPTOPLASTY WITH TURBINATE REDUCTION;  Surgeon: Jodi Marble, MD;  Location: [facility];  Service: ENT;  Laterality: Bilateral;   PORTACATH PLACEMENT Right 05/17/2022   Procedure: PORT-A-CATH INSERTION WITH ULTRASOUND GUIDANCE;  Surgeon: [name], MD;  Location: [facility];  Service: General;  Laterality: Right;   TONSILLECTOMY  1991   TUBAL LIGATION  2005    SOCIAL HISTORY: Social History   Socioeconomic History   Marital status: Single    Spouse name: Not on file   Number of children: Not on file   Years of education: Not on file   Highest education level: Not on file  Occupational History   Not on file  Tobacco Use   Smoking status: Never   Smokeless tobacco: Never  Vaping Use   Vaping Use: Never  used  Substance and Sexual Activity   Alcohol use: No   Drug use: No   Sexual activity: Not on file  Other Topics Concern   Not on file  Social History Narrative   ** Merged History Encounter **       Social Determinants of Health   Financial Resource Strain: Low Risk  (05/10/2022)   Overall Financial Resource Strain (CARDIA)    Difficulty of Paying Living Expenses: Not very hard  Food Insecurity: No Food Insecurity (05/10/2022)   Hunger Vital Sign    Worried About Running Out of Food in the Last Year: Never true    Ran Out of Food in the Last Year: Never true  Transportation Needs: No Transportation Needs (05/10/2022)    PRAPARE - Hydrologist (Medical): No    Lack of Transportation (Non-Medical): No  Physical Activity: Not on file  Stress: Not on file  Social Connections: Not on file  Intimate Partner Violence: Not on file    FAMILY HISTORY: Family History  Problem Relation Age of Onset   Asthma Mother    Diabetes Mother    Colon polyps Mother    Ovarian cancer Maternal Grandmother 20   Diabetes Maternal Grandmother    Esophageal cancer Maternal Grandfather        d. 62   Breast cancer Other        MGM's sisters x2   Cervical cancer Cousin        mat cousin; dx <40    Review of Systems  Constitutional:  Negative for appetite change, chills, fatigue, fever and unexpected weight change.  HENT:   Negative for hearing loss, lump/mass and trouble swallowing.   Eyes:  Negative for eye problems and icterus.  Respiratory:  Negative for chest tightness, cough and shortness of breath.   Cardiovascular:  Negative for chest pain, leg swelling and palpitations.  Gastrointestinal:  Positive for diarrhea. Negative for abdominal distention, abdominal pain, constipation, nausea and vomiting.  Endocrine: Negative for hot flashes.  Genitourinary:  Negative for difficulty urinating.   Musculoskeletal:  Negative for arthralgias.  Skin:  Negative for itching and rash.  Neurological:  Negative for dizziness, extremity weakness, headaches and numbness.  Hematological:  Negative for adenopathy. Does not bruise/bleed easily.  Psychiatric/Behavioral:  Negative for depression. The patient is not nervous/anxious.       PHYSICAL EXAMINATION  ECOG PERFORMANCE STATUS: 1 - Symptomatic but completely ambulatory  Vitals:   09/14/22 0912  BP: (!) 142/74  Pulse: 76  Resp: 20  Temp: 98.1 F (36.7 C)  SpO2: 99%    Physical Exam Constitutional:      General: She is not in acute distress.    Appearance: Normal appearance. She is not toxic-appearing.  HENT:     Head: Normocephalic and  atraumatic.  Eyes:     General: No scleral icterus. Cardiovascular:     Rate and Rhythm: Normal rate and regular rhythm.     Pulses: Normal pulses.     Heart sounds: Normal heart sounds.  Pulmonary:     Effort: Pulmonary effort is normal.     Breath sounds: Normal breath sounds.  Chest:     Comments: Unable to palpate any nodularity in the left upper outer quadrant of the breast. Abdominal:     General: Abdomen is flat. Bowel sounds are normal. There is no distension.     Palpations: Abdomen is soft.     Tenderness: There is no abdominal tenderness.  Musculoskeletal:        General: No swelling.     Cervical back: Neck supple.  Lymphadenopathy:     Cervical: No cervical adenopathy.  Skin:    General: Skin is warm and dry.     Findings: No rash.  Neurological:     General: No focal deficit present.     Mental Status: She is alert.  Psychiatric:        Mood and Affect: Mood normal.        Behavior: Behavior normal.     LABORATORY DATA:  CBC    Component Value Date/Time   WBC 4.8 09/14/2022 0849   WBC 8.2 02/21/2021 1833   RBC 2.43 (L) 09/14/2022 0849   HGB 8.6 (L) 09/14/2022 0849   HCT 24.1 (L) 09/14/2022 0849   PLT 143 (L) 09/14/2022 0849   MCV 99.2 09/14/2022 0849   MCH 35.4 (H) 09/14/2022 0849   MCHC 35.7 09/14/2022 0849   RDW 14.5 09/14/2022 0849   LYMPHSABS 0.9 09/14/2022 0849   MONOABS 0.4 09/14/2022 0849   EOSABS 0.0 09/14/2022 0849   BASOSABS 0.0 09/14/2022 0849    CMP     Component Value Date/Time   NA 142 09/14/2022 0849   K 2.9 (L) 09/14/2022 0849   CL 105 09/14/2022 0849   CO2 30 09/14/2022 0849   GLUCOSE 99 09/14/2022 0849   BUN 13 09/14/2022 0849   CREATININE 0.96 09/14/2022 0849   CALCIUM 9.2 09/14/2022 0849   PROT 6.5 09/14/2022 0849   ALBUMIN 3.8 09/14/2022 0849   AST 11 (L) 09/14/2022 0849   ALT 11 09/14/2022 0849   ALKPHOS 55 09/14/2022 0849   BILITOT 0.4 09/14/2022 0849   GFRNONAA >60 09/14/2022 0849   GFRAA >90 11/04/2014 1100       ASSESSMENT and THERAPY PLAN:   Malignant neoplasm of upper-outer quadrant of left breast in female, estrogen receptor positive ([place]) Wyvonna is a 47 year old woman with stage Ib triple positive breast cancer diagnosed in October 2023.  She is here today for follow-up and evaluation prior to receiving cycle 6 of her neoadjuvant chemotherapy with Taxotere carboplatin Herceptin and Perjeta.  Her repeat echocardiogram is due and I placed orders for this to be scheduled.    She has tolerated the treatment relatively well.  She has had issues with nausea, decreased appetite, and dehydration.  Her labs today are stable and she will proceed with chemotherapy.  On Saturday when she returns for her growth factor injection we will give her IV fluids.    Her potassium is 2.9 today.  Because of this she will receive 20 mill equivalents of KCl through the IV.  She was also instructed to take the oral potassium prescribed 20 mill equivalents twice a day.  We will recheck her labs next week and she will also get IV fluids.  She will meet with Dr. Ninfa Linden and undergo her MRI as scheduled.  She knows to call for any questions or concerns prior to her next appointment with [place].    All questions were answered. The patient knows to call the clinic with any problems, questions or concerns. We can certainly see the patient much sooner if necessary.  Total encounter time:30 minutes*in face-to-face visit time, chart review, lab review, care coordination, order entry, and documentation of the encounter time.  Wilber Bihari, NP 09/14/22 10:24 AM Medical Oncology and Hematology [facility] [place], [place] [address] Tel. [phone]    Fax. [phone]  *  Total Encounter Time as defined by the Centers for Medicare and Medicaid Services includes, in addition to the face-to-face time of a patient visit (documented in the note above) non-face-to-face time: obtaining and reviewing  outside history, ordering and reviewing medications, tests or procedures, care coordination (communications with other health care professionals or caregivers) and documentation in the medical record.

## 2022-09-14 NOTE — Assessment & Plan Note (Signed)
[name] [name] is a 47 year old woman with stage Ib triple positive breast cancer diagnosed in October 2023.  She is here today for follow-up and evaluation prior to receiving cycle 6 of her neoadjuvant chemotherapy with Taxotere carboplatin Herceptin and Perjeta.  Her repeat echocardiogram is due and I placed orders for this to be scheduled.    She has tolerated the treatment relatively well.  She has had issues with nausea, decreased appetite, and dehydration.  Her labs today are stable and she will proceed with chemotherapy.  On Saturday when she returns for her growth factor injection we will give her IV fluids.    Her potassium is 2.9 today.  Because of this she will receive 20 mill equivalents of KCl through the IV.  She was also instructed to take the oral potassium prescribed 20 mill equivalents twice a day.  We will recheck her labs next week and she will also get IV fluids.  She will meet with Dr. Ninfa Linden and undergo her MRI as scheduled.  She knows to call for any questions or concerns prior to her next appointment with [place].

## 2022-09-14 NOTE — Progress Notes (Signed)
Per Dr Lindi Adie, ok to proceed with echo from 05/17/22.

## 2022-09-14 NOTE — Progress Notes (Signed)
Per Wilber Bihari, NP, order placed for 1L NS with 20 meq potassium chloride ordered and placed under signed and held for 03/09.

## 2022-09-14 NOTE — Telephone Encounter (Signed)
Scheduled appointments per 3/7 los. Left voicemail.

## 2022-09-15 ENCOUNTER — Telehealth: Payer: Self-pay | Admitting: Adult Health

## 2022-09-15 ENCOUNTER — Encounter: Payer: Self-pay | Admitting: Hematology and Oncology

## 2022-09-15 ENCOUNTER — Ambulatory Visit
Admission: RE | Admit: 2022-09-15 | Discharge: 2022-09-15 | Disposition: A | Discharge status: Home / Self Care | Payer: 59 | Source: Ambulatory Visit | Attending: Adult Health | Admitting: Adult Health

## 2022-09-15 DIAGNOSIS — Z170 Estrogen receptor positive status [ER+]: Secondary | ICD-10-CM

## 2022-09-15 MED ORDER — GADOPICLENOL 0.5 MMOL/ML IV SOLN
10.0000 mL | Freq: Once | INTRAVENOUS | Status: AC | PRN
  Administered 2022-09-15: 8 mL via INTRAVENOUS

## 2022-09-15 NOTE — Telephone Encounter (Signed)
Scheduled appointment per los. Left voicemail. 

## 2022-09-16 ENCOUNTER — Inpatient Hospital Stay: Payer: 59

## 2022-09-16 VITALS — BP 119/78 | HR 72 | Temp 98.2°F | Resp 16

## 2022-09-16 DIAGNOSIS — Z5111 Encounter for antineoplastic chemotherapy: Secondary | ICD-10-CM | POA: Diagnosis not present

## 2022-09-16 DIAGNOSIS — Z170 Estrogen receptor positive status [ER+]: Secondary | ICD-10-CM

## 2022-09-16 DIAGNOSIS — E876 Hypokalemia: Secondary | ICD-10-CM

## 2022-09-16 MED ORDER — PEGFILGRASTIM-JMDB 6 MG/0.6ML ~~LOC~~ SOSY
6.0000 mg | PREFILLED_SYRINGE | Freq: Once | SUBCUTANEOUS | Status: AC
  Administered 2022-09-16: 6 mg via SUBCUTANEOUS

## 2022-09-16 MED ORDER — POTASSIUM CHLORIDE IN NACL 20-0.9 MEQ/L-% IV SOLN
Freq: Once | INTRAVENOUS | Status: DC
  Filled 2022-09-16: qty 1000

## 2022-09-16 NOTE — Progress Notes (Signed)
Pt refused KCL infusion. Advised of the importance of K+ and taking supplements with K+ rich foods. Pt verbalized understanding and stated, "I feel good. I will know when to call if I feel bad."

## 2022-09-18 ENCOUNTER — Encounter: Payer: Self-pay | Admitting: Hematology and Oncology

## 2022-09-19 ENCOUNTER — Encounter: Payer: Self-pay | Admitting: Adult Health

## 2022-09-19 ENCOUNTER — Encounter: Payer: Self-pay | Admitting: *Deleted

## 2022-09-20 ENCOUNTER — Inpatient Hospital Stay: Payer: 59

## 2022-09-20 ENCOUNTER — Inpatient Hospital Stay: Payer: 59 | Admitting: Adult Health

## 2022-09-22 ENCOUNTER — Other Ambulatory Visit: Payer: Self-pay | Admitting: Surgery

## 2022-09-22 DIAGNOSIS — C50912 Malignant neoplasm of unspecified site of left female breast: Secondary | ICD-10-CM | POA: Diagnosis not present

## 2022-09-22 DIAGNOSIS — Z853 Personal history of malignant neoplasm of breast: Secondary | ICD-10-CM

## 2022-09-25 ENCOUNTER — Inpatient Hospital Stay (HOSPITAL_BASED_OUTPATIENT_CLINIC_OR_DEPARTMENT_OTHER): Payer: 59 | Admitting: Adult Health

## 2022-09-25 ENCOUNTER — Inpatient Hospital Stay: Payer: 59

## 2022-09-25 ENCOUNTER — Encounter: Payer: Self-pay | Admitting: Adult Health

## 2022-09-25 VITALS — BP 123/73 | HR 88 | Temp 98.2°F | Resp 16 | Wt 164.1 lb

## 2022-09-25 VITALS — BP 108/58 | HR 87 | Temp 97.7°F | Resp 14 | Ht 68.0 in | Wt 160.0 lb

## 2022-09-25 DIAGNOSIS — C50412 Malignant neoplasm of upper-outer quadrant of left female breast: Secondary | ICD-10-CM

## 2022-09-25 DIAGNOSIS — Z5111 Encounter for antineoplastic chemotherapy: Secondary | ICD-10-CM | POA: Diagnosis not present

## 2022-09-25 DIAGNOSIS — Z170 Estrogen receptor positive status [ER+]: Secondary | ICD-10-CM

## 2022-09-25 LAB — CMP (CANCER CENTER ONLY)
ALT: 21 U/L (ref 0–44)
AST: 17 U/L (ref 15–41)
Albumin: 4 g/dL (ref 3.5–5.0)
Alkaline Phosphatase: 72 U/L (ref 38–126)
Anion gap: 9 (ref 5–15)
BUN: 11 mg/dL (ref 6–20)
CO2: 29 mmol/L (ref 22–32)
Calcium: 8.8 mg/dL — ABNORMAL LOW (ref 8.9–10.3)
Chloride: 103 mmol/L (ref 98–111)
Creatinine: 1.05 mg/dL — ABNORMAL HIGH (ref 0.44–1.00)
GFR, Estimated: >60 mL/min (ref >60)
Glucose, Bld: 102 mg/dL — ABNORMAL HIGH (ref 70–99)
Potassium: 3 mmol/L — ABNORMAL LOW (ref 3.5–5.1)
Sodium: 141 mmol/L (ref 135–145)
Total Bilirubin: 0.4 mg/dL (ref 0.3–1.2)
Total Protein: 6.7 g/dL (ref 6.5–8.1)

## 2022-09-25 LAB — CBC WITH DIFFERENTIAL (CANCER CENTER ONLY)
Abs Immature Granulocytes: 0.14 10*3/uL — ABNORMAL HIGH (ref 0.00–0.07)
Basophils Absolute: 0 10*3/uL (ref 0.0–0.1)
Basophils Relative: 0 %
Eosinophils Absolute: 0 10*3/uL (ref 0.0–0.5)
Eosinophils Relative: 0 %
HCT: 25.7 % — ABNORMAL LOW (ref 36.0–46.0)
Hemoglobin: 9 g/dL — ABNORMAL LOW (ref 12.0–15.0)
Immature Granulocytes: 2 %
Lymphocytes Relative: 15 %
Lymphs Abs: 1 10*3/uL (ref 0.7–4.0)
MCH: 35 pg — ABNORMAL HIGH (ref 26.0–34.0)
MCHC: 35 g/dL (ref 30.0–36.0)
MCV: 100 fL (ref 80.0–100.0)
Monocytes Absolute: 0.3 10*3/uL (ref 0.1–1.0)
Monocytes Relative: 5 %
Neutro Abs: 5.1 10*3/uL (ref 1.7–7.7)
Neutrophils Relative %: 78 %
Platelet Count: 119 10*3/uL — ABNORMAL LOW (ref 150–400)
RBC: 2.57 MIL/uL — ABNORMAL LOW (ref 3.87–5.11)
RDW: 13.7 % (ref 11.5–15.5)
WBC Count: 6.6 10*3/uL (ref 4.0–10.5)
nRBC: 0.3 % — ABNORMAL HIGH (ref 0.0–0.2)

## 2022-09-25 MED ORDER — SODIUM CHLORIDE 0.9% FLUSH
10.0000 mL | Freq: Once | INTRAVENOUS | Status: AC
  Administered 2022-09-25: 10 mL

## 2022-09-25 MED ORDER — HEPARIN SOD (PORK) LOCK FLUSH 100 UNIT/ML IV SOLN
500.0000 [IU] | Freq: Once | INTRAVENOUS | Status: AC
  Administered 2022-09-25: 500 [IU]

## 2022-09-25 MED ORDER — POTASSIUM CHLORIDE IN NACL 20-0.9 MEQ/L-% IV SOLN
INTRAVENOUS | Status: AC
  Filled 2022-09-25: qty 1000

## 2022-09-25 NOTE — Progress Notes (Signed)
[facility] Follow up:    [name], [name], DO [address] [place] [address]   DIAGNOSIS:  Cancer Staging  Malignant neoplasm of upper-outer quadrant of left breast in female, estrogen receptor positive ([place]) Staging form: Breast, AJCC 8th Edition - Clinical stage from 05/08/2022: Stage IB (cT2, cN0, cM0, G2, ER+, PR+, HER2+) - Signed by Hayden Pedro, PA-C on 05/08/2022 Stage prefix: Initial diagnosis Method of lymph node assessment: Clinical Histologic grading system: 3 grade system   SUMMARY OF ONCOLOGIC HISTORY: Oncology History  Malignant neoplasm of upper-outer quadrant of left breast in female, estrogen receptor positive ([place])  04/25/2022 Imaging   Patient had diagnostic bilateral mammogram given palpable mass in the left breast.  This showed a 2.6 x 3.5 x 3 cm irregular mass as well as possible satellite mass which is indeterminate.  An ultrasound was recommended.  Ultrasound of the left breast mass showed 3.9 x 2.4 x 2.3 cm irregular mass with an irregular and spiculated margin at 3:00 middle depth 5 cm from the nipple, irregular mass is hypoechoic.  In the left breast at 1 o'clock position, 3 cm from the nipple there is a round mass measuring 0.3 x 0.2 x 0.3 cm.  No significant abnormalities were seen sonographically in the left axilla.  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  Left breast needle core biopsy at 1:00 3 cm from the nipple showed fibrocystic changes with usual ductal hyperplasia negative for malignancy.  Prognostic showed ER 95% PR 40%, HER2 3+ KI of 50%   05/08/2022 Initial Diagnosis   Malignant neoplasm of upper-outer quadrant of left breast in female, estrogen receptor positive  ([place])   05/08/2022 Cancer Staging   Staging form: Breast, AJCC 8th Edition - Clinical stage from 05/08/2022: Stage IB (cT2, cN0, cM0, G2, ER+, PR+, HER2+) - Signed by Hayden Pedro, PA-C on 05/08/2022 Stage prefix: Initial diagnosis Method of lymph node assessment: Clinical Histologic grading system: 3 grade system   05/18/2022 - 09/16/2022 Chemotherapy   Patient is on Treatment Plan : BREAST  Docetaxel + Carboplatin + Trastuzumab + Pertuzumab  (TCHP) q21d      05/23/2022 Genetic Testing   Negative genetics for [facility] CustomNext-Cancer +RNAinsight Panel.  VUS in ATM at [name] ([id number]). Report date is 05/23/2022.   The CustomNext-Cancer+RNAinsight panel offered by [facility] includes sequencing and rearrangement analysis for the following 47 genes:  APC, ATM, AXIN2, BARD1, BMPR1A, BRCA1, BRCA2, BRIP1, CDH1, CDK4, CDKN2A, CHEK2, DICER1, EPCAM, GREM1, HOXB13, MEN1, MLH1, MSH2, MSH3, MSH6, MUTYH, NBN, NF1, NF2, NTHL1, PALB2, PMS2, POLD1, POLE, PTEN, RAD51C, RAD51D, RECQL, RET, SDHA, SDHAF2, SDHB, SDHC, SDHD, SMAD4, SMARCA4, STK11, TP53, TSC1, TSC2, and VHL.  RNA data is routinely analyzed for use in variant interpretation for all genes.   10/06/2022 -  Chemotherapy   Patient is on Treatment Plan : BREAST Trastuzumab + Pertuzumab q21d x 11 cycles       CURRENT THERAPY:  INTERVAL HISTORY: [name] [name] 47 y.o. female returns for f/u and evaluation after receiving her sixth cycle of neoadjuvant taxotere, carbo, herceptin and perjeta.  She tolerated treatment well and tells me she had diarrhea twice a day.  She is taking the potassium 20 mill equivalents 3  times a day and tolerates it moderately well.  She denies any peripheral neuropathy.  Her post neoadjuvant chemotherapy MRI occurred on 09/15/2022 and demonstrated a complete resolution enhancement associated no malignancy in the lateral portion of the left breast, no new or suspicious findings in either breast, no adenopathy.     She met with Dr. Ninfa Linden on 09/22/2022 and is planning on lumpectomy with sentinel node biopsy which will be scheduled in the next few weeks.  Her next echo is scheduled on 09/29/2022.  Her most recent echo occurred on 05/17/2022 and demonstrated a well-preserved ejection fraction of 60 to 65%.   Patient Active Problem List   Diagnosis Date Noted   Genetic testing 05/30/2022   Family history of breast cancer 05/10/2022   Family history of ovarian cancer 05/10/2022   Malignant neoplasm of upper-outer quadrant of left breast in female, estrogen receptor positive ([place]) 05/08/2022   Food impaction of esophagus    Schatzki's ring    Hx of migraine headaches 11/17/2013   Ureteral calculus 11/17/2013   GERD (gastroesophageal reflux disease) 11/17/2013   Esophageal obstruction due to food impaction 11/17/2013    is allergic to amoxicillin, coconut (cocos nucifera), penicillins, duloxetine, hydrocodone, hydrocodone-acetaminophen, and moxifloxacin.  MEDICAL HISTORY: Past Medical History:  Diagnosis Date   Anemia    history of   Breast cancer, left ([place])    Dyspnea    even with speaking   Family history of adverse reaction to anesthesia    mother slow to awaken   Family history of breast cancer 05/10/2022   Family history of ovarian cancer 05/10/2022   Fibromyalgia    GERD (gastroesophageal reflux disease)    History of kidney stones    last stone 6 months ago    Migraine    Seasonal allergies    Tinnitus    Vertigo     SURGICAL HISTORY: Past Surgical History:  Procedure Laterality Date   ADENOIDECTOMY  1991   CYSTOSCOPY WITH RETROGRADE PYELOGRAM, URETEROSCOPY AND STENT PLACEMENT Left 02/22/2021   Procedure: [place], URETEROSCOPY AND STENT PLACEMENT;  Surgeon: Remi Haggard, MD;  Location: [facility];  Service: Urology;  Laterality: Left;   CYSTOSCOPY/URETEROSCOPY/HOLMIUM LASER/STENT PLACEMENT Left 03/10/2021   Procedure: CYSTOSCOPY RETROGRADE, LEFT  URETEROSCOPY/ STONE BASKETRY [place] LASER/STENT EXCHANGE;  Surgeon: Ardis Hughs, MD;  Location[facility];  Service: Urology;  Laterality: Left;   ESOPHAGOGASTRODUODENOSCOPY N/A 11/17/2013   Procedure: ESOPHAGOGASTRODUODENOSCOPY (EGD);  Surgeon: Milus Banister, MD;  Location: [facility];  Service: Endoscopy;  Laterality: N/A;   ESOPHAGOGASTRODUODENOSCOPY N/A 10/15/2015   Procedure: ESOPHAGOGASTRODUODENOSCOPY (EGD);  Surgeon: Gatha Mayer, MD;  Location[facility];  Service: Endoscopy;  Laterality: N/A;   HOLMIUM LASER APPLICATION  [id number]   Procedure: HOLMIUM LASER APPLICATION;  Surgeon: Ardis Hughs, MD;  [facility];  Service: Urology;;   NASAL SEPTOPLASTY W/ TURBINOPLASTY Bilateral 03/18/2018   Procedure: BILATERAL NASAL SEPTOPLASTY WITH TURBINATE REDUCTION;  Surgeon: Jodi Marble, MD;  Location: [facility];  Service: ENT;  Laterality: Bilateral;   PORTACATH PLACEMENT Right 05/17/2022   Procedure: PORT-A-CATH INSERTION WITH ULTRASOUND GUIDANCE;  Surgeon: [name], MD;  Location: [facility];  Service: General;  Laterality: Right;   TONSILLECTOMY  1991   TUBAL LIGATION  2005    SOCIAL HISTORY: Social History   Socioeconomic History   Marital status: Single    Spouse name: Not on file   Number of children: Not on file  Years of education: Not on file   Highest education level: Not on file  Occupational History   Not on file  Tobacco Use   Smoking status: Never   Smokeless tobacco: Never  Vaping Use   Vaping Use: Never used  Substance and Sexual Activity   Alcohol use: No   Drug use: No   Sexual activity: Not on file  Other Topics Concern   Not on file  Social History Narrative   ** Merged History Encounter **       Social Determinants of Health   Financial Resource Strain: Low Risk  (05/10/2022)   Overall Financial Resource Strain (CARDIA)    Difficulty of Paying Living Expenses: Not  very hard  Food Insecurity: No Food Insecurity (05/10/2022)   Hunger Vital Sign    Worried About Running Out of Food in the Last Year: Never true    Ran Out of Food in the Last Year: Never true  Transportation Needs: No Transportation Needs (05/10/2022)   PRAPARE - Hydrologist (Medical): No    Lack of Transportation (Non-Medical): No  Physical Activity: Not on file  Stress: Not on file  Social Connections: Not on file  Intimate Partner Violence: Not on file    FAMILY HISTORY: Family History  Problem Relation Age of Onset   Asthma Mother    Diabetes Mother    Colon polyps Mother    Ovarian cancer Maternal Grandmother 70   Diabetes Maternal Grandmother    Esophageal cancer Maternal Grandfather        d. 33   Breast cancer Other        MGM's sisters x2   Cervical cancer Cousin        mat cousin; dx <40    Review of Systems  Constitutional:  Positive for fatigue. Negative for appetite change, chills, fever and unexpected weight change.  HENT:   Negative for hearing loss, lump/mass, mouth sores and trouble swallowing.   Eyes:  Negative for eye problems and icterus.  Respiratory:  Negative for chest tightness, cough and shortness of breath.   Cardiovascular:  Negative for chest pain, leg swelling and palpitations.  Gastrointestinal:  Positive for diarrhea. Negative for abdominal distention, abdominal pain, blood in stool, constipation, nausea and vomiting.  Endocrine: Negative for hot flashes.  Genitourinary:  Negative for difficulty urinating.   Musculoskeletal:  Negative for arthralgias.  Skin:  Negative for itching and rash.  Neurological:  Negative for dizziness, extremity weakness, headaches and numbness.  Hematological:  Negative for adenopathy. Does not bruise/bleed easily.  Psychiatric/Behavioral:  Negative for depression. The patient is not nervous/anxious.       PHYSICAL EXAMINATION  ECOG PERFORMANCE STATUS: 1 - Symptomatic but  completely ambulatory  Vitals:   09/25/22 0830  BP: (!) 108/58  Pulse: 87  Resp: 14  Temp: 97.7 F (36.5 C)  SpO2: 100%    Physical Exam Constitutional:      General: She is not in acute distress.    Appearance: Normal appearance. She is not toxic-appearing.  HENT:     Head: Normocephalic and atraumatic.     Mouth/Throat:     Mouth: Mucous membranes are moist.     Pharynx: Oropharynx is clear. No oropharyngeal exudate or posterior oropharyngeal erythema.  Eyes:     General: No scleral icterus. Cardiovascular:     Rate and Rhythm: Normal rate and regular rhythm.     Pulses: Normal pulses.     Heart sounds:  Normal heart sounds.  Pulmonary:     Effort: Pulmonary effort is normal.     Breath sounds: Normal breath sounds.  Abdominal:     General: Abdomen is flat. Bowel sounds are normal. There is no distension.     Palpations: Abdomen is soft.     Tenderness: There is no abdominal tenderness.  Musculoskeletal:        General: No swelling.     Cervical back: Neck supple.  Lymphadenopathy:     Cervical: No cervical adenopathy.  Skin:    General: Skin is warm and dry.     Findings: No rash.  Neurological:     General: No focal deficit present.     Mental Status: She is alert.  Psychiatric:        Mood and Affect: Mood normal.        Behavior: Behavior normal.     LABORATORY DATA:  CBC    Component Value Date/Time   WBC 6.6 09/25/2022 0742   WBC 8.2 02/21/2021 1833   RBC 2.57 (L) 09/25/2022 0742   HGB 9.0 (L) 09/25/2022 0742   HCT 25.7 (L) 09/25/2022 0742   PLT 119 (L) 09/25/2022 0742   MCV 100.0 09/25/2022 0742   MCH 35.0 (H) 09/25/2022 0742   MCHC 35.0 09/25/2022 0742   RDW 13.7 09/25/2022 0742   LYMPHSABS 1.0 09/25/2022 0742   MONOABS 0.3 09/25/2022 0742   EOSABS 0.0 09/25/2022 0742   BASOSABS 0.0 09/25/2022 0742    CMP     Component Value Date/Time   NA 141 09/25/2022 0742   K 3.0 (L) 09/25/2022 0742   CL 103 09/25/2022 0742   CO2 29 09/25/2022  0742   GLUCOSE 102 (H) 09/25/2022 0742   BUN 11 09/25/2022 0742   CREATININE 1.05 (H) 09/25/2022 0742   CALCIUM 8.8 (L) 09/25/2022 0742   PROT 6.7 09/25/2022 0742   ALBUMIN 4.0 09/25/2022 0742   AST 17 09/25/2022 0742   ALT 21 09/25/2022 0742   ALKPHOS 72 09/25/2022 0742   BILITOT 0.4 09/25/2022 0742   GFRNONAA >60 09/25/2022 0742   GFRAA >90 11/04/2014 1100       ASSESSMENT and THERAPY PLAN:   Malignant neoplasm of upper-outer quadrant of left breast in female, estrogen receptor positive ([place]) [name] [name] is a 47 year old woman with stage Ib triple positive breast cancer diagnosed in October 2023.  She is status post neoadjuvant chemotherapy and is here today to discuss her MRI results and for a toxicity check.  Treatment plan: 1.  Neoadjuvant chemotherapy with Taxotere carboplatin Herceptin and Perjeta 2.  Breast conserving surgery with sentinel lymph node biopsy 3.  Maintenance Herceptin Perjeta to complete a year of therapy 4.  Adjuvant radiation 5.  Antiestrogen therapy  Chemo toxicities Diarrhea: She has Lomotil and Imodium and this is improving every day. Hypokalemia: She will receive IV fluids today with 20 mill equivalents of KCl.  She also has potassium supplementation 3 times daily that she is taking.  I am hopeful with discontinuing the Taxotere and [place] that she will not have as much diarrhea with Herceptin and Perjeta alone. She will proceed with breast conserving surgery with Dr. Ninfa Linden as recommended.  I let her know that we will need her to keep her port so we can continue 1 year of Herceptin Perjeta therapy.  We will see [name] [name] back on March 29 for labs, follow-up, and her next treatment with Herceptin Perjeta alone.  She is scheduled for an echo on March  22.  All questions were answered. The patient knows to call the clinic with any problems, questions or concerns. We can certainly see the patient much sooner if necessary.  Total encounter time:30 minutes*in  face-to-face visit time, chart review, lab review, care coordination, order entry, and documentation of the encounter time.    Wilber Bihari, NP 09/25/22 8:58 AM Medical Oncology and Hematology [facility] [place], [place] [address] Tel. [phone]    Fax. [phone]  *Total Encounter Time as defined by the Centers for Medicare and Medicaid Services includes, in addition to the face-to-face time of a patient visit (documented in the note above) non-face-to-face time: obtaining and reviewing outside history, ordering and reviewing medications, tests or procedures, care coordination (communications with other health care professionals or caregivers) and documentation in the medical record.

## 2022-09-25 NOTE — Assessment & Plan Note (Signed)
[name] [name] is a 46 year old woman with stage Ib triple positive breast cancer diagnosed in October 2023.  She is status post neoadjuvant chemotherapy and is here today to discuss her MRI results and for a toxicity check.  Treatment plan: 1.  Neoadjuvant chemotherapy with Taxotere carboplatin Herceptin and Perjeta 2.  Breast conserving surgery with sentinel lymph node biopsy 3.  Maintenance Herceptin Perjeta to complete a year of therapy 4.  Adjuvant radiation 5.  Antiestrogen therapy  Chemo toxicities Diarrhea: She has Lomotil and Imodium and this is improving every day. Hypokalemia: She will receive IV fluids today with 20 mill equivalents of KCl.  She also has potassium supplementation 3 times daily that she is taking.  I am hopeful with discontinuing the Taxotere and [place] that she will not have as much diarrhea with Herceptin and Perjeta alone. She will proceed with breast conserving surgery with Dr. Ninfa Linden as recommended.  I let her know that we will need her to keep her port so we can continue 1 year of Herceptin Perjeta therapy.  We will see Shandreka back on March 29 for labs, follow-up, and her next treatment with Herceptin Perjeta alone.  She is scheduled for an echo on March 22.

## 2022-09-26 ENCOUNTER — Encounter: Payer: Self-pay | Admitting: *Deleted

## 2022-09-26 ENCOUNTER — Encounter: Payer: Self-pay | Admitting: Hematology and Oncology

## 2022-09-29 ENCOUNTER — Other Ambulatory Visit (HOSPITAL_COMMUNITY): Payer: Self-pay

## 2022-09-29 ENCOUNTER — Telehealth: Payer: Self-pay | Admitting: Hematology and Oncology

## 2022-09-29 ENCOUNTER — Ambulatory Visit (HOSPITAL_COMMUNITY)
Admission: RE | Admit: 2022-09-29 | Discharge: 2022-09-29 | Disposition: A | Discharge status: Home / Self Care | Payer: 59 | Source: Ambulatory Visit | Attending: Adult Health | Admitting: Adult Health

## 2022-09-29 DIAGNOSIS — C50412 Malignant neoplasm of upper-outer quadrant of left female breast: Secondary | ICD-10-CM

## 2022-09-29 DIAGNOSIS — Z5111 Encounter for antineoplastic chemotherapy: Secondary | ICD-10-CM | POA: Insufficient documentation

## 2022-09-29 DIAGNOSIS — Z170 Estrogen receptor positive status [ER+]: Secondary | ICD-10-CM

## 2022-09-29 DIAGNOSIS — Z0189 Encounter for other specified special examinations: Secondary | ICD-10-CM

## 2022-09-29 LAB — ECHOCARDIOGRAM COMPLETE
AR max vel: 2.55 cm2
AV Area VTI: 2.55 cm2
AV Area mean vel: 2.52 cm2
AV Mean grad: 5 mmHg
AV Peak grad: 8.2 mmHg
Ao pk vel: 1.43 m/s
Area-P 1/2: 3.42 cm2
Calc EF: 59.3 %
MV M vel: 4.54 m/s
MV Peak grad: 82.4 mmHg
Radius: 0.4 cm
S' Lateral: 3.3 cm
Single Plane A2C EF: 65.3 %
Single Plane A4C EF: 57.3 %

## 2022-09-29 NOTE — Telephone Encounter (Signed)
Scheduled appointments per WQ. Patient is aware of the made appointments.  

## 2022-10-02 ENCOUNTER — Encounter: Payer: Self-pay | Admitting: *Deleted

## 2022-10-02 DIAGNOSIS — C50912 Malignant neoplasm of unspecified site of left female breast: Secondary | ICD-10-CM | POA: Diagnosis not present

## 2022-10-03 ENCOUNTER — Encounter: Payer: Self-pay | Admitting: Hematology and Oncology

## 2022-10-04 ENCOUNTER — Telehealth: Payer: Self-pay | Admitting: Hematology and Oncology

## 2022-10-04 ENCOUNTER — Encounter: Payer: Self-pay | Admitting: Hematology and Oncology

## 2022-10-04 NOTE — Telephone Encounter (Signed)
Patient aware of appointment change  

## 2022-10-05 ENCOUNTER — Inpatient Hospital Stay: Payer: 59 | Admitting: Hematology and Oncology

## 2022-10-05 ENCOUNTER — Inpatient Hospital Stay: Payer: 59

## 2022-10-12 ENCOUNTER — Encounter (HOSPITAL_BASED_OUTPATIENT_CLINIC_OR_DEPARTMENT_OTHER): Payer: Self-pay | Admitting: Surgery

## 2022-10-12 ENCOUNTER — Other Ambulatory Visit: Payer: Self-pay

## 2022-10-12 NOTE — Progress Notes (Signed)
   10/12/22 1355  PAT Phone Screen  Is the patient taking a GLP-1 receptor agonist? No  Do You Have Diabetes? No  Do You Have Hypertension? No  Have You Ever Been to the ER for Asthma? No  Have You Taken Oral Steroids in the Past 3 Months? No  Do you Take Phenteramine or any Other Diet Drugs? No  Recent  Lab Work, EKG, CXR? Yes  Where was this test performed? EKG 05/17/22 CBC CMET 09/25/22 K+ 3.0  H&H 9/25.7  PLT 119  Do you have a history of heart problems? No  Any Recent Hospitalizations? No  Height 5\' 8"  (1.727 m)  Weight 68.9 kg  Pat Appointment Scheduled (S)  No (Pt has lab appt at [facility] on 10/17/22 and will have labs drawn there.)  Reason for No Appointment Not Needed

## 2022-10-12 NOTE — Progress Notes (Signed)

## 2022-10-16 ENCOUNTER — Other Ambulatory Visit: Payer: Self-pay | Admitting: *Deleted

## 2022-10-16 DIAGNOSIS — C50412 Malignant neoplasm of upper-outer quadrant of left female breast: Secondary | ICD-10-CM

## 2022-10-17 ENCOUNTER — Inpatient Hospital Stay: Payer: 59

## 2022-10-17 ENCOUNTER — Inpatient Hospital Stay (HOSPITAL_BASED_OUTPATIENT_CLINIC_OR_DEPARTMENT_OTHER): Payer: 59 | Admitting: Physician Assistant

## 2022-10-17 ENCOUNTER — Other Ambulatory Visit: Payer: Self-pay | Admitting: Hematology and Oncology

## 2022-10-17 ENCOUNTER — Other Ambulatory Visit (HOSPITAL_COMMUNITY): Payer: Self-pay

## 2022-10-17 ENCOUNTER — Inpatient Hospital Stay: Payer: 59 | Attending: Hematology and Oncology

## 2022-10-17 VITALS — BP 130/79 | HR 85 | Temp 97.3°F | Resp 18 | Ht 68.0 in | Wt 164.2 lb

## 2022-10-17 VITALS — BP 128/68 | HR 77 | Resp 14

## 2022-10-17 DIAGNOSIS — Z170 Estrogen receptor positive status [ER+]: Secondary | ICD-10-CM | POA: Diagnosis not present

## 2022-10-17 DIAGNOSIS — Z5112 Encounter for antineoplastic immunotherapy: Secondary | ICD-10-CM | POA: Diagnosis not present

## 2022-10-17 DIAGNOSIS — Z5111 Encounter for antineoplastic chemotherapy: Secondary | ICD-10-CM | POA: Insufficient documentation

## 2022-10-17 DIAGNOSIS — E876 Hypokalemia: Secondary | ICD-10-CM | POA: Insufficient documentation

## 2022-10-17 DIAGNOSIS — R7989 Other specified abnormal findings of blood chemistry: Secondary | ICD-10-CM | POA: Diagnosis not present

## 2022-10-17 DIAGNOSIS — C50412 Malignant neoplasm of upper-outer quadrant of left female breast: Secondary | ICD-10-CM | POA: Insufficient documentation

## 2022-10-17 LAB — CMP (CANCER CENTER ONLY)
ALT: 15 U/L (ref 0–44)
AST: 15 U/L (ref 15–41)
Albumin: 3.9 g/dL (ref 3.5–5.0)
Alkaline Phosphatase: 66 U/L (ref 38–126)
Anion gap: 8 (ref 5–15)
BUN: 16 mg/dL (ref 6–20)
CO2: 28 mmol/L (ref 22–32)
Calcium: 9.3 mg/dL (ref 8.9–10.3)
Chloride: 106 mmol/L (ref 98–111)
Creatinine: 1.16 mg/dL — ABNORMAL HIGH (ref 0.44–1.00)
GFR, Estimated: 59 mL/min — ABNORMAL LOW (ref >60)
Glucose, Bld: 107 mg/dL — ABNORMAL HIGH (ref 70–99)
Potassium: 3.3 mmol/L — ABNORMAL LOW (ref 3.5–5.1)
Sodium: 142 mmol/L (ref 135–145)
Total Bilirubin: 0.5 mg/dL (ref 0.3–1.2)
Total Protein: 6.3 g/dL — ABNORMAL LOW (ref 6.5–8.1)

## 2022-10-17 LAB — CBC WITH DIFFERENTIAL (CANCER CENTER ONLY)
Abs Immature Granulocytes: 0.01 10*3/uL (ref 0.00–0.07)
Basophils Absolute: 0 10*3/uL (ref 0.0–0.1)
Basophils Relative: 1 %
Eosinophils Absolute: 0 10*3/uL (ref 0.0–0.5)
Eosinophils Relative: 1 %
HCT: 28.8 % — ABNORMAL LOW (ref 36.0–46.0)
Hemoglobin: 10 g/dL — ABNORMAL LOW (ref 12.0–15.0)
Immature Granulocytes: 0 %
Lymphocytes Relative: 28 %
Lymphs Abs: 0.8 10*3/uL (ref 0.7–4.0)
MCH: 34.8 pg — ABNORMAL HIGH (ref 26.0–34.0)
MCHC: 34.7 g/dL (ref 30.0–36.0)
MCV: 100.3 fL — ABNORMAL HIGH (ref 80.0–100.0)
Monocytes Absolute: 0.3 10*3/uL (ref 0.1–1.0)
Monocytes Relative: 9 %
Neutro Abs: 1.7 10*3/uL (ref 1.7–7.7)
Neutrophils Relative %: 61 %
Platelet Count: 150 10*3/uL (ref 150–400)
RBC: 2.87 MIL/uL — ABNORMAL LOW (ref 3.87–5.11)
RDW: 13.1 % (ref 11.5–15.5)
WBC Count: 2.8 10*3/uL — ABNORMAL LOW (ref 4.0–10.5)
nRBC: 0 % (ref 0.0–0.2)

## 2022-10-17 MED ORDER — SODIUM CHLORIDE 0.9% FLUSH
10.0000 mL | INTRAVENOUS | Status: DC | PRN
  Administered 2022-10-17: 10 mL

## 2022-10-17 MED ORDER — ACETAMINOPHEN 325 MG PO TABS
650.0000 mg | ORAL_TABLET | Freq: Once | ORAL | Status: AC
  Administered 2022-10-17: 650 mg via ORAL
  Filled 2022-10-17: qty 2

## 2022-10-17 MED ORDER — SODIUM CHLORIDE 0.9 % IV SOLN
420.0000 mg | Freq: Once | INTRAVENOUS | Status: AC
  Administered 2022-10-17: 420 mg via INTRAVENOUS
  Filled 2022-10-17: qty 14

## 2022-10-17 MED ORDER — SODIUM CHLORIDE 0.9 % IV SOLN: Freq: Once | INTRAVENOUS | Status: AC

## 2022-10-17 MED ORDER — TRASTUZUMAB-ANNS CHEMO 150 MG IV SOLR
6.0000 mg/kg | Freq: Once | INTRAVENOUS | Status: AC
  Administered 2022-10-17: 420 mg via INTRAVENOUS
  Filled 2022-10-17: qty 20

## 2022-10-17 MED ORDER — SODIUM CHLORIDE 0.9% FLUSH
10.0000 mL | Freq: Once | INTRAVENOUS | Status: AC
  Administered 2022-10-17: 10 mL

## 2022-10-17 MED ORDER — LIDOCAINE-PRILOCAINE 2.5-2.5 % EX CREA: 1.0000 | TOPICAL_CREAM | CUTANEOUS | 0 refills | Status: DC | PRN

## 2022-10-17 MED ORDER — DIPHENHYDRAMINE HCL 25 MG PO CAPS
50.0000 mg | ORAL_CAPSULE | Freq: Once | ORAL | Status: AC
  Administered 2022-10-17: 25 mg via ORAL
  Filled 2022-10-17: qty 2

## 2022-10-17 MED ORDER — HEPARIN SOD (PORK) LOCK FLUSH 100 UNIT/ML IV SOLN
500.0000 [IU] | Freq: Once | INTRAVENOUS | Status: AC | PRN
  Administered 2022-10-17: 500 [IU]

## 2022-10-17 MED ORDER — ACETAMINOPHEN 325 MG PO TABS
ORAL_TABLET | ORAL | Status: AC
  Filled 2022-10-17: qty 1

## 2022-10-17 NOTE — Progress Notes (Signed)
Plan signed.

## 2022-10-17 NOTE — H&P (Addendum)
  PROVIDER: Wayne Both, MD  MRN: [id number] DOB: [birth year]  Subjective  Chief Complaint: Post Operative Visit and New Consultation   History of Present Illness: [name] [name] is a 47 y.o. female who is seen t for a long-term follow-up regarding her left breast cancer. She has completed neoadjuvant chemotherapy and her follow-up MRI of the left breast showed complete resolution of her mass as well as no enlarged lymph nodes in the axilla. She has been doing very well and has no complaints today.    Review of Systems: A complete review of systems was obtained from the patient. I have reviewed this information and discussed as appropriate with the patient. See HPI as well for other ROS.  ROS  Medical History: Past Medical History: Diagnosis Date GERD (gastroesophageal reflux disease)  There is no problem list on file for this patient.  Past Surgical History: Procedure Laterality Date TONSILLECTOMY & ADENOIDECTOMY 1991 LAPAROSCOPIC TUBAL LIGATION 2005 nasal septoplasty 03/18/2018 cystoscopy with retrograde pyelogram/ uteroscopy with stent placement 02/12/2021 CYSTOSCOPY W/ URETEROSCOPY W/ LITHOTRIPSY 03/10/2021   Allergies Allergen Reactions Amoxicillin Shortness Of Breath Penicillins Shortness Of Breath Duloxetine Other (See Comments) Suicidal thoughts Hydrocodone Itching  Current Outpatient Medications on File Prior to Visit Medication Sig Dispense Refill diphenoxylate-atropine (LOMOTIL) 2.5-0.025 mg tablet Take by mouth potassium chloride 20 mEq/15 mL oral solution Take by mouth acetaminophen (TYLENOL) 500 MG tablet Take by mouth  No current facility-administered medications on file prior to visit.  Family History Problem Relation Age of Onset High blood pressure (Hypertension) Mother Hyperlipidemia (Elevated cholesterol) Mother Coronary Artery Disease (Blocked arteries around heart) Mother Diabetes Mother Deep vein thrombosis (DVT or abnormal  blood clot formation) Maternal Grandmother Ovarian cancer Maternal Grandmother   Social History  Tobacco Use Smoking Status Never Smokeless Tobacco Never   Social History  Socioeconomic History Marital status: Single Tobacco Use Smoking status: Never Smokeless tobacco: Never Substance and Sexual Activity Alcohol use: Not Currently Drug use: Never  Objective:  Vitals:  BP: 105/72 Pulse: (!) 127 Temp: 36.7 C (98 F) SpO2: 98% Weight: 71.7 kg (158 lb) Height: 170.2 cm (5\' 7" )  Body mass index is 24.75 kg/m.  Physical Exam  On exam, her left breast mass is no longer palpable and there is no axillary adenopathy  Labs, Imaging and Diagnostic Testing:  I reviewed her MRI  Assessment and Plan:  Diagnoses and all orders for this visit:  Invasive ductal carcinoma of breast, left (CMS-HCC)    She has had a complete response with neoadjuvant chemotherapy. We next discussed proceeding with a radioactive seed guided left breast lumpectomy and sentinel lymph node biopsy as she is still interested in breast conservation.  I explained the surgical procedure in detail. We discussed the risks which includes but is not limited to bleeding, infection, the need for further surgery if lymph nodes are positive or malignancy is still found in the breast with positive margins, cardiopulmonary issues with surgery, chronic lymphedema, seroma formation, injury to surrounding structures, stop her recovery, etc. She understands and wished to proceed with surgery which will be scheduled.

## 2022-10-17 NOTE — Patient Instructions (Signed)
[facility]  Discharge Instructions: Thank you for choosing [facility] to provide your oncology and hematology care.   If you have a lab appointment with the Cancer Center, please go directly to the Cancer Center and check in at the registration area.   Wear comfortable clothing and clothing appropriate for easy access to any Portacath or PICC line.   We strive to give you quality time with your provider. You may need to reschedule your appointment if you arrive late (15 or more minutes).  Arriving late affects you and other patients whose appointments are after yours.  Also, if you miss three or more appointments without notifying the office, you may be dismissed from the clinic at the provider's discretion.      For prescription refill requests, have your pharmacy contact our office and allow 72 hours for refills to be completed.    Today you received the following chemotherapy and/or immunotherapy agents: Kanjinti/Perjeta   To help prevent nausea and vomiting after your treatment, we encourage you to take your nausea medication as directed.  BELOW ARE SYMPTOMS THAT SHOULD BE REPORTED IMMEDIATELY: *FEVER GREATER THAN 100.4 F (38 C) OR HIGHER *CHILLS OR SWEATING *NAUSEA AND VOMITING THAT IS NOT CONTROLLED WITH YOUR NAUSEA MEDICATION *UNUSUAL SHORTNESS OF BREATH *UNUSUAL BRUISING OR BLEEDING *URINARY PROBLEMS (pain or burning when urinating, or frequent urination) *BOWEL PROBLEMS (unusual diarrhea, constipation, pain near the anus) TENDERNESS IN MOUTH AND THROAT WITH OR WITHOUT PRESENCE OF ULCERS (sore throat, sores in mouth, or a toothache) UNUSUAL RASH, SWELLING OR PAIN  UNUSUAL VAGINAL DISCHARGE OR ITCHING   Items with * indicate a potential emergency and should be followed up as soon as possible or go to the Emergency Department if any problems should occur.  Please show the CHEMOTHERAPY ALERT CARD or IMMUNOTHERAPY ALERT CARD at  check-in to the Emergency Department and triage nurse.  Should you have questions after your visit or need to cancel or reschedule your appointment, please contact [facility]  Dept: [phone]  and follow the prompts.  Office hours are 8:00 a.m. to 4:30 p.m. Monday - Friday. Please note that voicemails left after 4:00 p.m. may not be returned until the following business day.  We are closed weekends and major holidays. You have access to a nurse at all times for urgent questions. Please call the main number to the clinic Dept: [phone] and follow the prompts.   For any non-urgent questions, you may also contact your provider using MyChart. We now offer e-Visits for anyone 18 and older to request care online for non-urgent symptoms. For details visit [email or web address].   Also download the MyChart app! Go to the app store, search "MyChart", open the app, select [facility], and log in with your MyChart username and password.   

## 2022-10-17 NOTE — Progress Notes (Signed)
Pt refused to take full ordered dose of 50 mg benadryl PO. Pt agreed to take 25 mg benadryl PO. This RN made Dr. Al Pimple aware. Per Dr. Al Pimple OK for Pt to only receive 25 mg benadryl.

## 2022-10-17 NOTE — Progress Notes (Signed)
[facility] Cancer Follow up:    [name], [name], DO [address] [place] [address]   DIAGNOSIS:  Cancer Staging  Malignant neoplasm of upper-outer quadrant of left breast in female, estrogen receptor positive Staging form: Breast, AJCC 8th Edition - Clinical stage from 05/08/2022: Stage IB (cT2, cN0, cM0, G2, ER+, PR+, HER2+) - Signed by Ronny Bacon, PA-C on 05/08/2022 Stage prefix: Initial diagnosis Method of lymph node assessment: Clinical Histologic grading system: 3 grade system   SUMMARY OF ONCOLOGIC HISTORY: Oncology History  Malignant neoplasm of upper-outer quadrant of left breast in female, estrogen receptor positive  04/25/2022 Imaging   Patient had diagnostic bilateral mammogram given palpable mass in the left breast.  This showed a 2.6 x 3.5 x 3 cm irregular mass as well as possible satellite mass which is indeterminate.  An ultrasound was recommended.  Ultrasound of the left breast mass showed 3.9 x 2.4 x 2.3 cm irregular mass with an irregular and spiculated margin at 3:00 middle depth 5 cm from the nipple, irregular mass is hypoechoic.  In the left breast at 1 o'clock position, 3 cm from the nipple there is a round mass measuring 0.3 x 0.2 x 0.3 cm.  No significant abnormalities were seen sonographically in the left axilla.  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  Left breast needle core biopsy at 1:00 3 cm from the nipple showed fibrocystic changes with usual ductal hyperplasia negative for malignancy.  Prognostic showed ER 95% PR 40%, HER2 3+ KI of 50%   05/08/2022 Initial Diagnosis   Malignant neoplasm of upper-outer quadrant of left breast in female, estrogen receptor positive (HCC)    05/08/2022 Cancer Staging   Staging form: Breast, AJCC 8th Edition - Clinical stage from 05/08/2022: Stage IB (cT2, cN0, cM0, G2, ER+, PR+, HER2+) - Signed by Ronny Bacon, PA-C on 05/08/2022 Stage prefix: Initial diagnosis Method of lymph node assessment: Clinical Histologic grading system: 3 grade system   05/18/2022 - 09/16/2022 Chemotherapy   Patient is on Treatment Plan : BREAST  Docetaxel + Carboplatin + Trastuzumab + Pertuzumab  (TCHP) q21d      05/23/2022 Genetic Testing   Negative genetics for [facility] CustomNext-Cancer +RNAinsight Panel.  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 Report date is 05/23/2022.   The CustomNext-Cancer+RNAinsight panel offered by [employer] includes sequencing and rearrangement analysis for the following 47 genes:  APC, ATM, AXIN2, BARD1, BMPR1A, BRCA1, BRCA2, BRIP1, CDH1, CDK4, CDKN2A, CHEK2, DICER1, EPCAM, GREM1, HOXB13, MEN1, MLH1, MSH2, MSH3, MSH6, MUTYH, NBN, NF1, NF2, NTHL1, PALB2, PMS2, POLD1, POLE, PTEN, RAD51C, RAD51D, RECQL, RET, SDHA, SDHAF2, SDHB, SDHC, SDHD, SMAD4, SMARCA4, STK11, TP53, TSC1, TSC2, and VHL.  RNA data is routinely analyzed for use in variant interpretation for all genes.   10/17/2022 -  Chemotherapy   Patient is on Treatment Plan : BREAST Trastuzumab + Pertuzumab q21d x 11 cycles       CURRENT THERAPY: Maintenance Trastuzumab plus Pertuzumab q 21 days x 1 year.   INTERVAL HISTORY:  [name] [name] 47 y.o. female returns for a follow up before starting maintenance trastuzumab plus pertuzumab. She is accompanied by her boyfriend for this visit. She reports her energy and appetite have improved since completion of  her last chemotherapy. She has gained 4 lbs since 09/25/2022. She has noticed that her chronic migraines have returned since completion of her last treatment. She denies nausea, vomiting or abdominal pain. Her bowel habits are unchanged without recurrent episodes of diarrhea or constipation. She denies easy bruising  or signs of active bleeding. She denies any new breast pain or changes. She denies fevers, chills, sweats, shortness of breath, chest pain or cough. She has no other complaints and is willing to proceed with treatment today.    Patient Active Problem List   Diagnosis Date Noted   Genetic testing 05/30/2022   Family history of breast cancer 05/10/2022   Family history of ovarian cancer 05/10/2022   Malignant neoplasm of upper-outer quadrant of left breast in female, estrogen receptor positive 05/08/2022   Food impaction of esophagus    Schatzki's ring    Hx of migraine headaches 11/17/2013   Ureteral calculus 11/17/2013   GERD (gastroesophageal reflux disease) 11/17/2013   Esophageal obstruction due to food impaction 11/17/2013    is allergic to amoxicillin, coconut (cocos nucifera), penicillins, duloxetine, hydrocodone, hydrocodone-acetaminophen, and moxifloxacin.  MEDICAL HISTORY: Past Medical History:  Diagnosis Date   Anemia    history of   Breast cancer, left    Dyspnea    even with speaking   Family history of adverse reaction to anesthesia    mother slow to awaken   Family history of breast cancer 05/10/2022   Family history of ovarian cancer 05/10/2022   Fibromyalgia    GERD (gastroesophageal reflux disease)    History of kidney stones    last stone 6 months ago    Migraine    Seasonal allergies    Tinnitus    Vertigo     SURGICAL HISTORY: Past Surgical History:  Procedure Laterality Date   ADENOIDECTOMY  1991   CYSTOSCOPY WITH RETROGRADE PYELOGRAM, URETEROSCOPY AND STENT PLACEMENT Left 02/22/2021   Procedure: CYSTOSCOPY WITH RETROGRADE PYELOGRAM, URETEROSCOPY AND STENT PLACEMENT;  Surgeon: Belva AgeeNewsome, George B, MD;  Location: [facility];  Service: Urology;  Laterality: Left;   CYSTOSCOPY/URETEROSCOPY/HOLMIUM LASER/STENT PLACEMENT Left 03/10/2021   Procedure: CYSTOSCOPY RETROGRADE, LEFT URETEROSCOPY/ STONE BASKETRY /HOLMIUM LASER/STENT EXCHANGE;  Surgeon: Crist FatHerrick,  Benjamin W, MD;  [facility] [place];  Service: Urology;  Laterality: Left;   ESOPHAGOGASTRODUODENOSCOPY N/A 11/17/2013   Procedure: ESOPHAGOGASTRODUODENOSCOPY (EGD);  Surgeon: Rachael Feeaniel P Jacobs, MD;  [facility];  Service: Endoscopy;  Laterality: N/A;   ESOPHAGOGASTRODUODENOSCOPY N/A 10/15/2015   Procedure: ESOPHAGOGASTRODUODENOSCOPY (EGD);  Surgeon: Iva Booparl E Gessner, MD;  [facility];  Service: Endoscopy;  Laterality: N/A;   HOLMIUM LASER APPLICATION  03/10/2021   Procedure: HOLMIUM LASER APPLICATION;  Surgeon: Crist FatHerrick, Benjamin W, MD;  [facility] [place];  Service: Urology;;   NASAL SEPTOPLASTY W/ TURBINOPLASTY Bilateral 03/18/2018   Procedure: BILATERAL NASAL SEPTOPLASTY WITH TURBINATE REDUCTION;  Surgeon: Flo ShanksWolicki, Karol, MD;  Location: [facility];  Service: ENT;  Laterality: Bilateral;   PORTACATH PLACEMENT Right 05/17/2022   Procedure: PORT-A-CATH INSERTION WITH ULTRASOUND GUIDANCE;  Surgeon: Abigail MiyamotoBlackman, Douglas, MD;  Location: [facility];  Service: General;  Laterality: Right;   TONSILLECTOMY  1991   TUBAL LIGATION  2005    SOCIAL HISTORY: Social History   Socioeconomic History   Marital status: Single    Spouse name: Not on file   Number of children: Not on file   Years of education: Not on file   Highest education level: Not on file  Occupational History  Not on file  Tobacco Use   Smoking status: Never   Smokeless tobacco: Never  Vaping Use   Vaping Use: Never used  Substance and Sexual Activity   Alcohol use: No   Drug use: No   Sexual activity: Not on file  Other Topics Concern   Not on file  Social History Narrative   ** Merged History Encounter **       Social Determinants of Health   Financial Resource Strain: Low Risk  (05/10/2022)   Overall Financial Resource Strain (CARDIA)    Difficulty of Paying Living Expenses: Not very hard  Food Insecurity: No Food Insecurity (05/10/2022)   Hunger Vital  Sign    Worried About Running Out of Food in the Last Year: Never true    Ran Out of Food in the Last Year: Never true  Transportation Needs: No Transportation Needs (05/10/2022)   PRAPARE - Administrator, Civil Service (Medical): No    Lack of Transportation (Non-Medical): No  Physical Activity: Not on file  Stress: Not on file  Social Connections: Not on file  Intimate Partner Violence: Not on file    FAMILY HISTORY: Family History  Problem Relation Age of Onset   Asthma Mother    Diabetes Mother    Colon polyps Mother    Ovarian cancer Maternal Grandmother 24   Diabetes Maternal Grandmother    Esophageal cancer Maternal Grandfather        d. 67   Breast cancer Other        MGM's sisters x2   Cervical cancer Cousin        mat cousin; dx <40    REVIEW OF SYSTEMS:   Constitutional: Negative for appetite change, fatigue, chills, fever and unexpected weight change HENT: Negative for mouth sores, nosebleeds, sore throat and trouble swallowing.   Eyes: Negative for eye problems and icterus.  Respiratory: Negative for cough, hemoptysis, shortness of breath and wheezing.   Cardiovascular: Negative for chest pain and leg swelling.  Gastrointestinal: Negative for abdominal pain, constipation, diarrhea, nausea and vomiting.  Genitourinary: Negative for bladder incontinence, difficulty urinating, dysuria, frequency and hematuria.   Musculoskeletal: Negative for back pain, gait problem, neck pain and neck stiffness.  Skin:Negative for rash and ulcers Neurological: Negative for dizziness, extremity weakness, gait problem, light-headedness and seizures. +Migraines, chronic Hematological: Negative for adenopathy. Does not bruise/bleed easily.  Psychiatric/Behavioral: Negative for confusion, depression and sleep disturbance. The patient is not nervous/anxious.     PHYSICAL EXAMINATION  ECOG PERFORMANCE STATUS: 1 - Symptomatic but completely ambulatory  Vitals:   10/17/22  0814  BP: 130/79  Pulse: 85  Resp: 18  Temp: (!) 97.3 F (36.3 C)  SpO2: 100%   Constitutional: Oriented to person, place, and time and well-developed, well-nourished, and in no distress.  HENT:  Head: Normocephalic and atraumatic.  Eyes: Conjunctivae are normal. Right eye exhibits no discharge. Left eye exhibits no discharge. No scleral icterus.  Neck: Normal range of motion. Neck supple.   Cardiovascular: Normal rate, regular rhythm, normal heart sounds Pulmonary/Chest: Effort normal and breath sounds normal. No respiratory distress. No wheezes. No rales.  Musculoskeletal: Normal range of motion. Exhibits no edema.  Lymphadenopathy: No cervical adenopathy.  Neurological: Alert and oriented to person, place, and time. Exhibits normal muscle tone. Gait normal. Coordination normal.  Skin: Skin is warm and dry. No rash noted. Not diaphoretic. No erythema. No pallor.  Psychiatric: Mood, memory and judgment normal.    LABORATORY DATA:  CBC    Component Value Date/Time   WBC 2.8 (L) 10/17/2022 0740   WBC 8.2 02/21/2021 1833   RBC 2.87 (L) 10/17/2022 0740   HGB 10.0 (L) 10/17/2022 0740   HCT 28.8 (L) 10/17/2022 0740   PLT 150 10/17/2022 0740   MCV 100.3 (H) 10/17/2022 0740   MCH 34.8 (H) 10/17/2022 0740   MCHC 34.7 10/17/2022 0740   RDW 13.1 10/17/2022 0740   LYMPHSABS 0.8 10/17/2022 0740   MONOABS 0.3 10/17/2022 0740   EOSABS 0.0 10/17/2022 0740   BASOSABS 0.0 10/17/2022 0740    CMP     Component Value Date/Time   NA 142 10/17/2022 0740   K 3.3 (L) 10/17/2022 0740   CL 106 10/17/2022 0740   CO2 28 10/17/2022 0740   GLUCOSE 107 (H) 10/17/2022 0740   BUN 16 10/17/2022 0740   CREATININE 1.16 (H) 10/17/2022 0740   CALCIUM 9.3 10/17/2022 0740   PROT 6.3 (L) 10/17/2022 0740   ALBUMIN 3.9 10/17/2022 0740   AST 15 10/17/2022 0740   ALT 15 10/17/2022 0740   ALKPHOS 66 10/17/2022 0740   BILITOT 0.5 10/17/2022 0740   GFRNONAA 59 (L) 10/17/2022 0740   GFRAA >90 11/04/2014  1100       ASSESSMENT and THERAPY PLAN:  [name] [name] is a 47 y.o. female who presents to the clinic for continued management of left breast cancer.   #Malignant neoplasm of upper-outer quadrant of left breast in female, ER/PR/HER2 positive: --Treatment plan includes: 1.  Neoadjuvant chemotherapy with carboplatin/docetaxel/trastuzumab/pertuzumab  2.  Breast conserving surgery with sentinel lymph node biopsy 3.  Maintenance Herceptin Perjeta to complete a year of therapy 4.  Adjuvant radiation 5.  Antiestrogen therapy --Completed 6 cycles of neoadjuvant carboplatin/docetaxel/trastuzumab/pertuzumab from 05/18/2022-09/14/2022. --MRI breast from 09/15/2022 revealed complete resolution enhancement associated malignancy in the lateral portion of the left breast. No new suspicious findings in either breast, no adenopathy.  PLAN: --Due to start Cycle 1 of maintenance trastuzumab plus pertuzumab today.  --Labs from today reviewed and adequate for treatment. WBC 2.8, Hgb 10.0, Plt 150K. Creatinine 1.16. --Repeat Echo from 09/29/2022 showed stable EF of 60-65%. --Proceed with treatment today without any dose modifications.  --Encouraged hydration with PO fluids drinking 1-2 L per day. Will give 500 cc of IV fluids with treatment today.  --Scheduled for left breast lumpectomy with sentinel lymph node biopsy with Dr. Magnus Ivan tomorrow.  --RTC in 3 weeks with labs, follow up visit before Cycle 2 of trastuzumab plus pertuzumab.   #Hypokalemia: --Potassium level has improved to 3.3 today --Will give 20 mEq of oral potassium chloride today in infusion.   #Migraines: --Intermittent and chronic in nature --No neurological deficits including vision changes, weakness, etc.  --Patient will monitor frequency over the next 1-2 weeks and follow up with clinic if episodes become more frequent.    No problem-specific Assessment & Plan notes found for this encounter.  All questions were answered. The  patient knows to call the clinic with any problems, questions or concerns. We can certainly see the patient much sooner if necessary.  I have spent a total of 30 minutes minutes of face-to-face and non-face-to-face time, preparing to see the patient, performing a medically appropriate examination, counseling and educating the patient, ordering medications, documenting clinical information in the electronic health record, and care coordination.   Georga Kaufmann PA-C Dept of Hematology and Oncology [facility] Phone: [phone]

## 2022-10-18 ENCOUNTER — Other Ambulatory Visit: Payer: Self-pay | Admitting: Surgery

## 2022-10-18 ENCOUNTER — Encounter (HOSPITAL_BASED_OUTPATIENT_CLINIC_OR_DEPARTMENT_OTHER): Payer: Self-pay | Admitting: Anesthesiology

## 2022-10-18 ENCOUNTER — Other Ambulatory Visit: Payer: Self-pay

## 2022-10-18 ENCOUNTER — Ambulatory Visit (HOSPITAL_BASED_OUTPATIENT_CLINIC_OR_DEPARTMENT_OTHER)
Admission: RE | Admit: 2022-10-18 | Discharge: 2022-10-18 | Disposition: A | Discharge status: Home / Self Care | Payer: 59 | Source: Ambulatory Visit | Attending: Surgery | Admitting: Surgery

## 2022-10-18 ENCOUNTER — Encounter (HOSPITAL_BASED_OUTPATIENT_CLINIC_OR_DEPARTMENT_OTHER): Payer: Self-pay | Admitting: Surgery

## 2022-10-18 ENCOUNTER — Encounter (HOSPITAL_BASED_OUTPATIENT_CLINIC_OR_DEPARTMENT_OTHER)
Admission: RE | Disposition: A | Discharge status: Home / Self Care | Payer: Self-pay | Source: Ambulatory Visit | Attending: Surgery

## 2022-10-18 DIAGNOSIS — Z538 Procedure and treatment not carried out for other reasons: Secondary | ICD-10-CM | POA: Diagnosis not present

## 2022-10-18 DIAGNOSIS — C50912 Malignant neoplasm of unspecified site of left female breast: Secondary | ICD-10-CM | POA: Diagnosis not present

## 2022-10-18 DIAGNOSIS — Z853 Personal history of malignant neoplasm of breast: Secondary | ICD-10-CM

## 2022-10-18 DIAGNOSIS — Z9221 Personal history of antineoplastic chemotherapy: Secondary | ICD-10-CM | POA: Diagnosis not present

## 2022-10-18 DIAGNOSIS — Z01818 Encounter for other preprocedural examination: Secondary | ICD-10-CM

## 2022-10-18 LAB — POCT PREGNANCY, URINE: Preg Test, Ur: NEGATIVE

## 2022-10-18 SURGERY — CANCELLED PROCEDURE
Anesthesia: General | Site: Breast | Laterality: Left

## 2022-10-18 MED ORDER — ACETAMINOPHEN 500 MG PO TABS
ORAL_TABLET | ORAL | Status: AC
  Filled 2022-10-18: qty 2

## 2022-10-18 MED ORDER — FENTANYL CITRATE (PF) 100 MCG/2ML IJ SOLN
INTRAMUSCULAR | Status: AC
  Filled 2022-10-18: qty 2

## 2022-10-18 MED ORDER — CHLORHEXIDINE GLUCONATE CLOTH 2 % EX PADS: 6.0000 | MEDICATED_PAD | Freq: Once | CUTANEOUS | Status: DC

## 2022-10-18 MED ORDER — MIDAZOLAM HCL 2 MG/2ML IJ SOLN
INTRAMUSCULAR | Status: AC
  Filled 2022-10-18: qty 2

## 2022-10-18 MED ORDER — VANCOMYCIN HCL IN DEXTROSE 1-5 GM/200ML-% IV SOLN: 1000.0000 mg | INTRAVENOUS | Status: DC

## 2022-10-18 MED ORDER — ACETAMINOPHEN 500 MG PO TABS
1000.0000 mg | ORAL_TABLET | ORAL | Status: AC
  Administered 2022-10-18: 1000 mg via ORAL

## 2022-10-18 MED ORDER — LACTATED RINGERS IV SOLN: INTRAVENOUS | Status: DC

## 2022-10-18 MED ORDER — VANCOMYCIN HCL IN DEXTROSE 1-5 GM/200ML-% IV SOLN
INTRAVENOUS | Status: AC
  Filled 2022-10-18: qty 200

## 2022-10-18 SURGICAL SUPPLY — 46 items
APPLIER CLIP 9.375 MED OPEN (MISCELLANEOUS)
BLADE CLIPPER SURG (BLADE) IMPLANT
BLADE SURG 15 STRL LF DISP TIS (BLADE) ×4 IMPLANT
BLADE SURG 15 STRL SS (BLADE) ×4
CANISTER SUCT 1200ML W/VALVE (MISCELLANEOUS) IMPLANT
CHLORAPREP W/TINT 26 (MISCELLANEOUS) ×2 IMPLANT
CLIP APPLIE 9.375 MED OPEN (MISCELLANEOUS) IMPLANT
COVER BACK TABLE 60X90IN (DRAPES) ×4 IMPLANT
COVER MAYO STAND STRL (DRAPES) ×2 IMPLANT
COVER PROBE CYLINDRICAL 5X96 (MISCELLANEOUS) ×2 IMPLANT
DERMABOND ADVANCED .7 DNX12 (GAUZE/BANDAGES/DRESSINGS) ×2 IMPLANT
DRAIN CHANNEL 19F RND (DRAIN) IMPLANT
DRAPE LAPAROSCOPIC ABDOMINAL (DRAPES) ×2 IMPLANT
DRAPE UTILITY XL STRL (DRAPES) ×2 IMPLANT
ELECT REM PT RETURN 9FT ADLT (ELECTROSURGICAL) ×2
ELECTRODE REM PT RTRN 9FT ADLT (ELECTROSURGICAL) ×2 IMPLANT
EVACUATOR SILICONE 100CC (DRAIN) IMPLANT
GAUZE SPONGE 4X4 12PLY STRL LF (GAUZE/BANDAGES/DRESSINGS) IMPLANT
GLOVE SURG SIGNA 7.5 PF LTX (GLOVE) ×2 IMPLANT
GOWN STRL REUS W/ TWL LRG LVL3 (GOWN DISPOSABLE) ×2 IMPLANT
GOWN STRL REUS W/ TWL XL LVL3 (GOWN DISPOSABLE) ×2 IMPLANT
GOWN STRL REUS W/TWL LRG LVL3 (GOWN DISPOSABLE) ×2
GOWN STRL REUS W/TWL XL LVL3 (GOWN DISPOSABLE) ×2
HEMOSTAT SURGICEL 2X14 (HEMOSTASIS) IMPLANT
KIT MARKER MARGIN INK (KITS) IMPLANT
NDL SAFETY ECLIP 18X1.5 (MISCELLANEOUS) ×2 IMPLANT
NEEDLE HYPO 25X1 1.5 SAFETY (NEEDLE) ×4 IMPLANT
NS IRRIG 1000ML POUR BTL (IV SOLUTION) IMPLANT
PACK BASIN DAY SURGERY FS (CUSTOM PROCEDURE TRAY) ×2 IMPLANT
PENCIL SMOKE EVACUATOR (MISCELLANEOUS) IMPLANT
PIN SAFETY STERILE (MISCELLANEOUS) IMPLANT
SLEEVE SCD COMPRESS KNEE MED (STOCKING) ×2 IMPLANT
SPIKE FLUID TRANSFER (MISCELLANEOUS) IMPLANT
SPONGE T-LAP 18X18 ~~LOC~~+RFID (SPONGE) IMPLANT
SPONGE T-LAP 4X18 ~~LOC~~+RFID (SPONGE) ×2 IMPLANT
SUT ETHILON 3 0 PS 1 (SUTURE) IMPLANT
SUT MNCRL AB 4-0 PS2 18 (SUTURE) IMPLANT
SUT VIC AB 3-0 SH 27 (SUTURE)
SUT VIC AB 3-0 SH 27X BRD (SUTURE) IMPLANT
SYR BULB EAR ULCER 3OZ GRN STR (SYRINGE) IMPLANT
SYR CONTROL 10ML LL (SYRINGE) ×4 IMPLANT
TOWEL GREEN STERILE FF (TOWEL DISPOSABLE) ×2 IMPLANT
TRACER MAGTRACE VIAL (MISCELLANEOUS) IMPLANT
TRAY FAXITRON CT DISP (TRAY / TRAY PROCEDURE) IMPLANT
TUBE CONNECTING 20X1/4 (TUBING) ×2 IMPLANT
YANKAUER SUCT BULB TIP NO VENT (SUCTIONS) IMPLANT

## 2022-10-18 NOTE — Progress Notes (Signed)
Patient ID: [name] [name], female   DOB: 02-Apr-1976, 47 y.o.   MRN: [id number]   Unfortunately, the patient did not have a radioactive seed placed preop.  I am not sure where the breakdown happened preoperatively.  I will notify our office and we will work on rescheduling surgery  I explained this to [name]

## 2022-10-19 ENCOUNTER — Encounter (HOSPITAL_BASED_OUTPATIENT_CLINIC_OR_DEPARTMENT_OTHER): Payer: Self-pay | Admitting: Surgery

## 2022-10-19 ENCOUNTER — Encounter: Payer: Self-pay | Admitting: *Deleted

## 2022-10-19 DIAGNOSIS — Z170 Estrogen receptor positive status [ER+]: Secondary | ICD-10-CM

## 2022-10-20 ENCOUNTER — Other Ambulatory Visit: Payer: Self-pay | Admitting: Surgery

## 2022-10-20 ENCOUNTER — Telehealth: Payer: Self-pay | Admitting: Radiation Oncology

## 2022-10-20 DIAGNOSIS — Z853 Personal history of malignant neoplasm of breast: Secondary | ICD-10-CM

## 2022-10-20 NOTE — Telephone Encounter (Signed)
Left message for patient to call back to schedule consult per 4/11 referral.

## 2022-10-24 ENCOUNTER — Telehealth: Payer: Self-pay | Admitting: Hematology and Oncology

## 2022-10-24 ENCOUNTER — Other Ambulatory Visit: Payer: Self-pay

## 2022-10-24 NOTE — Telephone Encounter (Signed)
Returned patient's vm to r/s 4/30 tx due to surgery. Worked with MD and RN and patient rescheduled and notified.

## 2022-10-26 ENCOUNTER — Ambulatory Visit: Payer: Self-pay

## 2022-10-26 ENCOUNTER — Ambulatory Visit: Payer: Self-pay | Admitting: Hematology and Oncology

## 2022-10-26 ENCOUNTER — Other Ambulatory Visit: Payer: Self-pay

## 2022-10-28 ENCOUNTER — Other Ambulatory Visit: Payer: Self-pay

## 2022-11-01 NOTE — Progress Notes (Signed)
Surgical Instructions    Your procedure is scheduled on November 07, 2022.  Report to [facility] Main Entrance "A" at 2:00 P.M., then check in with the Admitting office.  Call this number if you have problems the morning of surgery:  [phone]   If you have any questions prior to your surgery date call [phone]: Open Monday-Friday 8am-4pm If you experience any cold or flu symptoms such as cough, fever, chills, shortness of breath, etc. between now and your scheduled surgery, please notify us at the above number     Remember:  Do not eat after midnight the night before your surgery  You may drink clear liquids until 1:00PM the dayof your surgery.   Clear liquids allowed are: Water, Non-Citrus Juices (without pulp), Carbonated Beverages, Clear Tea, Black Coffee ONLY (NO MILK, CREAM OR POWDERED CREAMER of any kind), and Gatorade    Take these medicines the morning of surgery with A SIP OF WATER:  NONE  As of today, STOP taking any Aspirin (unless otherwise instructed by your surgeon) Aleve, Naproxen, Ibuprofen, Motrin, Advil, Goody's, BC's, all herbal medications, fish oil, and all vitamins.          [place] is not responsible for any belongings or valuables.    Do NOT Smoke (Tobacco/Vaping)  24 hours prior to your procedure  If you use a CPAP at night, you may bring your mask for your overnight stay.   Contacts, glasses, hearing aids, dentures or partials may not be worn into surgery, please bring cases for these belongings   For patients admitted to the hospital, discharge time will be determined by your treatment team.   Patients discharged the day of surgery will not be allowed to drive home, and someone needs to stay with them for 24 hours.   SURGICAL WAITING ROOM VISITATION Patients having surgery or a procedure may have no more than 2 support people in the waiting area - these visitors may rotate.   Children under the age of 1 must have an adult with them who is  not the patient. If the patient needs to stay at the hospital during part of their recovery, the visitor guidelines for inpatient rooms apply. Pre-op nurse will coordinate an appropriate time for 1 support person to accompany patient in pre-op.  This support person may not rotate.   Please refer to [email or web address] for the visitor guidelines for Inpatients (after your surgery is over and you are in a regular room).    Special instructions:    Oral Hygiene is also important to reduce your risk of infection.  Remember - BRUSH YOUR TEETH THE MORNING OF SURGERY WITH YOUR REGULAR TOOTHPASTE   - Preparing For Surgery  Before surgery, you can play an important role. Because skin is not sterile, your skin needs to be as free of germs as possible. You can reduce the number of germs on your skin by washing with CHG (chlorahexidine gluconate) Soap before surgery.  CHG is an antiseptic cleaner which kills germs and bonds with the skin to continue killing germs even after washing.     Please do not use if you have an allergy to CHG or antibacterial soaps. If your skin becomes reddened/irritated stop using the CHG.  Do not shave (including legs and underarms) for at least 48 hours prior to first CHG shower. It is OK to shave your face.  Please follow these instructions carefully.     Shower the NIGHT BEFORE SURGERY and the [facility]  OF SURGERY with CHG Soap.   If you chose to wash your hair, wash your hair first as usual with your normal shampoo. After you shampoo, rinse your hair and body thoroughly to remove the shampoo.  Then [employer] and genitals (private parts) with your normal soap and rinse thoroughly to remove soap.  After that Use CHG Soap as you would any other liquid soap. You can apply CHG directly to the skin and wash gently with a scrungie or a clean washcloth.   Apply the CHG Soap to your body ONLY FROM THE NECK DOWN.  Do not  use on open wounds or open sores. Avoid contact with your eyes, ears, mouth and genitals (private parts). Wash Face and genitals (private parts)  with your normal soap.   Wash thoroughly, paying special attention to the area where your surgery will be performed.  Thoroughly rinse your body with warm water from the neck down.  DO NOT shower/wash with your normal soap after using and rinsing off the CHG Soap.  Pat yourself dry with a CLEAN TOWEL.  Wear CLEAN PAJAMAS to bed the night before surgery  Place CLEAN SHEETS on your bed the night before your surgery  DO NOT SLEEP WITH PETS.   Day of Surgery:  Take a shower with CHG soap. Wear Clean/Comfortable clothing the morning of surgery Do not wear jewelry or makeup. Do not wear lotions, powders, perfumes/cologne or deodorant. Do not shave 48 hours prior to surgery.  Men may shave face and neck. Do not bring valuables to the hospital. Do not wear nail polish, gel polish, artificial nails, or any other type of covering on natural nails (fingers and toes) If you have artificial nails or gel coating that need to be removed by a nail salon, please have this removed prior to surgery. Artificial nails or gel coating may interfere with anesthesia's ability to adequately monitor your vital signs. Remember to brush your teeth WITH YOUR REGULAR TOOTHPASTE.    If you received a COVID test during your pre-op visit, it is requested that you wear a mask when out in public, stay away from anyone that may not be feeling well, and notify your surgeon if you develop symptoms. If you have been in contact with anyone that has tested positive in the last 10 days, please notify your surgeon.    Please read over the following fact sheets that you were given.

## 2022-11-02 ENCOUNTER — Encounter (HOSPITAL_COMMUNITY): Payer: Self-pay

## 2022-11-02 ENCOUNTER — Other Ambulatory Visit: Payer: Self-pay

## 2022-11-02 ENCOUNTER — Encounter (HOSPITAL_COMMUNITY)
Admission: RE | Admit: 2022-11-02 | Discharge: 2022-11-02 | Disposition: A | Discharge status: Home / Self Care | Payer: 59 | Source: Ambulatory Visit | Attending: Surgery | Admitting: Surgery

## 2022-11-02 VITALS — BP 121/75 | HR 80 | Temp 98.3°F | Resp 18 | Ht 68.0 in | Wt 169.9 lb

## 2022-11-02 DIAGNOSIS — Z01812 Encounter for preprocedural laboratory examination: Secondary | ICD-10-CM | POA: Insufficient documentation

## 2022-11-02 DIAGNOSIS — Z01818 Encounter for other preprocedural examination: Secondary | ICD-10-CM

## 2022-11-02 LAB — POCT PREGNANCY, URINE: Preg Test, Ur: NEGATIVE

## 2022-11-02 NOTE — Progress Notes (Signed)
Surgical Instructions    Your procedure is scheduled on November 07, 2022.  Report to [facility] Main Entrance "A" at 2:00 P.M., then check in with the Admitting office.  Call this number if you have problems the morning of surgery:  [phone]   If you have any questions prior to your surgery date call [phone]: Open Monday-Friday 8am-4pm If you experience any cold or flu symptoms such as cough, fever, chills, shortness of breath, etc. between now and your scheduled surgery, please notify us at the above number     Remember:  Do not eat after midnight the night before your surgery  You may drink clear liquids until 1:00PM the dayof your surgery.   Clear liquids allowed are: Water, Non-Citrus Juices (without pulp), Carbonated Beverages, Clear Tea, Black Coffee ONLY (NO MILK, CREAM OR POWDERED CREAMER of any kind), and Gatorade    Enhanced Recovery after Surgery for Orthopedics Enhanced Recovery after Surgery is a protocol used to improve the stress on your body and your recovery after surgery.  Patient Instructions  The day of surgery (if you do NOT have diabetes):  Drink ONE (1) Pre-Surgery Clear Ensure by 1:00 pm the morning of surgery   This drink was given to you during your hospital  pre-op appointment visit. Nothing else to drink after completing the  Pre-Surgery Clear Ensure.        If you have questions, please contact your surgeon's office.    Take these medicines the morning of surgery with A SIP OF WATER:  NONE  As of today, STOP taking any Aspirin (unless otherwise instructed by your surgeon) Aleve, Naproxen, Ibuprofen, Motrin, Advil, Goody's, BC's, all herbal medications, fish oil, and all vitamins.  Special instructions:    Oral Hygiene is also important to reduce your risk of infection.  Remember - BRUSH YOUR TEETH THE MORNING OF SURGERY WITH YOUR REGULAR TOOTHPASTE   - Preparing For Surgery  Before surgery, you can play an important role.  Because skin is not sterile, your skin needs to be as free of germs as possible. You can reduce the number of germs on your skin by washing with CHG (chlorahexidine gluconate) Soap before surgery.  CHG is an antiseptic cleaner which kills germs and bonds with the skin to continue killing germs even after washing.     Please do not use if you have an allergy to CHG or antibacterial soaps. If your skin becomes reddened/irritated stop using the CHG.  Do not shave (including legs and underarms) for at least 48 hours prior to first CHG shower. It is OK to shave your face.  Please follow these instructions carefully.     Shower the NIGHT BEFORE SURGERY and the MORNING OF SURGERY with CHG Soap.   If you chose to wash your hair, wash your hair first as usual with your normal shampoo. After you shampoo, rinse your hair and body thoroughly to remove the shampoo.  Then [employer] and genitals (private parts) with your normal soap and rinse thoroughly to remove soap.  After that Use CHG Soap as you would any other liquid soap. You can apply CHG directly to the skin and wash gently with a scrungie or a clean washcloth.   Apply the CHG Soap to your body ONLY FROM THE NECK DOWN.  Do not use on open wounds or open sores. Avoid contact with your eyes, ears, mouth and genitals (private parts). Wash Face and genitals (private parts)  with your normal soap.  Wash thoroughly, paying special attention to the area where your surgery will be performed.  Thoroughly rinse your body with warm water from the neck down.  DO NOT shower/wash with your normal soap after using and rinsing off the CHG Soap.  Pat yourself dry with a CLEAN TOWEL.  Wear CLEAN PAJAMAS to bed the night before surgery  Place CLEAN SHEETS on your bed the night before your surgery  DO NOT SLEEP WITH PETS.   Day of Surgery:  Take a shower with CHG soap. Wear Clean/Comfortable clothing the morning of surgery Do not wear jewelry or  makeup. Do not wear lotions, powders, perfumes/cologne or deodorant. Do not shave 48 hours prior to surgery.  Men may shave face and neck. Do not bring valuables to the hospital. Do not wear nail polish, gel polish, artificial nails, or any other type of covering on natural nails (fingers and toes) If you have artificial nails or gel coating that need to be removed by a nail salon, please have this removed prior to surgery. Artificial nails or gel coating may interfere with anesthesia's ability to adequately monitor your vital signs. Remember to brush your teeth WITH YOUR REGULAR TOOTHPASTE.   is not responsible for any belongings or valuables.    Do NOT Smoke (Tobacco/Vaping)  24 hours prior to your procedure  If you use a CPAP at night, you may bring your mask for your overnight stay.   Contacts, glasses, hearing aids, dentures or partials may not be worn into surgery, please bring cases for these belongings   For patients admitted to the hospital, discharge time will be determined by your treatment team.   Patients discharged the day of surgery will not be allowed to drive home, and someone needs to stay with them for 24 hours.   SURGICAL WAITING ROOM VISITATION Patients having surgery or a procedure may have no more than 2 support people in the waiting area - these visitors may rotate.   Children under the age of 84 must have an adult with them who is not the patient. If the patient needs to stay at the hospital during part of their recovery, the visitor guidelines for inpatient rooms apply. Pre-op nurse will coordinate an appropriate time for 1 support person to accompany patient in pre-op.  This support person may not rotate.   Please refer to [email or web address] for the visitor guidelines for Inpatients (after your surgery is over and you are in a regular room).    If you received a COVID test during your pre-op visit,  it is requested that you wear a mask when out in public, stay away from anyone that may not be feeling well, and notify your surgeon if you develop symptoms. If you have been in contact with anyone that has tested positive in the last 10 days, please notify your surgeon.    Please read over the following fact sheets that you were given.

## 2022-11-02 NOTE — Progress Notes (Signed)
PCP - Adelene Amas, DO Cardiologist - Denies Oncologist: Iverson Alamin, MD  PPM/ICD - Denies  Chest x-ray - Denies EKG - 05/17/2022 Stress Test - Denies ECHO - 09/29/2022 Cardiac Cath - Denies  Sleep Study - Denies  DM: Denies  Blood Thinner Instructions: N/A Aspirin Instructions: N/A  ERAS Protcol - Yes PRE-SURGERY Ensure or G2- Ensure  COVID TEST- N/A   Anesthesia review: Yes, seed placement on 11/06/2022 at 1400.  Patient denies shortness of breath, fever, cough and chest pain at PAT appointment   All instructions explained to the patient, with a verbal understanding of the material. Patient agrees to go over the instructions while at home for a better understanding.The opportunity to ask questions was provided.

## 2022-11-03 ENCOUNTER — Other Ambulatory Visit: Payer: Self-pay

## 2022-11-06 ENCOUNTER — Other Ambulatory Visit: Payer: Self-pay | Admitting: Surgery

## 2022-11-06 ENCOUNTER — Ambulatory Visit
Admission: RE | Admit: 2022-11-06 | Discharge: 2022-11-06 | Disposition: A | Discharge status: Home / Self Care | Payer: 59 | Source: Ambulatory Visit | Attending: Surgery | Admitting: Surgery

## 2022-11-06 DIAGNOSIS — Z853 Personal history of malignant neoplasm of breast: Secondary | ICD-10-CM

## 2022-11-06 DIAGNOSIS — C50912 Malignant neoplasm of unspecified site of left female breast: Secondary | ICD-10-CM | POA: Diagnosis not present

## 2022-11-06 DIAGNOSIS — N6320 Unspecified lump in the left breast, unspecified quadrant: Secondary | ICD-10-CM

## 2022-11-06 HISTORY — PX: BREAST BIOPSY: SHX20

## 2022-11-06 IMAGING — CT CT RENAL STONE PROTOCOL
2 of 4 series · 16 of 46 positions shown, 18 images · non-contrast
Comparison: None.

CLINICAL DATA: Left flank pain

EXAM:
CT ABDOMEN AND PELVIS WITHOUT CONTRAST
TECHNIQUE: Multidetector CT imaging of the abdomen and pelvis was performed
following the standard protocol without IV contrast.

[Series 2: stone full · axial · 0.84mm/px · z∈[+673,+1113]mm · 13 of 96 slices shown, 15 images]
[im 4/96  soft-tissue]
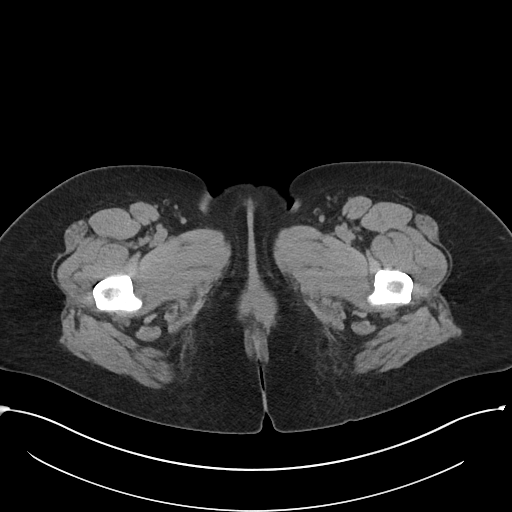
[im 4/96  bone]
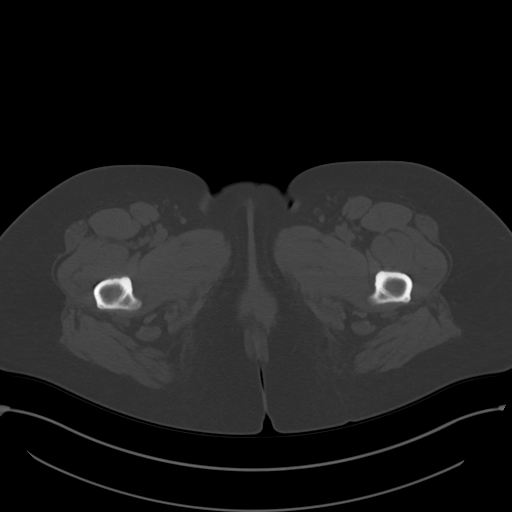
[im 12/96  soft-tissue]
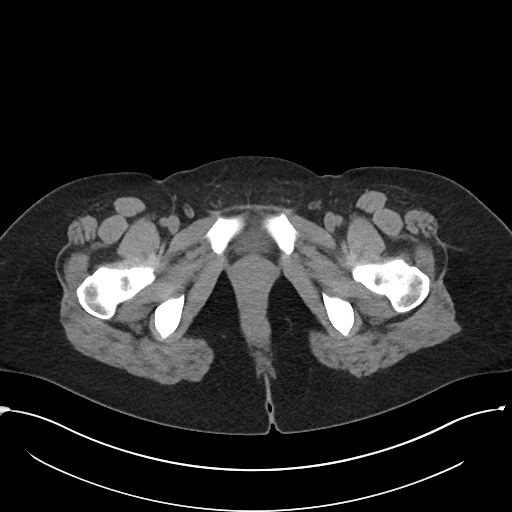
[im 20/96  soft-tissue]
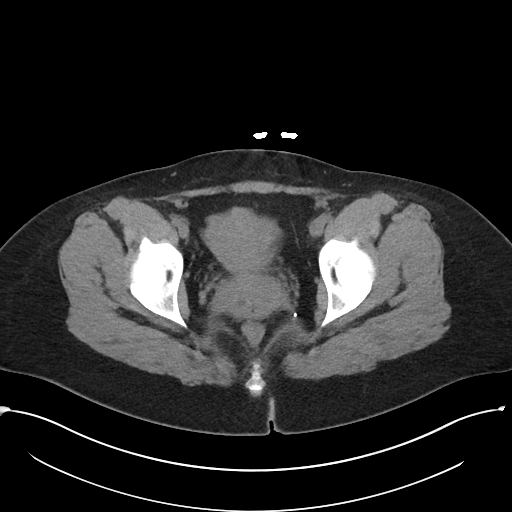
[im 28/96  soft-tissue]
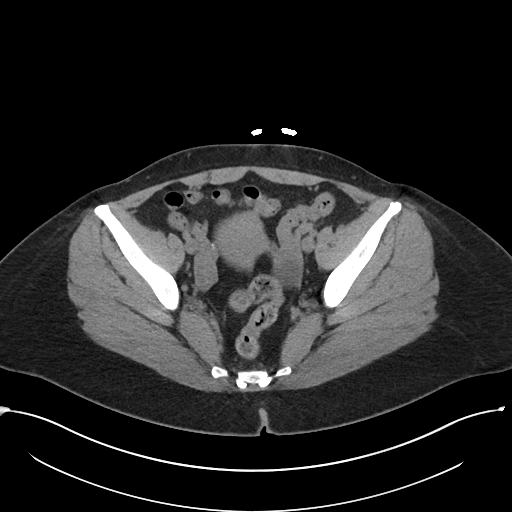
[im 32/96  soft-tissue]
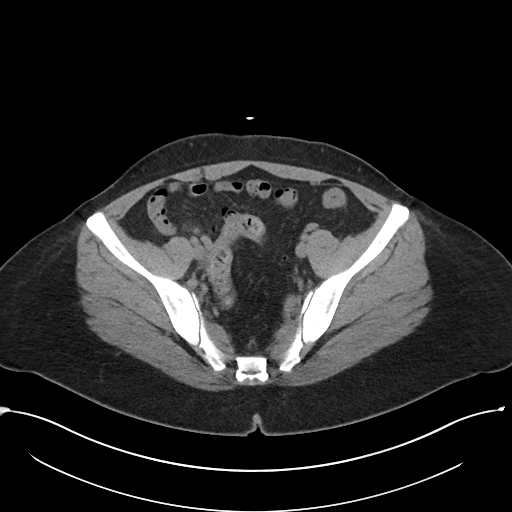
[im 40/96  soft-tissue]
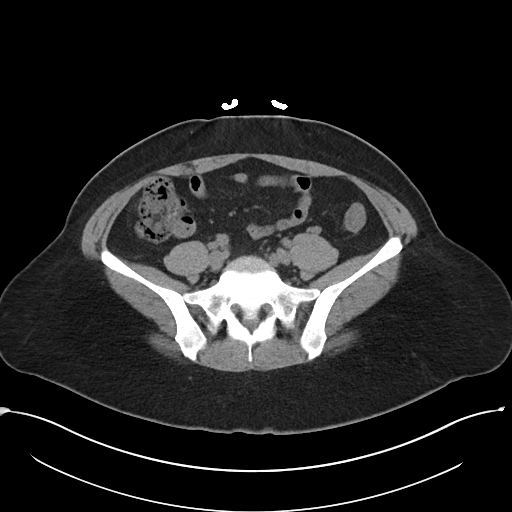
[im 48/96  soft-tissue]
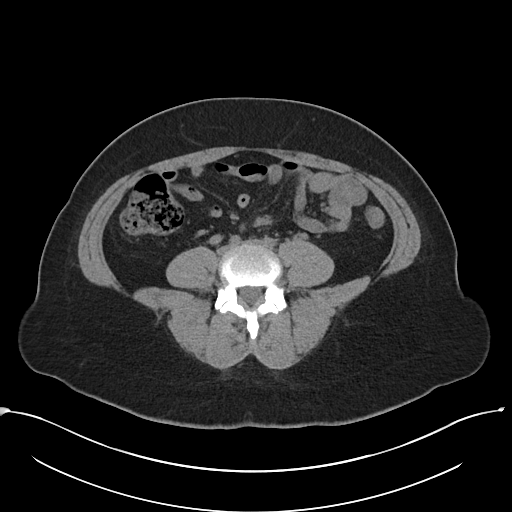
[im 56/96  soft-tissue]
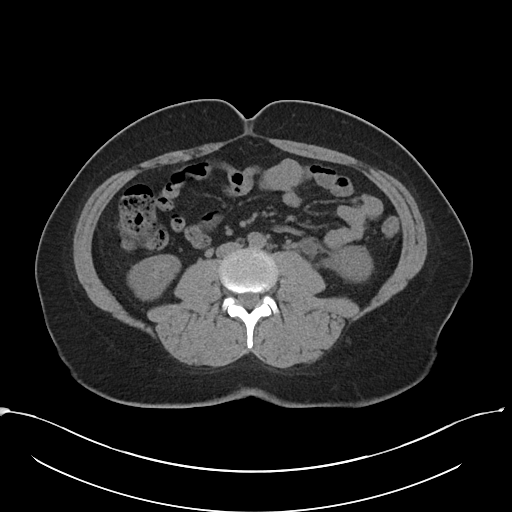
[im 64/96  soft-tissue]
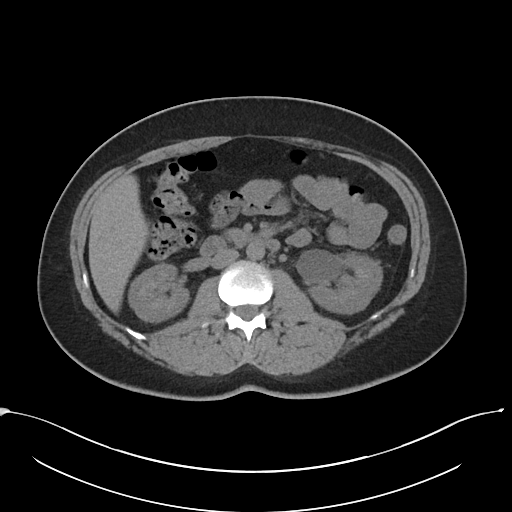
[im 64/96  bone]
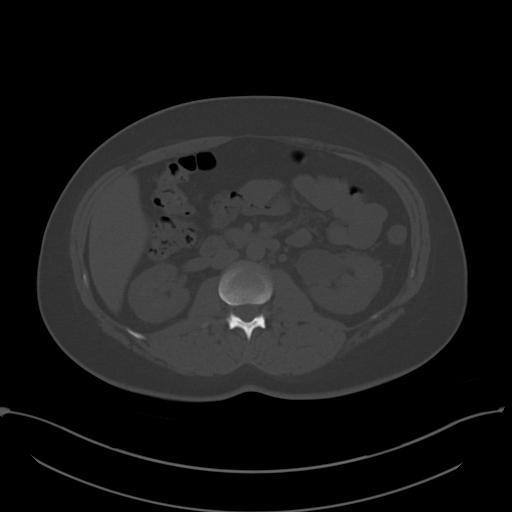
[im 68/96  soft-tissue]
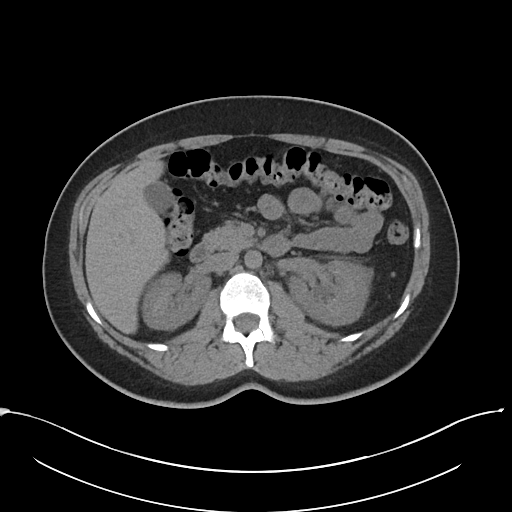
[im 76/96  soft-tissue]
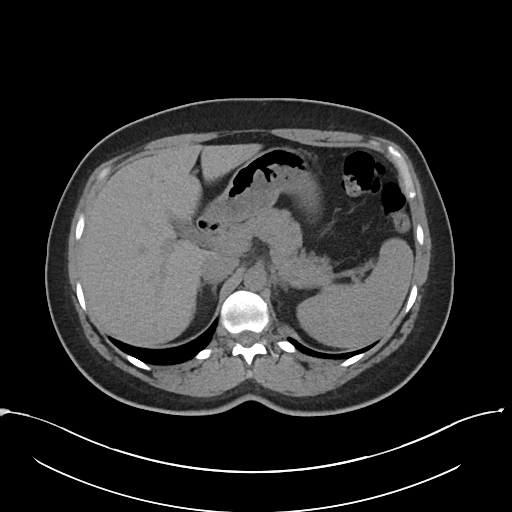
[im 84/96  soft-tissue]
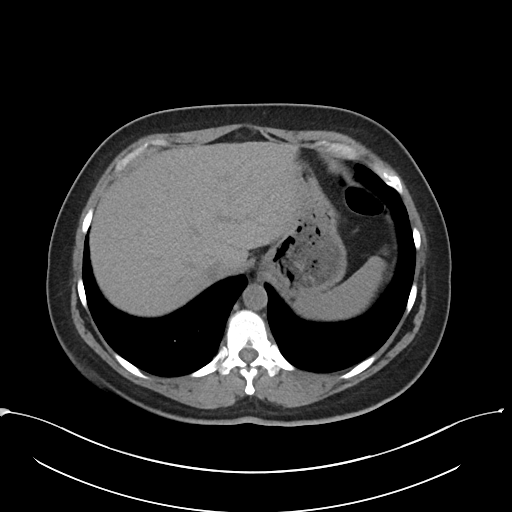
[im 92/96  soft-tissue]
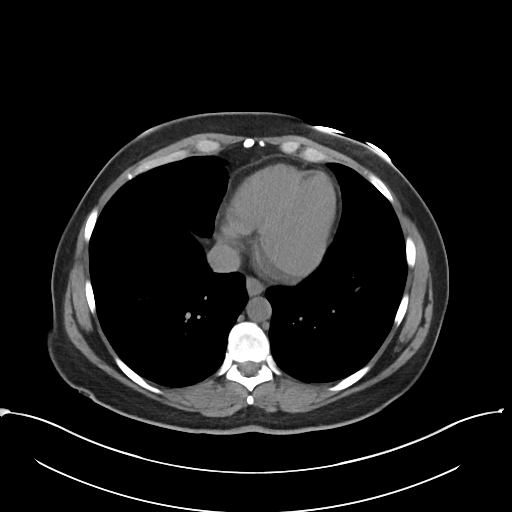

[Series 4: coronal · coronal · 0.69mm/px · 3 of 97 slices shown]
[im 33/97  soft-tissue]
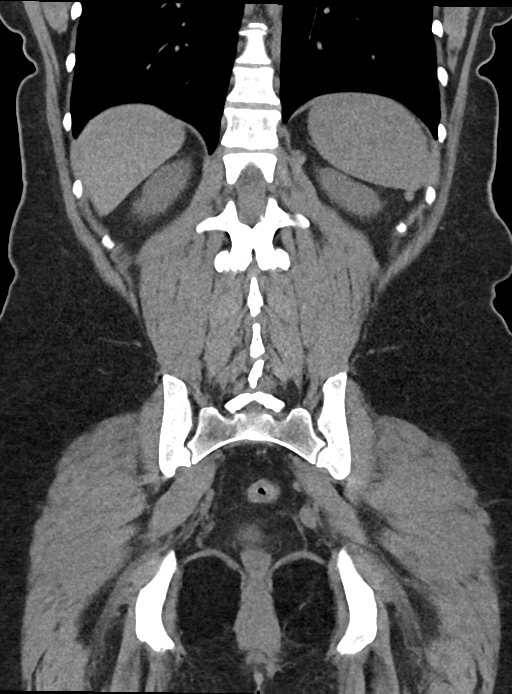
[im 43/97  soft-tissue]
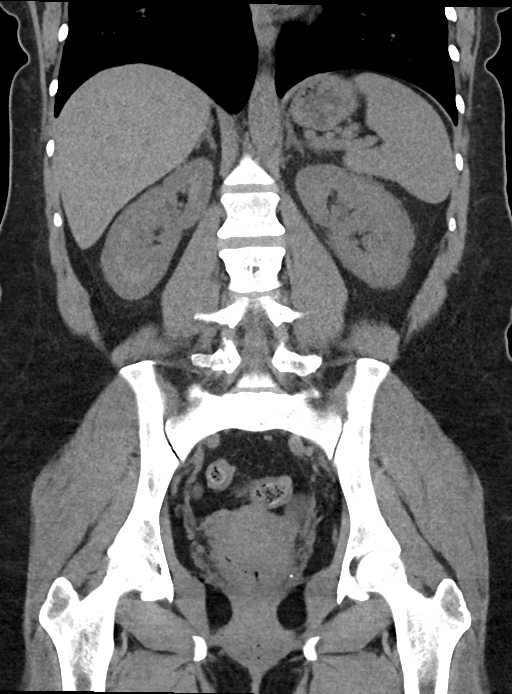
[im 54/97  soft-tissue]
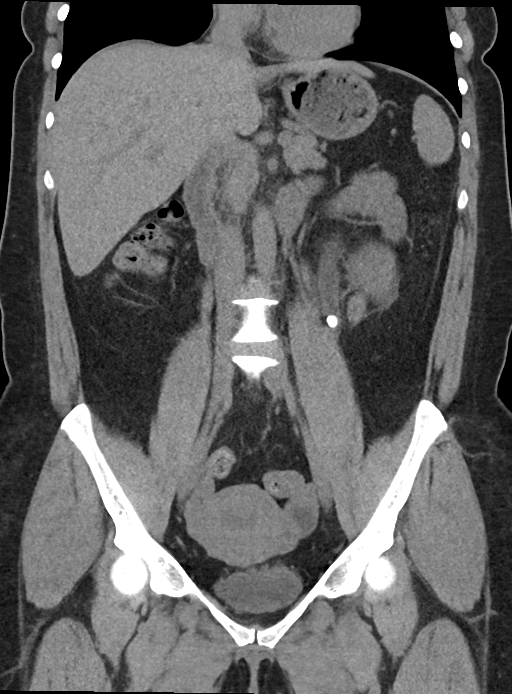

[16 of 46 positions shown; findings below may reference images not displayed]

FINDINGS: LOWER CHEST: Normal.

HEPATOBILIARY: Normal hepatic contours. No intra- or extrahepatic
biliary dilatation. The gallbladder is normal.

PANCREAS: Normal pancreas. No ductal dilatation or peripancreatic
fluid collection.

SPLEEN: Normal.

ADRENALS/URINARY TRACT: The adrenal glands are normal. Moderate left
hydroureteronephrosis with 10 mm obstructing stone in the proximal
left ureter. No right hydroureteronephrosis or
nephroureterolithiasis. The urinary bladder is normal for degree of
distention

STOMACH/BOWEL: There is no hiatal hernia. Normal duodenal course and
caliber. No small bowel dilatation or inflammation. No focal colonic
abnormality. Normal appendix.

VASCULAR/LYMPHATIC: Normal course and caliber of the major abdominal
vessels. No abdominal or pelvic lymphadenopathy.

REPRODUCTIVE: Normal uterus. No adnexal mass.

MUSCULOSKELETAL. No bony spinal canal stenosis or focal osseous
abnormality.

OTHER: None.
IMPRESSION: Moderate left hydroureteronephrosis with 10 mm obstructing stone in
the proximal left ureter.

## 2022-11-06 NOTE — H&P (Signed)
PROVIDER: Wayne Both, MD  MRN: [id number] DOB: 05-Jun-1976  Subjective  Chief Complaint: Post Operative Visit and New Consultation   History of Present Illness: [name] [name] is a 47 y.o. female who is seen t for a long-term follow-up regarding her left breast cancer. She has completed neoadjuvant chemotherapy and her follow-up MRI of the left breast showed complete resolution of her mass as well as no enlarged lymph nodes in the axilla. She has been doing very well and has no complaints.  Her original surgery was rescheduled as our office had not scheduled for radioactive seed to be placed.   Review of Systems: A complete review of systems was obtained from the patient. I have reviewed this information and discussed as appropriate with the patient. See HPI as well for other ROS.  ROS  Medical History: Past Medical History: Diagnosis Date GERD (gastroesophageal reflux disease)  There is no problem list on file for this patient.  Past Surgical History: Procedure Laterality Date TONSILLECTOMY & ADENOIDECTOMY 1991 LAPAROSCOPIC TUBAL LIGATION 2005 nasal septoplasty 03/18/2018 cystoscopy with retrograde pyelogram/ uteroscopy with stent placement 02/12/2021 CYSTOSCOPY W/ URETEROSCOPY W/ LITHOTRIPSY 03/10/2021   Allergies Allergen Reactions Amoxicillin Shortness Of Breath Penicillins Shortness Of Breath Duloxetine Other (See Comments) Suicidal thoughts Hydrocodone Itching  Current Outpatient Medications on File Prior to Visit Medication Sig Dispense Refill diphenoxylate-atropine (LOMOTIL) 2.5-0.025 mg tablet Take by mouth potassium chloride 20 mEq/15 mL oral solution Take by mouth acetaminophen (TYLENOL) 500 MG tablet Take by mouth  No current facility-administered medications on file prior to visit.  Family History Problem Relation Age of Onset High blood pressure (Hypertension) Mother Hyperlipidemia (Elevated cholesterol) Mother Coronary Artery Disease  (Blocked arteries around heart) Mother Diabetes Mother Deep vein thrombosis (DVT or abnormal blood clot formation) Maternal Grandmother Ovarian cancer Maternal Grandmother   Social History  Tobacco Use Smoking Status Never Smokeless Tobacco Never   Social History  Socioeconomic History Marital status: Single Tobacco Use Smoking status: Never Smokeless tobacco: Never Substance and Sexual Activity Alcohol use: Not Currently Drug use: Never  Objective:  Vitals:  BP: 105/72 Pulse: (!) 127 Temp: 36.7 C (98 F) SpO2: 98% Weight: 71.7 kg (158 lb) Height: 170.2 cm (5\' 7" )  Body mass index is 24.75 kg/m.  Physical Exam  On exam, her left breast mass is no longer palpable and there is no axillary adenopathy  Labs, Imaging and Diagnostic Testing:  I reviewed her MRI  Assessment and Plan:  Diagnoses and all orders for this visit:  Invasive ductal carcinoma of breast, left (CMS-HCC)    She has had a complete response with neoadjuvant chemotherapy. We next discussed proceeding with a radioactive seed guided left breast lumpectomy and sentinel lymph node biopsy as she is still interested in breast conservation.  I explained the surgical procedure in detail. We discussed the risks which includes but is not limited to bleeding, infection, the need for further surgery if lymph nodes are positive or malignancy is still found in the breast with positive margins, cardiopulmonary issues with surgery, chronic lymphedema, seroma formation, injury to surrounding structures, stop her recovery, etc. She understands and wished to proceed with surgery which will be scheduled.   Addendum: Radiology has reviewed the films preoperatively and although the area of fibrocystic changes was felt to be benign, excision of that secondary in the left breast was also recommended.  Orders have been placed for the second seed and we will proceed with 2 separate lumpectomies and the left sentinel lymph  node biopsy

## 2022-11-07 ENCOUNTER — Encounter (HOSPITAL_COMMUNITY)
Admission: RE | Disposition: A | Discharge status: Home / Self Care | Payer: Self-pay | Source: Home / Self Care | Attending: Surgery

## 2022-11-07 ENCOUNTER — Ambulatory Visit (HOSPITAL_BASED_OUTPATIENT_CLINIC_OR_DEPARTMENT_OTHER): Payer: 59 | Admitting: Anesthesiology

## 2022-11-07 ENCOUNTER — Ambulatory Visit: Payer: Self-pay

## 2022-11-07 ENCOUNTER — Other Ambulatory Visit: Payer: Self-pay

## 2022-11-07 ENCOUNTER — Ambulatory Visit
Admission: RE | Admit: 2022-11-07 | Discharge: 2022-11-07 | Disposition: A | Discharge status: Home / Self Care | Payer: 59 | Source: Ambulatory Visit | Attending: Surgery | Admitting: Surgery

## 2022-11-07 ENCOUNTER — Ambulatory Visit (HOSPITAL_COMMUNITY)
Admission: RE | Admit: 2022-11-07 | Discharge: 2022-11-07 | Disposition: A | Discharge status: Home / Self Care | Payer: 59 | Attending: Surgery | Admitting: Surgery

## 2022-11-07 ENCOUNTER — Ambulatory Visit (HOSPITAL_COMMUNITY): Payer: 59 | Admitting: Physician Assistant

## 2022-11-07 ENCOUNTER — Ambulatory Visit: Payer: Self-pay | Admitting: Hematology and Oncology

## 2022-11-07 ENCOUNTER — Encounter (HOSPITAL_COMMUNITY): Payer: Self-pay | Admitting: Surgery

## 2022-11-07 DIAGNOSIS — Z9221 Personal history of antineoplastic chemotherapy: Secondary | ICD-10-CM | POA: Insufficient documentation

## 2022-11-07 DIAGNOSIS — Z483 Aftercare following surgery for neoplasm: Secondary | ICD-10-CM | POA: Diagnosis not present

## 2022-11-07 DIAGNOSIS — Z853 Personal history of malignant neoplasm of breast: Secondary | ICD-10-CM

## 2022-11-07 DIAGNOSIS — M797 Fibromyalgia: Secondary | ICD-10-CM

## 2022-11-07 DIAGNOSIS — G8918 Other acute postprocedural pain: Secondary | ICD-10-CM | POA: Diagnosis not present

## 2022-11-07 DIAGNOSIS — Z170 Estrogen receptor positive status [ER+]: Secondary | ICD-10-CM | POA: Insufficient documentation

## 2022-11-07 DIAGNOSIS — D649 Anemia, unspecified: Secondary | ICD-10-CM | POA: Diagnosis not present

## 2022-11-07 DIAGNOSIS — K219 Gastro-esophageal reflux disease without esophagitis: Secondary | ICD-10-CM | POA: Diagnosis not present

## 2022-11-07 DIAGNOSIS — C50912 Malignant neoplasm of unspecified site of left female breast: Secondary | ICD-10-CM | POA: Diagnosis not present

## 2022-11-07 DIAGNOSIS — G709 Myoneural disorder, unspecified: Secondary | ICD-10-CM

## 2022-11-07 HISTORY — PX: BREAST LUMPECTOMY WITH RADIOACTIVE SEED AND SENTINEL LYMPH NODE BIOPSY: SHX6550

## 2022-11-07 SURGERY — BREAST LUMPECTOMY WITH RADIOACTIVE SEED AND SENTINEL LYMPH NODE BIOPSY
Anesthesia: General | Site: Breast | Laterality: Left

## 2022-11-07 MED ORDER — FENTANYL CITRATE PF 50 MCG/ML IJ SOSY
50.0000 ug | PREFILLED_SYRINGE | Freq: Once | INTRAMUSCULAR | Status: AC
  Filled 2022-11-07: qty 1

## 2022-11-07 MED ORDER — FENTANYL CITRATE (PF) 250 MCG/5ML IJ SOLN
INTRAMUSCULAR | Status: DC | PRN
  Administered 2022-11-07: 100 ug via INTRAVENOUS

## 2022-11-07 MED ORDER — BUPIVACAINE HCL 0.25 % IJ SOLN
INTRAMUSCULAR | Status: DC | PRN
  Administered 2022-11-07: 13 mL

## 2022-11-07 MED ORDER — CHLORHEXIDINE GLUCONATE 0.12 % MT SOLN: 15.0000 mL | Freq: Once | OROMUCOSAL | Status: AC

## 2022-11-07 MED ORDER — MAGTRACE LYMPHATIC TRACER
INTRAMUSCULAR | Status: DC | PRN
  Administered 2022-11-07: 2 mL via INTRAMUSCULAR

## 2022-11-07 MED ORDER — MIDAZOLAM HCL 2 MG/2ML IJ SOLN
INTRAMUSCULAR | Status: AC
  Administered 2022-11-07: 1 mg via INTRAVENOUS
  Filled 2022-11-07: qty 2

## 2022-11-07 MED ORDER — ONDANSETRON HCL 4 MG/2ML IJ SOLN
INTRAMUSCULAR | Status: AC
  Filled 2022-11-07: qty 2

## 2022-11-07 MED ORDER — EPHEDRINE 5 MG/ML INJ
INTRAVENOUS | Status: AC
  Filled 2022-11-07: qty 5

## 2022-11-07 MED ORDER — EPHEDRINE SULFATE-NACL 50-0.9 MG/10ML-% IV SOSY
PREFILLED_SYRINGE | INTRAVENOUS | Status: DC | PRN
  Administered 2022-11-07: 5 mg via INTRAVENOUS

## 2022-11-07 MED ORDER — LIDOCAINE 2% (20 MG/ML) 5 ML SYRINGE
INTRAMUSCULAR | Status: DC | PRN
  Administered 2022-11-07: 80 mg via INTRAVENOUS

## 2022-11-07 MED ORDER — DEXAMETHASONE SODIUM PHOSPHATE 10 MG/ML IJ SOLN
INTRAMUSCULAR | Status: DC | PRN
  Administered 2022-11-07: 10 mg via INTRAVENOUS

## 2022-11-07 MED ORDER — BUPIVACAINE HCL (PF) 0.25 % IJ SOLN
INTRAMUSCULAR | Status: AC
  Filled 2022-11-07: qty 30

## 2022-11-07 MED ORDER — PHENYLEPHRINE 80 MCG/ML (10ML) SYRINGE FOR IV PUSH (FOR BLOOD PRESSURE SUPPORT)
PREFILLED_SYRINGE | INTRAVENOUS | Status: AC
  Filled 2022-11-07: qty 10

## 2022-11-07 MED ORDER — PHENYLEPHRINE HCL-NACL 20-0.9 MG/250ML-% IV SOLN
INTRAVENOUS | Status: DC | PRN
  Administered 2022-11-07: 25 ug/min via INTRAVENOUS

## 2022-11-07 MED ORDER — LIDOCAINE 2% (20 MG/ML) 5 ML SYRINGE
INTRAMUSCULAR | Status: AC
  Filled 2022-11-07: qty 5

## 2022-11-07 MED ORDER — VANCOMYCIN HCL IN DEXTROSE 1-5 GM/200ML-% IV SOLN: 1000.0000 mg | INTRAVENOUS | Status: AC

## 2022-11-07 MED ORDER — FENTANYL CITRATE (PF) 100 MCG/2ML IJ SOLN
INTRAMUSCULAR | Status: AC
  Administered 2022-11-07: 50 ug
  Filled 2022-11-07: qty 2

## 2022-11-07 MED ORDER — CHLORHEXIDINE GLUCONATE 0.12 % MT SOLN
OROMUCOSAL | Status: AC
  Administered 2022-11-07: 15 mL via OROMUCOSAL
  Filled 2022-11-07: qty 15

## 2022-11-07 MED ORDER — 0.9 % SODIUM CHLORIDE (POUR BTL) OPTIME
TOPICAL | Status: DC | PRN
  Administered 2022-11-07: 1000 mL

## 2022-11-07 MED ORDER — VANCOMYCIN HCL IN DEXTROSE 1-5 GM/200ML-% IV SOLN
INTRAVENOUS | Status: AC
  Administered 2022-11-07: 1000 mg via INTRAVENOUS
  Filled 2022-11-07: qty 200

## 2022-11-07 MED ORDER — LACTATED RINGERS IV SOLN: INTRAVENOUS | Status: DC

## 2022-11-07 MED ORDER — MIDAZOLAM HCL 2 MG/2ML IJ SOLN
INTRAMUSCULAR | Status: AC
  Filled 2022-11-07: qty 2

## 2022-11-07 MED ORDER — ORAL CARE MOUTH RINSE: 15.0000 mL | Freq: Once | OROMUCOSAL | Status: AC

## 2022-11-07 MED ORDER — MIDAZOLAM HCL 2 MG/2ML IJ SOLN
1.0000 mg | Freq: Once | INTRAMUSCULAR | Status: AC
  Filled 2022-11-07: qty 1

## 2022-11-07 MED ORDER — FENTANYL CITRATE (PF) 250 MCG/5ML IJ SOLN
INTRAMUSCULAR | Status: AC
  Filled 2022-11-07: qty 5

## 2022-11-07 MED ORDER — BUPIVACAINE LIPOSOME 1.3 % IJ SUSP
INTRAMUSCULAR | Status: DC | PRN
  Administered 2022-11-07: 10 mL via PERINEURAL

## 2022-11-07 MED ORDER — DEXAMETHASONE SODIUM PHOSPHATE 10 MG/ML IJ SOLN
INTRAMUSCULAR | Status: AC
  Filled 2022-11-07: qty 1

## 2022-11-07 MED ORDER — TRAMADOL HCL 50 MG PO TABS: 50.0000 mg | ORAL_TABLET | Freq: Four times a day (QID) | ORAL | 0 refills | Status: DC | PRN

## 2022-11-07 MED ORDER — OXYCODONE HCL 5 MG PO TABS
ORAL_TABLET | ORAL | Status: AC
  Filled 2022-11-07: qty 1

## 2022-11-07 MED ORDER — ACETAMINOPHEN 500 MG PO TABS
ORAL_TABLET | ORAL | Status: AC
  Administered 2022-11-07: 1000 mg via ORAL
  Filled 2022-11-07: qty 2

## 2022-11-07 MED ORDER — PROPOFOL 10 MG/ML IV BOLUS
INTRAVENOUS | Status: AC
  Filled 2022-11-07: qty 20

## 2022-11-07 MED ORDER — PROPOFOL 10 MG/ML IV BOLUS
INTRAVENOUS | Status: DC | PRN
  Administered 2022-11-07: 200 mg via INTRAVENOUS
  Administered 2022-11-07: 100 mg via INTRAVENOUS

## 2022-11-07 MED ORDER — FENTANYL CITRATE (PF) 100 MCG/2ML IJ SOLN
25.0000 ug | INTRAMUSCULAR | Status: DC | PRN
  Administered 2022-11-07 (×2): 50 ug via INTRAVENOUS

## 2022-11-07 MED ORDER — ONDANSETRON HCL 4 MG/2ML IJ SOLN
INTRAMUSCULAR | Status: DC | PRN
  Administered 2022-11-07: 4 mg via INTRAVENOUS

## 2022-11-07 MED ORDER — MIDAZOLAM HCL 2 MG/2ML IJ SOLN
INTRAMUSCULAR | Status: DC | PRN
  Administered 2022-11-07: 1 mg via INTRAVENOUS

## 2022-11-07 MED ORDER — ENSURE PRE-SURGERY PO LIQD: 296.0000 mL | Freq: Once | ORAL | Status: DC

## 2022-11-07 MED ORDER — OXYCODONE HCL 5 MG PO TABS
5.0000 mg | ORAL_TABLET | Freq: Once | ORAL | Status: AC | PRN
  Administered 2022-11-07: 5 mg via ORAL

## 2022-11-07 MED ORDER — FENTANYL CITRATE (PF) 100 MCG/2ML IJ SOLN
INTRAMUSCULAR | Status: AC
  Filled 2022-11-07: qty 2

## 2022-11-07 MED ORDER — CHLORHEXIDINE GLUCONATE CLOTH 2 % EX PADS: 6.0000 | MEDICATED_PAD | Freq: Once | CUTANEOUS | Status: DC

## 2022-11-07 MED ORDER — OXYCODONE HCL 5 MG/5ML PO SOLN: 5.0000 mg | Freq: Once | ORAL | Status: AC | PRN

## 2022-11-07 MED ORDER — ONDANSETRON HCL 4 MG/2ML IJ SOLN: 4.0000 mg | Freq: Once | INTRAMUSCULAR | Status: DC | PRN

## 2022-11-07 MED ORDER — BUPIVACAINE HCL (PF) 0.5 % IJ SOLN
INTRAMUSCULAR | Status: DC | PRN
  Administered 2022-11-07: 20 mL via PERINEURAL

## 2022-11-07 MED ORDER — ACETAMINOPHEN 500 MG PO TABS: 1000.0000 mg | ORAL_TABLET | ORAL | Status: AC

## 2022-11-07 SURGICAL SUPPLY — 39 items
ADH SKN CLS APL DERMABOND .7 (GAUZE/BANDAGES/DRESSINGS) ×1
APL PRP STRL LF DISP 70% ISPRP (MISCELLANEOUS) ×1
APPLIER CLIP 9.375 MED OPEN (MISCELLANEOUS) ×1
APR CLP MED 9.3 20 MLT OPN (MISCELLANEOUS) ×1
BAG COUNTER SPONGE SURGICOUNT (BAG) ×1 IMPLANT
BAG SPNG CNTER NS LX DISP (BAG) ×1
CANISTER SUCT 3000ML PPV (MISCELLANEOUS) ×1 IMPLANT
CHLORAPREP W/TINT 26 (MISCELLANEOUS) ×1 IMPLANT
CLIP APPLIE 9.375 MED OPEN (MISCELLANEOUS) ×1 IMPLANT
CNTNR URN SCR LID CUP LEK RST (MISCELLANEOUS) IMPLANT
CONT SPEC 4OZ STRL OR WHT (MISCELLANEOUS) ×1
COVER PROBE W GEL 5X96 (DRAPES) ×2 IMPLANT
COVER SURGICAL LIGHT HANDLE (MISCELLANEOUS) ×1 IMPLANT
DERMABOND ADVANCED .7 DNX12 (GAUZE/BANDAGES/DRESSINGS) ×1 IMPLANT
DEVICE DUBIN SPECIMEN MAMMOGRA (MISCELLANEOUS) ×1 IMPLANT
DRAPE CHEST BREAST 15X10 FENES (DRAPES) ×1 IMPLANT
ELECT CAUTERY BLADE 6.4 (BLADE) ×1 IMPLANT
ELECT REM PT RETURN 9FT ADLT (ELECTROSURGICAL) ×1
ELECTRODE REM PT RTRN 9FT ADLT (ELECTROSURGICAL) ×1 IMPLANT
GLOVE SURG SIGNA 7.5 PF LTX (GLOVE) ×1 IMPLANT
GOWN STRL REUS W/ TWL LRG LVL3 (GOWN DISPOSABLE) ×1 IMPLANT
GOWN STRL REUS W/ TWL XL LVL3 (GOWN DISPOSABLE) ×1 IMPLANT
GOWN STRL REUS W/TWL LRG LVL3 (GOWN DISPOSABLE) ×1
GOWN STRL REUS W/TWL XL LVL3 (GOWN DISPOSABLE) ×1
KIT BASIN OR (CUSTOM PROCEDURE TRAY) ×1 IMPLANT
KIT MARKER MARGIN INK (KITS) ×1 IMPLANT
NDL FILTER BLUNT 18X1 1/2 (NEEDLE) IMPLANT
NDL HYPO 25GX1X1/2 BEV (NEEDLE) ×1 IMPLANT
NEEDLE FILTER BLUNT 18X1 1/2 (NEEDLE) ×1 IMPLANT
NEEDLE HYPO 25GX1X1/2 BEV (NEEDLE) ×1 IMPLANT
NS IRRIG 1000ML POUR BTL (IV SOLUTION) ×1 IMPLANT
PACK GENERAL/GYN (CUSTOM PROCEDURE TRAY) ×1 IMPLANT
SUT MNCRL AB 4-0 PS2 18 (SUTURE) ×1 IMPLANT
SUT VIC AB 3-0 SH 18 (SUTURE) ×1 IMPLANT
SYR 3ML LL SCALE MARK (SYRINGE) IMPLANT
SYR CONTROL 10ML LL (SYRINGE) ×1 IMPLANT
TOWEL GREEN STERILE (TOWEL DISPOSABLE) ×1 IMPLANT
TOWEL GREEN STERILE FF (TOWEL DISPOSABLE) ×1 IMPLANT
TRACER MAGTRACE VIAL (MISCELLANEOUS) IMPLANT

## 2022-11-07 NOTE — Anesthesia Procedure Notes (Signed)
Anesthesia Regional Block: Pectoralis block   Pre-Anesthetic Checklist: , timeout performed,  Correct Patient, Correct Site, Correct Laterality,  Correct Procedure, Correct Position, site marked,  Risks and benefits discussed,  Surgical consent,  Pre-op evaluation,  At surgeon's request and post-op pain management  Laterality: Left  Prep: chloraprep       Needles:  Injection technique: Single-shot  Needle Type: Echogenic Stimulator Needle     Needle Length: 10cm  Needle Gauge: 21   Needle insertion depth: 7 cm   Additional Needles:   Procedures:,,,, ultrasound used (permanent image in chart),,    Narrative:  Start time: 11/07/2022 3:03 PM End time: 11/07/2022 3:08 PM Injection made incrementally with aspirations every 5 mL.  Performed by: Personally  Anesthesiologist: Mal Amabile, MD  Additional Notes: Timeout performed. Patient sedated. Relevant anatomy ID'd using [place]. Incremental 2-33ml injection of LA with frequent aspiration. Patient tolerated procedure well.

## 2022-11-07 NOTE — Anesthesia Procedure Notes (Addendum)
Procedure Name: LMA Insertion Date/Time: 11/07/2022 4:04 PM  Performed by: Sharyn Dross, CRNAPre-anesthesia Checklist: Patient identified, Emergency Drugs available, Suction available and Patient being monitored Patient Re-evaluated:Patient Re-evaluated prior to induction Oxygen Delivery Method: Circle system utilized Preoxygenation: Pre-oxygenation with 100% oxygen Induction Type: IV induction Ventilation: Mask ventilation without difficulty LMA: LMA inserted LMA Size: 4.0 Tube type: Oral Number of attempts: 1 Placement Confirmation: positive ETCO2 and breath sounds checked- equal and bilateral Tube secured with: Tape Dental Injury: Teeth and Oropharynx as per pre-operative assessment

## 2022-11-07 NOTE — Discharge Instructions (Signed)
[facility] Office Phone Number [phone]  BREAST BIOPSY/ PARTIAL MASTECTOMY: POST OP INSTRUCTIONS  Always review your discharge instruction sheet given to you by the facility where your surgery was performed.  IF YOU HAVE DISABILITY OR FAMILY LEAVE FORMS, YOU MUST BRING THEM TO THE OFFICE FOR PROCESSING.  DO NOT GIVE THEM TO YOUR DOCTOR.  A prescription for pain medication may be given to you upon discharge.  Take your pain medication as prescribed, if needed.  If narcotic pain medicine is not needed, then you may take acetaminophen (Tylenol) or ibuprofen (Advil) as needed. Take your usually prescribed medications unless otherwise directed If you need a refill on your pain medication, please contact your pharmacy.  They will contact our office to request authorization.  Prescriptions will not be filled after 5pm or on week-ends. You should eat very light the first 24 hours after surgery, such as soup, crackers, pudding, etc.  Resume your normal diet the day after surgery. Most patients will experience some swelling and bruising in the breast.  Ice packs and a good support bra will help.  Swelling and bruising can take several days to resolve.  It is common to experience some constipation if taking pain medication after surgery.  Increasing fluid intake and taking a stool softener will usually help or prevent this problem from occurring.  A mild laxative (Milk of Magnesia or Miralax) should be taken according to package directions if there are no bowel movements after 48 hours. Unless discharge instructions indicate otherwise, you may remove your bandages 24-48 hours after surgery, and you may shower at that time.  You may have steri-strips (small skin tapes) in place directly over the incision.  These strips should be left on the skin for 7-10 days.  If your surgeon used skin glue on the incision, you may shower in 24 hours.  The glue will flake off over the next 2-3 weeks.  Any  sutures or staples will be removed at the office during your follow-up visit. ACTIVITIES:  You may resume regular daily activities (gradually increasing) beginning the next day.  Wearing a good support bra or sports bra minimizes pain and swelling.  You may have sexual intercourse when it is comfortable. You may drive when you no longer are taking prescription pain medication, you can comfortably wear a seatbelt, and you can safely maneuver your car and apply brakes. RETURN TO WORK:  ______________________________________________________________________________________ You should see your doctor in the office for a follow-up appointment approximately two weeks after your surgery.  Your doctor's nurse will typically make your follow-up appointment when she calls you with your pathology report.  Expect your pathology report 2-3 business days after your surgery.  You may call to check if you do not hear from us after three days. OTHER INSTRUCTIONS: YOU MAY REMOVE THE BINDER AND SHOWER STARTING TOMORROW THEN PUT THE BINDER BACK ON _ICE PACK, TYLENOL, AND IBUPROFEN ALSO FOR  PAIN NO VIGOROUS ACTIVITY FOR ONE WEEK ______________________________________________________________________________________________ _____________________________________________________________________________________________________________________________________ _____________________________________________________________________________________________________________________________________ _____________________________________________________________________________________________________________________________________  WHEN TO CALL YOUR DOCTOR: Fever over 101.0 Nausea and/or vomiting. Extreme swelling or bruising. Continued bleeding from incision. Increased pain, redness, or drainage from the incision.  The clinic staff is available to answer your questions during regular business hours.  Please don't hesitate to call  and ask to speak to one of the nurses for clinical concerns.  If you have a medical emergency, go to the nearest emergency room or call 911.  A surgeon from [facility] is always   on call at the hospital.  For further questions, please visit [email or web address]  

## 2022-11-07 NOTE — Anesthesia Preprocedure Evaluation (Addendum)
Anesthesia Evaluation  Patient identified by MRN, date of birth, ID band Patient awake    Reviewed: Allergy & Precautions, NPO status , Patient's Chart, lab work & pertinent test results  History of Anesthesia Complications (+) Family history of anesthesia reaction  Airway Mallampati: II       Dental no notable dental hx. (+) Dental Advisory Given, Chipped,    Pulmonary shortness of breath and with exertion   Pulmonary exam normal breath sounds clear to auscultation       Cardiovascular negative cardio ROS Normal cardiovascular exam Rhythm:Regular Rate:Normal     Neuro/Psych  Headaches  Neuromuscular disease  negative psych ROS   GI/Hepatic Neg liver ROS,GERD  Medicated,,Hx/o Schatzki ring   Endo/Other  Left breast Ca  Renal/GU Renal InsufficiencyRenal diseaseHx/o renal calculi  negative genitourinary   Musculoskeletal  (+)  Fibromyalgia -  Abdominal   Peds  Hematology  (+) Blood dyscrasia, anemia   Anesthesia Other Findings   Reproductive/Obstetrics negative OB ROS                             Anesthesia Physical Anesthesia Plan  ASA: 2  Anesthesia Plan: General   Post-op Pain Management: Minimal or no pain anticipated, Regional block*, Precedex and Tylenol PO (pre-op)*   Induction: Intravenous  PONV Risk Score and Plan: Treatment may vary due to age or medical condition, Midazolam, Ondansetron and Dexamethasone  Airway Management Planned: LMA  Additional Equipment: None  Intra-op Plan:   Post-operative Plan: Extubation in OR  Informed Consent: I have reviewed the patients History and Physical, chart, labs and discussed the procedure including the risks, benefits and alternatives for the proposed anesthesia with the patient or authorized representative who has indicated his/her understanding and acceptance.     Dental advisory given  Plan Discussed with: CRNA and  Anesthesiologist  Anesthesia Plan Comments:        Anesthesia Quick Evaluation

## 2022-11-07 NOTE — Transfer of Care (Signed)
Immediate Anesthesia Transfer of Care Note  Patient: [name] [name]  Procedure(s) Performed: LEFT BREAST LUMPECTOMY WITH RADIOACTIVE SEEDX2 AND SENTINEL LYMPH NODE BIOPSY (Left: Breast)  Patient Location: PACU  Anesthesia Type:General  Level of Consciousness: awake, alert , and oriented  Airway & Oxygen Therapy: Patient Spontanous Breathing  Post-op Assessment: Report given to RN and Post -op Vital signs reviewed and stable  Post vital signs: Reviewed and stable  Last Vitals:  Vitals Value Taken Time  BP 117/75 11/07/22 1710  Temp    Pulse 79 11/07/22 1713  Resp 21 11/07/22 1713  SpO2 98 % 11/07/22 1713  Vitals shown include unvalidated device data.  Last Pain:  Vitals:   11/07/22 1515  TempSrc:   PainSc: 0-No pain      Patients Stated Pain Goal: 0 (11/07/22 1414)  Complications: There were no known notable events for this encounter.

## 2022-11-07 NOTE — Op Note (Signed)
   [name] [name] 11/07/2022   Pre-op Diagnosis: LEFT BREAST CANCER     Post-op Diagnosis: same  Procedure(s): RADIOACTIVE SEED GUIDED LEFT BREAST LUMPECTOMY WITH 2 SEEDS DEEP LEFT AXILLARY SENTINEL LYMPH NODE BIOPSY INJECTION OF MAG TRACE FOR LYMPH NODE MAPPING   Surgeon(s): Abigail Miyamoto, MD  Anesthesia: General  Staff:  Circulator: Virgel Bouquet, RN Scrub Person: Lake Bells R  Estimated Blood Loss: Minimal               Specimens: sent to path  Indications: This is a 47 year old female presented with a large mass in her left breast.  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  She then had an MRI showing 2 other areas which were biopsied.  Both were benign but 1 was felt to be discordant.  She underwent neoadjuvant chemotherapy and follow-up MRI has showed complete resolution of all areas of concern in the breast.  The decision was made to proceed with a radioactive seed guided left breast lumpectomy to remove the site of the original cancer in the suspicious area and do a sentinel node biopsy as well  Procedure: The patient was brought to the operating identified as a correct patient.  She was placed upon the operating table and general anesthesia was induced.  I injected back trace underneath the left nipple and massaged the breast.  Her left breast and axilla were then prepped and draped in usual sterile fashion.  Using the neoprobe I located the 2 radioactive seeds which were in the lower outer quadrant of the left breast.  I anesthetized skin with Marcaine and made incision with a scalpel.  I then performed a wide lumpectomy including both seeds with 1 lumpectomy specimen.  I was able to dissect medial and lateral to the signals from the seeds and then superior and anterior and then took the dissection down toward near the chest wall.  I then completed lumpectomy staying underneath all signals with the neoprobe.  An x-ray was performed of the  lumpectomy specimen confirming that both radioactive seeds in both biopsy clips were in the specimen.  The specimen was then sent to pathology for evaluation. Using the mag trace probe I then identified an area of increased uptake in the left axilla.  I anesthetized skin with Marcaine and made incision with a scalpel.  I then dissected down to the deep left axillary tissue.  Identified several sentinel lymph nodes that had increased uptake of MAC trace.  They were normal in size.  I excised these together in 1 specimen and sent them to pathology for evaluation.  I achieved hemostasis with surgical clips.  No further uptake was identified in the axilla and there were no large palpable lymph nodes identified.  I placed surgical clips around the periphery of the lumpectomy cavity for marking purposes.  I then closed both incisions with interrupted 3-0 Vicryl sutures and 4-0 Monocryl sutures.  Dermabond was then applied.  The patient tolerated the procedure well.  All the counts were correct at the end of the procedure.  The patient was then placed in a breast binder.  She was then extubated in the operating room and taken in a stable condition to the recovery room.          Abigail Miyamoto   Date: 11/07/2022  Time: 4:57 PM

## 2022-11-07 NOTE — Interval H&P Note (Signed)
History and Physical Interval Note: no change in H and p  11/07/2022 2:09 PM  [name] [name]  has presented today for surgery, with the diagnosis of LEFT BREAST CANCER.  The various methods of treatment have been discussed with the patient and family. After consideration of risks, benefits and other options for treatment, the patient has consented to  Procedure(s): LEFT BREAST LUMPECTOMY WITH RADIOACTIVE SEED AND SENTINEL LYMPH NODE BIOPSY (Left) as a surgical intervention.  The patient's history has been reviewed, patient examined, no change in status, stable for surgery.  I have reviewed the patient's chart and labs.  Questions were answered to the patient's satisfaction.     Abigail Miyamoto

## 2022-11-08 ENCOUNTER — Inpatient Hospital Stay: Payer: 59

## 2022-11-08 ENCOUNTER — Inpatient Hospital Stay: Payer: 59 | Admitting: Hematology and Oncology

## 2022-11-08 ENCOUNTER — Telehealth: Payer: Self-pay | Admitting: *Deleted

## 2022-11-08 ENCOUNTER — Encounter (HOSPITAL_COMMUNITY): Payer: Self-pay | Admitting: Surgery

## 2022-11-08 NOTE — Telephone Encounter (Signed)
This RN spoke with pt's mother- [name] [name]- per pt canceled treatment today due to having surgery yesterday and " in pain and having bad head shaking".  This RN asked further about the "head shaking"-   This symptom has been ongoing- prior to surgery - intermittent.  Though [name] [name] didn't have a lot of information- she stated "it looks like when you have an eye twitch but it is her whole head"  Shaking is rhythmic and slight in movement.   Presently [name] [name] is sleeping and this RN was unable to speak with her.  Per discussion this RN did request to have [name] [name] call this RN per above symptom for better clarification.

## 2022-11-08 NOTE — Anesthesia Postprocedure Evaluation (Signed)
Anesthesia Post Note  Patient: [name] [name]  Procedure(s) Performed: LEFT BREAST LUMPECTOMY WITH RADIOACTIVE SEEDX2 AND SENTINEL LYMPH NODE BIOPSY (Left: Breast)     Patient location during evaluation: PACU Anesthesia Type: General Level of consciousness: awake and alert and oriented Pain management: pain level controlled Vital Signs Assessment: post-procedure vital signs reviewed and stable Respiratory status: spontaneous breathing, nonlabored ventilation and respiratory function stable Cardiovascular status: blood pressure returned to baseline and stable Postop Assessment: no apparent nausea or vomiting Anesthetic complications: no   There were no known notable events for this encounter.  Last Vitals:  Vitals:   11/07/22 1725 11/07/22 1740  BP: 121/71 110/70  Pulse: 69 74  Resp: 10 13  Temp:  36.5 C  SpO2: 96% 99%    Last Pain:  Vitals:   11/07/22 1740  TempSrc:   PainSc: 4                  Rhett Najera A.

## 2022-11-09 LAB — SURGICAL PATHOLOGY

## 2022-11-17 ENCOUNTER — Ambulatory Visit: Payer: Self-pay | Admitting: Hematology and Oncology

## 2022-11-17 ENCOUNTER — Other Ambulatory Visit: Payer: Self-pay

## 2022-11-17 ENCOUNTER — Ambulatory Visit: Payer: Self-pay

## 2022-11-20 ENCOUNTER — Other Ambulatory Visit: Payer: Self-pay

## 2022-11-20 ENCOUNTER — Telehealth: Payer: Self-pay | Admitting: *Deleted

## 2022-11-20 ENCOUNTER — Emergency Department (HOSPITAL_COMMUNITY): Payer: 59

## 2022-11-20 ENCOUNTER — Emergency Department (HOSPITAL_COMMUNITY)
Admission: EM | Admit: 2022-11-20 | Discharge: 2022-11-20 | Disposition: A | Discharge status: Home / Self Care | Payer: 59 | Attending: Emergency Medicine | Admitting: Emergency Medicine

## 2022-11-20 DIAGNOSIS — Z853 Personal history of malignant neoplasm of breast: Secondary | ICD-10-CM | POA: Insufficient documentation

## 2022-11-20 DIAGNOSIS — N3001 Acute cystitis with hematuria: Secondary | ICD-10-CM | POA: Insufficient documentation

## 2022-11-20 DIAGNOSIS — R109 Unspecified abdominal pain: Secondary | ICD-10-CM | POA: Diagnosis not present

## 2022-11-20 DIAGNOSIS — Z1152 Encounter for screening for COVID-19: Secondary | ICD-10-CM | POA: Diagnosis not present

## 2022-11-20 LAB — CBC WITH DIFFERENTIAL/PLATELET
Abs Immature Granulocytes: 0.02 K/uL (ref 0.00–0.07)
Basophils Absolute: 0 K/uL (ref 0.0–0.1)
Basophils Relative: 0 %
Eosinophils Absolute: 0 K/uL (ref 0.0–0.5)
Eosinophils Relative: 0 %
HCT: 30.9 % — ABNORMAL LOW (ref 36.0–46.0)
Hemoglobin: 10.9 g/dL — ABNORMAL LOW (ref 12.0–15.0)
Immature Granulocytes: 0 %
Lymphocytes Relative: 9 %
Lymphs Abs: 0.7 K/uL (ref 0.7–4.0)
MCH: 33.2 pg (ref 26.0–34.0)
MCHC: 35.3 g/dL (ref 30.0–36.0)
MCV: 94.2 fL (ref 80.0–100.0)
Monocytes Absolute: 0.5 K/uL (ref 0.1–1.0)
Monocytes Relative: 7 %
Neutro Abs: 5.7 K/uL (ref 1.7–7.7)
Neutrophils Relative %: 84 %
Platelets: 125 K/uL — ABNORMAL LOW (ref 150–400)
RBC: 3.28 MIL/uL — ABNORMAL LOW (ref 3.87–5.11)
RDW: 11.2 % — ABNORMAL LOW (ref 11.5–15.5)
WBC: 6.9 K/uL (ref 4.0–10.5)
nRBC: 0 % (ref 0.0–0.2)

## 2022-11-20 LAB — LACTIC ACID, PLASMA: Lactic Acid, Venous: 1.1 mmol/L (ref 0.5–1.9)

## 2022-11-20 LAB — COMPREHENSIVE METABOLIC PANEL WITH GFR
ALT: 12 U/L (ref 0–44)
AST: 14 U/L — ABNORMAL LOW (ref 15–41)
Albumin: 4 g/dL (ref 3.5–5.0)
Alkaline Phosphatase: 67 U/L (ref 38–126)
Anion gap: 12 (ref 5–15)
BUN: 10 mg/dL (ref 6–20)
CO2: 25 mmol/L (ref 22–32)
Calcium: 9.2 mg/dL (ref 8.9–10.3)
Chloride: 99 mmol/L (ref 98–111)
Creatinine, Ser: 1.19 mg/dL — ABNORMAL HIGH (ref 0.44–1.00)
GFR, Estimated: 57 mL/min — ABNORMAL LOW
Glucose, Bld: 106 mg/dL — ABNORMAL HIGH (ref 70–99)
Potassium: 2.7 mmol/L — CL (ref 3.5–5.1)
Sodium: 136 mmol/L (ref 135–145)
Total Bilirubin: 1 mg/dL (ref 0.3–1.2)
Total Protein: 7.9 g/dL (ref 6.5–8.1)

## 2022-11-20 LAB — RESP PANEL BY RT-PCR (RSV, FLU A&B, COVID)  RVPGX2
Influenza A by PCR: NEGATIVE
Influenza B by PCR: NEGATIVE
Resp Syncytial Virus by PCR: NEGATIVE
SARS Coronavirus 2 by RT PCR: NEGATIVE

## 2022-11-20 LAB — APTT: aPTT: 37 seconds — ABNORMAL HIGH (ref 24–36)

## 2022-11-20 LAB — PROTIME-INR
INR: 1.1 (ref 0.8–1.2)
Prothrombin Time: 14.3 s (ref 11.4–15.2)

## 2022-11-20 LAB — I-STAT BETA HCG BLOOD, ED (MC, WL, AP ONLY): I-stat hCG, quantitative: <5 m[IU]/mL (ref <5)

## 2022-11-20 LAB — URINALYSIS, ROUTINE W REFLEX MICROSCOPIC
Bilirubin Urine: NEGATIVE
Glucose, UA: NEGATIVE mg/dL
Ketones, ur: NEGATIVE mg/dL
Nitrite: NEGATIVE
Protein, ur: 30 mg/dL — AB
Specific Gravity, Urine: 1.012 (ref 1.005–1.030)
pH: 7 (ref 5.0–8.0)

## 2022-11-20 MED ORDER — POTASSIUM CHLORIDE CRYS ER 20 MEQ PO TBCR
40.0000 meq | EXTENDED_RELEASE_TABLET | Freq: Once | ORAL | Status: AC
  Administered 2022-11-20: 40 meq via ORAL
  Filled 2022-11-20: qty 2

## 2022-11-20 MED ORDER — POTASSIUM CHLORIDE 10 MEQ/100ML IV SOLN
10.0000 meq | Freq: Once | INTRAVENOUS | Status: AC
  Administered 2022-11-20: 10 meq via INTRAVENOUS
  Filled 2022-11-20: qty 100

## 2022-11-20 MED ORDER — SODIUM CHLORIDE 0.9 % IV BOLUS
2000.0000 mL | Freq: Once | INTRAVENOUS | Status: AC
  Administered 2022-11-20: 2000 mL via INTRAVENOUS

## 2022-11-20 MED ORDER — ONDANSETRON 4 MG PO TBDP: ORAL_TABLET | ORAL | 0 refills | Status: DC

## 2022-11-20 MED ORDER — SODIUM CHLORIDE 0.9 % IV SOLN
2.0000 g | INTRAVENOUS | Status: DC
  Administered 2022-11-20: 2 g via INTRAVENOUS
  Filled 2022-11-20: qty 20

## 2022-11-20 MED ORDER — ACETAMINOPHEN 325 MG PO TABS
650.0000 mg | ORAL_TABLET | Freq: Once | ORAL | Status: AC
  Administered 2022-11-20: 650 mg via ORAL
  Filled 2022-11-20: qty 2

## 2022-11-20 MED ORDER — POTASSIUM CHLORIDE CRYS ER 20 MEQ PO TBCR: 20.0000 meq | EXTENDED_RELEASE_TABLET | Freq: Every day | ORAL | 0 refills | Status: DC

## 2022-11-20 MED ORDER — SULFAMETHOXAZOLE-TRIMETHOPRIM 800-160 MG PO TABS: 1.0000 | ORAL_TABLET | Freq: Two times a day (BID) | ORAL | 0 refills | Status: AC

## 2022-11-20 NOTE — Telephone Encounter (Signed)
This RN spoke with pt's mother and then called and spoke with the patient.  Pt states starting Saturday evening she developed chills,headache and fever - up to 102.5.  Chills can be intense with " a lot of shaking".  She denies any respiratory issues.  She has not tested for Covid (no test in the home).  Just overall malaise with fever and severe headache.  She is recent post op on 11/07/2022 for left lumpectomy- which patient states surgical site "looks good" denying any redness.  This RN discussed above including pt has not had any chemo since early April - recommended she contact the surgeon's office - and if needed she should proceed to the ER due to ongoing fever.  [name] [name] verbalized understanding of above with agreement to recommendations.

## 2022-11-20 NOTE — Discharge Instructions (Signed)
Follow-up with your doctor later this week.  Drink plenty of fluids and take Tylenol for fever and return if problems

## 2022-11-20 NOTE — ED Provider Notes (Signed)
[place] EMERGENCY DEPARTMENT AT [facility] Provider Note   CSN: [id number] Arrival date & time: 11/20/22  1508     History {Add pertinent medical, surgical, social history, OB history to HPI:1} Chief Complaint  Patient presents with   Fever    [name] [name] is a 47 y.o. female.  Patient complains of fever and back pain.  She has a history of breast cancer   Fever      Home Medications Prior to Admission medications   Medication Sig Start Date End Date Taking? Authorizing Provider  ondansetron (ZOFRAN-ODT) 4 MG disintegrating tablet 4mg  ODT q4 hours prn nausea/vomit 11/20/22  Yes Bethann Berkshire, MD  potassium chloride SA (KLOR-CON M) 20 MEQ tablet Take 1 tablet (20 mEq total) by mouth daily. 11/20/22  Yes Bethann Berkshire, MD  sulfamethoxazole-trimethoprim (BACTRIM DS) 800-160 MG tablet Take 1 tablet by mouth 2 (two) times daily for 7 days. 11/20/22 11/27/22 Yes Bethann Berkshire, MD  cholestyramine (QUESTRAN) 4 g packet TAKE 1 PACKET (4 G TOTAL) BY MOUTH 2 TIMES DAILY. Patient not taking: Reported on 10/17/2022 06/19/22   Loa Socks, NP  diphenoxylate-atropine (LOMOTIL) 2.5-0.025 MG tablet Take 1 tablet by mouth 4 (four) times daily as needed for diarrhea or loose stools. Patient not taking: Reported on 10/17/2022 07/12/22   Rachel Moulds, MD  lidocaine-prilocaine (EMLA) cream Apply 1 Application topically as needed. 10/17/22   Briant Cedar, PA-C  traMADol (ULTRAM) 50 MG tablet Take 1 tablet (50 mg total) by mouth every 6 (six) hours as needed for moderate pain or severe pain. 11/07/22   Abigail Miyamoto, MD      Allergies    Amoxicillin, Coconut (cocos nucifera), Penicillins, Duloxetine, Hydrocodone, Hydrocodone-acetaminophen, and Moxifloxacin    Review of Systems   Review of Systems  Constitutional:  Positive for fever.    Physical Exam Updated Vital Signs BP 113/71   Pulse (!) 107   Temp 100.2 F (37.9 C) (Oral)   Resp (!) 23   Ht 5\' 8"   (1.727 m)   Wt 70.8 kg   LMP 07/27/2022 (Exact Date)   SpO2 98%   BMI 23.72 kg/m  Physical Exam  ED Results / Procedures / Treatments   Labs (all labs ordered are listed, but only abnormal results are displayed) Labs Reviewed  COMPREHENSIVE METABOLIC PANEL - Abnormal; Notable for the following components:      Result Value   Potassium 2.7 (*)    Glucose, Bld 106 (*)    Creatinine, Ser 1.19 (*)    AST 14 (*)    GFR, Estimated 57 (*)    All other components within normal limits  CBC WITH DIFFERENTIAL/PLATELET - Abnormal; Notable for the following components:   RBC 3.28 (*)    Hemoglobin 10.9 (*)    HCT 30.9 (*)    RDW 11.2 (*)    Platelets 125 (*)    All other components within normal limits  APTT - Abnormal; Notable for the following components:   aPTT 37 (*)    All other components within normal limits  URINALYSIS, ROUTINE W REFLEX MICROSCOPIC - Abnormal; Notable for the following components:   APPearance HAZY (*)    Hgb urine dipstick MODERATE (*)    Protein, ur 30 (*)    Leukocytes,Ua TRACE (*)    Bacteria, UA MANY (*)    All other components within normal limits  RESP PANEL BY RT-PCR (RSV, FLU A&B, COVID)  RVPGX2  CULTURE, BLOOD (ROUTINE X 2)  CULTURE, BLOOD (ROUTINE X 2)  URINE CULTURE  LACTIC ACID, PLASMA  PROTIME-INR  LACTIC ACID, PLASMA  I-STAT BETA HCG BLOOD, ED ([facility], [facility], [facility] ONLY)    EKG None  Radiology DG Chest Port 1 View  Result Date: 11/20/2022 CLINICAL DATA:  Questionable sepsis, history of breast cancer EXAM: PORTABLE CHEST 1 VIEW COMPARISON:  12/12/2008 FINDINGS: The heart size and mediastinal contours are within normal limits. Both lungs are clear. Right IJ power port catheter tip upper SVC level. Trachea midline. Postop changes of the left breast and axilla. The visualized skeletal structures are unremarkable. IMPRESSION: No active disease. Electronically Signed   By: Judie Petit.  Shick M.D.   On: 11/20/2022 16:05    Procedures Procedures  {Document  cardiac monitor, telemetry assessment procedure when appropriate:1}  Medications Ordered in ED Medications  cefTRIAXone (ROCEPHIN) 2 g in sodium chloride 0.9 % 100 mL IVPB (0 g Intravenous Stopped 11/20/22 1659)  potassium chloride 10 mEq in 100 mL IVPB (10 mEq Intravenous New Bag/Given 11/20/22 1746)  sodium chloride 0.9 % bolus 2,000 mL (2,000 mLs Intravenous New Bag/Given 11/20/22 1659)  acetaminophen (TYLENOL) tablet 650 mg (650 mg Oral Given 11/20/22 1624)  potassium chloride SA (KLOR-CON M) CR tablet 40 mEq (40 mEq Oral Given 11/20/22 1744)    ED Course/ Medical Decision Making/ A&P   {   Click here for ABCD2, HEART and other calculatorsREFRESH Note before signing :1}                          Medical Decision Making Amount and/or Complexity of Data Reviewed Labs: ordered. Radiology: ordered. ECG/medicine tests: ordered.  Risk OTC drugs. Prescription drug management.   Patient with urinary tract infection.  She is placed on Bactrim and Motrin and will follow-up this week with her doctor  {Document critical care time when appropriate:1} {Document review of labs and clinical decision tools ie heart score, Chads2Vasc2 etc:1}  {Document your independent review of radiology images, and any outside records:1} {Document your discussion with family members, caretakers, and with consultants:1} {Document social determinants of health affecting pt's care:1} {Document your decision making why or why not admission, treatments were needed:1} Final Clinical Impression(s) / ED Diagnoses Final diagnoses:  Acute cystitis with hematuria    Rx / DC Orders ED Discharge Orders          Ordered    sulfamethoxazole-trimethoprim (BACTRIM DS) 800-160 MG tablet  2 times daily        11/20/22 1837    potassium chloride SA (KLOR-CON M) 20 MEQ tablet  Daily        11/20/22 1837    ondansetron (ZOFRAN-ODT) 4 MG disintegrating tablet        11/20/22 1837

## 2022-11-20 NOTE — Sepsis Progress Note (Signed)
Lab called by bedside RN stating they are running lactic acid now(drawn @ 1634)

## 2022-11-20 NOTE — Sepsis Progress Note (Signed)
Elink monitoring for the code sepsis protocol.  

## 2022-11-20 NOTE — ED Triage Notes (Addendum)
Pt to ed c/o fever and back pain x 2 days hx lumpectomy 4/30

## 2022-11-22 LAB — URINE CULTURE: Culture: 60000 — AB

## 2022-11-22 LAB — CULTURE, BLOOD (ROUTINE X 2): Culture: NO GROWTH

## 2022-11-23 ENCOUNTER — Telehealth (HOSPITAL_BASED_OUTPATIENT_CLINIC_OR_DEPARTMENT_OTHER): Payer: Self-pay | Admitting: *Deleted

## 2022-11-23 NOTE — Telephone Encounter (Signed)
Post ED Visit - Positive Culture Follow-up  Culture report reviewed by antimicrobial stewardship pharmacist: [facility] []  Enzo Bi, Pharm.D. []  Celedonio Miyamoto, [address].D., BCPS AQ-ID []  Garvin Fila, Pharm.D., BCPS [x]  Georgina Pillion, Pharm.D., BCPS []  [place], [place].D., BCPS, AAHIVP []  Estella Husk, Pharm.D., BCPS, AAHIVP []  Lysle Pearl, PharmD, BCPS []  Phillips Climes, PharmD, BCPS []  Agapito Games, PharmD, BCPS []  Verlan Friends, PharmD []  Mervyn Gay, PharmD, BCPS []  Vinnie Level, PharmD  [facility] []  Len Childs, PharmD []  Greer Pickerel, PharmD []  Adalberto Cole, PharmD []  Perlie Gold, Rph []  Lonell Face) Jean Rosenthal, PharmD []  Earl Many, PharmD []  Junita Push, PharmD []  Dorna Leitz, PharmD []  Terrilee Files, PharmD []  Lynann Beaver, PharmD []  Keturah Barre, PharmD []  Loralee Pacas, PharmD []  Bernadene Person, PharmD   Positive urine culture Treated with Sulfamethoxazole-Trimethoprim, organism sensitive to the same and no further patient follow-up is required at this time.  Virl Axe [facility] 11/23/2022, 10:50 AM

## 2022-11-25 LAB — CULTURE, BLOOD (ROUTINE X 2)

## 2022-11-27 ENCOUNTER — Other Ambulatory Visit: Payer: Self-pay | Admitting: *Deleted

## 2022-11-27 DIAGNOSIS — Z170 Estrogen receptor positive status [ER+]: Secondary | ICD-10-CM

## 2022-11-28 ENCOUNTER — Inpatient Hospital Stay: Payer: 59 | Attending: Hematology and Oncology

## 2022-11-28 ENCOUNTER — Inpatient Hospital Stay (HOSPITAL_BASED_OUTPATIENT_CLINIC_OR_DEPARTMENT_OTHER): Payer: 59 | Admitting: Adult Health

## 2022-11-28 ENCOUNTER — Encounter: Payer: Self-pay | Admitting: Adult Health

## 2022-11-28 ENCOUNTER — Encounter: Payer: Self-pay | Admitting: Hematology and Oncology

## 2022-11-28 ENCOUNTER — Inpatient Hospital Stay: Payer: 59

## 2022-11-28 VITALS — BP 105/69 | HR 64 | Temp 98.1°F | Resp 18

## 2022-11-28 VITALS — BP 102/65 | HR 79 | Temp 97.5°F | Resp 16 | Ht 68.0 in | Wt 160.2 lb

## 2022-11-28 DIAGNOSIS — Z5112 Encounter for antineoplastic immunotherapy: Secondary | ICD-10-CM | POA: Diagnosis not present

## 2022-11-28 DIAGNOSIS — C50412 Malignant neoplasm of upper-outer quadrant of left female breast: Secondary | ICD-10-CM

## 2022-11-28 DIAGNOSIS — Z170 Estrogen receptor positive status [ER+]: Secondary | ICD-10-CM

## 2022-11-28 DIAGNOSIS — Z5111 Encounter for antineoplastic chemotherapy: Secondary | ICD-10-CM | POA: Insufficient documentation

## 2022-11-28 LAB — CMP (CANCER CENTER ONLY)
ALT: 6 U/L (ref 0–44)
AST: 9 U/L — ABNORMAL LOW (ref 15–41)
Albumin: 3.6 g/dL (ref 3.5–5.0)
Alkaline Phosphatase: 57 U/L (ref 38–126)
Anion gap: 8 (ref 5–15)
BUN: 13 mg/dL (ref 6–20)
CO2: 27 mmol/L (ref 22–32)
Calcium: 8.9 mg/dL (ref 8.9–10.3)
Chloride: 108 mmol/L (ref 98–111)
Creatinine: 1.05 mg/dL — ABNORMAL HIGH (ref 0.44–1.00)
GFR, Estimated: >60 mL/min (ref >60)
Glucose, Bld: 98 mg/dL (ref 70–99)
Potassium: 3.5 mmol/L (ref 3.5–5.1)
Sodium: 143 mmol/L (ref 135–145)
Total Bilirubin: 0.3 mg/dL (ref 0.3–1.2)
Total Protein: 6.3 g/dL — ABNORMAL LOW (ref 6.5–8.1)

## 2022-11-28 LAB — CBC WITH DIFFERENTIAL (CANCER CENTER ONLY)
Abs Immature Granulocytes: 0.01 10*3/uL (ref 0.00–0.07)
Basophils Absolute: 0 10*3/uL (ref 0.0–0.1)
Basophils Relative: 0 %
Eosinophils Absolute: 0 10*3/uL (ref 0.0–0.5)
Eosinophils Relative: 1 %
HCT: 28.7 % — ABNORMAL LOW (ref 36.0–46.0)
Hemoglobin: 10 g/dL — ABNORMAL LOW (ref 12.0–15.0)
Immature Granulocytes: 0 %
Lymphocytes Relative: 34 %
Lymphs Abs: 1 10*3/uL (ref 0.7–4.0)
MCH: 32.7 pg (ref 26.0–34.0)
MCHC: 34.8 g/dL (ref 30.0–36.0)
MCV: 93.8 fL (ref 80.0–100.0)
Monocytes Absolute: 0.2 10*3/uL (ref 0.1–1.0)
Monocytes Relative: 7 %
Neutro Abs: 1.6 10*3/uL — ABNORMAL LOW (ref 1.7–7.7)
Neutrophils Relative %: 58 %
Platelet Count: 206 10*3/uL (ref 150–400)
RBC: 3.06 MIL/uL — ABNORMAL LOW (ref 3.87–5.11)
RDW: 11 % — ABNORMAL LOW (ref 11.5–15.5)
WBC Count: 2.8 10*3/uL — ABNORMAL LOW (ref 4.0–10.5)
nRBC: 0 % (ref 0.0–0.2)

## 2022-11-28 MED ORDER — TRASTUZUMAB-ANNS CHEMO 150 MG IV SOLR
6.0000 mg/kg | Freq: Once | INTRAVENOUS | Status: AC
  Administered 2022-11-28: 420 mg via INTRAVENOUS
  Filled 2022-11-28: qty 20

## 2022-11-28 MED ORDER — SODIUM CHLORIDE 0.9% FLUSH
10.0000 mL | INTRAVENOUS | Status: DC | PRN
  Administered 2022-11-28: 10 mL

## 2022-11-28 MED ORDER — SODIUM CHLORIDE 0.9 % IV SOLN: Freq: Once | INTRAVENOUS | Status: AC

## 2022-11-28 MED ORDER — SODIUM CHLORIDE 0.9 % IV SOLN
420.0000 mg | Freq: Once | INTRAVENOUS | Status: AC
  Administered 2022-11-28: 420 mg via INTRAVENOUS
  Filled 2022-11-28: qty 14

## 2022-11-28 MED ORDER — ACETAMINOPHEN 325 MG PO TABS
650.0000 mg | ORAL_TABLET | Freq: Once | ORAL | Status: AC
  Administered 2022-11-28: 650 mg via ORAL
  Filled 2022-11-28: qty 2

## 2022-11-28 MED ORDER — HEPARIN SOD (PORK) LOCK FLUSH 100 UNIT/ML IV SOLN
500.0000 [IU] | Freq: Once | INTRAVENOUS | Status: AC | PRN
  Administered 2022-11-28: 500 [IU]

## 2022-11-28 MED ORDER — SODIUM CHLORIDE 0.9% FLUSH
10.0000 mL | Freq: Once | INTRAVENOUS | Status: AC | PRN
  Administered 2022-11-28: 10 mL

## 2022-11-28 MED ORDER — DIPHENHYDRAMINE HCL 25 MG PO CAPS
50.0000 mg | ORAL_CAPSULE | Freq: Once | ORAL | Status: AC
  Administered 2022-11-28: 50 mg via ORAL
  Filled 2022-11-28: qty 2

## 2022-11-28 NOTE — Assessment & Plan Note (Signed)
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  She is status post neoadjuvant chemotherapy with Taxotere, carbo, Herceptin, Perjeta x 6 and has undergone left breast lumpectomy demonstrating a complete pathologic response to neoadjuvant chemotherapy.  Left breast stage Ib invasive ductal carcinoma, triple positive: She has completed neoadjuvant chemotherapy and her left breast lumpectomy.  As she has had a complete pathologic response she will now move onto Herceptin Perjeta given every 3 weeks to complete 1 year of maintenance therapy.  I reviewed with her that women who have a complete pathologic response have better outcomes.  We celebrated this. At risk for heart failure due to Herceptin treatment: We are monitoring echocardiograms every 3 months.  Her most recent echo completed in March 2024.  It was normal.  We will repeat echo in June 2024.  Orders placed today. Next steps: She will meet with radiation oncology on May 28 to discuss adjuvant radiation therapy.  She will continue on Herceptin Perjeta every 3 weeks.  I sent scheduling request for this to be scheduled.  Following adjuvant radiation she will start antiestrogen therapy.  [name] [name] will return as indicated above and we will see her back in 3 weeks for her next treatment.

## 2022-11-28 NOTE — Progress Notes (Signed)
[facility] Cancer Follow up:    [name], [name], DO [address] [place] [address]   DIAGNOSIS:  Cancer Staging  Malignant neoplasm of upper-outer quadrant of left breast in female, estrogen receptor positive (HCC) Staging form: Breast, AJCC 8th Edition - Clinical stage from 05/08/2022: Stage IB (cT2, cN0, cM0, G2, ER+, PR+, HER2+) - Signed by Ronny Bacon, PA-C on 05/08/2022 Stage prefix: Initial diagnosis Method of lymph node assessment: Clinical Histologic grading system: 3 grade system - Pathologic stage from 11/07/2022: No Stage Recommended (ypT0, pN0, cM0) - Signed by Loa Socks, NP on 11/28/2022 Stage prefix: Post-therapy   SUMMARY OF ONCOLOGIC HISTORY: Oncology History  Malignant neoplasm of upper-outer quadrant of left breast in female, estrogen receptor positive (HCC)  04/25/2022 Imaging   Patient had diagnostic bilateral mammogram given palpable mass in the left breast.  This showed a 2.6 x 3.5 x 3 cm irregular mass as well as possible satellite mass which is indeterminate.  An ultrasound was recommended.  Ultrasound of the left breast mass showed 3.9 x 2.4 x 2.3 cm irregular mass with an irregular and spiculated margin at 3:00 middle depth 5 cm from the nipple, irregular mass is hypoechoic.  In the left breast at 1 o'clock position, 3 cm from the nipple there is a round mass measuring 0.3 x 0.2 x 0.3 cm.  No significant abnormalities were seen sonographically in the left axilla.  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  Left breast needle core biopsy at 1:00 3 cm from the nipple showed fibrocystic changes with usual ductal hyperplasia negative for malignancy.  Prognostic showed ER 95% PR 40%,  HER2 3+ KI of 50%   05/08/2022 Initial Diagnosis   Malignant neoplasm of upper-outer quadrant of left breast in female, estrogen receptor positive (HCC)   05/08/2022 Cancer Staging   Staging form: Breast, AJCC 8th Edition - Clinical stage from 05/08/2022: Stage IB (cT2, cN0, cM0, G2, ER+, PR+, HER2+) - Signed by Ronny Bacon, PA-C on 05/08/2022 Stage prefix: Initial diagnosis Method of lymph node assessment: Clinical Histologic grading system: 3 grade system   05/18/2022 - 09/16/2022 Chemotherapy   Patient is on Treatment Plan : BREAST  Docetaxel + Carboplatin + Trastuzumab + Pertuzumab  (TCHP) q21d      05/23/2022 Genetic Testing   Negative genetics for [facility] CustomNext-Cancer +RNAinsight Panel.  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 Report date is 05/23/2022.   The CustomNext-Cancer+RNAinsight panel offered by [employer] [facility] includes sequencing and rearrangement analysis for the following 47 genes:  APC, ATM, AXIN2, BARD1, BMPR1A, BRCA1, BRCA2, BRIP1, CDH1, CDK4, CDKN2A, CHEK2, DICER1, EPCAM, GREM1, HOXB13, MEN1, MLH1, MSH2, MSH3, MSH6, MUTYH, NBN, NF1, NF2, NTHL1, PALB2, PMS2, POLD1, POLE, PTEN, RAD51C, RAD51D, RECQL, RET, SDHA, SDHAF2, SDHB, SDHC, SDHD, SMAD4, SMARCA4, STK11, TP53, TSC1, TSC2, and VHL.  RNA data is routinely analyzed for use in variant interpretation for all genes.   10/17/2022 -  Chemotherapy   Patient is on Treatment Plan : BREAST Trastuzumab + Pertuzumab q21d x 11 cycles     11/07/2022 Surgery   Left breast lumpectomy: No residual invasive carcinoma or DCIS.  3 SLN negative for cancer, ypT0,pN0   11/07/2022 Cancer Staging   Staging form: Breast, AJCC  8th Edition - Pathologic stage from 11/07/2022: No Stage Recommended (ypT0, pN0, cM0) - Signed by Loa Socks, NP on 11/28/2022 Stage prefix: Post-therapy     CURRENT THERAPY: Herceptin Perjeta  INTERVAL HISTORY: [name] [name] 47 y.o. 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  Since her last visit she underwent left breast lumpectomy and there was no evidence of residual cancer in the breast or in any lymph nodes.  She is here to continue on Herceptin Perjeta.  She has an appointment scheduled with radiation oncology to discuss adjuvant radiation later this month.  Her most recent echo occurred on 09/29/2022 and demonstrated a normal EF of 60-65%.  She tells me that she is feeling well since completing her surgery.  She has no concerns about the healing of her left breast lumpectomy site.  She tells me her energy level is much improved and she is feeling quite well and excited to move onto the next steps of her cancer treatment.     Patient Active Problem List   Diagnosis Date Noted   Genetic testing 05/30/2022   Family history of breast cancer 05/10/2022   Family history of ovarian cancer 05/10/2022   Malignant neoplasm of upper-outer quadrant of left breast in female, estrogen receptor positive (HCC) 05/08/2022   Food impaction of esophagus    Schatzki's ring    Hx of migraine headaches 11/17/2013   Ureteral calculus 11/17/2013   GERD (gastroesophageal reflux disease) 11/17/2013   Esophageal obstruction due to food impaction 11/17/2013    is allergic to amoxicillin, coconut (cocos nucifera), penicillins, duloxetine, hydrocodone, hydrocodone-acetaminophen, and moxifloxacin.  MEDICAL HISTORY: Past Medical History:  Diagnosis Date   Anemia    history of   Breast cancer, left (HCC)    Dyspnea    even with speaking   Family history of adverse reaction to anesthesia    mother slow to awaken   Family history of breast cancer 05/10/2022   Family history of ovarian cancer 05/10/2022   Fibromyalgia    GERD (gastroesophageal reflux disease)    History of kidney stones    last stone 6 months ago    Migraine    Seasonal allergies    Tinnitus    Vertigo     SURGICAL HISTORY: Past Surgical History:  Procedure Laterality  Date   ADENOIDECTOMY  1991   BREAST BIOPSY  11/06/2022   MM LT RADIOACTIVE SEED LOC MAMMO GUIDE 11/06/2022 [facility]   BREAST BIOPSY  11/06/2022   MM LT RADIOACTIVE SEED EA ADD LESION LOC MAMMO GUIDE 11/06/2022 [facility]   BREAST LUMPECTOMY WITH RADIOACTIVE SEED AND SENTINEL LYMPH NODE BIOPSY Left 11/07/2022   Procedure: LEFT BREAST LUMPECTOMY WITH RADIOACTIVE SEEDX2 AND SENTINEL LYMPH NODE BIOPSY;  Surgeon: Abigail Miyamoto, MD;  Location: [facility] OR;  Service: General;  Laterality: Left;   CYSTOSCOPY WITH RETROGRADE PYELOGRAM, URETEROSCOPY AND STENT PLACEMENT Left 02/22/2021   Procedure: CYSTOSCOPY WITH RETROGRADE PYELOGRAM, URETEROSCOPY AND STENT PLACEMENT;  Surgeon: Belva Agee, MD;  Location: [facility];  Service: Urology;  Laterality: Left;   CYSTOSCOPY/URETEROSCOPY/HOLMIUM LASER/STENT PLACEMENT Left 03/10/2021   Procedure: CYSTOSCOPY RETROGRADE, LEFT URETEROSCOPY/ STONE BASKETRY /HOLMIUM LASER/STENT EXCHANGE;  Surgeon: Crist Fat, MD;  Location: [facility];  Service: Urology;  Laterality: Left;   ESOPHAGOGASTRODUODENOSCOPY N/A 11/17/2013   Procedure: ESOPHAGOGASTRODUODENOSCOPY (EGD);  Surgeon: Rachael Fee, MD;  Location: [facility] ENDOSCOPY;  Service: Endoscopy;  Laterality: N/A;   ESOPHAGOGASTRODUODENOSCOPY N/A 10/15/2015  Procedure: ESOPHAGOGASTRODUODENOSCOPY (EGD);  Surgeon: Iva Boop, MD;  Location: [facility] ENDOSCOPY;  Service: Endoscopy;  Laterality: N/A;   HOLMIUM LASER APPLICATION  03/10/2021   Procedure: HOLMIUM LASER APPLICATION;  Surgeon: Crist Fat, MD;  Location: [facility];  Service: Urology;;   NASAL SEPTOPLASTY W/ TURBINOPLASTY Bilateral 03/18/2018   Procedure: BILATERAL NASAL SEPTOPLASTY WITH TURBINATE REDUCTION;  Surgeon: Flo Shanks, MD;  Location: [facility];  Service: ENT;  Laterality: Bilateral;   PORTACATH PLACEMENT Right 05/17/2022   Procedure: PORT-A-CATH INSERTION WITH ULTRASOUND GUIDANCE;   Surgeon: Abigail Miyamoto, MD;  Location: [facility];  Service: General;  Laterality: Right;   TONSILLECTOMY  1991   TUBAL LIGATION  2005    SOCIAL HISTORY: Social History   Socioeconomic History   Marital status: Single    Spouse name: Not on file   Number of children: Not on file   Years of education: Not on file   Highest education level: Not on file  Occupational History   Not on file  Tobacco Use   Smoking status: Never   Smokeless tobacco: Never  Vaping Use   Vaping Use: Never used  Substance and Sexual Activity   Alcohol use: No   Drug use: No   Sexual activity: Not on file  Other Topics Concern   Not on file  Social History Narrative   ** Merged History Encounter **       Social Determinants of Health   Financial Resource Strain: Low Risk  (05/10/2022)   Overall Financial Resource Strain (CARDIA)    Difficulty of Paying Living Expenses: Not very hard  Food Insecurity: No Food Insecurity (05/10/2022)   Hunger Vital Sign    Worried About Running Out of Food in the Last Year: Never true    Ran Out of Food in the Last Year: Never true  Transportation Needs: No Transportation Needs (05/10/2022)   PRAPARE - Administrator, Civil Service (Medical): No    Lack of Transportation (Non-Medical): No  Physical Activity: Not on file  Stress: Not on file  Social Connections: Not on file  Intimate Partner Violence: Not on file    FAMILY HISTORY: Family History  Problem Relation Age of Onset   Asthma Mother    Diabetes Mother    Colon polyps Mother    Ovarian cancer Maternal Grandmother 23   Diabetes Maternal Grandmother    Esophageal cancer Maternal Grandfather        d. 82   Breast cancer Other        MGM's sisters x2   Cervical cancer Cousin        mat cousin; dx <40    Review of Systems  Constitutional:  Negative for appetite change, chills, fatigue, fever and unexpected weight change.  HENT:   Negative for hearing loss, lump/mass and trouble  swallowing.   Eyes:  Negative for eye problems and icterus.  Respiratory:  Negative for chest tightness, cough and shortness of breath.   Cardiovascular:  Negative for chest pain, leg swelling and palpitations.  Gastrointestinal:  Negative for abdominal distention, abdominal pain, constipation, diarrhea, nausea and vomiting.  Endocrine: Negative for hot flashes.  Genitourinary:  Negative for difficulty urinating.   Musculoskeletal:  Negative for arthralgias.  Skin:  Negative for itching and rash.  Neurological:  Negative for dizziness, extremity weakness, headaches and numbness.  Hematological:  Negative for adenopathy. Does not bruise/bleed easily.  Psychiatric/Behavioral:  Negative for depression. The patient is not nervous/anxious.  PHYSICAL EXAMINATION   Onc Performance Status - 11/28/22 0845       ECOG Perf Status   ECOG Perf Status Restricted in physically strenuous activity but ambulatory and able to carry out work of a light or sedentary nature, e.g., light house work, office work      KPS SCALE   KPS % SCORE Able to carry on normal activity, minor s/s of disease             Vitals:   11/28/22 0842  BP: 102/65  Pulse: 79  Resp: 16  Temp: (!) 97.5 F (36.4 C)    Physical Exam Constitutional:      General: She is not in acute distress.    Appearance: Normal appearance. She is not toxic-appearing.  HENT:     Head: Normocephalic and atraumatic.  Eyes:     General: No scleral icterus. Cardiovascular:     Rate and Rhythm: Normal rate and regular rhythm.     Pulses: Normal pulses.     Heart sounds: Normal heart sounds.  Pulmonary:     Effort: Pulmonary effort is normal.     Breath sounds: Normal breath sounds.  Abdominal:     General: Abdomen is flat. Bowel sounds are normal. There is no distension.     Palpations: Abdomen is soft.     Tenderness: There is no abdominal tenderness.  Musculoskeletal:        General: No swelling.     Cervical back:  Neck supple.  Lymphadenopathy:     Cervical: No cervical adenopathy.  Skin:    General: Skin is warm and dry.     Findings: No rash.  Neurological:     General: No focal deficit present.     Mental Status: She is alert.  Psychiatric:        Mood and Affect: Mood normal.        Behavior: Behavior normal.     LABORATORY DATA:  CBC    Component Value Date/Time   WBC 2.8 (L) 11/28/2022 0826   WBC 6.9 11/20/2022 1624   RBC 3.06 (L) 11/28/2022 0826   HGB 10.0 (L) 11/28/2022 0826   HCT 28.7 (L) 11/28/2022 0826   PLT 206 11/28/2022 0826   MCV 93.8 11/28/2022 0826   MCH 32.7 11/28/2022 0826   MCHC 34.8 11/28/2022 0826   RDW 11.0 (L) 11/28/2022 0826   LYMPHSABS 1.0 11/28/2022 0826   MONOABS 0.2 11/28/2022 0826   EOSABS 0.0 11/28/2022 0826   BASOSABS 0.0 11/28/2022 0826    CMP     Component Value Date/Time   NA 136 11/20/2022 1624   K 2.7 (LL) 11/20/2022 1624   CL 99 11/20/2022 1624   CO2 25 11/20/2022 1624   GLUCOSE 106 (H) 11/20/2022 1624   BUN 10 11/20/2022 1624   CREATININE 1.19 (H) 11/20/2022 1624   CREATININE 1.16 (H) 10/17/2022 0740   CALCIUM 9.2 11/20/2022 1624   PROT 7.9 11/20/2022 1624   ALBUMIN 4.0 11/20/2022 1624   AST 14 (L) 11/20/2022 1624   AST 15 10/17/2022 0740   ALT 12 11/20/2022 1624   ALT 15 10/17/2022 0740   ALKPHOS 67 11/20/2022 1624   BILITOT 1.0 11/20/2022 1624   BILITOT 0.5 10/17/2022 0740   GFRNONAA 57 (L) 11/20/2022 1624   GFRNONAA 59 (L) 10/17/2022 0740   GFRAA >90 11/04/2014 1100         ASSESSMENT and THERAPY PLAN:   Malignant neoplasm of upper-outer quadrant of left breast in  female, estrogen receptor positive (HCC) [name] [name] is a 47 year old woman with 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  She is status post neoadjuvant chemotherapy with Taxotere, carbo, Herceptin, Perjeta x 6 and has undergone left breast lumpectomy demonstrating a complete pathologic response to neoadjuvant  chemotherapy.  Left breast stage Ib invasive ductal carcinoma, triple positive: She has completed neoadjuvant chemotherapy and her left breast lumpectomy.  As she has had a complete pathologic response she will now move onto Herceptin Perjeta given every 3 weeks to complete 1 year of maintenance therapy.  I reviewed with her that women who have a complete pathologic response have better outcomes.  We celebrated this. At risk for heart failure due to Herceptin treatment: We are monitoring echocardiograms every 3 months.  Her most recent echo completed in March 2024.  It was normal.  We will repeat echo in June 2024.  Orders placed today. Next steps: She will meet with radiation oncology on May 28 to discuss adjuvant radiation therapy.  She will continue on Herceptin Perjeta every 3 weeks.  I sent scheduling request for this to be scheduled.  Following adjuvant radiation she will start antiestrogen therapy.  [name] [name] will return as indicated above and we will see her back in 3 weeks for her next treatment.    All questions were answered. The patient knows to call the clinic with any problems, questions or concerns. We can certainly see the patient much sooner if necessary.  Total encounter time:20 minutes*in face-to-face visit time, chart review, lab review, care coordination, order entry, and documentation of the encounter time.    Lillard Anes, NP 11/28/22 9:05 AM Medical Oncology and Hematology [facility] [address] [place], [place] [address] Tel. [phone]    Fax. [phone]  *Total Encounter Time as defined by the Centers for Medicare and Medicaid Services includes, in addition to the face-to-face time of a patient visit (documented in the note above) non-face-to-face time: obtaining and reviewing outside history, ordering and reviewing medications, tests or procedures, care coordination (communications with other health care professionals or caregivers) and  documentation in the medical record.

## 2022-11-28 NOTE — Progress Notes (Signed)
Pt. declines to stay for 30 minute post observation. Vital signs stable, pt. left via ambulation and no shortness of breath noted.

## 2022-11-28 NOTE — Patient Instructions (Signed)
[facility]  Discharge Instructions: Thank you for choosing [facility] to provide your oncology and hematology care.   If you have a lab appointment with the Cancer Center, please go directly to the Cancer Center and check in at the registration area.   Wear comfortable clothing and clothing appropriate for easy access to any Portacath or PICC line.   We strive to give you quality time with your provider. You may need to reschedule your appointment if you arrive late (15 or more minutes).  Arriving late affects you and other patients whose appointments are after yours.  Also, if you miss three or more appointments without notifying the office, you may be dismissed from the clinic at the provider's discretion.      For prescription refill requests, have your pharmacy contact our office and allow 72 hours for refills to be completed.    Today you received the following chemotherapy and/or immunotherapy agents: Trastuzumab (Kanjinti) and Pertuzumab (Perjeta).   To help prevent nausea and vomiting after your treatment, we encourage you to take your nausea medication as directed.  BELOW ARE SYMPTOMS THAT SHOULD BE REPORTED IMMEDIATELY: *FEVER GREATER THAN 100.4 F (38 C) OR HIGHER *CHILLS OR SWEATING *NAUSEA AND VOMITING THAT IS NOT CONTROLLED WITH YOUR NAUSEA MEDICATION *UNUSUAL SHORTNESS OF BREATH *UNUSUAL BRUISING OR BLEEDING *URINARY PROBLEMS (pain or burning when urinating, or frequent urination) *BOWEL PROBLEMS (unusual diarrhea, constipation, pain near the anus) TENDERNESS IN MOUTH AND THROAT WITH OR WITHOUT PRESENCE OF ULCERS (sore throat, sores in mouth, or a toothache) UNUSUAL RASH, SWELLING OR PAIN  UNUSUAL VAGINAL DISCHARGE OR ITCHING   Items with * indicate a potential emergency and should be followed up as soon as possible or go to the Emergency Department if any problems should occur.  Please show the CHEMOTHERAPY ALERT CARD  or IMMUNOTHERAPY ALERT CARD at check-in to the Emergency Department and triage nurse.  Should you have questions after your visit or need to cancel or reschedule your appointment, please contact [facility]  Dept: [phone]  and follow the prompts.  Office hours are 8:00 a.m. to 4:30 p.m. Monday - Friday. Please note that voicemails left after 4:00 p.m. may not be returned until the following business day.  We are closed weekends and major holidays. You have access to a nurse at all times for urgent questions. Please call the main number to the clinic Dept: [phone] and follow the prompts.   For any non-urgent questions, you may also contact your provider using MyChart. We now offer e-Visits for anyone 18 and older to request care online for non-urgent symptoms. For details visit [email or web address].   Also download the MyChart app! Go to the app store, search "MyChart", open the app, select [facility], and log in with your MyChart username and password.  Trastuzumab Injection What is this medication? TRASTUZUMAB (tras TOO zoo mab) treats breast cancer and stomach cancer. It works by blocking a protein that causes cancer cells to grow and multiply. This helps to slow or stop the spread of cancer cells. This medicine may be used for other purposes; ask your health care provider or pharmacist if you have questions. COMMON BRAND NAME(S): Herceptin, Herzuma, KANJINTI, Ogivri, Ontruzant, Trazimera What should I tell my care team before I take this medication? They need to know if you have any of these conditions: Heart failure Lung disease An unusual or allergic reaction to trastuzumab, other   medications, foods, dyes, or preservatives Pregnant or trying to get pregnant Breast-feeding How should I use this medication? This medication is injected into a vein. It is given by your care team in a hospital or clinic setting. Talk to your care team about  the use of this medication in children. It is not approved for use in children. Overdosage: If you think you have taken too much of this medicine contact a poison control center or emergency room at once. NOTE: This medicine is only for you. Do not share this medicine with others. What if I miss a dose? Keep appointments for follow-up doses. It is important not to miss your dose. Call your care team if you are unable to keep an appointment. What may interact with this medication? Certain types of chemotherapy, such as daunorubicin, doxorubicin, epirubicin, idarubicin This list may not describe all possible interactions. Give your health care provider a list of all the medicines, herbs, non-prescription drugs, or dietary supplements you use. Also tell them if you smoke, drink alcohol, or use illegal drugs. Some items may interact with your medicine. What should I watch for while using this medication? Your condition will be monitored carefully while you are receiving this medication. This medication may make you feel generally unwell. This is not uncommon, as chemotherapy affects healthy cells as well as cancer cells. Report any side effects. Continue your course of treatment even though you feel ill unless your care team tells you to stop. This medication may increase your risk of getting an infection. Call your care team for advice if you get a fever, chills, sore throat, or other symptoms of a cold or flu. Do not treat yourself. Try to avoid being around people who are sick. Avoid taking medications that contain aspirin, acetaminophen, ibuprofen, naproxen, or ketoprofen unless instructed by your care team. These medications can hide a fever. Talk to your care team if you may be pregnant. Serious birth defects can occur if you take this medication during pregnancy and for 7 months after the last dose. You will need a negative pregnancy test before starting this medication. Contraception is recommended  while taking this medication and for 7 months after the last dose. Your care team can help you find the option that works for you. Do not breastfeed while taking this medication and for 7 months after stopping treatment. What side effects may I notice from receiving this medication? Side effects that you should report to your care team as soon as possible: Allergic reactions or angioedema--skin rash, itching or hives, swelling of the face, eyes, lips, tongue, arms, or legs, trouble swallowing or breathing Dry cough, shortness of breath or trouble breathing Heart failure--shortness of breath, swelling of the ankles, feet, or hands, sudden weight gain, unusual weakness or fatigue Infection--fever, chills, cough, or sore throat Infusion reactions--chest pain, shortness of breath or trouble breathing, feeling faint or lightheaded Side effects that usually do not require medical attention (report to your care team if they continue or are bothersome): Diarrhea Dizziness Headache Nausea Trouble sleeping Vomiting This list may not describe all possible side effects. Call your doctor for medical advice about side effects. You may report side effects to FDA at 1-800-FDA-1088. Where should I keep my medication? This medication is given in a hospital or clinic. It will not be stored at home. NOTE: This sheet is a summary. It may not cover all possible information. If you have questions about this medicine, talk to your doctor, pharmacist, or health   care provider.  2023 Elsevier/Gold Standard (2021-10-27 00:00:00)  Pertuzumab Injection What is this medication? PERTUZUMAB (per TOOZ ue mab) treats breast cancer. It works by blocking a protein that causes cancer cells to grow and multiply. This helps to slow or stop the spread of cancer cells. It is a monoclonal antibody. This medicine may be used for other purposes; ask your health care provider or pharmacist if you have questions. COMMON BRAND NAME(S):  PERJETA What should I tell my care team before I take this medication? They need to know if you have any of these conditions: Heart failure An unusual or allergic reaction to pertuzumab, other medications, foods, dyes, or preservatives Pregnant or trying to get pregnant Breast-feeding How should I use this medication? This medication is injected into a vein. It is given by your care team in a hospital or clinic setting. Talk to your care team about the use of this medication in children. Special care may be needed. Overdosage: If you think you have taken too much of this medicine contact a poison control center or emergency room at once. NOTE: This medicine is only for you. Do not share this medicine with others. What if I miss a dose? Keep appointments for follow-up doses. It is important not to miss your dose. Call your care team if you are unable to keep an appointment. What may interact with this medication? Interactions are not expected. This list may not describe all possible interactions. Give your health care provider a list of all the medicines, herbs, non-prescription drugs, or dietary supplements you use. Also tell them if you smoke, drink alcohol, or use illegal drugs. Some items may interact with your medicine. What should I watch for while using this medication? Your condition will be monitored carefully while you are receiving this medication. This medication may make you feel generally unwell. This is not uncommon as chemotherapy can affect healthy cells as well as cancer cells. Report any side effects. Continue your course of treatment even though you feel ill unless your care team tells you to stop. Talk to your care team if you may be pregnant. Serious birth defects can occur if you take this medication during pregnancy and for 7 months after the last dose. You will need a negative pregnancy test before starting this medication. Contraception is recommended while taking this  medication and for 7 months after the last dose. Your care team can help you find the option that works for you. Do not breastfeed while taking this medication and for 7 months after the last dose. What side effects may I notice from receiving this medication? Side effects that you should report to your care team as soon as possible: Allergic reactions or angioedema--skin rash, itching or hives, swelling of the face, eyes, lips, tongue, arms, or legs, trouble swallowing or breathing Heart failure--shortness of breath, swelling of the ankles, feet, or hands, sudden weight gain, unusual weakness or fatigue Infusion reactions--chest pain, shortness of breath or trouble breathing, feeling faint or lightheaded Side effects that usually do not require medical attention (report to your care team if they continue or are bothersome): Diarrhea Dry skin Fatigue Hair loss Nausea Vomiting This list may not describe all possible side effects. Call your doctor for medical advice about side effects. You may report side effects to FDA at 1-800-FDA-1088. Where should I keep my medication? This medication is given in a hospital or clinic. It will not be stored at home. NOTE: This sheet is a summary.   It may not cover all possible information. If you have questions about this medicine, talk to your doctor, pharmacist, or health care provider.  2023 Elsevier/Gold Standard (2010-12-20 00:00:00)    

## 2022-11-29 ENCOUNTER — Other Ambulatory Visit: Payer: Self-pay

## 2022-11-29 ENCOUNTER — Telehealth: Payer: Self-pay | Admitting: Hematology and Oncology

## 2022-11-29 ENCOUNTER — Encounter: Payer: Self-pay | Admitting: *Deleted

## 2022-11-29 NOTE — Telephone Encounter (Signed)
Scheduled appointments per WQ. Left voicemail. 

## 2022-12-01 ENCOUNTER — Other Ambulatory Visit: Payer: 59

## 2022-12-01 ENCOUNTER — Other Ambulatory Visit: Payer: Self-pay

## 2022-12-05 ENCOUNTER — Ambulatory Visit
Admission: RE | Admit: 2022-12-05 | Discharge: 2022-12-05 | Disposition: A | Discharge status: Home / Self Care | Payer: 59 | Source: Ambulatory Visit | Attending: Radiation Oncology | Admitting: Radiation Oncology

## 2022-12-05 ENCOUNTER — Encounter: Payer: Self-pay | Admitting: Radiation Oncology

## 2022-12-05 VITALS — BP 111/69 | HR 99 | Temp 97.2°F | Resp 20 | Ht 68.0 in | Wt 157.0 lb

## 2022-12-05 DIAGNOSIS — Z803 Family history of malignant neoplasm of breast: Secondary | ICD-10-CM | POA: Insufficient documentation

## 2022-12-05 DIAGNOSIS — M797 Fibromyalgia: Secondary | ICD-10-CM | POA: Insufficient documentation

## 2022-12-05 DIAGNOSIS — Z8041 Family history of malignant neoplasm of ovary: Secondary | ICD-10-CM | POA: Insufficient documentation

## 2022-12-05 DIAGNOSIS — C50412 Malignant neoplasm of upper-outer quadrant of left female breast: Secondary | ICD-10-CM | POA: Insufficient documentation

## 2022-12-05 DIAGNOSIS — R0602 Shortness of breath: Secondary | ICD-10-CM | POA: Insufficient documentation

## 2022-12-05 DIAGNOSIS — K219 Gastro-esophageal reflux disease without esophagitis: Secondary | ICD-10-CM | POA: Insufficient documentation

## 2022-12-05 DIAGNOSIS — Z170 Estrogen receptor positive status [ER+]: Secondary | ICD-10-CM | POA: Insufficient documentation

## 2022-12-05 DIAGNOSIS — Z87442 Personal history of urinary calculi: Secondary | ICD-10-CM | POA: Insufficient documentation

## 2022-12-05 NOTE — Progress Notes (Signed)
Radiation Oncology         [phone]) [phone] ________________________________  Name: [name] [name]        MRN: [id number]  Date of Service: 12/05/2022 DOB: [birth year]  JX:[id number], [name], DO  Serena Croissant, MD     REFERRING PHYSICIAN: Serena Croissant, MD   DIAGNOSIS: The encounter diagnosis was Malignant neoplasm of upper-outer quadrant of left breast in female, estrogen receptor positive (HCC).   HISTORY OF PRESENT ILLNESS: [name] [name] is a 47 y.o. female originally seen in the multidisciplinary breast clinic for a new diagnosis of left breast cancer. The patient was noted to have a palpable mass in the left breast and proceeded with diagnostic imaging which identified a mass in the left breast in the lower outer quadrant.  There were suspicious pleomorphic calcifications also noted at the time of mammogram.  By ultrasound, a 3.9 cm mass in the 3 o'clock position of the left breast was noted there was also a 3 mm round mass in the 1 o'clock position and heterogeneous shadowing in the 3 o'clock position measuring up to 1.1 cm.  No axillary adenopathy was identified.  Biopsy on 05/02/2022 showed a grade 2 invasive ductal carcinoma with associated intermediate grade DCIS.  The second biopsy in the 1 o'clock position showed fibrocystic changes with usual ductal hyperplasia.  Her cancer was ER/PR positive, HER2 amplified with a Ki-67 of 50%.    Since her last visit, she   began chemotherapy on 05/18/2022 and completed this on 09/16/2022.  She has also continued with HER2 targeted therapy.  She was taken to the operating room on 11/07/2022 with Dr. Magnus Ivan for a left lumpectomy with sentinel node biopsy.  Final pathology showed no residual invasive or in situ disease, her original tumor bed measured 1.2 cm showing treatment effect and calcifications but no active malignancy.  3 lymph nodes were examined and all were negative for disease.  She is seen today to discuss adjuvant radiotherapy.    PREVIOUS  RADIATION THERAPY: No   PAST MEDICAL HISTORY:  Past Medical History:  Diagnosis Date   Anemia    history of   Breast cancer, left (HCC)    Dyspnea    even with speaking   Family history of adverse reaction to anesthesia    mother slow to awaken   Family history of breast cancer 05/10/2022   Family history of ovarian cancer 05/10/2022   Fibromyalgia    GERD (gastroesophageal reflux disease)    History of kidney stones    last stone 6 months ago    Migraine    Seasonal allergies    Tinnitus    Vertigo        PAST SURGICAL HISTORY: Past Surgical History:  Procedure Laterality Date   ADENOIDECTOMY  1991   BREAST BIOPSY  11/06/2022   MM LT RADIOACTIVE SEED LOC MAMMO GUIDE 11/06/2022 [facility]   BREAST BIOPSY  11/06/2022   MM LT RADIOACTIVE SEED EA ADD LESION LOC MAMMO GUIDE 11/06/2022 [facility]   BREAST LUMPECTOMY WITH RADIOACTIVE SEED AND SENTINEL LYMPH NODE BIOPSY Left 11/07/2022   Procedure: LEFT BREAST LUMPECTOMY WITH RADIOACTIVE SEEDX2 AND SENTINEL LYMPH NODE BIOPSY;  Surgeon: Abigail Miyamoto, MD;  Location: [facility];  Service: General;  Laterality: Left;   CYSTOSCOPY WITH RETROGRADE PYELOGRAM, URETEROSCOPY AND STENT PLACEMENT Left 02/22/2021   Procedure: CYSTOSCOPY WITH RETROGRADE PYELOGRAM, URETEROSCOPY AND STENT PLACEMENT;  Surgeon: Belva Agee, MD;  Location: [facility];  Service: Urology;  Laterality: Left;  CYSTOSCOPY/URETEROSCOPY/HOLMIUM LASER/STENT PLACEMENT Left 03/10/2021   Procedure: CYSTOSCOPY RETROGRADE, LEFT URETEROSCOPY/ STONE BASKETRY /HOLMIUM LASER/STENT EXCHANGE;  Surgeon: Crist Fat, MD;  Location: [facility];  Service: Urology;  Laterality: Left;   ESOPHAGOGASTRODUODENOSCOPY N/A 11/17/2013   Procedure: ESOPHAGOGASTRODUODENOSCOPY (EGD);  Surgeon: Rachael Fee, MD;  Location: [facility] END[facility];  Service: Endoscopy;  Laterality: N/A;   ESOPHAGOGASTRODUODENOSCOPY N/A 10/15/2015   Procedure: ESOPHAGOGASTRODUODENOSCOPY  (EGD);  Surgeon: Iva Boop, MD;  Location: [facility] END[facility]COPY;  Service: Endoscopy;  Laterality: N/A;   HOLMIUM LASER APPLICATION  03/10/2021   Procedure: HOLMIUM LASER APPLICATION;  Surgeon: Crist Fat, MD;  Location: [facility];  Service: Urology;;   NASAL SEPTOPLASTY W/ TURBINOPLASTY Bilateral 03/18/2018   Procedure: BILATERAL NASAL SEPTOPLASTY WITH TURBINATE REDUCTION;  Surgeon: Flo Shanks, MD;  Location: [facility];  Service: ENT;  Laterality: Bilateral;   PORTACATH PLACEMENT Right 05/17/2022   Procedure: PORT-A-CATH INSERTION WITH ULTRASOUND GUIDANCE;  Surgeon: Abigail Miyamoto, MD;  Location: [facility];  Service: General;  Laterality: Right;   TONSILLECTOMY  1991   TUBAL LIGATION  2005     FAMILY HISTORY:  Family History  Problem Relation Age of Onset   Asthma Mother    Diabetes Mother    Colon polyps Mother    Ovarian cancer Maternal Grandmother 73   Diabetes Maternal Grandmother    Esophageal cancer Maternal Grandfather        d. 28   Breast cancer Other        MGM's sisters x2   Cervical cancer Cousin        mat cousin; dx <40     SOCIAL HISTORY:  reports that she has never smoked. She has never used smokeless tobacco. She reports that she does not drink alcohol and does not use drugs.  The patient is single but is engaged.  She works for [employer]. She lives in [place], [place].   ALLERGIES: Amoxicillin, Coconut (cocos nucifera), Penicillins, Duloxetine, Hydrocodone, Hydrocodone-acetaminophen, and Moxifloxacin   MEDICATIONS:  Current Outpatient Medications  Medication Sig Dispense Refill   cholestyramine (QUESTRAN) 4 g packet TAKE 1 PACKET (4 G TOTAL) BY MOUTH 2 TIMES DAILY. (Patient not taking: Reported on 10/17/2022) 180 packet 1   diphenoxylate-atropine (LOMOTIL) 2.5-0.025 MG tablet Take 1 tablet by mouth 4 (four) times daily as needed for diarrhea or loose stools. (Patient not taking:  Reported on 10/17/2022) 30 tablet 0   lidocaine-prilocaine (EMLA) cream Apply 1 Application topically as needed. 30 g 0   ondansetron (ZOFRAN-ODT) 4 MG disintegrating tablet 4mg  ODT q4 hours prn nausea/vomit 12 tablet 0   potassium chloride SA (KLOR-CON M) 20 MEQ tablet Take 1 tablet (20 mEq total) by mouth daily. 3 tablet 0   traMADol (ULTRAM) 50 MG tablet Take 1 tablet (50 mg total) by mouth every 6 (six) hours as needed for moderate pain or severe pain. (Patient not taking: Reported on 11/28/2022) 25 tablet 0   No current facility-administered medications for this encounter.     REVIEW OF SYSTEMS: On review of systems, the patient reports that she is doing well since her surgery and feels like she's getting energy back and has been able to keep normal activities and work ongoing during her therapy. No breast specific concerns are verbalized.      PHYSICAL EXAM:  Wt Readings from Last 3 Encounters:  12/05/22 157 lb (71.2 kg)  11/28/22 160 lb 4 oz (72.7 kg)  11/20/22 156 lb (70.8  kg)   Temp Readings from Last 3 Encounters:  12/05/22 (!) 97.2 F (36.2 C) (Temporal)  11/28/22 98.1 F (36.7 C) (Oral)  11/28/22 (!) 97.5 F (36.4 C) (Temporal)   BP Readings from Last 3 Encounters:  12/05/22 111/69  11/28/22 105/69  11/28/22 102/65   Pulse Readings from Last 3 Encounters:  12/05/22 99  11/28/22 64  11/28/22 79    In general this is a well appearing Caucasian female in no acute distress. She's alert and oriented x4 and appropriate throughout the examination. Cardiopulmonary assessment is negative for acute distress and she exhibits normal effort. Her left breast reveals a well healed surgical incision site without erythema, separation, or drainage. Similar findings are noted of the axillary site.      ECOG =1  0 - Asymptomatic (Fully active, able to carry on all predisease activities without restriction)  1 - Symptomatic but completely ambulatory (Restricted in physically  strenuous activity but ambulatory and able to carry out work of a light or sedentary nature. For example, light housework, office work)  2 - Symptomatic, <50% in bed during the day (Ambulatory and capable of all self care but unable to carry out any work activities. Up and about more than 50% of waking hours)  3 - Symptomatic, >50% in bed, but not bedbound (Capable of only limited self-care, confined to bed or chair 50% or more of waking hours)  4 - Bedbound (Completely disabled. Cannot carry on any self-care. Totally confined to bed or chair)  5 - Death   Santiago Glad MM, Creech RH, Tormey DC, et al. [phone]). "Toxicity and response criteria of the [facility]". Am. Evlyn Clines. Oncol. 5 (6): 649-55    LABORATORY DATA:  Lab Results  Component Value Date   WBC 2.8 (L) 11/28/2022   HGB 10.0 (L) 11/28/2022   HCT 28.7 (L) 11/28/2022   MCV 93.8 11/28/2022   PLT 206 11/28/2022   Lab Results  Component Value Date   NA 143 11/28/2022   K 3.5 11/28/2022   CL 108 11/28/2022   CO2 27 11/28/2022   Lab Results  Component Value Date   ALT 6 11/28/2022   AST 9 (L) 11/28/2022   ALKPHOS 57 11/28/2022   BILITOT 0.3 11/28/2022      RADIOGRAPHY: DG Chest Port 1 View  Result Date: 11/20/2022 CLINICAL DATA:  Questionable sepsis, history of breast cancer EXAM: PORTABLE CHEST 1 VIEW COMPARISON:  12/12/2008 FINDINGS: The heart size and mediastinal contours are within normal limits. Both lungs are clear. Right IJ power port catheter tip upper SVC level. Trachea midline. Postop changes of the left breast and axilla. The visualized skeletal structures are unremarkable. IMPRESSION: No active disease. Electronically Signed   By: Judie Petit.  Shick M.D.   On: 11/20/2022 16:05   MM Breast Surgical Specimen  Result Date: 11/07/2022 CLINICAL DATA:  Evaluate surgical specimen following lumpectomy for LEFT breast cancers. EXAM: SPECIMEN RADIOGRAPH OF THE LEFT BREAST COMPARISON:  Previous exam(s). FINDINGS:  Status post excision of the LEFT breast. The radioactive seed, BARBELL biopsy clip and COIL biopsy clip are present and intact. IMPRESSION: Specimen radiograph of the LEFT breast. Electronically Signed   By: Harmon Pier M.D.   On: 11/07/2022 16:37  MM LT RAD SEED EA ADD LESION LOC MAMMO  Result Date: 11/06/2022 CLINICAL DATA:  Lumpectomy localization of left breast invasive ductal carcinoma and DCIS, marked a coil shaped biopsy marker clip. Pre surgical excision localization of an anterior left breast biopsy with  discordant benign results, marked with a barbell shaped biopsy marker clip. EXAM: MAMMOGRAPHIC GUIDED RADIOACTIVE SEED LOCALIZATION OF THE LEFT BREAST X 2 COMPARISON:  Previous exam(s). FINDINGS: Patient presents for radioactive seed localization prior to left lumpectomy and left breast excision. I met with the patient and we discussed the procedure of seed localization including benefits and alternatives. We discussed the high likelihood of successful procedures. We discussed the risks of the procedures including infection, bleeding, tissue injury and further surgery. We discussed the low dose of radioactivity involved in the procedures. Informed, written consent was given. The usual time-out protocol was performed immediately prior to the procedures. SITE 1: COIL SHAPED BIOPSY MARKER CLIP AND RESIDUAL CALCIFICATIONS IN THE MID TO POSTERIOR UPPER OUTER LEFT BREAST Using mammographic guidance, sterile technique, 1% lidocaine and an I-125 radioactive seed, the coil shaped biopsy marker clip and residual calcifications in the mid to posterior upper outer left breast were localized using a lateral approach. The follow-up mammogram images confirm the seed in the expected location and were marked for Dr. Magnus Ivan. Follow-up survey of the patient confirms presence of the radioactive seed. Order number of I-125 seed:  [id number]. Total activity:  0.261 mCi reference Date: 10/13/2022 SITE 2: BARBELL SHAPED  BIOPSY MARKER CLIP IN THE ANTERIOR LEFT BREAST Using mammographic guidance, sterile technique, 1% lidocaine and an I-125 radioactive seed, barbell shaped biopsy marker clip in the anterior left breast was localized using a lateral approach. The follow-up mammogram images confirm the seed in the expected location and were marked for Dr. Magnus Ivan. Follow-up survey of the patient confirms presence of the radioactive seed. Order number of I-125 seed:  [id number]. Total activity:  0.258 mCi reference Date: 10/16/2022 The patient tolerated the procedures well and was released from the Breast Center. She was given instructions regarding seed removal. IMPRESSION: Radioactive seed localization left breast x 2. No apparent complications. Electronically Signed   By: Beckie Salts M.D.   On: 11/06/2022 15:35  MM LT RADIOACTIVE SEED LOC MAMMO GUIDE  Result Date: 11/06/2022 CLINICAL DATA:  Lumpectomy localization of left breast invasive ductal carcinoma and DCIS, marked a coil shaped biopsy marker clip. Pre surgical excision localization of an anterior left breast biopsy with discordant benign results, marked with a barbell shaped biopsy marker clip. EXAM: MAMMOGRAPHIC GUIDED RADIOACTIVE SEED LOCALIZATION OF THE LEFT BREAST X 2 COMPARISON:  Previous exam(s). FINDINGS: Patient presents for radioactive seed localization prior to left lumpectomy and left breast excision. I met with the patient and we discussed the procedure of seed localization including benefits and alternatives. We discussed the high likelihood of successful procedures. We discussed the risks of the procedures including infection, bleeding, tissue injury and further surgery. We discussed the low dose of radioactivity involved in the procedures. Informed, written consent was given. The usual time-out protocol was performed immediately prior to the procedures. SITE 1: COIL SHAPED BIOPSY MARKER CLIP AND RESIDUAL CALCIFICATIONS IN THE MID TO POSTERIOR UPPER OUTER  LEFT BREAST Using mammographic guidance, sterile technique, 1% lidocaine and an I-125 radioactive seed, the coil shaped biopsy marker clip and residual calcifications in the mid to posterior upper outer left breast were localized using a lateral approach. The follow-up mammogram images confirm the seed in the expected location and were marked for Dr. Magnus Ivan. Follow-up survey of the patient confirms presence of the radioactive seed. Order number of I-125 seed:  [id number]. Total activity:  0.261 mCi reference Date: 10/13/2022 SITE 2: BARBELL SHAPED BIOPSY MARKER CLIP IN THE ANTERIOR LEFT BREAST Using  mammographic guidance, sterile technique, 1% lidocaine and an I-125 radioactive seed, barbell shaped biopsy marker clip in the anterior left breast was localized using a lateral approach. The follow-up mammogram images confirm the seed in the expected location and were marked for Dr. Magnus Ivan. Follow-up survey of the patient confirms presence of the radioactive seed. Order number of I-125 seed:  [id number]. Total activity:  0.258 mCi reference Date: 10/16/2022 The patient tolerated the procedures well and was released from the Breast Center. She was given instructions regarding seed removal. IMPRESSION: Radioactive seed localization left breast x 2. No apparent complications. Electronically Signed   By: Beckie Salts M.D.   On: 11/06/2022 15:35      IMPRESSION/PLAN: 1. 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 Dr. Mitzi Hansen has reviewed her final pathology findings. She and I discussed the reviewed the rationale for external radiotherapy to the breast  to reduce risks of local recurrence followed by antiestrogen therapy. We discussed the risks, benefits, short, and long term effects of radiotherapy, as well as the curative intent, and the patient is interested in proceeding. We reviewed the delivery and logistics  of radiotherapy and Dr. Mitzi Hansen would recommend up to 6 1/2 weeks of radiotherapy to the left breast with deep inspiration breath hold technique. We also discussed that insurance may only cover a hypofractionated course, but we would prefer 6 1/2 weeks based on longer term outcome data for that regimen on such a young patient. She is in favor of 6 1/2 weeks as well but is aware this may be dictated by insurance coverage. Written consent is obtained and placed in the chart, a copy was provided to the patient. She will simulate today.  2.  Contraceptive Counseling. The patient has had prior tubal ligation. She will not need pregnancy testing prior to any radiation.    In a visit lasting 45 minutes, greater than 50% of the time was spent face to face reviewing her case, as well as in preparation of, discussing, and coordinating the patient's care.       Osker Mason, [facility]    **Disclaimer: This note was dictated with voice recognition software. Similar sounding words can inadvertently be transcribed and this note may contain transcription errors which may not have been corrected upon publication of note.**

## 2022-12-05 NOTE — Progress Notes (Addendum)
Nursing interview for a 48 yr old female w/ Malignant neoplasm of upper-outer quadrant of left breast in female, estrogen receptor positive (HCC).  Patient identity verified x2.  Patient reports doing well. No discomfort conveyed at this time.  Meaningful use complete. LMP-07/29/2022- (Due to Chemo tx's), and Tubal ligation- No chances of pregnancy.  BP 111/69 (BP Location: Right Arm, Patient Position: Sitting, Cuff Size: Normal)   Pulse 99   Temp (!) 97.2 F (36.2 C) (Temporal)   Resp 20   Ht 5\' 8"  (1.727 m)   Wt 157 lb (71.2 kg)   LMP 07/29/2022   SpO2 99%   BMI 23.87 kg/m   This concludes the interview.   Ruel Favors, LPN

## 2022-12-07 ENCOUNTER — Other Ambulatory Visit: Payer: Self-pay

## 2022-12-11 ENCOUNTER — Encounter: Payer: Self-pay | Admitting: *Deleted

## 2022-12-11 DIAGNOSIS — C50412 Malignant neoplasm of upper-outer quadrant of left female breast: Secondary | ICD-10-CM | POA: Insufficient documentation

## 2022-12-11 DIAGNOSIS — Z170 Estrogen receptor positive status [ER+]: Secondary | ICD-10-CM | POA: Insufficient documentation

## 2022-12-15 ENCOUNTER — Telehealth: Payer: Self-pay | Admitting: Radiology

## 2022-12-15 DIAGNOSIS — Z170 Estrogen receptor positive status [ER+]: Secondary | ICD-10-CM | POA: Diagnosis not present

## 2022-12-15 DIAGNOSIS — Z5112 Encounter for antineoplastic immunotherapy: Secondary | ICD-10-CM | POA: Diagnosis not present

## 2022-12-15 DIAGNOSIS — Z5111 Encounter for antineoplastic chemotherapy: Secondary | ICD-10-CM | POA: Diagnosis not present

## 2022-12-15 DIAGNOSIS — C50412 Malignant neoplasm of upper-outer quadrant of left female breast: Secondary | ICD-10-CM | POA: Diagnosis not present

## 2022-12-15 NOTE — Telephone Encounter (Signed)
Patient was originally scheduled for radiation treatment on 12/19/22 at 2pm. However, she has a chemotherapy infusion that day from 11:30-3:30pm. She has been rescheduled for radiation at 3:45pm to avoid double booking. I called patient to make her aware of the schedule change. She expressed understanding and knows to come for treatment after her infusion on 12/19/22.    Joyice Faster, PA-C

## 2022-12-15 NOTE — Telephone Encounter (Signed)
I called to tell the patient that her insurance will not cover the 6.5 week course of treatment. As a result, we will proceed with the 4-week hypofractionated treatment plan. Dr. Mitzi Hansen agrees that she is still a good candidate for this course of treatment. The patient expressed understanding and willingness to continue with this course of treatment. All questions were answered. Her radiation treatments will begin as scheduled on Monday.

## 2022-12-18 ENCOUNTER — Ambulatory Visit
Admission: RE | Admit: 2022-12-18 | Discharge: 2022-12-18 | Disposition: A | Discharge status: Home / Self Care | Payer: 59 | Source: Ambulatory Visit | Attending: Radiation Oncology | Admitting: Radiation Oncology

## 2022-12-18 ENCOUNTER — Other Ambulatory Visit: Payer: Self-pay

## 2022-12-18 DIAGNOSIS — Z5112 Encounter for antineoplastic immunotherapy: Secondary | ICD-10-CM | POA: Diagnosis not present

## 2022-12-18 DIAGNOSIS — Z170 Estrogen receptor positive status [ER+]: Secondary | ICD-10-CM | POA: Diagnosis not present

## 2022-12-18 DIAGNOSIS — C50412 Malignant neoplasm of upper-outer quadrant of left female breast: Secondary | ICD-10-CM | POA: Diagnosis not present

## 2022-12-18 DIAGNOSIS — Z510 Encounter for antineoplastic radiation therapy: Secondary | ICD-10-CM | POA: Diagnosis not present

## 2022-12-18 DIAGNOSIS — Z5111 Encounter for antineoplastic chemotherapy: Secondary | ICD-10-CM | POA: Diagnosis not present

## 2022-12-18 LAB — RAD ONC ARIA SESSION SUMMARY
Course Elapsed Days: 0
Plan Fractions Treated to Date: 1
Plan Prescribed Dose Per Fraction: 2.66 Gy
Plan Total Fractions Prescribed: 16
Plan Total Prescribed Dose: 42.56 Gy
Reference Point Dosage Given to Date: 2.66 Gy
Reference Point Session Dosage Given: 2.66 Gy
Session Number: 1

## 2022-12-19 ENCOUNTER — Ambulatory Visit
Admission: RE | Admit: 2022-12-19 | Discharge: 2022-12-19 | Disposition: A | Discharge status: Home / Self Care | Payer: 59 | Source: Ambulatory Visit | Attending: Radiation Oncology | Admitting: Radiation Oncology

## 2022-12-19 ENCOUNTER — Inpatient Hospital Stay: Payer: 59

## 2022-12-19 ENCOUNTER — Other Ambulatory Visit: Payer: Self-pay

## 2022-12-19 ENCOUNTER — Inpatient Hospital Stay (HOSPITAL_BASED_OUTPATIENT_CLINIC_OR_DEPARTMENT_OTHER): Payer: 59 | Admitting: Hematology and Oncology

## 2022-12-19 ENCOUNTER — Inpatient Hospital Stay: Payer: 59 | Attending: Hematology and Oncology

## 2022-12-19 VITALS — BP 118/75 | HR 80 | Temp 97.2°F | Resp 18 | Ht 68.0 in | Wt 159.8 lb

## 2022-12-19 DIAGNOSIS — Z5111 Encounter for antineoplastic chemotherapy: Secondary | ICD-10-CM | POA: Insufficient documentation

## 2022-12-19 DIAGNOSIS — C50412 Malignant neoplasm of upper-outer quadrant of left female breast: Secondary | ICD-10-CM | POA: Diagnosis not present

## 2022-12-19 DIAGNOSIS — Z5112 Encounter for antineoplastic immunotherapy: Secondary | ICD-10-CM | POA: Diagnosis not present

## 2022-12-19 DIAGNOSIS — Z170 Estrogen receptor positive status [ER+]: Secondary | ICD-10-CM

## 2022-12-19 DIAGNOSIS — Z510 Encounter for antineoplastic radiation therapy: Secondary | ICD-10-CM | POA: Diagnosis not present

## 2022-12-19 LAB — CBC WITH DIFFERENTIAL (CANCER CENTER ONLY)
Abs Immature Granulocytes: 0 10*3/uL (ref 0.00–0.07)
Basophils Absolute: 0 10*3/uL (ref 0.0–0.1)
Basophils Relative: 0 %
Eosinophils Absolute: 0.1 10*3/uL (ref 0.0–0.5)
Eosinophils Relative: 3 %
HCT: 29.9 % — ABNORMAL LOW (ref 36.0–46.0)
Hemoglobin: 10.4 g/dL — ABNORMAL LOW (ref 12.0–15.0)
Immature Granulocytes: 0 %
Lymphocytes Relative: 35 %
Lymphs Abs: 1 10*3/uL (ref 0.7–4.0)
MCH: 32.3 pg (ref 26.0–34.0)
MCHC: 34.8 g/dL (ref 30.0–36.0)
MCV: 92.9 fL (ref 80.0–100.0)
Monocytes Absolute: 0.2 10*3/uL (ref 0.1–1.0)
Monocytes Relative: 7 %
Neutro Abs: 1.6 10*3/uL — ABNORMAL LOW (ref 1.7–7.7)
Neutrophils Relative %: 55 %
Platelet Count: 153 10*3/uL (ref 150–400)
RBC: 3.22 MIL/uL — ABNORMAL LOW (ref 3.87–5.11)
RDW: 12.8 % (ref 11.5–15.5)
WBC Count: 2.8 10*3/uL — ABNORMAL LOW (ref 4.0–10.5)
nRBC: 0 % (ref 0.0–0.2)

## 2022-12-19 LAB — RAD ONC ARIA SESSION SUMMARY
Course Elapsed Days: 1
Plan Fractions Treated to Date: 2
Plan Prescribed Dose Per Fraction: 2.66 Gy
Plan Total Fractions Prescribed: 16
Plan Total Prescribed Dose: 42.56 Gy
Reference Point Dosage Given to Date: 5.32 Gy
Reference Point Session Dosage Given: 2.66 Gy
Session Number: 2

## 2022-12-19 MED ORDER — DIPHENHYDRAMINE HCL 25 MG PO CAPS
50.0000 mg | ORAL_CAPSULE | Freq: Once | ORAL | Status: AC
  Administered 2022-12-19: 50 mg via ORAL
  Filled 2022-12-19: qty 2

## 2022-12-19 MED ORDER — SODIUM CHLORIDE 0.9% FLUSH
10.0000 mL | INTRAVENOUS | Status: DC | PRN
  Administered 2022-12-19: 10 mL

## 2022-12-19 MED ORDER — ACETAMINOPHEN 325 MG PO TABS
650.0000 mg | ORAL_TABLET | Freq: Once | ORAL | Status: AC
  Administered 2022-12-19: 650 mg via ORAL
  Filled 2022-12-19: qty 2

## 2022-12-19 MED ORDER — SODIUM CHLORIDE 0.9 % IV SOLN: Freq: Once | INTRAVENOUS | Status: AC

## 2022-12-19 MED ORDER — HEPARIN SOD (PORK) LOCK FLUSH 100 UNIT/ML IV SOLN
500.0000 [IU] | Freq: Once | INTRAVENOUS | Status: AC | PRN
  Administered 2022-12-19: 500 [IU]

## 2022-12-19 MED ORDER — TRASTUZUMAB-ANNS CHEMO 150 MG IV SOLR
6.0000 mg/kg | Freq: Once | INTRAVENOUS | Status: AC
  Administered 2022-12-19: 420 mg via INTRAVENOUS
  Filled 2022-12-19: qty 20

## 2022-12-19 MED ORDER — SODIUM CHLORIDE 0.9 % IV SOLN
420.0000 mg | Freq: Once | INTRAVENOUS | Status: AC
  Administered 2022-12-19: 420 mg via INTRAVENOUS
  Filled 2022-12-19: qty 14

## 2022-12-19 MED ORDER — SODIUM CHLORIDE 0.9% FLUSH
10.0000 mL | Freq: Once | INTRAVENOUS | Status: AC
  Administered 2022-12-19: 10 mL

## 2022-12-19 NOTE — Patient Instructions (Signed)
[facility]  Discharge Instructions: Thank you for choosing [facility] to provide your oncology and hematology care.   If you have a lab appointment with the Cancer Center, please go directly to the Cancer Center and check in at the registration area.   Wear comfortable clothing and clothing appropriate for easy access to any Portacath or PICC line.   We strive to give you quality time with your provider. You may need to reschedule your appointment if you arrive late (15 or more minutes).  Arriving late affects you and other patients whose appointments are after yours.  Also, if you miss three or more appointments without notifying the office, you may be dismissed from the clinic at the provider's discretion.      For prescription refill requests, have your pharmacy contact our office and allow 72 hours for refills to be completed.    Today you received the following chemotherapy and/or immunotherapy agents herceptin perjeta      To help prevent nausea and vomiting after your treatment, we encourage you to take your nausea medication as directed.  BELOW ARE SYMPTOMS THAT SHOULD BE REPORTED IMMEDIATELY: *FEVER GREATER THAN 100.4 F (38 C) OR HIGHER *CHILLS OR SWEATING *NAUSEA AND VOMITING THAT IS NOT CONTROLLED WITH YOUR NAUSEA MEDICATION *UNUSUAL SHORTNESS OF BREATH *UNUSUAL BRUISING OR BLEEDING *URINARY PROBLEMS (pain or burning when urinating, or frequent urination) *BOWEL PROBLEMS (unusual diarrhea, constipation, pain near the anus) TENDERNESS IN MOUTH AND THROAT WITH OR WITHOUT PRESENCE OF ULCERS (sore throat, sores in mouth, or a toothache) UNUSUAL RASH, SWELLING OR PAIN  UNUSUAL VAGINAL DISCHARGE OR ITCHING   Items with * indicate a potential emergency and should be followed up as soon as possible or go to the Emergency Department if any problems should occur.  Please show the CHEMOTHERAPY ALERT CARD or IMMUNOTHERAPY ALERT CARD  at check-in to the Emergency Department and triage nurse.  Should you have questions after your visit or need to cancel or reschedule your appointment, please contact [facility]  Dept: [phone]  and follow the prompts.  Office hours are 8:00 a.m. to 4:30 p.m. Monday - Friday. Please note that voicemails left after 4:00 p.m. may not be returned until the following business day.  We are closed weekends and major holidays. You have access to a nurse at all times for urgent questions. Please call the main number to the clinic Dept: [phone] and follow the prompts.   For any non-urgent questions, you may also contact your provider using MyChart. We now offer e-Visits for anyone 50 and older to request care online for non-urgent symptoms. For details visit [email or web address].   Also download the MyChart app! Go to the app store, search "MyChart", open the app, select [facility], and log in with your MyChart username and password.

## 2022-12-19 NOTE — Assessment & Plan Note (Signed)
This is a 47 year old premenopausal female patient with newly diagnosed T2 N0 M0 ER/PR and HER2 amplified invasive ductal carcinoma of the left breast referred to breast [facility] for additional recommendations.  Given large tumor measuring almost 4 cm in longest dimension and HER2 amplification, we have discussed about neoadjuvant chemotherapy.  I have recommended neoadjuvant TCHP followed by surgery and adjuvant HER2 based therapy depending on response.  She had path ER with neoadjuvant chemotherapy and is now on adjuvant Herceptin and pertuzumab.  She is also on adjuvant radiation.  We have today discussed about adjuvant antiestrogen therapy options.  My recommendation was to consider ovarian suppression with aromatase inhibitors.  I discussed the mechanism of action and adverse effects with each class of medications including but not limited to postmenopausal symptoms such as fatigue, hot flashes, vaginal dryness, bone density loss in the long run etc.  She is willing to try what ever is the most effective treatment.  She will come back and see me end of July to initiate antiestrogen therapy since she will be done with adjuvant radiation.  Next echo scheduled for June.  No concerns on physical exam today.   Rachel Moulds MD

## 2022-12-19 NOTE — Progress Notes (Signed)
[facility] Cancer Follow up:    [name] [name], [name], DO [address] [place] [address]   DIAGNOSIS:  Cancer Staging  Malignant neoplasm of upper-outer quadrant of left breast in female, estrogen receptor positive (HCC) Staging form: Breast, AJCC 8th Edition - Clinical stage from 05/08/2022: Stage IB (cT2, cN0, cM0, G2, ER+, PR+, HER2+) - Signed by Ronny Bacon, PA-C on 05/08/2022 Stage prefix: Initial diagnosis Method of lymph node assessment: Clinical Histologic grading system: 3 grade system - Pathologic stage from 11/07/2022: No Stage Recommended (ypT0, pN0, cM0) - Signed by Loa Socks, NP on 11/28/2022 Stage prefix: Post-therapy   SUMMARY OF ONCOLOGIC HISTORY: Oncology History  Malignant neoplasm of upper-outer quadrant of left breast in female, estrogen receptor positive (HCC)  04/25/2022 Imaging   Patient had diagnostic bilateral mammogram given palpable mass in the left breast.  This showed a 2.6 x 3.5 x 3 cm irregular mass as well as possible satellite mass which is indeterminate.  An ultrasound was recommended.  Ultrasound of the left breast mass showed 3.9 x 2.4 x 2.3 cm irregular mass with an irregular and spiculated margin at 3:00 middle depth 5 cm from the nipple, irregular mass is hypoechoic.  In the left breast at 1 o'clock position, 3 cm from the nipple there is a round mass measuring 0.3 x 0.2 x 0.3 cm.  No significant abnormalities were seen sonographically in the left axilla.  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  Left breast needle core biopsy at 1:00 3 cm from the nipple showed fibrocystic changes with usual ductal hyperplasia negative for malignancy.  Prognostic showed ER 95% PR 40%,  HER2 3+ KI of 50%   05/08/2022 Initial Diagnosis   Malignant neoplasm of upper-outer quadrant of left breast in female, estrogen receptor positive (HCC)   05/08/2022 Cancer Staging   Staging form: Breast, AJCC 8th Edition - Clinical stage from 05/08/2022: Stage IB (cT2, cN0, cM0, G2, ER+, PR+, HER2+) - Signed by Ronny Bacon, PA-C on 05/08/2022 Stage prefix: Initial diagnosis Method of lymph node assessment: Clinical Histologic grading system: 3 grade system   05/18/2022 - 09/16/2022 Chemotherapy   Patient is on Treatment Plan : BREAST  Docetaxel + Carboplatin + Trastuzumab + Pertuzumab  (TCHP) q21d      05/23/2022 Genetic Testing   Negative genetics for [facility] CustomNext-Cancer +RNAinsight Panel.  VUS in ATM at [name] ([id number]). Report date is 05/23/2022.   The CustomNext-Cancer+RNAinsight panel offered by [employer] includes sequencing and rearrangement analysis for the following 47 genes:  APC, ATM, AXIN2, BARD1, BMPR1A, BRCA1, BRCA2, BRIP1, CDH1, CDK4, CDKN2A, CHEK2, DICER1, EPCAM, GREM1, HOXB13, MEN1, MLH1, MSH2, MSH3, MSH6, MUTYH, NBN, NF1, NF2, NTHL1, PALB2, PMS2, POLD1, POLE, PTEN, RAD51C, RAD51D, RECQL, RET, SDHA, SDHAF2, SDHB, SDHC, SDHD, SMAD4, SMARCA4, STK11, TP53, TSC1, TSC2, and VHL.  RNA data is routinely analyzed for use in variant interpretation for all genes.   10/17/2022 -  Chemotherapy   Patient is on Treatment Plan : BREAST Trastuzumab + Pertuzumab q21d x 11 cycles     11/07/2022 Surgery   Left breast lumpectomy: No residual invasive carcinoma or DCIS.  3 SLN negative for cancer, ypT0,pN0   11/07/2022 Cancer Staging   Staging form: Breast, AJCC  8th Edition - Pathologic stage from 11/07/2022: No Stage Recommended (ypT0, pN0, cM0) - Signed by Loa Socks, NP on 11/28/2022 Stage prefix: Post-therapy     CURRENT THERAPY: Herceptin Perjeta  INTERVAL HISTORY:  [name] [name] 47 y.o. 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  Since her last visit she underwent left breast lumpectomy and there was no evidence of residual cancer in the breast or in any lymph nodes.  She is here to continue on Herceptin Perjeta.  She just started adjuvant radiation as well.  She has her next echo scheduled next week.  She denies any complaints at all.  She feels so good.  She has been eating well, can taste food again, hair is growing back.  No diarrhea.  Rest of the pertinent 10 point ROS reviewed and negative.   Patient Active Problem List   Diagnosis Date Noted   Genetic testing 05/30/2022   Family history of breast cancer 05/10/2022   Family history of ovarian cancer 05/10/2022   Malignant neoplasm of upper-outer quadrant of left breast in female, estrogen receptor positive (HCC) 05/08/2022   Food impaction of esophagus    Schatzki's ring    Hx of migraine headaches 11/17/2013   Ureteral calculus 11/17/2013   GERD (gastroesophageal reflux disease) 11/17/2013   Esophageal obstruction due to food impaction 11/17/2013    is allergic to amoxicillin, coconut (cocos nucifera), penicillins, duloxetine, hydrocodone, hydrocodone-acetaminophen, and moxifloxacin.  MEDICAL HISTORY: Past Medical History:  Diagnosis Date   Anemia    history of   Breast cancer, left (HCC)    Dyspnea    even with speaking   Family history of adverse reaction to anesthesia    mother slow to awaken   Family history of breast cancer 05/10/2022   Family history of ovarian cancer 05/10/2022   Fibromyalgia    GERD (gastroesophageal reflux disease)    History of kidney stones    last stone 6 months ago    Migraine    Seasonal allergies    Tinnitus    Vertigo     SURGICAL HISTORY: Past Surgical History:  Procedure Laterality Date   ADENOIDECTOMY  1991   BREAST BIOPSY  11/06/2022   MM LT RADIOACTIVE SEED LOC MAMMO GUIDE 11/06/2022 [facility]   BREAST BIOPSY  11/06/2022   MM LT RADIOACTIVE SEED EA ADD  LESION LOC MAMMO GUIDE 11/06/2022 [facility]   BREAST LUMPECTOMY WITH RADIOACTIVE SEED AND SENTINEL LYMPH NODE BIOPSY Left 11/07/2022   Procedure: LEFT BREAST LUMPECTOMY WITH RADIOACTIVE SEEDX2 AND SENTINEL LYMPH NODE BIOPSY;  Surgeon: Abigail Miyamoto, MD;  Location: [facility];  Service: General;  Laterality: Left;   CYSTOSCOPY WITH RETROGRADE PYELOGRAM, URETEROSCOPY AND STENT PLACEMENT Left 02/22/2021   Procedure: CYSTOSCOPY WITH RETROGRADE PYELOGRAM, URETEROSCOPY AND STENT PLACEMENT;  Surgeon: Belva Agee, MD;  Location: [facility];  Service: Urology;  Laterality: Left;   CYSTOSCOPY/URETEROSCOPY/HOLMIUM LASER/STENT PLACEMENT Left 03/10/2021   Procedure: CYSTOSCOPY RETROGRADE, LEFT URETEROSCOPY/ STONE BASKETRY /HOLMIUM LASER/STENT EXCHANGE;  Surgeon: Crist Fat, MD;  Location: [facility];  Service: Urology;  Laterality: Left;   ESOPHAGOGASTRODUODENOSCOPY N/A 11/17/2013   Procedure: ESOPHAGOGASTRODUODENOSCOPY (EGD);  Surgeon: Rachael Fee, MD;  Location: [facility];  Service: Endoscopy;  Laterality: N/A;   ESOPHAGOGASTRODUODENOSCOPY N/A 10/15/2015   Procedure: ESOPHAGOGASTRODUODENOSCOPY (EGD);  Surgeon: Iva Boop, MD;  Location: [facility] ENDOSCOPY;  Service: Endoscopy;  Laterality: N/A;   HOLMIUM LASER APPLICATION  03/10/2021   Procedure: HOLMIUM  LASER APPLICATION;  Surgeon: Crist Fat, MD;  Location: [facility];  Service: Urology;;   NASAL SEPTOPLASTY W/ TURBINOPLASTY Bilateral 03/18/2018   Procedure: BILATERAL NASAL SEPTOPLASTY WITH TURBINATE REDUCTION;  Surgeon: Flo Shanks, MD;  Location: [facility];  Service: ENT;  Laterality: Bilateral;   PORTACATH PLACEMENT Right 05/17/2022   Procedure: PORT-A-CATH INSERTION WITH ULTRASOUND GUIDANCE;  Surgeon: Abigail Miyamoto, MD;  Location: [facility];  Service: General;  Laterality: Right;   TONSILLECTOMY  1991   TUBAL LIGATION  2005    SOCIAL HISTORY: Social History    Socioeconomic History   Marital status: Single    Spouse name: Not on file   Number of children: Not on file   Years of education: Not on file   Highest education level: Not on file  Occupational History   Not on file  Tobacco Use   Smoking status: Never   Smokeless tobacco: Never  Vaping Use   Vaping Use: Never used  Substance and Sexual Activity   Alcohol use: No   Drug use: No   Sexual activity: Not on file  Other Topics Concern   Not on file  Social History Narrative   ** Merged History Encounter **       Social Determinants of Health   Financial Resource Strain: Low Risk  (05/10/2022)   Overall Financial Resource Strain (CARDIA)    Difficulty of Paying Living Expenses: Not very hard  Food Insecurity: No Food Insecurity (12/05/2022)   Hunger Vital Sign    Worried About Running Out of Food in the Last Year: Never true    Ran Out of Food in the Last Year: Never true  Transportation Needs: No Transportation Needs (12/05/2022)   PRAPARE - Administrator, Civil Service (Medical): No    Lack of Transportation (Non-Medical): No  Physical Activity: Not on file  Stress: Not on file  Social Connections: Not on file  Intimate Partner Violence: Not At Risk (12/05/2022)   Humiliation, Afraid, Rape, and Kick questionnaire    Fear of Current or Ex-Partner: No    Emotionally Abused: No    Physically Abused: No    Sexually Abused: No    FAMILY HISTORY: Family History  Problem Relation Age of Onset   Asthma Mother    Diabetes Mother    Colon polyps Mother    Ovarian cancer Maternal Grandmother 50   Diabetes Maternal Grandmother    Esophageal cancer Maternal Grandfather        d. 2   Breast cancer Other        MGM's sisters x2   Cervical cancer Cousin        mat cousin; dx <40    Review of Systems  Constitutional:  Negative for appetite change, chills, fatigue, fever and unexpected weight change.  HENT:   Negative for hearing loss, lump/mass and trouble  swallowing.   Eyes:  Negative for eye problems and icterus.  Respiratory:  Negative for chest tightness, cough and shortness of breath.   Cardiovascular:  Negative for chest pain, leg swelling and palpitations.  Gastrointestinal:  Negative for abdominal distention, abdominal pain, constipation, diarrhea, nausea and vomiting.  Endocrine: Negative for hot flashes.  Genitourinary:  Negative for difficulty urinating.   Musculoskeletal:  Negative for arthralgias.  Skin:  Negative for itching and rash.  Neurological:  Negative for dizziness, extremity weakness, headaches and numbness.  Hematological:  Negative for adenopathy. Does not bruise/bleed easily.  Psychiatric/Behavioral:  Negative for  depression. The patient is not nervous/anxious.       PHYSICAL EXAMINATION     Vitals:   12/19/22 1203  BP: 118/75  Pulse: 80  Resp: 18  Temp: (!) 97.2 F (36.2 C)  SpO2: 98%    Physical Exam Constitutional:      General: She is not in acute distress.    Appearance: Normal appearance. She is not toxic-appearing.  HENT:     Head: Normocephalic and atraumatic.  Eyes:     General: No scleral icterus. Cardiovascular:     Rate and Rhythm: Normal rate and regular rhythm.     Pulses: Normal pulses.     Heart sounds: Normal heart sounds.  Pulmonary:     Effort: Pulmonary effort is normal.     Breath sounds: Normal breath sounds.  Abdominal:     General: Abdomen is flat. Bowel sounds are normal. There is no distension.     Palpations: Abdomen is soft.     Tenderness: There is no abdominal tenderness.  Musculoskeletal:        General: No swelling.     Cervical back: Neck supple.  Lymphadenopathy:     Cervical: No cervical adenopathy.  Skin:    General: Skin is warm and dry.     Findings: No rash.  Neurological:     General: No focal deficit present.     Mental Status: She is alert.  Psychiatric:        Mood and Affect: Mood normal.        Behavior: Behavior normal.      LABORATORY DATA:  CBC    Component Value Date/Time   WBC 2.8 (L) 12/19/2022 1144   WBC 6.9 11/20/2022 1624   RBC 3.22 (L) 12/19/2022 1144   HGB 10.4 (L) 12/19/2022 1144   HCT 29.9 (L) 12/19/2022 1144   PLT 153 12/19/2022 1144   MCV 92.9 12/19/2022 1144   MCH 32.3 12/19/2022 1144   MCHC 34.8 12/19/2022 1144   RDW 12.8 12/19/2022 1144   LYMPHSABS 1.0 12/19/2022 1144   MONOABS 0.2 12/19/2022 1144   EOSABS 0.1 12/19/2022 1144   BASOSABS 0.0 12/19/2022 1144    CMP     Component Value Date/Time   NA 143 11/28/2022 0826   K 3.5 11/28/2022 0826   CL 108 11/28/2022 0826   CO2 27 11/28/2022 0826   GLUCOSE 98 11/28/2022 0826   BUN 13 11/28/2022 0826   CREATININE 1.05 (H) 11/28/2022 0826   CALCIUM 8.9 11/28/2022 0826   PROT 6.3 (L) 11/28/2022 0826   ALBUMIN 3.6 11/28/2022 0826   AST 9 (L) 11/28/2022 0826   ALT 6 11/28/2022 0826   ALKPHOS 57 11/28/2022 0826   BILITOT 0.3 11/28/2022 0826   GFRNONAA >60 11/28/2022 0826   GFRAA >90 11/04/2014 1100         ASSESSMENT and THERAPY PLAN:   Malignant neoplasm of upper-outer quadrant of left breast in female, estrogen receptor positive (HCC) This is a 47 year old premenopausal female patient with newly diagnosed T2 N0 M0 ER/PR and HER2 amplified invasive ductal carcinoma of the left breast referred to breast MDC for additional recommendations.  Given large tumor measuring almost 4 cm in [name] dimension and HER2 amplification, we have discussed about neoadjuvant chemotherapy.  I have recommended neoadjuvant TCHP followed by surgery and adjuvant HER2 based therapy depending on response.  She had path ER with neoadjuvant chemotherapy and is now on adjuvant Herceptin and pertuzumab.  She is also on adjuvant radiation.  We have today discussed about adjuvant antiestrogen therapy options.  My recommendation was to consider ovarian suppression with aromatase inhibitors.  I discussed the mechanism of action and adverse effects with  each class of medications including but not limited to postmenopausal symptoms such as fatigue, hot flashes, vaginal dryness, bone density loss in the long run etc.  She is willing to try what ever is the most effective treatment.  She will come back and see me end of July to initiate antiestrogen therapy since she will be done with adjuvant radiation.  Next echo scheduled for June.  No concerns on physical exam today.   Rachel Moulds MD    All questions were answered. The patient knows to call the clinic with any problems, questions or concerns. We can certainly see the patient much sooner if necessary.  Total encounter time:30 minutes*in face-to-face visit time, chart review, lab review, care coordination, order entry, and documentation of the encounter time.  Tel. [phone]    Fax. [phone]  *Total Encounter Time as defined by the Centers for Medicare and Medicaid Services includes, in addition to the face-to-face time of a patient visit (documented in the note above) non-face-to-face time: obtaining and reviewing outside history, ordering and reviewing medications, tests or procedures, care coordination (communications with other health care professionals or caregivers) and documentation in the medical record.

## 2022-12-20 ENCOUNTER — Other Ambulatory Visit: Payer: Self-pay

## 2022-12-20 ENCOUNTER — Ambulatory Visit
Admission: RE | Admit: 2022-12-20 | Discharge: 2022-12-20 | Disposition: A | Discharge status: Home / Self Care | Payer: 59 | Source: Ambulatory Visit | Attending: Radiation Oncology | Admitting: Radiation Oncology

## 2022-12-20 DIAGNOSIS — Z5112 Encounter for antineoplastic immunotherapy: Secondary | ICD-10-CM | POA: Diagnosis not present

## 2022-12-20 DIAGNOSIS — C50412 Malignant neoplasm of upper-outer quadrant of left female breast: Secondary | ICD-10-CM | POA: Diagnosis not present

## 2022-12-20 DIAGNOSIS — Z510 Encounter for antineoplastic radiation therapy: Secondary | ICD-10-CM | POA: Diagnosis not present

## 2022-12-20 DIAGNOSIS — Z170 Estrogen receptor positive status [ER+]: Secondary | ICD-10-CM | POA: Diagnosis not present

## 2022-12-20 DIAGNOSIS — Z5111 Encounter for antineoplastic chemotherapy: Secondary | ICD-10-CM | POA: Diagnosis not present

## 2022-12-20 LAB — RAD ONC ARIA SESSION SUMMARY
Course Elapsed Days: 2
Plan Fractions Treated to Date: 3
Plan Prescribed Dose Per Fraction: 2.66 Gy
Plan Total Fractions Prescribed: 16
Plan Total Prescribed Dose: 42.56 Gy
Reference Point Dosage Given to Date: 7.98 Gy
Reference Point Session Dosage Given: 2.66 Gy
Session Number: 3

## 2022-12-21 ENCOUNTER — Ambulatory Visit
Admission: RE | Admit: 2022-12-21 | Discharge: 2022-12-21 | Disposition: A | Discharge status: Home / Self Care | Payer: 59 | Source: Ambulatory Visit | Attending: Radiation Oncology | Admitting: Radiation Oncology

## 2022-12-21 ENCOUNTER — Other Ambulatory Visit: Payer: Self-pay

## 2022-12-21 DIAGNOSIS — Z5112 Encounter for antineoplastic immunotherapy: Secondary | ICD-10-CM | POA: Diagnosis not present

## 2022-12-21 DIAGNOSIS — C50412 Malignant neoplasm of upper-outer quadrant of left female breast: Secondary | ICD-10-CM | POA: Diagnosis not present

## 2022-12-21 DIAGNOSIS — Z5111 Encounter for antineoplastic chemotherapy: Secondary | ICD-10-CM | POA: Diagnosis not present

## 2022-12-21 DIAGNOSIS — Z510 Encounter for antineoplastic radiation therapy: Secondary | ICD-10-CM | POA: Diagnosis not present

## 2022-12-21 DIAGNOSIS — Z170 Estrogen receptor positive status [ER+]: Secondary | ICD-10-CM | POA: Diagnosis not present

## 2022-12-21 LAB — RAD ONC ARIA SESSION SUMMARY
Course Elapsed Days: 3
Plan Fractions Treated to Date: 4
Plan Prescribed Dose Per Fraction: 2.66 Gy
Plan Total Fractions Prescribed: 16
Plan Total Prescribed Dose: 42.56 Gy
Reference Point Dosage Given to Date: 10.64 Gy
Reference Point Session Dosage Given: 2.66 Gy
Session Number: 4

## 2022-12-22 ENCOUNTER — Ambulatory Visit
Admission: RE | Admit: 2022-12-22 | Discharge: 2022-12-22 | Disposition: A | Discharge status: Home / Self Care | Payer: 59 | Source: Ambulatory Visit | Attending: Radiation Oncology | Admitting: Radiation Oncology

## 2022-12-22 ENCOUNTER — Other Ambulatory Visit: Payer: Self-pay

## 2022-12-22 DIAGNOSIS — Z5111 Encounter for antineoplastic chemotherapy: Secondary | ICD-10-CM | POA: Diagnosis not present

## 2022-12-22 DIAGNOSIS — Z5112 Encounter for antineoplastic immunotherapy: Secondary | ICD-10-CM | POA: Diagnosis not present

## 2022-12-22 DIAGNOSIS — Z170 Estrogen receptor positive status [ER+]: Secondary | ICD-10-CM | POA: Diagnosis not present

## 2022-12-22 DIAGNOSIS — C50412 Malignant neoplasm of upper-outer quadrant of left female breast: Secondary | ICD-10-CM | POA: Diagnosis not present

## 2022-12-22 DIAGNOSIS — Z510 Encounter for antineoplastic radiation therapy: Secondary | ICD-10-CM | POA: Diagnosis not present

## 2022-12-22 LAB — RAD ONC ARIA SESSION SUMMARY
Course Elapsed Days: 4
Plan Fractions Treated to Date: 5
Plan Prescribed Dose Per Fraction: 2.66 Gy
Plan Total Fractions Prescribed: 16
Plan Total Prescribed Dose: 42.56 Gy
Reference Point Dosage Given to Date: 13.3 Gy
Reference Point Session Dosage Given: 2.66 Gy
Session Number: 5

## 2022-12-25 ENCOUNTER — Other Ambulatory Visit: Payer: Self-pay

## 2022-12-25 ENCOUNTER — Ambulatory Visit
Admission: RE | Admit: 2022-12-25 | Discharge: 2022-12-25 | Disposition: A | Discharge status: Home / Self Care | Payer: 59 | Source: Ambulatory Visit | Attending: Radiation Oncology | Admitting: Radiation Oncology

## 2022-12-25 DIAGNOSIS — Z170 Estrogen receptor positive status [ER+]: Secondary | ICD-10-CM | POA: Diagnosis not present

## 2022-12-25 DIAGNOSIS — C50412 Malignant neoplasm of upper-outer quadrant of left female breast: Secondary | ICD-10-CM | POA: Diagnosis not present

## 2022-12-25 DIAGNOSIS — Z5112 Encounter for antineoplastic immunotherapy: Secondary | ICD-10-CM | POA: Diagnosis not present

## 2022-12-25 DIAGNOSIS — Z5111 Encounter for antineoplastic chemotherapy: Secondary | ICD-10-CM | POA: Diagnosis not present

## 2022-12-25 DIAGNOSIS — Z510 Encounter for antineoplastic radiation therapy: Secondary | ICD-10-CM | POA: Diagnosis not present

## 2022-12-25 LAB — RAD ONC ARIA SESSION SUMMARY
Course Elapsed Days: 7
Plan Fractions Treated to Date: 6
Plan Prescribed Dose Per Fraction: 2.66 Gy
Plan Total Fractions Prescribed: 16
Plan Total Prescribed Dose: 42.56 Gy
Reference Point Dosage Given to Date: 15.96 Gy
Reference Point Session Dosage Given: 2.66 Gy
Session Number: 6

## 2022-12-26 ENCOUNTER — Other Ambulatory Visit: Payer: Self-pay

## 2022-12-26 ENCOUNTER — Ambulatory Visit
Admission: RE | Admit: 2022-12-26 | Discharge: 2022-12-26 | Disposition: A | Discharge status: Home / Self Care | Payer: 59 | Source: Ambulatory Visit | Attending: Radiation Oncology | Admitting: Radiation Oncology

## 2022-12-26 DIAGNOSIS — Z5112 Encounter for antineoplastic immunotherapy: Secondary | ICD-10-CM | POA: Diagnosis not present

## 2022-12-26 DIAGNOSIS — Z5111 Encounter for antineoplastic chemotherapy: Secondary | ICD-10-CM | POA: Diagnosis not present

## 2022-12-26 DIAGNOSIS — C50412 Malignant neoplasm of upper-outer quadrant of left female breast: Secondary | ICD-10-CM | POA: Diagnosis not present

## 2022-12-26 DIAGNOSIS — Z170 Estrogen receptor positive status [ER+]: Secondary | ICD-10-CM | POA: Diagnosis not present

## 2022-12-26 DIAGNOSIS — Z510 Encounter for antineoplastic radiation therapy: Secondary | ICD-10-CM | POA: Diagnosis not present

## 2022-12-26 LAB — RAD ONC ARIA SESSION SUMMARY
Course Elapsed Days: 8
Plan Fractions Treated to Date: 7
Plan Prescribed Dose Per Fraction: 2.66 Gy
Plan Total Fractions Prescribed: 16
Plan Total Prescribed Dose: 42.56 Gy
Reference Point Dosage Given to Date: 18.62 Gy
Reference Point Session Dosage Given: 2.66 Gy
Session Number: 7

## 2022-12-27 ENCOUNTER — Ambulatory Visit
Admission: RE | Admit: 2022-12-27 | Discharge: 2022-12-27 | Disposition: A | Discharge status: Home / Self Care | Payer: 59 | Source: Ambulatory Visit | Attending: Radiation Oncology | Admitting: Radiation Oncology

## 2022-12-27 ENCOUNTER — Other Ambulatory Visit: Payer: Self-pay

## 2022-12-27 DIAGNOSIS — Z170 Estrogen receptor positive status [ER+]: Secondary | ICD-10-CM | POA: Diagnosis not present

## 2022-12-27 DIAGNOSIS — Z5111 Encounter for antineoplastic chemotherapy: Secondary | ICD-10-CM | POA: Diagnosis not present

## 2022-12-27 DIAGNOSIS — Z5112 Encounter for antineoplastic immunotherapy: Secondary | ICD-10-CM | POA: Diagnosis not present

## 2022-12-27 DIAGNOSIS — C50412 Malignant neoplasm of upper-outer quadrant of left female breast: Secondary | ICD-10-CM | POA: Diagnosis not present

## 2022-12-27 DIAGNOSIS — Z510 Encounter for antineoplastic radiation therapy: Secondary | ICD-10-CM | POA: Diagnosis not present

## 2022-12-27 LAB — RAD ONC ARIA SESSION SUMMARY
Course Elapsed Days: 9
Plan Fractions Treated to Date: 8
Plan Prescribed Dose Per Fraction: 2.66 Gy
Plan Total Fractions Prescribed: 16
Plan Total Prescribed Dose: 42.56 Gy
Reference Point Dosage Given to Date: 21.28 Gy
Reference Point Session Dosage Given: 2.66 Gy
Session Number: 8

## 2022-12-28 ENCOUNTER — Other Ambulatory Visit: Payer: Self-pay

## 2022-12-28 ENCOUNTER — Telehealth: Payer: Self-pay | Admitting: Hematology and Oncology

## 2022-12-28 ENCOUNTER — Ambulatory Visit
Admission: RE | Admit: 2022-12-28 | Discharge: 2022-12-28 | Disposition: A | Discharge status: Home / Self Care | Payer: 59 | Source: Ambulatory Visit | Attending: Radiation Oncology | Admitting: Radiation Oncology

## 2022-12-28 DIAGNOSIS — Z5111 Encounter for antineoplastic chemotherapy: Secondary | ICD-10-CM | POA: Diagnosis not present

## 2022-12-28 DIAGNOSIS — C50412 Malignant neoplasm of upper-outer quadrant of left female breast: Secondary | ICD-10-CM | POA: Diagnosis not present

## 2022-12-28 DIAGNOSIS — Z170 Estrogen receptor positive status [ER+]: Secondary | ICD-10-CM | POA: Diagnosis not present

## 2022-12-28 DIAGNOSIS — Z5112 Encounter for antineoplastic immunotherapy: Secondary | ICD-10-CM | POA: Diagnosis not present

## 2022-12-28 DIAGNOSIS — Z510 Encounter for antineoplastic radiation therapy: Secondary | ICD-10-CM | POA: Diagnosis not present

## 2022-12-28 LAB — RAD ONC ARIA SESSION SUMMARY
Course Elapsed Days: 10
Plan Fractions Treated to Date: 9
Plan Prescribed Dose Per Fraction: 2.66 Gy
Plan Total Fractions Prescribed: 16
Plan Total Prescribed Dose: 42.56 Gy
Reference Point Dosage Given to Date: 23.94 Gy
Reference Point Session Dosage Given: 2.66 Gy
Session Number: 9

## 2022-12-28 NOTE — Telephone Encounter (Signed)
Rescheduled appointment per staff message. Patient is aware of the changes made to her upcoming appointments. 

## 2022-12-29 ENCOUNTER — Ambulatory Visit
Admission: RE | Admit: 2022-12-29 | Discharge: 2022-12-29 | Disposition: A | Discharge status: Home / Self Care | Payer: 59 | Source: Ambulatory Visit | Attending: Radiation Oncology | Admitting: Radiation Oncology

## 2022-12-29 ENCOUNTER — Ambulatory Visit (HOSPITAL_BASED_OUTPATIENT_CLINIC_OR_DEPARTMENT_OTHER)
Admission: RE | Admit: 2022-12-29 | Discharge: 2022-12-29 | Disposition: A | Discharge status: Home / Self Care | Payer: 59 | Source: Ambulatory Visit | Attending: Adult Health | Admitting: Adult Health

## 2022-12-29 ENCOUNTER — Other Ambulatory Visit: Payer: Self-pay

## 2022-12-29 DIAGNOSIS — Z5111 Encounter for antineoplastic chemotherapy: Secondary | ICD-10-CM | POA: Diagnosis not present

## 2022-12-29 DIAGNOSIS — Z510 Encounter for antineoplastic radiation therapy: Secondary | ICD-10-CM | POA: Diagnosis not present

## 2022-12-29 DIAGNOSIS — Z5112 Encounter for antineoplastic immunotherapy: Secondary | ICD-10-CM | POA: Diagnosis not present

## 2022-12-29 DIAGNOSIS — C50412 Malignant neoplasm of upper-outer quadrant of left female breast: Secondary | ICD-10-CM | POA: Insufficient documentation

## 2022-12-29 DIAGNOSIS — R0600 Dyspnea, unspecified: Secondary | ICD-10-CM | POA: Insufficient documentation

## 2022-12-29 DIAGNOSIS — Z170 Estrogen receptor positive status [ER+]: Secondary | ICD-10-CM

## 2022-12-29 DIAGNOSIS — Z0189 Encounter for other specified special examinations: Secondary | ICD-10-CM

## 2022-12-29 LAB — RAD ONC ARIA SESSION SUMMARY
Course Elapsed Days: 11
Plan Fractions Treated to Date: 10
Plan Prescribed Dose Per Fraction: 2.66 Gy
Plan Total Fractions Prescribed: 16
Plan Total Prescribed Dose: 42.56 Gy
Reference Point Dosage Given to Date: 26.6 Gy
Reference Point Session Dosage Given: 2.66 Gy
Session Number: 10

## 2022-12-29 LAB — ECHOCARDIOGRAM COMPLETE
AR max vel: 2.72 cm2
AV Area VTI: 2.59 cm2
AV Area mean vel: 2.55 cm2
AV Mean grad: 3.5 mmHg
AV Peak grad: 5.9 mmHg
Ao pk vel: 1.22 m/s
Area-P 1/2: 3.56 cm2
Calc EF: 55.9 %
MV VTI: 3.37 cm2
S' Lateral: 3.6 cm
Single Plane A2C EF: 54 %
Single Plane A4C EF: 55.2 %

## 2023-01-01 ENCOUNTER — Other Ambulatory Visit: Payer: Self-pay

## 2023-01-01 ENCOUNTER — Ambulatory Visit
Admission: RE | Admit: 2023-01-01 | Discharge: 2023-01-01 | Disposition: A | Discharge status: Home / Self Care | Payer: 59 | Source: Ambulatory Visit | Attending: Radiation Oncology | Admitting: Radiation Oncology

## 2023-01-01 DIAGNOSIS — Z510 Encounter for antineoplastic radiation therapy: Secondary | ICD-10-CM | POA: Diagnosis not present

## 2023-01-01 DIAGNOSIS — Z170 Estrogen receptor positive status [ER+]: Secondary | ICD-10-CM | POA: Diagnosis not present

## 2023-01-01 DIAGNOSIS — C50412 Malignant neoplasm of upper-outer quadrant of left female breast: Secondary | ICD-10-CM | POA: Diagnosis not present

## 2023-01-01 DIAGNOSIS — Z5111 Encounter for antineoplastic chemotherapy: Secondary | ICD-10-CM | POA: Diagnosis not present

## 2023-01-01 DIAGNOSIS — Z5112 Encounter for antineoplastic immunotherapy: Secondary | ICD-10-CM | POA: Diagnosis not present

## 2023-01-01 LAB — RAD ONC ARIA SESSION SUMMARY
Course Elapsed Days: 14
Plan Fractions Treated to Date: 11
Plan Prescribed Dose Per Fraction: 2.66 Gy
Plan Total Fractions Prescribed: 16
Plan Total Prescribed Dose: 42.56 Gy
Reference Point Dosage Given to Date: 29.26 Gy
Reference Point Session Dosage Given: 2.66 Gy
Session Number: 11

## 2023-01-02 ENCOUNTER — Ambulatory Visit: Admission: RE | Admit: 2023-01-02 | Payer: 59 | Source: Ambulatory Visit

## 2023-01-03 ENCOUNTER — Other Ambulatory Visit: Payer: Self-pay

## 2023-01-03 ENCOUNTER — Ambulatory Visit
Admission: RE | Admit: 2023-01-03 | Discharge: 2023-01-03 | Disposition: A | Discharge status: Home / Self Care | Payer: 59 | Source: Ambulatory Visit | Attending: Radiation Oncology | Admitting: Radiation Oncology

## 2023-01-03 DIAGNOSIS — Z170 Estrogen receptor positive status [ER+]: Secondary | ICD-10-CM | POA: Diagnosis not present

## 2023-01-03 DIAGNOSIS — C50412 Malignant neoplasm of upper-outer quadrant of left female breast: Secondary | ICD-10-CM | POA: Diagnosis not present

## 2023-01-03 DIAGNOSIS — Z5111 Encounter for antineoplastic chemotherapy: Secondary | ICD-10-CM | POA: Diagnosis not present

## 2023-01-03 DIAGNOSIS — Z510 Encounter for antineoplastic radiation therapy: Secondary | ICD-10-CM | POA: Diagnosis not present

## 2023-01-03 DIAGNOSIS — Z5112 Encounter for antineoplastic immunotherapy: Secondary | ICD-10-CM | POA: Diagnosis not present

## 2023-01-03 LAB — RAD ONC ARIA SESSION SUMMARY
Course Elapsed Days: 16
Plan Fractions Treated to Date: 12
Plan Prescribed Dose Per Fraction: 2.66 Gy
Plan Total Fractions Prescribed: 16
Plan Total Prescribed Dose: 42.56 Gy
Reference Point Dosage Given to Date: 31.92 Gy
Reference Point Session Dosage Given: 2.66 Gy
Session Number: 12

## 2023-01-04 ENCOUNTER — Ambulatory Visit
Admission: RE | Admit: 2023-01-04 | Discharge: 2023-01-04 | Disposition: A | Discharge status: Home / Self Care | Payer: 59 | Source: Ambulatory Visit | Attending: Radiation Oncology | Admitting: Radiation Oncology

## 2023-01-04 ENCOUNTER — Other Ambulatory Visit: Payer: Self-pay

## 2023-01-04 DIAGNOSIS — C50412 Malignant neoplasm of upper-outer quadrant of left female breast: Secondary | ICD-10-CM | POA: Diagnosis not present

## 2023-01-04 DIAGNOSIS — Z5112 Encounter for antineoplastic immunotherapy: Secondary | ICD-10-CM | POA: Diagnosis not present

## 2023-01-04 DIAGNOSIS — Z5111 Encounter for antineoplastic chemotherapy: Secondary | ICD-10-CM | POA: Diagnosis not present

## 2023-01-04 DIAGNOSIS — Z510 Encounter for antineoplastic radiation therapy: Secondary | ICD-10-CM | POA: Diagnosis not present

## 2023-01-04 DIAGNOSIS — Z170 Estrogen receptor positive status [ER+]: Secondary | ICD-10-CM | POA: Diagnosis not present

## 2023-01-04 LAB — RAD ONC ARIA SESSION SUMMARY
Course Elapsed Days: 17
Plan Fractions Treated to Date: 13
Plan Prescribed Dose Per Fraction: 2.66 Gy
Plan Total Fractions Prescribed: 16
Plan Total Prescribed Dose: 42.56 Gy
Reference Point Dosage Given to Date: 34.58 Gy
Reference Point Session Dosage Given: 2.66 Gy
Session Number: 13

## 2023-01-05 ENCOUNTER — Ambulatory Visit
Admission: RE | Admit: 2023-01-05 | Discharge: 2023-01-05 | Disposition: A | Discharge status: Home / Self Care | Payer: 59 | Source: Ambulatory Visit | Attending: Radiation Oncology | Admitting: Radiation Oncology

## 2023-01-05 ENCOUNTER — Other Ambulatory Visit: Payer: Self-pay

## 2023-01-05 DIAGNOSIS — Z5111 Encounter for antineoplastic chemotherapy: Secondary | ICD-10-CM | POA: Diagnosis not present

## 2023-01-05 DIAGNOSIS — Z170 Estrogen receptor positive status [ER+]: Secondary | ICD-10-CM | POA: Diagnosis not present

## 2023-01-05 DIAGNOSIS — Z5112 Encounter for antineoplastic immunotherapy: Secondary | ICD-10-CM | POA: Diagnosis not present

## 2023-01-05 DIAGNOSIS — C50412 Malignant neoplasm of upper-outer quadrant of left female breast: Secondary | ICD-10-CM | POA: Diagnosis not present

## 2023-01-05 DIAGNOSIS — Z510 Encounter for antineoplastic radiation therapy: Secondary | ICD-10-CM | POA: Diagnosis not present

## 2023-01-05 LAB — RAD ONC ARIA SESSION SUMMARY
Course Elapsed Days: 18
Plan Fractions Treated to Date: 14
Plan Prescribed Dose Per Fraction: 2.66 Gy
Plan Total Fractions Prescribed: 16
Plan Total Prescribed Dose: 42.56 Gy
Reference Point Dosage Given to Date: 37.24 Gy
Reference Point Session Dosage Given: 2.66 Gy
Session Number: 14

## 2023-01-08 ENCOUNTER — Ambulatory Visit: Payer: 59

## 2023-01-08 ENCOUNTER — Other Ambulatory Visit: Payer: Self-pay

## 2023-01-08 ENCOUNTER — Ambulatory Visit
Admission: RE | Admit: 2023-01-08 | Discharge: 2023-01-08 | Disposition: A | Discharge status: Home / Self Care | Payer: 59 | Source: Ambulatory Visit | Attending: Radiation Oncology | Admitting: Radiation Oncology

## 2023-01-08 DIAGNOSIS — Z8041 Family history of malignant neoplasm of ovary: Secondary | ICD-10-CM | POA: Insufficient documentation

## 2023-01-08 DIAGNOSIS — R0602 Shortness of breath: Secondary | ICD-10-CM | POA: Insufficient documentation

## 2023-01-08 DIAGNOSIS — Z510 Encounter for antineoplastic radiation therapy: Secondary | ICD-10-CM | POA: Diagnosis not present

## 2023-01-08 DIAGNOSIS — Z87442 Personal history of urinary calculi: Secondary | ICD-10-CM | POA: Insufficient documentation

## 2023-01-08 DIAGNOSIS — Z803 Family history of malignant neoplasm of breast: Secondary | ICD-10-CM | POA: Insufficient documentation

## 2023-01-08 DIAGNOSIS — K219 Gastro-esophageal reflux disease without esophagitis: Secondary | ICD-10-CM | POA: Insufficient documentation

## 2023-01-08 DIAGNOSIS — C50412 Malignant neoplasm of upper-outer quadrant of left female breast: Secondary | ICD-10-CM | POA: Insufficient documentation

## 2023-01-08 DIAGNOSIS — Z5111 Encounter for antineoplastic chemotherapy: Secondary | ICD-10-CM | POA: Diagnosis not present

## 2023-01-08 DIAGNOSIS — M797 Fibromyalgia: Secondary | ICD-10-CM | POA: Insufficient documentation

## 2023-01-08 DIAGNOSIS — Z5112 Encounter for antineoplastic immunotherapy: Secondary | ICD-10-CM | POA: Diagnosis not present

## 2023-01-08 DIAGNOSIS — Z170 Estrogen receptor positive status [ER+]: Secondary | ICD-10-CM | POA: Insufficient documentation

## 2023-01-08 LAB — RAD ONC ARIA SESSION SUMMARY
Course Elapsed Days: 21
Plan Fractions Treated to Date: 15
Plan Prescribed Dose Per Fraction: 2.66 Gy
Plan Total Fractions Prescribed: 16
Plan Total Prescribed Dose: 42.56 Gy
Reference Point Dosage Given to Date: 39.9 Gy
Reference Point Session Dosage Given: 2.66 Gy
Session Number: 15

## 2023-01-09 ENCOUNTER — Inpatient Hospital Stay: Payer: 59

## 2023-01-09 ENCOUNTER — Other Ambulatory Visit: Payer: Self-pay

## 2023-01-09 ENCOUNTER — Inpatient Hospital Stay: Payer: 59 | Attending: Hematology and Oncology

## 2023-01-09 ENCOUNTER — Other Ambulatory Visit: Payer: 59

## 2023-01-09 ENCOUNTER — Ambulatory Visit: Payer: 59 | Admitting: Hematology and Oncology

## 2023-01-09 ENCOUNTER — Ambulatory Visit
Admission: RE | Admit: 2023-01-09 | Discharge: 2023-01-09 | Disposition: A | Discharge status: Home / Self Care | Payer: 59 | Source: Ambulatory Visit | Attending: Radiation Oncology | Admitting: Radiation Oncology

## 2023-01-09 ENCOUNTER — Ambulatory Visit: Payer: 59

## 2023-01-09 VITALS — BP 103/67 | HR 69 | Temp 96.9°F | Resp 18

## 2023-01-09 DIAGNOSIS — Z8041 Family history of malignant neoplasm of ovary: Secondary | ICD-10-CM | POA: Diagnosis not present

## 2023-01-09 DIAGNOSIS — Z5112 Encounter for antineoplastic immunotherapy: Secondary | ICD-10-CM | POA: Diagnosis not present

## 2023-01-09 DIAGNOSIS — Z87442 Personal history of urinary calculi: Secondary | ICD-10-CM | POA: Diagnosis not present

## 2023-01-09 DIAGNOSIS — K219 Gastro-esophageal reflux disease without esophagitis: Secondary | ICD-10-CM | POA: Insufficient documentation

## 2023-01-09 DIAGNOSIS — R0602 Shortness of breath: Secondary | ICD-10-CM | POA: Diagnosis not present

## 2023-01-09 DIAGNOSIS — C50412 Malignant neoplasm of upper-outer quadrant of left female breast: Secondary | ICD-10-CM | POA: Diagnosis not present

## 2023-01-09 DIAGNOSIS — M797 Fibromyalgia: Secondary | ICD-10-CM | POA: Insufficient documentation

## 2023-01-09 DIAGNOSIS — Z5111 Encounter for antineoplastic chemotherapy: Secondary | ICD-10-CM | POA: Diagnosis not present

## 2023-01-09 DIAGNOSIS — Z170 Estrogen receptor positive status [ER+]: Secondary | ICD-10-CM | POA: Diagnosis not present

## 2023-01-09 DIAGNOSIS — Z803 Family history of malignant neoplasm of breast: Secondary | ICD-10-CM | POA: Insufficient documentation

## 2023-01-09 DIAGNOSIS — Z510 Encounter for antineoplastic radiation therapy: Secondary | ICD-10-CM | POA: Diagnosis not present

## 2023-01-09 LAB — RAD ONC ARIA SESSION SUMMARY
Course Elapsed Days: 22
Plan Fractions Treated to Date: 16
Plan Prescribed Dose Per Fraction: 2.66 Gy
Plan Total Fractions Prescribed: 16
Plan Total Prescribed Dose: 42.56 Gy
Reference Point Dosage Given to Date: 42.56 Gy
Reference Point Session Dosage Given: 2.66 Gy
Session Number: 16

## 2023-01-09 LAB — CBC WITH DIFFERENTIAL (CANCER CENTER ONLY)
Abs Immature Granulocytes: 0.01 10*3/uL (ref 0.00–0.07)
Basophils Absolute: 0 10*3/uL (ref 0.0–0.1)
Basophils Relative: 0 %
Eosinophils Absolute: 0.1 10*3/uL (ref 0.0–0.5)
Eosinophils Relative: 3 %
HCT: 30.5 % — ABNORMAL LOW (ref 36.0–46.0)
Hemoglobin: 10.6 g/dL — ABNORMAL LOW (ref 12.0–15.0)
Immature Granulocytes: 0 %
Lymphocytes Relative: 26 %
Lymphs Abs: 0.9 10*3/uL (ref 0.7–4.0)
MCH: 32.4 pg (ref 26.0–34.0)
MCHC: 34.8 g/dL (ref 30.0–36.0)
MCV: 93.3 fL (ref 80.0–100.0)
Monocytes Absolute: 0.2 10*3/uL (ref 0.1–1.0)
Monocytes Relative: 7 %
Neutro Abs: 2.2 10*3/uL (ref 1.7–7.7)
Neutrophils Relative %: 64 %
Platelet Count: 136 10*3/uL — ABNORMAL LOW (ref 150–400)
RBC: 3.27 MIL/uL — ABNORMAL LOW (ref 3.87–5.11)
RDW: 12.8 % (ref 11.5–15.5)
WBC Count: 3.3 10*3/uL — ABNORMAL LOW (ref 4.0–10.5)
nRBC: 0 % (ref 0.0–0.2)

## 2023-01-09 MED ORDER — SODIUM CHLORIDE 0.9 % IV SOLN
420.0000 mg | Freq: Once | INTRAVENOUS | Status: AC
  Administered 2023-01-09: 420 mg via INTRAVENOUS
  Filled 2023-01-09: qty 14

## 2023-01-09 MED ORDER — HEPARIN SOD (PORK) LOCK FLUSH 100 UNIT/ML IV SOLN
500.0000 [IU] | Freq: Once | INTRAVENOUS | Status: AC | PRN
  Administered 2023-01-09: 500 [IU]

## 2023-01-09 MED ORDER — ACETAMINOPHEN 325 MG PO TABS
650.0000 mg | ORAL_TABLET | Freq: Once | ORAL | Status: AC
  Administered 2023-01-09: 650 mg via ORAL
  Filled 2023-01-09: qty 2

## 2023-01-09 MED ORDER — DIPHENHYDRAMINE HCL 25 MG PO CAPS
50.0000 mg | ORAL_CAPSULE | Freq: Once | ORAL | Status: AC
  Administered 2023-01-09: 50 mg via ORAL
  Filled 2023-01-09: qty 2

## 2023-01-09 MED ORDER — SODIUM CHLORIDE 0.9 % IV SOLN: Freq: Once | INTRAVENOUS | Status: AC

## 2023-01-09 MED ORDER — SODIUM CHLORIDE 0.9% FLUSH
10.0000 mL | INTRAVENOUS | Status: DC | PRN
  Administered 2023-01-09: 10 mL

## 2023-01-09 MED ORDER — TRASTUZUMAB-ANNS CHEMO 150 MG IV SOLR
6.0000 mg/kg | Freq: Once | INTRAVENOUS | Status: AC
  Administered 2023-01-09: 420 mg via INTRAVENOUS
  Filled 2023-01-09: qty 20

## 2023-01-09 NOTE — Patient Instructions (Signed)
[facility]  Discharge Instructions: Thank you for choosing [facility] to provide your oncology and hematology care.   If you have a lab appointment with the Cancer Center, please go directly to the Cancer Center and check in at the registration area.   Wear comfortable clothing and clothing appropriate for easy access to any Portacath or PICC line.   We strive to give you quality time with your provider. You may need to reschedule your appointment if you arrive late (15 or more minutes).  Arriving late affects you and other patients whose appointments are after yours.  Also, if you miss three or more appointments without notifying the office, you may be dismissed from the clinic at the provider's discretion.      For prescription refill requests, have your pharmacy contact our office and allow 72 hours for refills to be completed.    Today you received the following chemotherapy and/or immunotherapy agents herceptin perjeta      To help prevent nausea and vomiting after your treatment, we encourage you to take your nausea medication as directed.  BELOW ARE SYMPTOMS THAT SHOULD BE REPORTED IMMEDIATELY: *FEVER GREATER THAN 100.4 F (38 C) OR HIGHER *CHILLS OR SWEATING *NAUSEA AND VOMITING THAT IS NOT CONTROLLED WITH YOUR NAUSEA MEDICATION *UNUSUAL SHORTNESS OF BREATH *UNUSUAL BRUISING OR BLEEDING *URINARY PROBLEMS (pain or burning when urinating, or frequent urination) *BOWEL PROBLEMS (unusual diarrhea, constipation, pain near the anus) TENDERNESS IN MOUTH AND THROAT WITH OR WITHOUT PRESENCE OF ULCERS (sore throat, sores in mouth, or a toothache) UNUSUAL RASH, SWELLING OR PAIN  UNUSUAL VAGINAL DISCHARGE OR ITCHING   Items with * indicate a potential emergency and should be followed up as soon as possible or go to the Emergency Department if any problems should occur.  Please show the CHEMOTHERAPY ALERT CARD or IMMUNOTHERAPY ALERT CARD  at check-in to the Emergency Department and triage nurse.  Should you have questions after your visit or need to cancel or reschedule your appointment, please contact [facility]  Dept: [phone]  and follow the prompts.  Office hours are 8:00 a.m. to 4:30 p.m. Monday - Friday. Please note that voicemails left after 4:00 p.m. may not be returned until the following business day.  We are closed weekends and major holidays. You have access to a nurse at all times for urgent questions. Please call the main number to the clinic Dept: [phone] and follow the prompts.   For any non-urgent questions, you may also contact your provider using MyChart. We now offer e-Visits for anyone 18 and older to request care online for non-urgent symptoms. For details visit [email or web address].   Also download the MyChart app! Go to the app store, search "MyChart", open the app, select [facility], and log in with your MyChart username and password.   

## 2023-01-10 ENCOUNTER — Other Ambulatory Visit: Payer: Self-pay

## 2023-01-10 ENCOUNTER — Ambulatory Visit
Admission: RE | Admit: 2023-01-10 | Discharge: 2023-01-10 | Disposition: A | Discharge status: Home / Self Care | Payer: 59 | Source: Ambulatory Visit | Attending: Radiation Oncology | Admitting: Radiation Oncology

## 2023-01-10 DIAGNOSIS — R0602 Shortness of breath: Secondary | ICD-10-CM | POA: Diagnosis not present

## 2023-01-10 DIAGNOSIS — Z5112 Encounter for antineoplastic immunotherapy: Secondary | ICD-10-CM | POA: Diagnosis not present

## 2023-01-10 DIAGNOSIS — Z8041 Family history of malignant neoplasm of ovary: Secondary | ICD-10-CM | POA: Diagnosis not present

## 2023-01-10 DIAGNOSIS — M797 Fibromyalgia: Secondary | ICD-10-CM | POA: Diagnosis not present

## 2023-01-10 DIAGNOSIS — Z803 Family history of malignant neoplasm of breast: Secondary | ICD-10-CM | POA: Diagnosis not present

## 2023-01-10 DIAGNOSIS — K219 Gastro-esophageal reflux disease without esophagitis: Secondary | ICD-10-CM | POA: Diagnosis not present

## 2023-01-10 DIAGNOSIS — Z5111 Encounter for antineoplastic chemotherapy: Secondary | ICD-10-CM | POA: Diagnosis not present

## 2023-01-10 DIAGNOSIS — Z510 Encounter for antineoplastic radiation therapy: Secondary | ICD-10-CM | POA: Diagnosis not present

## 2023-01-10 DIAGNOSIS — C50412 Malignant neoplasm of upper-outer quadrant of left female breast: Secondary | ICD-10-CM | POA: Diagnosis not present

## 2023-01-10 DIAGNOSIS — Z87442 Personal history of urinary calculi: Secondary | ICD-10-CM | POA: Diagnosis not present

## 2023-01-10 DIAGNOSIS — Z170 Estrogen receptor positive status [ER+]: Secondary | ICD-10-CM | POA: Diagnosis not present

## 2023-01-10 LAB — RAD ONC ARIA SESSION SUMMARY
Course Elapsed Days: 23
Plan Fractions Treated to Date: 1
Plan Prescribed Dose Per Fraction: 2 Gy
Plan Total Fractions Prescribed: 4
Plan Total Prescribed Dose: 8 Gy
Reference Point Dosage Given to Date: 2 Gy
Reference Point Session Dosage Given: 2 Gy
Session Number: 17

## 2023-01-12 ENCOUNTER — Ambulatory Visit
Admission: RE | Admit: 2023-01-12 | Discharge: 2023-01-12 | Disposition: A | Discharge status: Home / Self Care | Payer: 59 | Source: Ambulatory Visit | Attending: Radiation Oncology | Admitting: Radiation Oncology

## 2023-01-12 ENCOUNTER — Other Ambulatory Visit: Payer: Self-pay

## 2023-01-12 DIAGNOSIS — Z5112 Encounter for antineoplastic immunotherapy: Secondary | ICD-10-CM | POA: Diagnosis not present

## 2023-01-12 DIAGNOSIS — Z8041 Family history of malignant neoplasm of ovary: Secondary | ICD-10-CM | POA: Diagnosis not present

## 2023-01-12 DIAGNOSIS — Z170 Estrogen receptor positive status [ER+]: Secondary | ICD-10-CM | POA: Diagnosis not present

## 2023-01-12 DIAGNOSIS — M797 Fibromyalgia: Secondary | ICD-10-CM | POA: Diagnosis not present

## 2023-01-12 DIAGNOSIS — K219 Gastro-esophageal reflux disease without esophagitis: Secondary | ICD-10-CM | POA: Diagnosis not present

## 2023-01-12 DIAGNOSIS — C50412 Malignant neoplasm of upper-outer quadrant of left female breast: Secondary | ICD-10-CM | POA: Diagnosis not present

## 2023-01-12 DIAGNOSIS — Z803 Family history of malignant neoplasm of breast: Secondary | ICD-10-CM | POA: Diagnosis not present

## 2023-01-12 DIAGNOSIS — R0602 Shortness of breath: Secondary | ICD-10-CM | POA: Diagnosis not present

## 2023-01-12 DIAGNOSIS — Z5111 Encounter for antineoplastic chemotherapy: Secondary | ICD-10-CM | POA: Diagnosis not present

## 2023-01-12 DIAGNOSIS — Z87442 Personal history of urinary calculi: Secondary | ICD-10-CM | POA: Diagnosis not present

## 2023-01-12 LAB — RAD ONC ARIA SESSION SUMMARY
Course Elapsed Days: 25
Plan Fractions Treated to Date: 2
Plan Prescribed Dose Per Fraction: 2 Gy
Plan Total Fractions Prescribed: 4
Plan Total Prescribed Dose: 8 Gy
Reference Point Dosage Given to Date: 4 Gy
Reference Point Session Dosage Given: 2 Gy
Session Number: 18

## 2023-01-15 ENCOUNTER — Ambulatory Visit: Payer: 59

## 2023-01-15 ENCOUNTER — Other Ambulatory Visit: Payer: Self-pay

## 2023-01-15 ENCOUNTER — Ambulatory Visit
Admission: RE | Admit: 2023-01-15 | Discharge: 2023-01-15 | Disposition: A | Discharge status: Home / Self Care | Payer: 59 | Source: Ambulatory Visit | Attending: Radiation Oncology | Admitting: Radiation Oncology

## 2023-01-15 DIAGNOSIS — R0602 Shortness of breath: Secondary | ICD-10-CM | POA: Diagnosis not present

## 2023-01-15 DIAGNOSIS — Z87442 Personal history of urinary calculi: Secondary | ICD-10-CM | POA: Diagnosis not present

## 2023-01-15 DIAGNOSIS — M797 Fibromyalgia: Secondary | ICD-10-CM | POA: Diagnosis not present

## 2023-01-15 DIAGNOSIS — Z803 Family history of malignant neoplasm of breast: Secondary | ICD-10-CM | POA: Diagnosis not present

## 2023-01-15 DIAGNOSIS — C50412 Malignant neoplasm of upper-outer quadrant of left female breast: Secondary | ICD-10-CM | POA: Diagnosis not present

## 2023-01-15 DIAGNOSIS — Z5112 Encounter for antineoplastic immunotherapy: Secondary | ICD-10-CM | POA: Diagnosis not present

## 2023-01-15 DIAGNOSIS — K219 Gastro-esophageal reflux disease without esophagitis: Secondary | ICD-10-CM | POA: Diagnosis not present

## 2023-01-15 DIAGNOSIS — Z170 Estrogen receptor positive status [ER+]: Secondary | ICD-10-CM | POA: Diagnosis not present

## 2023-01-15 DIAGNOSIS — Z5111 Encounter for antineoplastic chemotherapy: Secondary | ICD-10-CM | POA: Diagnosis not present

## 2023-01-15 DIAGNOSIS — Z8041 Family history of malignant neoplasm of ovary: Secondary | ICD-10-CM | POA: Diagnosis not present

## 2023-01-15 LAB — RAD ONC ARIA SESSION SUMMARY
Course Elapsed Days: 28
Plan Fractions Treated to Date: 3
Plan Prescribed Dose Per Fraction: 2 Gy
Plan Total Fractions Prescribed: 4
Plan Total Prescribed Dose: 8 Gy
Reference Point Dosage Given to Date: 6 Gy
Reference Point Session Dosage Given: 2 Gy
Session Number: 19

## 2023-01-16 ENCOUNTER — Ambulatory Visit: Payer: 59

## 2023-01-16 ENCOUNTER — Other Ambulatory Visit: Payer: Self-pay

## 2023-01-16 ENCOUNTER — Ambulatory Visit
Admission: RE | Admit: 2023-01-16 | Discharge: 2023-01-16 | Disposition: A | Discharge status: Home / Self Care | Payer: 59 | Source: Ambulatory Visit | Attending: Radiation Oncology | Admitting: Radiation Oncology

## 2023-01-16 DIAGNOSIS — Z170 Estrogen receptor positive status [ER+]: Secondary | ICD-10-CM | POA: Diagnosis not present

## 2023-01-16 DIAGNOSIS — Z8041 Family history of malignant neoplasm of ovary: Secondary | ICD-10-CM | POA: Diagnosis not present

## 2023-01-16 DIAGNOSIS — Z803 Family history of malignant neoplasm of breast: Secondary | ICD-10-CM | POA: Diagnosis not present

## 2023-01-16 DIAGNOSIS — R0602 Shortness of breath: Secondary | ICD-10-CM | POA: Diagnosis not present

## 2023-01-16 DIAGNOSIS — Z510 Encounter for antineoplastic radiation therapy: Secondary | ICD-10-CM | POA: Diagnosis not present

## 2023-01-16 DIAGNOSIS — Z5112 Encounter for antineoplastic immunotherapy: Secondary | ICD-10-CM | POA: Diagnosis not present

## 2023-01-16 DIAGNOSIS — Z87442 Personal history of urinary calculi: Secondary | ICD-10-CM | POA: Diagnosis not present

## 2023-01-16 DIAGNOSIS — C50412 Malignant neoplasm of upper-outer quadrant of left female breast: Secondary | ICD-10-CM | POA: Diagnosis not present

## 2023-01-16 DIAGNOSIS — M797 Fibromyalgia: Secondary | ICD-10-CM | POA: Diagnosis not present

## 2023-01-16 DIAGNOSIS — Z5111 Encounter for antineoplastic chemotherapy: Secondary | ICD-10-CM | POA: Diagnosis not present

## 2023-01-16 DIAGNOSIS — K219 Gastro-esophageal reflux disease without esophagitis: Secondary | ICD-10-CM | POA: Diagnosis not present

## 2023-01-16 LAB — RAD ONC ARIA SESSION SUMMARY
Course Elapsed Days: 29
Plan Fractions Treated to Date: 4
Plan Prescribed Dose Per Fraction: 2 Gy
Plan Total Fractions Prescribed: 4
Plan Total Prescribed Dose: 8 Gy
Reference Point Dosage Given to Date: 8 Gy
Reference Point Session Dosage Given: 2 Gy
Session Number: 20

## 2023-01-17 ENCOUNTER — Ambulatory Visit: Payer: 59

## 2023-01-17 NOTE — Radiation Completion Notes (Addendum)
  Radiation Oncology         [phone] ________________________________  Name: [name] [name] MRN: [id number]  Date of Service: 01/16/2023  DOB: 10-Feb-1976  End of Treatment Note  Diagnosis: Stage IB, cT2N0M0, grade 2, Triple Positive invasive ductal carcinoma of the left breast with complete pathologic response to neoadjuvant chemotherapy   Intent: Curative     ==========DELIVERED PLANS==========  First Treatment Date: 2022-12-18 - Last Treatment Date: 2023-01-16   Plan Name: Breast_L_BH Site: Breast, Left Technique: 3D Mode: Photon Dose Per Fraction: 2.66 Gy Prescribed Dose (Delivered / Prescribed): 42.56 Gy / 42.56 Gy Prescribed Fxs (Delivered / Prescribed): 16 / 16   Plan Name: Brst_L_Bst_BH Site: Breast, Left Technique: 3D Mode: Photon Dose Per Fraction: 2 Gy Prescribed Dose (Delivered / Prescribed): 8 Gy / 8 Gy Prescribed Fxs (Delivered / Prescribed): 4 / 4     ==========ON TREATMENT VISIT DATES========== 2022-12-22, 2022-12-29, 2023-01-05, 2023-01-12    See weekly On Treatment Notes in Epic for details. The patient tolerated radiation. She developed fatigue and anticipated skin changes in the treatment field.   The patient will receive a call in about one month from the radiation oncology department. She will continue follow up with Dr. Al Pimple as well.      Osker Mason, PAC

## 2023-01-18 ENCOUNTER — Ambulatory Visit: Payer: 59

## 2023-01-19 ENCOUNTER — Other Ambulatory Visit: Payer: Self-pay

## 2023-01-19 ENCOUNTER — Ambulatory Visit: Payer: 59

## 2023-01-22 ENCOUNTER — Ambulatory Visit: Payer: 59

## 2023-01-23 ENCOUNTER — Ambulatory Visit: Payer: 59

## 2023-01-24 ENCOUNTER — Ambulatory Visit: Payer: 59

## 2023-01-25 ENCOUNTER — Ambulatory Visit: Payer: 59

## 2023-01-26 ENCOUNTER — Ambulatory Visit: Payer: 59

## 2023-01-27 ENCOUNTER — Telehealth: Payer: Self-pay | Admitting: Hematology and Oncology

## 2023-01-27 NOTE — Telephone Encounter (Signed)
Scheduled appointments per WQ. Patient is aware of the made appointments.  

## 2023-01-29 ENCOUNTER — Ambulatory Visit: Payer: 59

## 2023-01-29 ENCOUNTER — Other Ambulatory Visit: Payer: Self-pay

## 2023-01-30 ENCOUNTER — Encounter: Payer: Self-pay | Admitting: *Deleted

## 2023-01-30 ENCOUNTER — Inpatient Hospital Stay (HOSPITAL_BASED_OUTPATIENT_CLINIC_OR_DEPARTMENT_OTHER): Payer: 59 | Admitting: Hematology and Oncology

## 2023-01-30 ENCOUNTER — Inpatient Hospital Stay: Payer: 59

## 2023-01-30 ENCOUNTER — Ambulatory Visit: Payer: 59

## 2023-01-30 ENCOUNTER — Other Ambulatory Visit: Payer: Self-pay

## 2023-01-30 VITALS — BP 109/76 | HR 71 | Temp 97.8°F | Resp 18

## 2023-01-30 DIAGNOSIS — C50412 Malignant neoplasm of upper-outer quadrant of left female breast: Secondary | ICD-10-CM

## 2023-01-30 DIAGNOSIS — Z5112 Encounter for antineoplastic immunotherapy: Secondary | ICD-10-CM | POA: Diagnosis not present

## 2023-01-30 DIAGNOSIS — Z8041 Family history of malignant neoplasm of ovary: Secondary | ICD-10-CM | POA: Diagnosis not present

## 2023-01-30 DIAGNOSIS — Z170 Estrogen receptor positive status [ER+]: Secondary | ICD-10-CM

## 2023-01-30 DIAGNOSIS — Z803 Family history of malignant neoplasm of breast: Secondary | ICD-10-CM | POA: Diagnosis not present

## 2023-01-30 DIAGNOSIS — Z87442 Personal history of urinary calculi: Secondary | ICD-10-CM | POA: Diagnosis not present

## 2023-01-30 DIAGNOSIS — M797 Fibromyalgia: Secondary | ICD-10-CM | POA: Diagnosis not present

## 2023-01-30 DIAGNOSIS — Z95828 Presence of other vascular implants and grafts: Secondary | ICD-10-CM

## 2023-01-30 DIAGNOSIS — R0602 Shortness of breath: Secondary | ICD-10-CM | POA: Diagnosis not present

## 2023-01-30 DIAGNOSIS — Z5111 Encounter for antineoplastic chemotherapy: Secondary | ICD-10-CM | POA: Diagnosis not present

## 2023-01-30 DIAGNOSIS — K219 Gastro-esophageal reflux disease without esophagitis: Secondary | ICD-10-CM | POA: Diagnosis not present

## 2023-01-30 LAB — CMP (CANCER CENTER ONLY)
ALT: 8 U/L (ref 0–44)
AST: 11 U/L — ABNORMAL LOW (ref 15–41)
Albumin: 3.5 g/dL (ref 3.5–5.0)
Alkaline Phosphatase: 51 U/L (ref 38–126)
Anion gap: 6 (ref 5–15)
BUN: 9 mg/dL (ref 6–20)
CO2: 27 mmol/L (ref 22–32)
Calcium: 8.6 mg/dL — ABNORMAL LOW (ref 8.9–10.3)
Chloride: 107 mmol/L (ref 98–111)
Creatinine: 0.94 mg/dL (ref 0.44–1.00)
GFR, Estimated: >60 mL/min (ref >60)
Glucose, Bld: 101 mg/dL — ABNORMAL HIGH (ref 70–99)
Potassium: 3.3 mmol/L — ABNORMAL LOW (ref 3.5–5.1)
Sodium: 140 mmol/L (ref 135–145)
Total Bilirubin: 0.4 mg/dL (ref 0.3–1.2)
Total Protein: 5.7 g/dL — ABNORMAL LOW (ref 6.5–8.1)

## 2023-01-30 LAB — CBC WITH DIFFERENTIAL (CANCER CENTER ONLY)
Abs Immature Granulocytes: 0 10*3/uL (ref 0.00–0.07)
Basophils Absolute: 0 10*3/uL (ref 0.0–0.1)
Basophils Relative: 1 %
Eosinophils Absolute: 0.1 10*3/uL (ref 0.0–0.5)
Eosinophils Relative: 2 %
HCT: 30.1 % — ABNORMAL LOW (ref 36.0–46.0)
Hemoglobin: 10.4 g/dL — ABNORMAL LOW (ref 12.0–15.0)
Immature Granulocytes: 0 %
Lymphocytes Relative: 28 %
Lymphs Abs: 0.8 10*3/uL (ref 0.7–4.0)
MCH: 31.9 pg (ref 26.0–34.0)
MCHC: 34.6 g/dL (ref 30.0–36.0)
MCV: 92.3 fL (ref 80.0–100.0)
Monocytes Absolute: 0.2 10*3/uL (ref 0.1–1.0)
Monocytes Relative: 8 %
Neutro Abs: 1.7 10*3/uL (ref 1.7–7.7)
Neutrophils Relative %: 61 %
Platelet Count: 139 10*3/uL — ABNORMAL LOW (ref 150–400)
RBC: 3.26 MIL/uL — ABNORMAL LOW (ref 3.87–5.11)
RDW: 12.5 % (ref 11.5–15.5)
WBC Count: 2.7 10*3/uL — ABNORMAL LOW (ref 4.0–10.5)
nRBC: 0 % (ref 0.0–0.2)

## 2023-01-30 MED ORDER — SODIUM CHLORIDE 0.9 % IV SOLN
420.0000 mg | Freq: Once | INTRAVENOUS | Status: AC
  Administered 2023-01-30: 420 mg via INTRAVENOUS
  Filled 2023-01-30: qty 14

## 2023-01-30 MED ORDER — SODIUM CHLORIDE 0.9% FLUSH
10.0000 mL | INTRAVENOUS | Status: DC | PRN
  Administered 2023-01-30: 10 mL

## 2023-01-30 MED ORDER — DIPHENHYDRAMINE HCL 25 MG PO CAPS
50.0000 mg | ORAL_CAPSULE | Freq: Once | ORAL | Status: AC
  Administered 2023-01-30: 50 mg via ORAL
  Filled 2023-01-30: qty 2

## 2023-01-30 MED ORDER — SODIUM CHLORIDE 0.9% FLUSH
10.0000 mL | Freq: Once | INTRAVENOUS | Status: AC
  Administered 2023-01-30: 10 mL

## 2023-01-30 MED ORDER — SODIUM CHLORIDE 0.9 % IV SOLN: Freq: Once | INTRAVENOUS | Status: AC

## 2023-01-30 MED ORDER — HEPARIN SOD (PORK) LOCK FLUSH 100 UNIT/ML IV SOLN
500.0000 [IU] | Freq: Once | INTRAVENOUS | Status: AC | PRN
  Administered 2023-01-30: 500 [IU]

## 2023-01-30 MED ORDER — TRASTUZUMAB-ANNS CHEMO 150 MG IV SOLR
6.0000 mg/kg | Freq: Once | INTRAVENOUS | Status: AC
  Administered 2023-01-30: 420 mg via INTRAVENOUS
  Filled 2023-01-30: qty 20

## 2023-01-30 MED ORDER — ACETAMINOPHEN 325 MG PO TABS
650.0000 mg | ORAL_TABLET | Freq: Once | ORAL | Status: AC
  Administered 2023-01-30: 650 mg via ORAL
  Filled 2023-01-30: qty 2

## 2023-01-30 NOTE — Patient Instructions (Signed)
[facility]  Discharge Instructions: Thank you for choosing [facility] to provide your oncology and hematology care.   If you have a lab appointment with the [facility], please go directly to the [facility] and check in at the registration area.   Wear comfortable clothing and clothing appropriate for easy access to any Portacath or PICC line.   We strive to give you quality time with your provider. You may need to reschedule your appointment if you arrive late (15 or more minutes).  Arriving late affects you and other patients whose appointments are after yours.  Also, if you miss three or more appointments without notifying the office, you may be dismissed from the clinic at the provider's discretion.      For prescription refill requests, have your pharmacy contact our office and allow 72 hours for refills to be completed.    Today you received the following chemotherapy and/or immunotherapy agents herceptin, perjeta      To help prevent nausea and vomiting after your treatment, we encourage you to take your nausea medication as directed.  BELOW ARE SYMPTOMS THAT SHOULD BE REPORTED IMMEDIATELY: *FEVER GREATER THAN 100.4 F (38 C) OR HIGHER *CHILLS OR SWEATING *NAUSEA AND VOMITING THAT IS NOT CONTROLLED WITH YOUR NAUSEA MEDICATION *UNUSUAL SHORTNESS OF BREATH *UNUSUAL BRUISING OR BLEEDING *URINARY PROBLEMS (pain or burning when urinating, or frequent urination) *BOWEL PROBLEMS (unusual diarrhea, constipation, pain near the anus) TENDERNESS IN MOUTH AND THROAT WITH OR WITHOUT PRESENCE OF ULCERS (sore throat, sores in mouth, or a toothache) UNUSUAL RASH, SWELLING OR PAIN  UNUSUAL VAGINAL DISCHARGE OR ITCHING   Items with * indicate a potential emergency and should be followed up as soon as possible or go to the Emergency Department if any problems should occur.  Please show the CHEMOTHERAPY ALERT CARD or IMMUNOTHERAPY ALERT CARD  at check-in to the Emergency Department and triage nurse.  Should you have questions after your visit or need to cancel or reschedule your appointment, please contact [facility]  Dept: [phone]  and follow the prompts.  Office hours are 8:00 a.m. to 4:30 p.m. Monday - Friday. Please note that voicemails left after 4:00 p.m. may not be returned until the following business day.  We are closed weekends and major holidays. You have access to a nurse at all times for urgent questions. Please call the main number to the clinic Dept: [phone] and follow the prompts.   For any non-urgent questions, you may also contact your provider using MyChart. We now offer e-Visits for anyone 65 and older to request care online for non-urgent symptoms. For details visit [email or web address].   Also download the MyChart app! Go to the app store, search "MyChart", open the app, select [facility], and log in with your MyChart username and password.

## 2023-01-30 NOTE — Assessment & Plan Note (Signed)
This is a 47 year old premenopausal female patient with newly diagnosed T2 N0 M0 ER/PR and HER2 amplified invasive ductal carcinoma of the left breast referred to breast MDC for additional recommendations.  Given large tumor measuring almost 4 cm in longest dimension and HER2 amplification, we have discussed about neoadjuvant chemotherapy.  I have recommended neoadjuvant TCHP followed by surgery and adjuvant HER2 based therapy depending on response.  She had  pCR with neoadjuvant chemotherapy and is now on adjuvant Herceptin and pertuzumab.  She is now post radiation.  We have today discussed about adjuvant antiestrogen therapy options.  We have discussed multiple options for antiestrogen therapy including OFS with AI/Tam vs tamoxifen alone. She at this time wants to proceed with tamoxifen alone. We discussed the mechanism of action of Tamoxifen and adverse effects in detail including but not limited to post menopausal symptoms, DVT/PE, endometrial hyperplasia, endometrial carcinoma and benefit on bone density. She is willing to proceed with tamoxifen. RTC in 3/4 months with SCP  Rachel Moulds MD

## 2023-01-30 NOTE — Progress Notes (Signed)
[facility] Cancer Follow up:    [name] [name], [name], DO [address] [place] [address]   DIAGNOSIS:  Cancer Staging  Malignant neoplasm of upper-outer quadrant of left breast in female, estrogen receptor positive (HCC) Staging form: Breast, AJCC 8th Edition - Clinical stage from 05/08/2022: Stage IB (cT2, cN0, cM0, G2, ER+, PR+, HER2+) - Signed by Ronny Bacon, PA-C on 05/08/2022 Stage prefix: Initial diagnosis Method of lymph node assessment: Clinical Histologic grading system: 3 grade system - Pathologic stage from 11/07/2022: No Stage Recommended (ypT0, pN0, cM0) - Signed by Loa Socks, NP on 11/28/2022 Stage prefix: Post-therapy   SUMMARY OF ONCOLOGIC HISTORY: Oncology History  Malignant neoplasm of upper-outer quadrant of left breast in female, estrogen receptor positive (HCC)  04/25/2022 Imaging   Patient had diagnostic bilateral mammogram given palpable mass in the left breast.  This showed a 2.6 x 3.5 x 3 cm irregular mass as well as possible satellite mass which is indeterminate.  An ultrasound was recommended.  Ultrasound of the left breast mass showed 3.9 x 2.4 x 2.3 cm irregular mass with an irregular and spiculated margin at 3:00 middle depth 5 cm from the nipple, irregular mass is hypoechoic.  In the left breast at 1 o'clock position, 3 cm from the nipple there is a round mass measuring 0.3 x 0.2 x 0.3 cm.  No significant abnormalities were seen sonographically in the left axilla.  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  Left breast needle core biopsy at 1:00 3 cm from the nipple showed fibrocystic changes with usual ductal hyperplasia negative for malignancy.  Prognostic showed ER 95% PR 40%,  HER2 3+ KI of 50%   05/08/2022 Initial Diagnosis   Malignant neoplasm of upper-outer quadrant of left breast in female, estrogen receptor positive (HCC)   05/08/2022 Cancer Staging   Staging form: Breast, AJCC 8th Edition - Clinical stage from 05/08/2022: Stage IB (cT2, cN0, cM0, G2, ER+, PR+, HER2+) - Signed by Ronny Bacon, PA-C on 05/08/2022 Stage prefix: Initial diagnosis Method of lymph node assessment: Clinical Histologic grading system: 3 grade system   05/18/2022 - 09/16/2022 Chemotherapy   Patient is on Treatment Plan : BREAST  Docetaxel + Carboplatin + Trastuzumab + Pertuzumab  (TCHP) q21d      05/23/2022 Genetic Testing   Negative genetics for [facility] CustomNext-Cancer +RNAinsight Panel.  VUS in ATM at [name].[id number] ([id number]). Report date is 05/23/2022.   The CustomNext-Cancer+RNAinsight panel offered by [employer] includes sequencing and rearrangement analysis for the following 47 genes:  APC, ATM, AXIN2, BARD1, BMPR1A, BRCA1, BRCA2, BRIP1, CDH1, CDK4, CDKN2A, CHEK2, DICER1, EPCAM, GREM1, HOXB13, MEN1, MLH1, MSH2, MSH3, MSH6, MUTYH, NBN, NF1, NF2, NTHL1, PALB2, PMS2, POLD1, POLE, PTEN, RAD51C, RAD51D, RECQL, RET, SDHA, SDHAF2, SDHB, SDHC, SDHD, SMAD4, SMARCA4, STK11, TP53, TSC1, TSC2, and VHL.  RNA data is routinely analyzed for use in variant interpretation for all genes.   10/17/2022 -  Chemotherapy   Patient is on Treatment Plan : BREAST Trastuzumab + Pertuzumab q21d x 11 cycles     11/07/2022 Surgery   Left breast lumpectomy: No residual invasive carcinoma or DCIS.  3 SLN negative for cancer, ypT0,pN0   11/07/2022 Cancer Staging   Staging form: Breast, AJCC  8th Edition - Pathologic stage from 11/07/2022: No Stage Recommended (ypT0, pN0, cM0) - Signed by Loa Socks, NP on 11/28/2022 Stage prefix: Post-therapy     CURRENT THERAPY: Herceptin Perjeta  INTERVAL HISTORY:  [name] [name] 47 y.o. 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  Since her last visit she underwent left breast lumpectomy and there was no evidence of residual cancer in the breast or in any lymph nodes.  She is here to continue on Herceptin Perjeta.  She has completed radiation and tolerated it very well. She feels like her energy is back, weight is better. She feels well and is ready to go on tamoxifen. Rest of the pertinent 10 point ROS reviewed and neg.  Patient Active Problem List   Diagnosis Date Noted   Genetic testing 05/30/2022   Family history of breast cancer 05/10/2022   Family history of ovarian cancer 05/10/2022   Malignant neoplasm of upper-outer quadrant of left breast in female, estrogen receptor positive (HCC) 05/08/2022   Food impaction of esophagus    Schatzki's ring    Hx of migraine headaches 11/17/2013   Ureteral calculus 11/17/2013   GERD (gastroesophageal reflux disease) 11/17/2013   Esophageal obstruction due to food impaction 11/17/2013    is allergic to amoxicillin, coconut (cocos nucifera), penicillins, duloxetine, hydrocodone, hydrocodone-acetaminophen, and moxifloxacin.  MEDICAL HISTORY: Past Medical History:  Diagnosis Date   Anemia    history of   Breast cancer, left (HCC)    Dyspnea    even with speaking   Family history of adverse reaction to anesthesia    mother slow to awaken   Family history of breast cancer 05/10/2022   Family history of ovarian cancer 05/10/2022   Fibromyalgia    GERD (gastroesophageal reflux disease)    History of kidney stones    last stone 6 months ago    Migraine    Seasonal allergies    Tinnitus    Vertigo     SURGICAL HISTORY: Past Surgical History:  Procedure Laterality Date   ADENOIDECTOMY  1991   BREAST BIOPSY  11/06/2022   MM LT RADIOACTIVE SEED LOC MAMMO GUIDE 11/06/2022 [facility]   BREAST BIOPSY  11/06/2022   MM LT RADIOACTIVE SEED EA ADD LESION LOC MAMMO GUIDE 11/06/2022 [facility]   BREAST LUMPECTOMY WITH  RADIOACTIVE SEED AND SENTINEL LYMPH NODE BIOPSY Left 11/07/2022   Procedure: LEFT BREAST LUMPECTOMY WITH RADIOACTIVE SEEDX2 AND SENTINEL LYMPH NODE BIOPSY;  Surgeon: Abigail Miyamoto, MD;  Location: [facility];  Service: General;  Laterality: Left;   CYSTOSCOPY WITH RETROGRADE PYELOGRAM, URETEROSCOPY AND STENT PLACEMENT Left 02/22/2021   Procedure: CYSTOSCOPY WITH RETROGRADE PYELOGRAM, URETEROSCOPY AND STENT PLACEMENT;  Surgeon: Belva Agee, MD;  Location: [facility];  Service: Urology;  Laterality: Left;   CYSTOSCOPY/URETEROSCOPY/HOLMIUM LASER/STENT PLACEMENT Left 03/10/2021   Procedure: CYSTOSCOPY RETROGRADE, LEFT URETEROSCOPY/ STONE BASKETRY /HOLMIUM LASER/STENT EXCHANGE;  Surgeon: Crist Fat, MD;  Location: [facility];  Service: Urology;  Laterality: Left;   ESOPHAGOGASTRODUODENOSCOPY N/A 11/17/2013   Procedure: ESOPHAGOGASTRODUODENOSCOPY (EGD);  Surgeon: Rachael Fee, MD;  Location: [facility] END[facility]COPY;  Service: Endoscopy;  Laterality: N/A;   ESOPHAGOGASTRODUODENOSCOPY N/A 10/15/2015   Procedure: ESOPHAGOGASTRODUODENOSCOPY (EGD);  Surgeon: Iva Boop, MD;  Location: [facility];  Service: Endoscopy;  Laterality: N/A;   HOLMIUM LASER APPLICATION  03/10/2021   Procedure: HOLMIUM LASER APPLICATION;  Surgeon: Crist Fat, MD;  Location: [facility];  Service: Urology;;  NASAL SEPTOPLASTY W/ TURBINOPLASTY Bilateral 03/18/2018   Procedure: BILATERAL NASAL SEPTOPLASTY WITH TURBINATE REDUCTION;  Surgeon: Flo Shanks, MD;  Location: [facility];  Service: ENT;  Laterality: Bilateral;   PORTACATH PLACEMENT Right 05/17/2022   Procedure: PORT-A-CATH INSERTION WITH ULTRASOUND GUIDANCE;  Surgeon: Abigail Miyamoto, MD;  Location: [facility];  Service: General;  Laterality: Right;   TONSILLECTOMY  1991   TUBAL LIGATION  2005    SOCIAL HISTORY: Social History   Socioeconomic History   Marital status: Single    Spouse name: Not on file   Number of  children: Not on file   Years of education: Not on file   Highest education level: Not on file  Occupational History   Not on file  Tobacco Use   Smoking status: Never   Smokeless tobacco: Never  Vaping Use   Vaping status: Never Used  Substance and Sexual Activity   Alcohol use: No   Drug use: No   Sexual activity: Not on file  Other Topics Concern   Not on file  Social History Narrative   ** Merged History Encounter **       Social Determinants of Health   Financial Resource Strain: Low Risk  (05/10/2022)   Overall Financial Resource Strain (CARDIA)    Difficulty of Paying Living Expenses: Not very hard  Food Insecurity: No Food Insecurity (12/05/2022)   Hunger Vital Sign    Worried About Running Out of Food in the Last Year: Never true    Ran Out of Food in the Last Year: Never true  Transportation Needs: No Transportation Needs (12/05/2022)   PRAPARE - Administrator, Civil Service (Medical): No    Lack of Transportation (Non-Medical): No  Physical Activity: Not on file  Stress: Not on file  Social Connections: Not on file  Intimate Partner Violence: Not At Risk (12/05/2022)   Humiliation, Afraid, Rape, and Kick questionnaire    Fear of Current or Ex-Partner: No    Emotionally Abused: No    Physically Abused: No    Sexually Abused: No    FAMILY HISTORY: Family History  Problem Relation Age of Onset   Asthma Mother    Diabetes Mother    Colon polyps Mother    Ovarian cancer Maternal Grandmother 68   Diabetes Maternal Grandmother    Esophageal cancer Maternal Grandfather        d. 43   Breast cancer Other        MGM's sisters x2   Cervical cancer Cousin        mat cousin; dx <40    Review of Systems  Constitutional:  Negative for appetite change, chills, fatigue, fever and unexpected weight change.  HENT:   Negative for hearing loss, lump/mass and trouble swallowing.   Eyes:  Negative for eye problems and icterus.  Respiratory:  Negative for  chest tightness, cough and shortness of breath.   Cardiovascular:  Negative for chest pain, leg swelling and palpitations.  Gastrointestinal:  Negative for abdominal distention, abdominal pain, constipation, diarrhea, nausea and vomiting.  Endocrine: Negative for hot flashes.  Genitourinary:  Negative for difficulty urinating.   Musculoskeletal:  Negative for arthralgias.  Skin:  Negative for itching and rash.  Neurological:  Negative for dizziness, extremity weakness, headaches and numbness.  Hematological:  Negative for adenopathy. Does not bruise/bleed easily.  Psychiatric/Behavioral:  Negative for depression. The patient is not nervous/anxious.       PHYSICAL EXAMINATION     Vitals:  01/30/23 0812  BP: 113/64  Pulse: 76  Resp: 18  Temp: 97.7 F (36.5 C)  SpO2: 100%    Physical Exam Constitutional:      General: She is not in acute distress.    Appearance: Normal appearance. She is not toxic-appearing.  HENT:     Head: Normocephalic and atraumatic.  Eyes:     General: No scleral icterus. Cardiovascular:     Rate and Rhythm: Normal rate and regular rhythm.     Pulses: Normal pulses.     Heart sounds: Normal heart sounds.  Pulmonary:     Effort: Pulmonary effort is normal.     Breath sounds: Normal breath sounds.  Abdominal:     General: Abdomen is flat. Bowel sounds are normal. There is no distension.     Palpations: Abdomen is soft.     Tenderness: There is no abdominal tenderness.  Musculoskeletal:        General: No swelling.     Cervical back: Neck supple.  Lymphadenopathy:     Cervical: No cervical adenopathy.  Skin:    General: Skin is warm and dry.     Findings: No rash.  Neurological:     General: No focal deficit present.     Mental Status: She is alert.  Psychiatric:        Mood and Affect: Mood normal.        Behavior: Behavior normal.     LABORATORY DATA:  CBC    Component Value Date/Time   WBC 2.7 (L) 01/30/2023 0744   WBC 6.9  11/20/2022 1624   RBC 3.26 (L) 01/30/2023 0744   HGB 10.4 (L) 01/30/2023 0744   HCT 30.1 (L) 01/30/2023 0744   PLT 139 (L) 01/30/2023 0744   MCV 92.3 01/30/2023 0744   MCH 31.9 01/30/2023 0744   MCHC 34.6 01/30/2023 0744   RDW 12.5 01/30/2023 0744   LYMPHSABS 0.8 01/30/2023 0744   MONOABS 0.2 01/30/2023 0744   EOSABS 0.1 01/30/2023 0744   BASOSABS 0.0 01/30/2023 0744    CMP     Component Value Date/Time   NA 143 11/28/2022 0826   K 3.5 11/28/2022 0826   CL 108 11/28/2022 0826   CO2 27 11/28/2022 0826   GLUCOSE 98 11/28/2022 0826   BUN 13 11/28/2022 0826   CREATININE 1.05 (H) 11/28/2022 0826   CALCIUM 8.9 11/28/2022 0826   PROT 6.3 (L) 11/28/2022 0826   ALBUMIN 3.6 11/28/2022 0826   AST 9 (L) 11/28/2022 0826   ALT 6 11/28/2022 0826   ALKPHOS 57 11/28/2022 0826   BILITOT 0.3 11/28/2022 0826   GFRNONAA >60 11/28/2022 0826   GFRAA >90 11/04/2014 1100         ASSESSMENT and THERAPY PLAN:   Malignant neoplasm of upper-outer quadrant of left breast in female, estrogen receptor positive (HCC) This is a 47 year old premenopausal female patient with newly diagnosed T2 N0 M0 ER/PR and HER2 amplified invasive ductal carcinoma of the left breast referred to breast [facility] for additional recommendations.  Given large tumor measuring almost 4 cm in [name] dimension and HER2 amplification, we have discussed about neoadjuvant chemotherapy.  I have recommended neoadjuvant TCHP followed by surgery and adjuvant HER2 based therapy depending on response.  She had  pCR with neoadjuvant chemotherapy and is now on adjuvant Herceptin and pertuzumab.  She is now post radiation.  We have today discussed about adjuvant antiestrogen therapy options.  We have discussed multiple options for antiestrogen therapy including OFS with AI/Tam  vs tamoxifen alone. She at this time wants to proceed with tamoxifen alone. We discussed the mechanism of action of Tamoxifen and adverse effects in detail including but  not limited to post menopausal symptoms, DVT/PE, endometrial hyperplasia, endometrial carcinoma and benefit on bone density. She is willing to proceed with tamoxifen. RTC in 3/4 months with SCP  Rachel Moulds MD    All questions were answered. The patient knows to call the clinic with any problems, questions or concerns. We can certainly see the patient much sooner if necessary.  Total encounter time:20 minutes*in face-to-face visit time, chart review, care coordination, order entry, and documentation of the encounter time.  Tel. [phone]    Fax. [phone]  *Total Encounter Time as defined by the Centers for Medicare and Medicaid Services includes, in addition to the face-to-face time of a patient visit (documented in the note above) non-face-to-face time: obtaining and reviewing outside history, ordering and reviewing medications, tests or procedures, care coordination (communications with other health care professionals or caregivers) and documentation in the medical record.

## 2023-01-31 ENCOUNTER — Ambulatory Visit: Payer: 59

## 2023-02-01 ENCOUNTER — Ambulatory Visit: Payer: 59

## 2023-02-01 ENCOUNTER — Other Ambulatory Visit: Payer: Self-pay

## 2023-02-05 ENCOUNTER — Encounter: Payer: Self-pay | Admitting: Hematology and Oncology

## 2023-02-06 ENCOUNTER — Other Ambulatory Visit: Payer: Self-pay | Admitting: *Deleted

## 2023-02-06 ENCOUNTER — Encounter: Payer: Self-pay | Admitting: Hematology and Oncology

## 2023-02-06 MED ORDER — TAMOXIFEN CITRATE 20 MG PO TABS: 20.0000 mg | ORAL_TABLET | Freq: Every day | ORAL | 3 refills | Status: DC

## 2023-02-20 ENCOUNTER — Inpatient Hospital Stay: Payer: 59

## 2023-02-20 ENCOUNTER — Inpatient Hospital Stay: Payer: 59 | Admitting: Adult Health

## 2023-02-20 ENCOUNTER — Inpatient Hospital Stay: Payer: 59 | Attending: Hematology and Oncology

## 2023-02-20 ENCOUNTER — Encounter: Payer: Self-pay | Admitting: Adult Health

## 2023-02-20 ENCOUNTER — Other Ambulatory Visit: Payer: Self-pay

## 2023-02-20 ENCOUNTER — Inpatient Hospital Stay (HOSPITAL_BASED_OUTPATIENT_CLINIC_OR_DEPARTMENT_OTHER): Payer: 59 | Admitting: Adult Health

## 2023-02-20 VITALS — BP 103/68 | HR 74 | Temp 98.2°F | Resp 16 | Wt 165.2 lb

## 2023-02-20 DIAGNOSIS — C50412 Malignant neoplasm of upper-outer quadrant of left female breast: Secondary | ICD-10-CM | POA: Insufficient documentation

## 2023-02-20 DIAGNOSIS — Z79810 Long term (current) use of selective estrogen receptor modulators (SERMs): Secondary | ICD-10-CM | POA: Insufficient documentation

## 2023-02-20 DIAGNOSIS — Z170 Estrogen receptor positive status [ER+]: Secondary | ICD-10-CM

## 2023-02-20 DIAGNOSIS — Z5112 Encounter for antineoplastic immunotherapy: Secondary | ICD-10-CM | POA: Insufficient documentation

## 2023-02-20 DIAGNOSIS — Z5111 Encounter for antineoplastic chemotherapy: Secondary | ICD-10-CM | POA: Diagnosis not present

## 2023-02-20 LAB — CMP (CANCER CENTER ONLY)
ALT: 6 U/L (ref 0–44)
AST: 9 U/L — ABNORMAL LOW (ref 15–41)
Albumin: 3.5 g/dL (ref 3.5–5.0)
Alkaline Phosphatase: 48 U/L (ref 38–126)
Anion gap: 6 (ref 5–15)
BUN: 15 mg/dL (ref 6–20)
CO2: 27 mmol/L (ref 22–32)
Calcium: 8.3 mg/dL — ABNORMAL LOW (ref 8.9–10.3)
Chloride: 107 mmol/L (ref 98–111)
Creatinine: 1.02 mg/dL — ABNORMAL HIGH (ref 0.44–1.00)
GFR, Estimated: >60 mL/min (ref >60)
Glucose, Bld: 105 mg/dL — ABNORMAL HIGH (ref 70–99)
Potassium: 2.9 mmol/L — ABNORMAL LOW (ref 3.5–5.1)
Sodium: 140 mmol/L (ref 135–145)
Total Bilirubin: 0.6 mg/dL (ref 0.3–1.2)
Total Protein: 6 g/dL — ABNORMAL LOW (ref 6.5–8.1)

## 2023-02-20 LAB — CBC WITH DIFFERENTIAL (CANCER CENTER ONLY)
Abs Immature Granulocytes: 0.02 10*3/uL (ref 0.00–0.07)
Basophils Absolute: 0 10*3/uL (ref 0.0–0.1)
Basophils Relative: 1 %
Eosinophils Absolute: 0.1 10*3/uL (ref 0.0–0.5)
Eosinophils Relative: 1 %
HCT: 29.9 % — ABNORMAL LOW (ref 36.0–46.0)
Hemoglobin: 10.8 g/dL — ABNORMAL LOW (ref 12.0–15.0)
Immature Granulocytes: 1 %
Lymphocytes Relative: 17 %
Lymphs Abs: 0.7 10*3/uL (ref 0.7–4.0)
MCH: 33.1 pg (ref 26.0–34.0)
MCHC: 36.1 g/dL — ABNORMAL HIGH (ref 30.0–36.0)
MCV: 91.7 fL (ref 80.0–100.0)
Monocytes Absolute: 0.3 10*3/uL (ref 0.1–1.0)
Monocytes Relative: 8 %
Neutro Abs: 2.9 10*3/uL (ref 1.7–7.7)
Neutrophils Relative %: 72 %
Platelet Count: 139 10*3/uL — ABNORMAL LOW (ref 150–400)
RBC: 3.26 MIL/uL — ABNORMAL LOW (ref 3.87–5.11)
RDW: 12.4 % (ref 11.5–15.5)
WBC Count: 4 10*3/uL (ref 4.0–10.5)
nRBC: 0 % (ref 0.0–0.2)

## 2023-02-20 MED ORDER — DIPHENHYDRAMINE HCL 25 MG PO CAPS
50.0000 mg | ORAL_CAPSULE | Freq: Once | ORAL | Status: AC
  Administered 2023-02-20: 50 mg via ORAL
  Filled 2023-02-20: qty 2

## 2023-02-20 MED ORDER — SODIUM CHLORIDE 0.9 % IV SOLN
420.0000 mg | Freq: Once | INTRAVENOUS | Status: AC
  Administered 2023-02-20: 420 mg via INTRAVENOUS
  Filled 2023-02-20: qty 14

## 2023-02-20 MED ORDER — SODIUM CHLORIDE 0.9% FLUSH
10.0000 mL | Freq: Once | INTRAVENOUS | Status: AC
  Administered 2023-02-20: 10 mL

## 2023-02-20 MED ORDER — HEPARIN SOD (PORK) LOCK FLUSH 100 UNIT/ML IV SOLN
500.0000 [IU] | Freq: Once | INTRAVENOUS | Status: AC | PRN
  Administered 2023-02-20: 500 [IU]

## 2023-02-20 MED ORDER — TRASTUZUMAB-ANNS CHEMO 150 MG IV SOLR
6.0000 mg/kg | Freq: Once | INTRAVENOUS | Status: AC
  Administered 2023-02-20: 420 mg via INTRAVENOUS
  Filled 2023-02-20: qty 20

## 2023-02-20 MED ORDER — SODIUM CHLORIDE 0.9 % IV SOLN: Freq: Once | INTRAVENOUS | Status: AC

## 2023-02-20 MED ORDER — ACETAMINOPHEN 325 MG PO TABS
650.0000 mg | ORAL_TABLET | Freq: Once | ORAL | Status: AC
  Administered 2023-02-20: 650 mg via ORAL
  Filled 2023-02-20: qty 2

## 2023-02-20 MED ORDER — SODIUM CHLORIDE 0.9% FLUSH
10.0000 mL | INTRAVENOUS | Status: DC | PRN
  Administered 2023-02-20: 10 mL

## 2023-02-20 NOTE — Progress Notes (Signed)
Cancer Center Cancer Follow up:    [name], [name], DO [address] [place] [address]   DIAGNOSIS:  Cancer Staging  Malignant neoplasm of upper-outer quadrant of left breast in female, estrogen receptor positive (HCC) Staging form: Breast, AJCC 8th Edition - Clinical stage from 05/08/2022: Stage IB (cT2, cN0, cM0, G2, ER+, PR+, HER2+) - Signed by Ronny Bacon, PA-C on 05/08/2022 Stage prefix: Initial diagnosis Method of lymph node assessment: Clinical Histologic grading system: 3 grade system - Pathologic stage from 11/07/2022: No Stage Recommended (ypT0, pN0, cM0) - Signed by Loa Socks, NP on 11/28/2022 Stage prefix: Post-therapy   SUMMARY OF ONCOLOGIC HISTORY: Oncology History  Malignant neoplasm of upper-outer quadrant of left breast in female, estrogen receptor positive (HCC)  04/25/2022 Imaging   Patient had diagnostic bilateral mammogram given palpable mass in the left breast.  This showed a 2.6 x 3.5 x 3 cm irregular mass as well as possible satellite mass which is indeterminate.  An ultrasound was recommended.  Ultrasound of the left breast mass showed 3.9 x 2.4 x 2.3 cm irregular mass with an irregular and spiculated margin at 3:00 middle depth 5 cm from the nipple, irregular mass is hypoechoic.  In the left breast at 1 o'clock position, 3 cm from the nipple there is a round mass measuring 0.3 x 0.2 x 0.3 cm.  No significant abnormalities were seen sonographically in the left axilla.  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  Left breast needle core biopsy at 1:00 3 cm from the nipple showed fibrocystic changes with usual ductal hyperplasia negative for malignancy.  Prognostic showed ER 95% PR 40%,  HER2 3+ KI of 50%   05/08/2022 Cancer Staging   Staging form: Breast, AJCC 8th Edition - Clinical stage from 05/08/2022: Stage IB (cT2, cN0, cM0, G2, ER+, PR+, HER2+) - Signed by Ronny Bacon, PA-C on 05/08/2022 Stage prefix: Initial diagnosis Method of lymph node assessment: Clinical Histologic grading system: 3 grade system   05/18/2022 - 09/16/2022 Chemotherapy   Patient is on Treatment Plan : BREAST  Docetaxel + Carboplatin + Trastuzumab + Pertuzumab  (TCHP) q21d      05/23/2022 Genetic Testing   Negative genetics for [facility] CustomNext-Cancer +RNAinsight Panel.  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 Report date is 05/23/2022.   The CustomNext-Cancer+RNAinsight panel offered by [employer] includes sequencing and rearrangement analysis for the following 47 genes:  APC, ATM, AXIN2, BARD1, BMPR1A, BRCA1, BRCA2, BRIP1, CDH1, CDK4, CDKN2A, CHEK2, DICER1, EPCAM, GREM1, HOXB13, MEN1, MLH1, MSH2, MSH3, MSH6, MUTYH, NBN, NF1, NF2, NTHL1, PALB2, PMS2, POLD1, POLE, PTEN, RAD51C, RAD51D, RECQL, RET, SDHA, SDHAF2, SDHB, SDHC, SDHD, SMAD4, SMARCA4, STK11, TP53, TSC1, TSC2, and VHL.  RNA data is routinely analyzed for use in variant interpretation for all genes.   10/17/2022 -  Chemotherapy   Patient is on Treatment Plan : BREAST Trastuzumab + Pertuzumab q21d x 11 cycles     11/07/2022 Surgery   Left breast lumpectomy: No residual invasive carcinoma or DCIS.  3 SLN negative for cancer, ypT0,pN0   11/07/2022 Cancer Staging   Staging form: Breast, AJCC 8th Edition - Pathologic stage from 11/07/2022: No Stage Recommended (ypT0, pN0, cM0) - Signed by Loa Socks, NP on  11/28/2022 Stage prefix: Post-therapy   12/18/2022 - 01/16/2023 Radiation Therapy   Plan Name: Breast_L_BH Site: Breast, Left Technique: 3D Mode: Photon Dose Per Fraction: 2.66 Gy Prescribed Dose (Delivered / Prescribed): 42.56 Gy / 42.56 Gy Prescribed Fxs (Delivered / Prescribed): 16 / 16   Plan Name:  Brst_L_Bst_BH Site: Breast, Left Technique: 3D Mode: Photon Dose Per Fraction: 2 Gy Prescribed Dose (Delivered / Prescribed): 8 Gy / 8 Gy Prescribed Fxs (Delivered / Prescribed): 4 / 4     02/2023 -  Anti-estrogen oral therapy   Tamoxifen daily     CURRENT THERAPY: Tamoxifen/Herceptin/Perjeta  INTERVAL HISTORY: [name] 47 y.o. female returns for follow-up prior to receiving Herceptin Perjeta.  At her last visit she started tamoxifen.  She denies any hot flashes, vaginal discharge, or joint pains.  She does note some mild mood changes.  Her most recent echocardiogram occurred on December 29, 2022 demonstrating a left ventricular ejection fraction of 55 to 60%.  Her global longitudinal strain was normal.   Patient Active Problem List   Diagnosis Date Noted   Genetic testing 05/30/2022   Family history of breast cancer 05/10/2022   Family history of ovarian cancer 05/10/2022   Malignant neoplasm of upper-outer quadrant of left breast in female, estrogen receptor positive (HCC) 05/08/2022   Food impaction of esophagus    Schatzki's ring    Hx of migraine headaches 11/17/2013   Ureteral calculus 11/17/2013   GERD (gastroesophageal reflux disease) 11/17/2013   Esophageal obstruction due to food impaction 11/17/2013    is allergic to amoxicillin, coconut (cocos nucifera), penicillins, duloxetine, hydrocodone, hydrocodone-acetaminophen, and moxifloxacin.  MEDICAL HISTORY: Past Medical History:  Diagnosis Date   Anemia    history of   Breast cancer, left (HCC)    Dyspnea    even with speaking   Family history of adverse reaction to anesthesia    mother slow to awaken   Family history of breast cancer 05/10/2022   Family history of ovarian cancer 05/10/2022   Fibromyalgia    GERD (gastroesophageal reflux disease)    History of kidney stones    last stone 6 months ago    Migraine    Seasonal allergies    Tinnitus    Vertigo     SURGICAL HISTORY: Past Surgical  History:  Procedure Laterality Date   ADENOIDECTOMY  1991   BREAST BIOPSY  11/06/2022   MM LT RADIOACTIVE SEED LOC MAMMO GUIDE 11/06/2022 [facility]   BREAST BIOPSY  11/06/2022   MM LT RADIOACTIVE SEED EA ADD LESION LOC MAMMO GUIDE 11/06/2022 [facility]   BREAST LUMPECTOMY WITH RADIOACTIVE SEED AND SENTINEL LYMPH NODE BIOPSY Left 11/07/2022   Procedure: LEFT BREAST LUMPECTOMY WITH RADIOACTIVE SEEDX2 AND SENTINEL LYMPH NODE BIOPSY;  Surgeon: Abigail Miyamoto, MD;  Location: [facility];  Service: General;  Laterality: Left;   CYSTOSCOPY WITH RETROGRADE PYELOGRAM, URETEROSCOPY AND STENT PLACEMENT Left 02/22/2021   Procedure: CYSTOSCOPY WITH RETROGRADE PYELOGRAM, URETEROSCOPY AND STENT PLACEMENT;  Surgeon: Belva Agee, MD;  Location: [facility];  Service: Urology;  Laterality: Left;   CYSTOSCOPY/URETEROSCOPY/HOLMIUM LASER/STENT PLACEMENT Left 03/10/2021   Procedure: CYSTOSCOPY RETROGRADE, LEFT URETEROSCOPY/ STONE BASKETRY /HOLMIUM LASER/STENT EXCHANGE;  Surgeon: Crist Fat, MD;  Location: [facility];  Service: Urology;  Laterality: Left;   ESOPHAGOGASTRODUODENOSCOPY N/A 11/17/2013   Procedure: ESOPHAGOGASTRODUODENOSCOPY (EGD);  Surgeon: Rachael Fee, MD;  Location: [facility] ENDOSCOPY;  Service: Endoscopy;  Laterality: N/A;   ESOPHAGOGASTRODUODENOSCOPY N/A 10/15/2015   Procedure: ESOPHAGOGASTRODUODENOSCOPY (EGD);  Surgeon: Iva Boop, MD;  Location: [facility]COPY;  Service: Endoscopy;  Laterality: N/A;   HOLMIUM LASER APPLICATION  03/10/2021   Procedure: HOLMIUM LASER APPLICATION;  Surgeon: Crist Fat, MD;  Location[facility];  Service: Urology;;   NASAL SEPTOPLASTY W/ TURBINOPLASTY Bilateral 03/18/2018   Procedure: BILATERAL NASAL SEPTOPLASTY WITH TURBINATE REDUCTION;  Surgeon: Flo Shanks, MD;  Location: [facility];  Service: ENT;  Laterality: Bilateral;   PORTACATH PLACEMENT Right 05/17/2022   Procedure: PORT-A-CATH  INSERTION WITH ULTRASOUND GUIDANCE;  Surgeon: Abigail Miyamoto, MD;  Location: [facility];  Service: General;  Laterality: Right;   TONSILLECTOMY  1991   TUBAL LIGATION  2005    SOCIAL HISTORY: Social History   Socioeconomic History   Marital status: Single    Spouse name: Not on file   Number of children: Not on file   Years of education: Not on file   Highest education level: Not on file  Occupational History   Not on file  Tobacco Use   Smoking status: Never   Smokeless tobacco: Never  Vaping Use   Vaping status: Never Used  Substance and Sexual Activity   Alcohol use: No   Drug use: No   Sexual activity: Not on file  Other Topics Concern   Not on file  Social History Narrative   ** Merged History Encounter **       Social Determinants of Health   Financial Resource Strain: Low Risk  (05/10/2022)   Overall Financial Resource Strain (CARDIA)    Difficulty of Paying Living Expenses: Not very hard  Food Insecurity: No Food Insecurity (12/05/2022)   Hunger Vital Sign    Worried About Running Out of Food in the Last Year: Never true    Ran Out of Food in the Last Year: Never true  Transportation Needs: No Transportation Needs (12/05/2022)   PRAPARE - Administrator, Civil Service (Medical): No    Lack of Transportation (Non-Medical): No  Physical Activity: Not on file  Stress: Not on file  Social Connections: Not on file  Intimate Partner Violence: Not At Risk (12/05/2022)   Humiliation, Afraid, Rape, and Kick questionnaire    Fear of Current or Ex-Partner: No    Emotionally Abused: No    Physically Abused: No    Sexually Abused: No    FAMILY HISTORY: Family History  Problem Relation Age of Onset   Asthma Mother    Diabetes Mother    Colon polyps Mother    Ovarian cancer Maternal Grandmother 32   Diabetes Maternal Grandmother    Esophageal cancer Maternal Grandfather        d. 34   Breast cancer Other        MGM's sisters x2   Cervical cancer  Cousin        mat cousin; dx <40    Review of Systems  Constitutional:  Negative for appetite change, chills, fatigue, fever and unexpected weight change.  HENT:   Negative for hearing loss, lump/mass and trouble swallowing.   Eyes:  Negative for eye problems and icterus.  Respiratory:  Negative for chest tightness, cough and shortness of breath.   Cardiovascular:  Negative for chest pain, leg swelling and palpitations.  Gastrointestinal:  Negative for abdominal distention, abdominal pain, constipation, diarrhea, nausea and vomiting.  Endocrine: Negative for hot flashes.  Genitourinary:  Negative for difficulty urinating.   Musculoskeletal:  Negative for arthralgias.  Skin:  Negative for itching and rash.  Neurological:  Negative for dizziness, extremity weakness, headaches and numbness.  Hematological:  Negative for adenopathy. Does not bruise/bleed easily.  Psychiatric/Behavioral:  Negative for depression. The patient is not nervous/anxious.       PHYSICAL EXAMINATION    Seen in infusion, see [facility] for vitals  Physical Exam Constitutional:      General: She is not in acute distress.    Appearance: Normal appearance. She is not toxic-appearing.  HENT:     Head: Normocephalic and atraumatic.     Mouth/Throat:     Mouth: Mucous membranes are moist.     Pharynx: Oropharynx is clear. No oropharyngeal exudate or posterior oropharyngeal erythema.  Eyes:     General: No scleral icterus. Cardiovascular:     Rate and Rhythm: Normal rate and regular rhythm.     Pulses: Normal pulses.     Heart sounds: Normal heart sounds.  Pulmonary:     Effort: Pulmonary effort is normal.     Breath sounds: Normal breath sounds.  Abdominal:     General: Abdomen is flat. Bowel sounds are normal. There is no distension.     Palpations: Abdomen is soft.     Tenderness: There is no abdominal tenderness.  Musculoskeletal:        General: No swelling.     Cervical back: Neck supple.   Lymphadenopathy:     Cervical: No cervical adenopathy.  Skin:    General: Skin is warm and dry.     Findings: No rash.  Neurological:     General: No focal deficit present.     Mental Status: She is alert.  Psychiatric:        Mood and Affect: Mood normal.        Behavior: Behavior normal.     LABORATORY DATA:  CBC    Component Value Date/Time   WBC 4.0 02/20/2023 0810   WBC 6.9 11/20/2022 1624   RBC 3.26 (L) 02/20/2023 0810   HGB 10.8 (L) 02/20/2023 0810   HCT 29.9 (L) 02/20/2023 0810   PLT 139 (L) 02/20/2023 0810   MCV 91.7 02/20/2023 0810   MCH 33.1 02/20/2023 0810   MCHC 36.1 (H) 02/20/2023 0810   RDW 12.4 02/20/2023 0810   LYMPHSABS 0.7 02/20/2023 0810   MONOABS 0.3 02/20/2023 0810   EOSABS 0.1 02/20/2023 0810   BASOSABS 0.0 02/20/2023 0810    CMP     Component Value Date/Time   NA 140 02/20/2023 0810   K 2.9 (L) 02/20/2023 0810   CL 107 02/20/2023 0810   CO2 27 02/20/2023 0810   GLUCOSE 105 (H) 02/20/2023 0810   BUN 15 02/20/2023 0810   CREATININE 1.02 (H) 02/20/2023 0810   CALCIUM 8.3 (L) 02/20/2023 0810   PROT 6.0 (L) 02/20/2023 0810   ALBUMIN 3.5 02/20/2023 0810   AST 9 (L) 02/20/2023 0810   ALT 6 02/20/2023 0810   ALKPHOS 48 02/20/2023 0810   BILITOT 0.6 02/20/2023 0810   GFRNONAA >60 02/20/2023 0810   GFRAA >90 11/04/2014 1100       ASSESSMENT and THERAPY PLAN:   Malignant neoplasm of upper-outer quadrant of left breast in female, estrogen receptor positive (HCC) [name] [name] is a 47 year old woman with 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  She is status post neoadjuvant chemotherapy, lumpectomy, adjuvant radiation, maintenance Herceptin Perjeta, and tamoxifen that began in August 2024  Left breast stage Ib invasive ductal carcinoma, triple positive: She continues on tamoxifen, Herceptin, Perjeta with  good tolerance.  She will continue this. At risk for heart failure due to Herceptin  treatment: We are monitoring echocardiograms every 3 months.  Her most recent echo occurred in June 2024.  It was normal.  We will repeat her next echocardiogram in September 2024.  Orders placed today. Next steps: She will finish her maintenance Herceptin Perjeta, continue on tamoxifen through November, and I will see her at the end of November for her survivorship care plan visit.  Shontrice will return as indicated above and we will see her back in 3 weeks for her next treatment.    All questions were answered. The patient knows to call the clinic with any problems, questions or concerns. We can certainly see the patient much sooner if necessary.  Total encounter time:20 minutes*in face-to-face visit time, chart review, lab review, care coordination, order entry, and documentation of the encounter time.    Lillard Anes, NP 02/20/23 10:08 AM Medical Oncology and Hematology [facility] [address] [place], [place] [address] Tel. [phone]    Fax. [phone]  *Total Encounter Time as defined by the Centers for Medicare and Medicaid Services includes, in addition to the face-to-face time of a patient visit (documented in the note above) non-face-to-face time: obtaining and reviewing outside history, ordering and reviewing medications, tests or procedures, care coordination (communications with other health care professionals or caregivers) and documentation in the medical record.

## 2023-02-20 NOTE — Patient Instructions (Signed)
[facility]  Discharge Instructions: Thank you for choosing [facility] to provide your oncology and hematology care.   If you have a lab appointment with the Cancer Center, please go directly to the Cancer Center and check in at the registration area.   Wear comfortable clothing and clothing appropriate for easy access to any Portacath or PICC line.   We strive to give you quality time with your provider. You may need to reschedule your appointment if you arrive late (15 or more minutes).  Arriving late affects you and other patients whose appointments are after yours.  Also, if you miss three or more appointments without notifying the office, you may be dismissed from the clinic at the provider's discretion.      For prescription refill requests, have your pharmacy contact our office and allow 72 hours for refills to be completed.    Today you received the following chemotherapy and/or immunotherapy agents: Kanjinti/Perjeta   To help prevent nausea and vomiting after your treatment, we encourage you to take your nausea medication as directed.  BELOW ARE SYMPTOMS THAT SHOULD BE REPORTED IMMEDIATELY: *FEVER GREATER THAN 100.4 F (38 C) OR HIGHER *CHILLS OR SWEATING *NAUSEA AND VOMITING THAT IS NOT CONTROLLED WITH YOUR NAUSEA MEDICATION *UNUSUAL SHORTNESS OF BREATH *UNUSUAL BRUISING OR BLEEDING *URINARY PROBLEMS (pain or burning when urinating, or frequent urination) *BOWEL PROBLEMS (unusual diarrhea, constipation, pain near the anus) TENDERNESS IN MOUTH AND THROAT WITH OR WITHOUT PRESENCE OF ULCERS (sore throat, sores in mouth, or a toothache) UNUSUAL RASH, SWELLING OR PAIN  UNUSUAL VAGINAL DISCHARGE OR ITCHING   Items with * indicate a potential emergency and should be followed up as soon as possible or go to the Emergency Department if any problems should occur.  Please show the CHEMOTHERAPY ALERT CARD or IMMUNOTHERAPY ALERT CARD at  check-in to the Emergency Department and triage nurse.  Should you have questions after your visit or need to cancel or reschedule your appointment, please contact [facility]  Dept: [phone]  and follow the prompts.  Office hours are 8:00 a.m. to 4:30 p.m. Monday - Friday. Please note that voicemails left after 4:00 p.m. may not be returned until the following business day.  We are closed weekends and major holidays. You have access to a nurse at all times for urgent questions. Please call the main number to the clinic Dept: [phone] and follow the prompts.   For any non-urgent questions, you may also contact your provider using MyChart. We now offer e-Visits for anyone 18 and older to request care online for non-urgent symptoms. For details visit [email or web address].   Also download the MyChart app! Go to the app store, search "MyChart", open the app, select [facility], and log in with your MyChart username and password.   

## 2023-02-20 NOTE — Assessment & Plan Note (Signed)
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  She is status post neoadjuvant chemotherapy, lumpectomy, adjuvant radiation, maintenance Herceptin Perjeta, and tamoxifen that began in August 2024  Left breast stage Ib invasive ductal carcinoma, triple positive: She continues on tamoxifen, Herceptin, Perjeta with good tolerance.  She will continue this. At risk for heart failure due to Herceptin treatment: We are monitoring echocardiograms every 3 months.  Her most recent echo occurred in June 2024.  It was normal.  We will repeat her next echocardiogram in September 2024.  Orders placed today. Next steps: She will finish her maintenance Herceptin Perjeta, continue on tamoxifen through November, and I will see her at the end of November for her survivorship care plan visit.  [name] [name] will return as indicated above and we will see her back in 3 weeks for her next treatment.

## 2023-02-23 ENCOUNTER — Other Ambulatory Visit: Payer: Self-pay | Admitting: *Deleted

## 2023-02-23 ENCOUNTER — Telehealth: Payer: Self-pay | Admitting: *Deleted

## 2023-02-23 DIAGNOSIS — C50412 Malignant neoplasm of upper-outer quadrant of left female breast: Secondary | ICD-10-CM

## 2023-02-23 MED ORDER — POTASSIUM CHLORIDE ER 10 MEQ PO CPCR: ORAL_CAPSULE | ORAL | 3 refills | Status: DC

## 2023-02-23 MED ORDER — DIPHENOXYLATE-ATROPINE 2.5-0.025 MG PO TABS: 1.0000 | ORAL_TABLET | Freq: Four times a day (QID) | ORAL | 0 refills | Status: DC | PRN

## 2023-02-23 NOTE — Telephone Encounter (Signed)
This RN spoke with pt need to follow up on potassium- she states she has klor con that she is supposed to take daily though she has difficulty "because I swallow it and then it comes back"  She has klor con 20

## 2023-02-23 NOTE — Telephone Encounter (Addendum)
-----   Message from Noreene Filbert sent at 02/22/2023  4:47 PM EDT ----- Patient's potassium is very low.  Is she taking any supplementation?  If she having any diarrhea or on a diuretic?   This RN spoke with pt- she has Klor con 20 meq with noted difficulty in maintaining dosing due to size and "it comes back up"  She is having diarrhea and states the lomotil works bette for her and needs a refill  Obtained new script for Micro K 10 meq with new dosing.  [facility]/NP refilled lomotil  No further needs at this time.

## 2023-03-08 ENCOUNTER — Telehealth: Payer: Self-pay | Admitting: Hematology and Oncology

## 2023-03-08 NOTE — Telephone Encounter (Signed)
Scheduled appointments per WQ. Patient is aware of the made appointments.  

## 2023-03-15 ENCOUNTER — Inpatient Hospital Stay: Payer: 59 | Attending: Hematology and Oncology | Admitting: Hematology and Oncology

## 2023-03-15 ENCOUNTER — Inpatient Hospital Stay: Payer: 59

## 2023-03-15 VITALS — BP 123/91 | HR 73 | Resp 18

## 2023-03-15 DIAGNOSIS — Z170 Estrogen receptor positive status [ER+]: Secondary | ICD-10-CM

## 2023-03-15 DIAGNOSIS — Z5112 Encounter for antineoplastic immunotherapy: Secondary | ICD-10-CM | POA: Diagnosis not present

## 2023-03-15 DIAGNOSIS — Z5111 Encounter for antineoplastic chemotherapy: Secondary | ICD-10-CM | POA: Diagnosis present

## 2023-03-15 DIAGNOSIS — C50412 Malignant neoplasm of upper-outer quadrant of left female breast: Secondary | ICD-10-CM

## 2023-03-15 LAB — CMP (CANCER CENTER ONLY)
ALT: 8 U/L (ref 0–44)
AST: 11 U/L — ABNORMAL LOW (ref 15–41)
Albumin: 3.6 g/dL (ref 3.5–5.0)
Alkaline Phosphatase: 50 U/L (ref 38–126)
Anion gap: 5 (ref 5–15)
BUN: 8 mg/dL (ref 6–20)
CO2: 28 mmol/L (ref 22–32)
Calcium: 8.5 mg/dL — ABNORMAL LOW (ref 8.9–10.3)
Chloride: 106 mmol/L (ref 98–111)
Creatinine: 1.03 mg/dL — ABNORMAL HIGH (ref 0.44–1.00)
GFR, Estimated: >60 mL/min (ref >60)
Glucose, Bld: 131 mg/dL — ABNORMAL HIGH (ref 70–99)
Potassium: 3.3 mmol/L — ABNORMAL LOW (ref 3.5–5.1)
Sodium: 139 mmol/L (ref 135–145)
Total Bilirubin: 0.6 mg/dL (ref 0.3–1.2)
Total Protein: 6.2 g/dL — ABNORMAL LOW (ref 6.5–8.1)

## 2023-03-15 LAB — CBC WITH DIFFERENTIAL (CANCER CENTER ONLY)
Abs Immature Granulocytes: 0 10*3/uL (ref 0.00–0.07)
Basophils Absolute: 0 10*3/uL (ref 0.0–0.1)
Basophils Relative: 1 %
Eosinophils Absolute: 0.1 10*3/uL (ref 0.0–0.5)
Eosinophils Relative: 2 %
HCT: 27.1 % — ABNORMAL LOW (ref 36.0–46.0)
Hemoglobin: 9.5 g/dL — ABNORMAL LOW (ref 12.0–15.0)
Immature Granulocytes: 0 %
Lymphocytes Relative: 16 %
Lymphs Abs: 0.7 10*3/uL (ref 0.7–4.0)
MCH: 32.3 pg (ref 26.0–34.0)
MCHC: 35.1 g/dL (ref 30.0–36.0)
MCV: 92.2 fL (ref 80.0–100.0)
Monocytes Absolute: 0.2 10*3/uL (ref 0.1–1.0)
Monocytes Relative: 5 %
Neutro Abs: 3.3 10*3/uL (ref 1.7–7.7)
Neutrophils Relative %: 76 %
Platelet Count: 147 10*3/uL — ABNORMAL LOW (ref 150–400)
RBC: 2.94 MIL/uL — ABNORMAL LOW (ref 3.87–5.11)
RDW: 12.5 % (ref 11.5–15.5)
WBC Count: 4.3 10*3/uL (ref 4.0–10.5)
nRBC: 0 % (ref 0.0–0.2)

## 2023-03-15 MED ORDER — ACETAMINOPHEN 325 MG PO TABS
650.0000 mg | ORAL_TABLET | Freq: Once | ORAL | Status: AC
  Administered 2023-03-15: 650 mg via ORAL
  Filled 2023-03-15: qty 2

## 2023-03-15 MED ORDER — HEPARIN SOD (PORK) LOCK FLUSH 100 UNIT/ML IV SOLN
500.0000 [IU] | Freq: Once | INTRAVENOUS | Status: AC | PRN
  Administered 2023-03-15: 500 [IU]

## 2023-03-15 MED ORDER — SODIUM CHLORIDE 0.9 % IV SOLN
420.0000 mg | Freq: Once | INTRAVENOUS | Status: AC
  Administered 2023-03-15: 420 mg via INTRAVENOUS
  Filled 2023-03-15: qty 14

## 2023-03-15 MED ORDER — SODIUM CHLORIDE 0.9 % IV SOLN: Freq: Once | INTRAVENOUS | Status: AC

## 2023-03-15 MED ORDER — SODIUM CHLORIDE 0.9% FLUSH
10.0000 mL | Freq: Once | INTRAVENOUS | Status: AC
  Administered 2023-03-15: 10 mL

## 2023-03-15 MED ORDER — TRASTUZUMAB-ANNS CHEMO 150 MG IV SOLR
6.0000 mg/kg | Freq: Once | INTRAVENOUS | Status: AC
  Administered 2023-03-15: 420 mg via INTRAVENOUS
  Filled 2023-03-15: qty 20

## 2023-03-15 MED ORDER — DIPHENHYDRAMINE HCL 25 MG PO CAPS
50.0000 mg | ORAL_CAPSULE | Freq: Once | ORAL | Status: AC
  Administered 2023-03-15: 50 mg via ORAL
  Filled 2023-03-15: qty 2

## 2023-03-15 MED ORDER — SODIUM CHLORIDE 0.9% FLUSH
10.0000 mL | INTRAVENOUS | Status: DC | PRN
  Administered 2023-03-15: 10 mL

## 2023-03-15 NOTE — Progress Notes (Signed)
Patient declined to stay for 30 minute post perjeta observation. 

## 2023-03-15 NOTE — Patient Instructions (Signed)
[facility]  Discharge Instructions: Thank you for choosing [facility] to provide your oncology and hematology care.   If you have a lab appointment with the Cancer Center, please go directly to the Cancer Center and check in at the registration area.   Wear comfortable clothing and clothing appropriate for easy access to any Portacath or PICC line.   We strive to give you quality time with your provider. You may need to reschedule your appointment if you arrive late (15 or more minutes).  Arriving late affects you and other patients whose appointments are after yours.  Also, if you miss three or more appointments without notifying the office, you may be dismissed from the clinic at the provider's discretion.      For prescription refill requests, have your pharmacy contact our office and allow 72 hours for refills to be completed.    Today you received the following chemotherapy and/or immunotherapy agents: Kanjinti/Perjeta   To help prevent nausea and vomiting after your treatment, we encourage you to take your nausea medication as directed.  BELOW ARE SYMPTOMS THAT SHOULD BE REPORTED IMMEDIATELY: *FEVER GREATER THAN 100.4 F (38 C) OR HIGHER *CHILLS OR SWEATING *NAUSEA AND VOMITING THAT IS NOT CONTROLLED WITH YOUR NAUSEA MEDICATION *UNUSUAL SHORTNESS OF BREATH *UNUSUAL BRUISING OR BLEEDING *URINARY PROBLEMS (pain or burning when urinating, or frequent urination) *BOWEL PROBLEMS (unusual diarrhea, constipation, pain near the anus) TENDERNESS IN MOUTH AND THROAT WITH OR WITHOUT PRESENCE OF ULCERS (sore throat, sores in mouth, or a toothache) UNUSUAL RASH, SWELLING OR PAIN  UNUSUAL VAGINAL DISCHARGE OR ITCHING   Items with * indicate a potential emergency and should be followed up as soon as possible or go to the Emergency Department if any problems should occur.  Please show the CHEMOTHERAPY ALERT CARD or IMMUNOTHERAPY ALERT CARD at  check-in to the Emergency Department and triage nurse.  Should you have questions after your visit or need to cancel or reschedule your appointment, please contact [facility]  Dept: [phone]  and follow the prompts.  Office hours are 8:00 a.m. to 4:30 p.m. Monday - Friday. Please note that voicemails left after 4:00 p.m. may not be returned until the following business day.  We are closed weekends and major holidays. You have access to a nurse at all times for urgent questions. Please call the main number to the clinic Dept: [phone] and follow the prompts.   For any non-urgent questions, you may also contact your provider using MyChart. We now offer e-Visits for anyone 18 and older to request care online for non-urgent symptoms. For details visit [email or web address].   Also download the MyChart app! Go to the app store, search "MyChart", open the app, select [facility], and log in with your MyChart username and password.   

## 2023-03-15 NOTE — Progress Notes (Signed)
[name] Cancer Follow up:    [name], [name] [name], [name] [name] [address] [place] [address]   DIAGNOSIS:  Cancer Staging  Malignant neoplasm of upper-outer quadrant of left breast in female, estrogen receptor positive (HCC) Staging form: Breast, AJCC 8th Edition - Clinical stage from 05/08/2022: Stage IB (cT2, cN0, cM0, G2, ER+, PR+, HER2+) - Signed by Ronny Bacon, PA-C on 05/08/2022 Stage prefix: Initial diagnosis Method of lymph node assessment: Clinical Histologic grading system: 3 grade system - Pathologic stage from 11/07/2022: No Stage Recommended (ypT0, pN0, cM0) - Signed by Loa Socks, NP on 11/28/2022 Stage prefix: Post-therapy   SUMMARY OF ONCOLOGIC HISTORY: Oncology History  Malignant neoplasm of upper-outer quadrant of left breast in female, estrogen receptor positive (HCC)  04/25/2022 Imaging   Patient had diagnostic bilateral mammogram given palpable mass in the left breast.  This showed a 2.6 x 3.5 x 3 cm irregular mass as well as possible satellite mass which is indeterminate.  An ultrasound was recommended.  Ultrasound of the left breast mass showed 3.9 x 2.4 x 2.3 cm irregular mass with an irregular and spiculated margin at 3:00 middle depth 5 cm from the nipple, irregular mass is hypoechoic.  In the left breast at 1 o'clock position, 3 cm from the nipple there is a round mass measuring 0.3 x 0.2 x 0.3 cm.  No significant abnormalities were seen sonographically in the left axilla.  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  Left breast needle core biopsy at 1:00 3 cm from the nipple showed fibrocystic changes with usual ductal hyperplasia negative for malignancy.  Prognostic showed ER 95% PR 40%,  HER2 3+ KI of 50%   05/08/2022 Cancer Staging   Staging form: Breast, AJCC 8th Edition - Clinical stage from 05/08/2022: Stage IB (cT2, cN0, cM0, G2, ER+, PR+, HER2+) - Signed by Ronny Bacon, PA-C on 05/08/2022 Stage prefix: Initial diagnosis Method of lymph node assessment: Clinical Histologic grading system: 3 grade system   05/18/2022 - 09/16/2022 Chemotherapy   Patient is on Treatment Plan : BREAST  Docetaxel + Carboplatin + Trastuzumab + Pertuzumab  (TCHP) q21d      05/23/2022 Genetic Testing   Negative genetics for [facility] CustomNext-Cancer +RNAinsight Panel.  VUS in ATM at [name] ([id number]). Report date is 05/23/2022.   The CustomNext-Cancer+RNAinsight panel offered by [employer] includes sequencing and rearrangement analysis for the following 47 genes:  APC, ATM, AXIN2, BARD1, BMPR1A, BRCA1, BRCA2, BRIP1, CDH1, CDK4, CDKN2A, CHEK2, DICER1, EPCAM, GREM1, HOXB13, MEN1, MLH1, MSH2, MSH3, MSH6, MUTYH, NBN, NF1, NF2, NTHL1, PALB2, PMS2, POLD1, POLE, PTEN, RAD51C, RAD51D, RECQL, RET, SDHA, SDHAF2, SDHB, SDHC, SDHD, SMAD4, SMARCA4, STK11, TP53, TSC1, TSC2, and VHL.  RNA data is routinely analyzed for use in variant interpretation for all genes.   10/17/2022 -  Chemotherapy   Patient is on Treatment Plan : BREAST Trastuzumab + Pertuzumab q21d x 11 cycles     11/07/2022 Surgery   Left breast lumpectomy: No residual invasive carcinoma or DCIS.  3 SLN negative for cancer, ypT0,pN0   11/07/2022 Cancer Staging   Staging form: Breast, AJCC 8th Edition - Pathologic stage from 11/07/2022: No Stage Recommended (ypT0, pN0, cM0) - Signed by Loa Socks, NP on  11/28/2022 Stage prefix: Post-therapy   12/18/2022 - 01/16/2023 Radiation Therapy   Plan Name: Breast_L_BH Site: Breast, Left Technique: 3D Mode: Photon Dose Per Fraction: 2.66 Gy Prescribed Dose (Delivered / Prescribed): 42.56 Gy / 42.56 Gy Prescribed Fxs (Delivered / Prescribed): 16 / 16   Plan Name:  Brst_L_Bst_BH Site: Breast, Left Technique: 3D Mode: Photon Dose Per Fraction: 2 Gy Prescribed Dose (Delivered / Prescribed): 8 Gy / 8 Gy Prescribed Fxs (Delivered / Prescribed): 4 / 4     02/2023 -  Anti-estrogen oral therapy   Tamoxifen daily     CURRENT THERAPY: Tamoxifen/Herceptin/Perjeta  INTERVAL HISTORY:  [name] [name] 47 y.o. female returns for follow-up prior to receiving Herceptin Perjeta.  At her last visit she started tamoxifen.  She denies any hot flashes, vaginal discharge, or joint pains.  She denies any chest pain, chest pressure, shortness of breath. Her most recent echocardiogram occurred on December 29, 2022 demonstrating a left ventricular ejection fraction of 55 to 60%.  Her global longitudinal strain was normal. Rest of the pertinent 10 point ROS reviewed and negative  Patient Active Problem List   Diagnosis Date Noted   Genetic testing 05/30/2022   Family history of breast cancer 05/10/2022   Family history of ovarian cancer 05/10/2022   Malignant neoplasm of upper-outer quadrant of left breast in female, estrogen receptor positive (HCC) 05/08/2022   Food impaction of esophagus    Schatzki's ring    Hx of migraine headaches 11/17/2013   Ureteral calculus 11/17/2013   GERD (gastroesophageal reflux disease) 11/17/2013   Esophageal obstruction due to food impaction 11/17/2013    is allergic to amoxicillin, coconut (cocos nucifera), penicillins, duloxetine, hydrocodone, hydrocodone-acetaminophen, and moxifloxacin.  MEDICAL HISTORY: Past Medical History:  Diagnosis Date   Anemia    history of   Breast cancer, left (HCC)    Dyspnea    even with speaking   Family history of adverse reaction to anesthesia    mother slow to awaken   Family history of breast cancer 05/10/2022   Family history of ovarian cancer 05/10/2022   Fibromyalgia    GERD (gastroesophageal reflux disease)    History of kidney stones    last stone 6 months ago    Migraine     Seasonal allergies    Tinnitus    Vertigo     SURGICAL HISTORY: Past Surgical History:  Procedure Laterality Date   ADENOIDECTOMY  1991   BREAST BIOPSY  11/06/2022   MM LT RADIOACTIVE SEED LOC MAMMO GUIDE 11/06/2022 [facility]   BREAST BIOPSY  11/06/2022   MM LT RADIOACTIVE SEED EA ADD LESION LOC MAMMO GUIDE 11/06/2022 [facility]   BREAST LUMPECTOMY WITH RADIOACTIVE SEED AND SENTINEL LYMPH NODE BIOPSY Left 11/07/2022   Procedure: LEFT BREAST LUMPECTOMY WITH RADIOACTIVE SEEDX2 AND SENTINEL LYMPH NODE BIOPSY;  Surgeon: Abigail Miyamoto, MD;  Location: [facility];  Service: General;  Laterality: Left;   CYSTOSCOPY WITH RETROGRADE PYELOGRAM, URETEROSCOPY AND STENT PLACEMENT Left 02/22/2021   Procedure: CYSTOSCOPY WITH RETROGRADE PYELOGRAM, URETEROSCOPY AND STENT PLACEMENT;  Surgeon: Belva Agee, MD;  Location: [facility];  Service: Urology;  Laterality: Left;   CYSTOSCOPY/URETEROSCOPY/HOLMIUM LASER/STENT PLACEMENT Left 03/10/2021   Procedure: CYSTOSCOPY RETROGRADE, LEFT URETEROSCOPY/ STONE BASKETRY /HOLMIUM LASER/STENT EXCHANGE;  Surgeon: Crist Fat, MD;  [facility];  Service: Urology;  Laterality: Left;   ESOPHAGOGASTRODUODENOSCOPY N/A 11/17/2013   Procedure: ESOPHAGOGASTRODUODENOSCOPY (EGD);  Surgeon: Rachael Fee, MD;  Location[facility];  Service: Endoscopy;  Laterality:  N/A;   ESOPHAGOGASTRODUODENOSCOPY N/A 10/15/2015   Procedure: ESOPHAGOGASTRODUODENOSCOPY (EGD);  Surgeon: Iva Boop, MD;  Location: [facility] ENDOSCOPY;  Service: Endoscopy;  Laterality: N/A;   HOLMIUM LASER APPLICATION  03/10/2021   Procedure: HOLMIUM LASER APPLICATION;  Surgeon: Crist Fat, MD;  Location[facility];  Service: Urology;;   NASAL SEPTOPLASTY W/ TURBINOPLASTY Bilateral 03/18/2018   Procedure: BILATERAL NASAL SEPTOPLASTY WITH TURBINATE REDUCTION;  Surgeon: Flo Shanks, MD;  Location: [facility];  Service: ENT;  Laterality:  Bilateral;   PORTACATH PLACEMENT Right 05/17/2022   Procedure: PORT-A-CATH INSERTION WITH ULTRASOUND GUIDANCE;  Surgeon: Abigail Miyamoto, MD;  Location: [facility];  Service: General;  Laterality: Right;   TONSILLECTOMY  1991   TUBAL LIGATION  2005    SOCIAL HISTORY: Social History   Socioeconomic History   Marital status: Single    Spouse name: Not on file   Number of children: Not on file   Years of education: Not on file   Highest education level: Not on file  Occupational History   Not on file  Tobacco Use   Smoking status: Never   Smokeless tobacco: Never  Vaping Use   Vaping status: Never Used  Substance and Sexual Activity   Alcohol use: No   Drug use: No   Sexual activity: Not on file  Other Topics Concern   Not on file  Social History Narrative   ** Merged History Encounter **       Social Determinants of Health   Financial Resource Strain: Low Risk  (05/10/2022)   Overall Financial Resource Strain (CARDIA)    Difficulty of Paying Living Expenses: Not very hard  Food Insecurity: No Food Insecurity (12/05/2022)   Hunger Vital Sign    Worried About Running Out of Food in the Last Year: Never true    Ran Out of Food in the Last Year: Never true  Transportation Needs: No Transportation Needs (12/05/2022)   PRAPARE - Administrator, Civil Service (Medical): No    Lack of Transportation (Non-Medical): No  Physical Activity: Not on file  Stress: Not on file  Social Connections: Not on file  Intimate Partner Violence: Not At Risk (12/05/2022)   Humiliation, Afraid, Rape, and Kick questionnaire    Fear of Current or Ex-Partner: No    Emotionally Abused: No    Physically Abused: No    Sexually Abused: No    FAMILY HISTORY: Family History  Problem Relation Age of Onset   Asthma Mother    Diabetes Mother    Colon polyps Mother    Ovarian cancer Maternal Grandmother 64   Diabetes Maternal Grandmother    Esophageal cancer Maternal Grandfather        d.  66   Breast cancer Other        MGM's sisters x2   Cervical cancer Cousin        mat cousin; dx <40    Review of Systems  Constitutional:  Negative for appetite change, chills, fatigue, fever and unexpected weight change.  HENT:   Negative for hearing loss, lump/mass and trouble swallowing.   Eyes:  Negative for eye problems and icterus.  Respiratory:  Negative for chest tightness, cough and shortness of breath.   Cardiovascular:  Negative for chest pain, leg swelling and palpitations.  Gastrointestinal:  Negative for abdominal distention, abdominal pain, constipation, diarrhea, nausea and vomiting.  Endocrine: Negative for hot flashes.  Genitourinary:  Negative for difficulty urinating.   Musculoskeletal:  Negative for arthralgias.  Skin:  Negative for itching and rash.  Neurological:  Negative for dizziness, extremity weakness, headaches and numbness.  Hematological:  Negative for adenopathy. Does not bruise/bleed easily.  Psychiatric/Behavioral:  Negative for depression. The patient is not nervous/anxious.       PHYSICAL EXAMINATION    Seen in infusion, see [facility] for vitals  Physical Exam Constitutional:      General: She is not in acute distress.    Appearance: Normal appearance. She is not toxic-appearing.  HENT:     Head: Normocephalic and atraumatic.     Mouth/Throat:     Mouth: Mucous membranes are moist.     Pharynx: Oropharynx is clear. No oropharyngeal exudate or posterior oropharyngeal erythema.  Eyes:     General: No scleral icterus. Cardiovascular:     Rate and Rhythm: Normal rate and regular rhythm.     Pulses: Normal pulses.     Heart sounds: Normal heart sounds.  Pulmonary:     Effort: Pulmonary effort is normal.     Breath sounds: Normal breath sounds.  Abdominal:     General: Abdomen is flat. Bowel sounds are normal. There is no distension.     Palpations: Abdomen is soft.     Tenderness: There is no abdominal tenderness.  Musculoskeletal:         General: No swelling.     Cervical back: Neck supple.  Lymphadenopathy:     Cervical: No cervical adenopathy.  Skin:    General: Skin is warm and dry.     Findings: No rash.  Neurological:     General: No focal deficit present.     Mental Status: She is alert.  Psychiatric:        Mood and Affect: Mood normal.        Behavior: Behavior normal.     LABORATORY DATA:  CBC    Component Value Date/Time   WBC 4.3 03/15/2023 1240   WBC 6.9 11/20/2022 1624   RBC 2.94 (L) 03/15/2023 1240   HGB 9.5 (L) 03/15/2023 1240   HCT 27.1 (L) 03/15/2023 1240   PLT 147 (L) 03/15/2023 1240   MCV 92.2 03/15/2023 1240   MCH 32.3 03/15/2023 1240   MCHC 35.1 03/15/2023 1240   RDW 12.5 03/15/2023 1240   LYMPHSABS 0.7 03/15/2023 1240   MONOABS 0.2 03/15/2023 1240   EOSABS 0.1 03/15/2023 1240   BASOSABS 0.0 03/15/2023 1240    CMP     Component Value Date/Time   NA 139 03/15/2023 1240   K 3.3 (L) 03/15/2023 1240   CL 106 03/15/2023 1240   CO2 28 03/15/2023 1240   GLUCOSE 131 (H) 03/15/2023 1240   BUN 8 03/15/2023 1240   CREATININE 1.03 (H) 03/15/2023 1240   CALCIUM 8.5 (L) 03/15/2023 1240   PROT 6.2 (L) 03/15/2023 1240   ALBUMIN 3.6 03/15/2023 1240   AST 11 (L) 03/15/2023 1240   ALT 8 03/15/2023 1240   ALKPHOS 50 03/15/2023 1240   BILITOT 0.6 03/15/2023 1240   GFRNONAA >60 03/15/2023 1240   GFRAA >90 11/04/2014 1100       ASSESSMENT and THERAPY PLAN:   Malignant neoplasm of upper-outer quadrant of left breast in female, estrogen receptor positive (HCC) [name] [name] is a 47 year old woman with 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  She is status post neoadjuvant chemotherapy, lumpectomy, adjuvant radiation, maintenance Herceptin Perjeta, and tamoxifen that began in August 2024  Left breast stage Ib invasive  ductal carcinoma, triple positive: She continues on tamoxifen, Herceptin, Perjeta with good tolerance.  She will continue this.   She is tolerating the combination extremely well At risk for heart failure due to Herceptin treatment: We are monitoring echocardiograms every 3 months.  Last last echo showed mild change in EF, she is due for another echocardiogram which has already been scheduled.  She denies any symptoms at all.   [name] [name] will return as indicated above and we will see her back in 6 weeks.     All questions were answered. The patient knows to call the clinic with any problems, questions or concerns. We can certainly see the patient much sooner if necessary.  Total encounter time:20 minutes*in face-to-face visit time, chart review, lab review, care coordination, order entry, and documentation of the encounter time.   *Total Encounter Time as defined by the Centers for Medicare and Medicaid Services includes, in addition to the face-to-face time of a patient visit (documented in the note above) non-face-to-face time: obtaining and reviewing outside history, ordering and reviewing medications, tests or procedures, care coordination (communications with other health care professionals or caregivers) and documentation in the medical record.

## 2023-03-16 ENCOUNTER — Encounter: Payer: Self-pay | Admitting: Hematology and Oncology

## 2023-03-16 NOTE — Assessment & Plan Note (Signed)
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  She is status post neoadjuvant chemotherapy, lumpectomy, adjuvant radiation, maintenance Herceptin Perjeta, and tamoxifen that began in August 2024  Left breast stage Ib invasive ductal carcinoma, triple positive: She continues on tamoxifen, Herceptin, Perjeta with good tolerance.  She will continue this.  She is tolerating the combination extremely well At risk for heart failure due to Herceptin treatment: We are monitoring echocardiograms every 3 months.  Last last echo showed mild change in EF, she is due for another echocardiogram which has already been scheduled.  She denies any symptoms at all.   [name] [name] will return as indicated above and we will see her back in 6 weeks.

## 2023-03-21 ENCOUNTER — Other Ambulatory Visit: Payer: Self-pay

## 2023-03-23 ENCOUNTER — Ambulatory Visit (HOSPITAL_COMMUNITY)
Admission: RE | Admit: 2023-03-23 | Discharge: 2023-03-23 | Disposition: A | Discharge status: Home / Self Care | Payer: 59 | Source: Ambulatory Visit | Attending: Adult Health | Admitting: Adult Health

## 2023-03-23 ENCOUNTER — Telehealth: Payer: Self-pay

## 2023-03-23 DIAGNOSIS — C50412 Malignant neoplasm of upper-outer quadrant of left female breast: Secondary | ICD-10-CM | POA: Insufficient documentation

## 2023-03-23 DIAGNOSIS — Z7969 Long term (current) use of other immunomodulators and immunosuppressants: Secondary | ICD-10-CM | POA: Diagnosis not present

## 2023-03-23 DIAGNOSIS — Z170 Estrogen receptor positive status [ER+]: Secondary | ICD-10-CM | POA: Insufficient documentation

## 2023-03-23 DIAGNOSIS — Z0189 Encounter for other specified special examinations: Secondary | ICD-10-CM | POA: Diagnosis not present

## 2023-03-23 LAB — ECHOCARDIOGRAM COMPLETE
AR max vel: 2.71 cm2
AV Area VTI: 2.6 cm2
AV Area mean vel: 2.58 cm2
AV Mean grad: 3 mmHg
AV Peak grad: 6.3 mmHg
Ao pk vel: 1.25 m/s
Area-P 1/2: 2.91 cm2
S' Lateral: 3.1 cm

## 2023-03-23 NOTE — Telephone Encounter (Signed)
Staff message sent to this RN that pt had questions for Dr. Remonia Richter nurse and to please give her a call and that she has a few questions. This RN called pt and LVM stating she was calling to answer her questions. This RN left call back number [phone].

## 2023-03-23 NOTE — Progress Notes (Addendum)
Radiation Oncology         [phone] ________________________________  Name: [name] [name] MRN: [id number]  Date of Service: 03/23/2023 DOB: [birth year]  Post Treatment Telephone Note  Diagnosis: Stage IB, cT2N0M0, grade 2, Triple Positive invasive ductal carcinoma of the left breast with complete pathologic response to neoadjuvant chemotherapy (as documented in provider EOT note)  The patient was available for call today.   Symptoms of fatigue have improved since completing therapy.  Symptoms of skin changes have improved since completing therapy.  The patient was encouraged to avoid sun exposure in the area of prior treatment for up to one year following radiation with either sunscreen or by the style of clothing worn in the sun.  The patient has scheduled follow up with her medical oncologist Dr. Al Pimple for ongoing surveillance, and was encouraged to call if she develops concerns or questions regarding radiation.   This concludes the interaction.  Ruel Favors, LPN

## 2023-03-26 ENCOUNTER — Ambulatory Visit
Admission: RE | Admit: 2023-03-26 | Discharge: 2023-03-26 | Disposition: A | Discharge status: Home / Self Care | Payer: 59 | Source: Ambulatory Visit | Attending: Radiation Oncology | Admitting: Radiation Oncology

## 2023-03-29 ENCOUNTER — Other Ambulatory Visit: Payer: Self-pay

## 2023-04-03 ENCOUNTER — Inpatient Hospital Stay: Payer: 59

## 2023-04-03 ENCOUNTER — Other Ambulatory Visit: Payer: Self-pay

## 2023-04-03 ENCOUNTER — Ambulatory Visit: Payer: 59 | Admitting: Hematology and Oncology

## 2023-04-03 VITALS — BP 105/69 | HR 68 | Temp 98.2°F | Resp 16 | Wt 164.5 lb

## 2023-04-03 DIAGNOSIS — Z170 Estrogen receptor positive status [ER+]: Secondary | ICD-10-CM

## 2023-04-03 DIAGNOSIS — Z5111 Encounter for antineoplastic chemotherapy: Secondary | ICD-10-CM | POA: Diagnosis not present

## 2023-04-03 LAB — CBC WITH DIFFERENTIAL (CANCER CENTER ONLY)
Abs Immature Granulocytes: 0.01 10*3/uL (ref 0.00–0.07)
Basophils Absolute: 0 10*3/uL (ref 0.0–0.1)
Basophils Relative: 0 %
Eosinophils Absolute: 0.1 10*3/uL (ref 0.0–0.5)
Eosinophils Relative: 1 %
HCT: 29.1 % — ABNORMAL LOW (ref 36.0–46.0)
Hemoglobin: 10.2 g/dL — ABNORMAL LOW (ref 12.0–15.0)
Immature Granulocytes: 0 %
Lymphocytes Relative: 15 %
Lymphs Abs: 0.7 10*3/uL (ref 0.7–4.0)
MCH: 32.9 pg (ref 26.0–34.0)
MCHC: 35.1 g/dL (ref 30.0–36.0)
MCV: 93.9 fL (ref 80.0–100.0)
Monocytes Absolute: 0.3 10*3/uL (ref 0.1–1.0)
Monocytes Relative: 6 %
Neutro Abs: 3.7 10*3/uL (ref 1.7–7.7)
Neutrophils Relative %: 78 %
Platelet Count: 154 10*3/uL (ref 150–400)
RBC: 3.1 MIL/uL — ABNORMAL LOW (ref 3.87–5.11)
RDW: 12.6 % (ref 11.5–15.5)
WBC Count: 4.8 10*3/uL (ref 4.0–10.5)
nRBC: 0 % (ref 0.0–0.2)

## 2023-04-03 MED ORDER — SODIUM CHLORIDE 0.9 % IV SOLN
420.0000 mg | Freq: Once | INTRAVENOUS | Status: AC
  Administered 2023-04-03: 420 mg via INTRAVENOUS
  Filled 2023-04-03: qty 14

## 2023-04-03 MED ORDER — SODIUM CHLORIDE 0.9 % IV SOLN: Freq: Once | INTRAVENOUS | Status: AC

## 2023-04-03 MED ORDER — SODIUM CHLORIDE 0.9% FLUSH
10.0000 mL | Freq: Once | INTRAVENOUS | Status: AC | PRN
  Administered 2023-04-03: 10 mL

## 2023-04-03 MED ORDER — TRASTUZUMAB-ANNS CHEMO 150 MG IV SOLR
6.0000 mg/kg | Freq: Once | INTRAVENOUS | Status: AC
  Administered 2023-04-03: 420 mg via INTRAVENOUS
  Filled 2023-04-03: qty 20

## 2023-04-03 MED ORDER — DIPHENHYDRAMINE HCL 25 MG PO CAPS
50.0000 mg | ORAL_CAPSULE | Freq: Once | ORAL | Status: AC
  Administered 2023-04-03: 50 mg via ORAL
  Filled 2023-04-03: qty 2

## 2023-04-03 MED ORDER — SODIUM CHLORIDE 0.9% FLUSH
10.0000 mL | INTRAVENOUS | Status: DC | PRN
  Administered 2023-04-03: 10 mL

## 2023-04-03 MED ORDER — HEPARIN SOD (PORK) LOCK FLUSH 100 UNIT/ML IV SOLN
500.0000 [IU] | Freq: Once | INTRAVENOUS | Status: AC | PRN
  Administered 2023-04-03: 500 [IU]

## 2023-04-03 MED ORDER — ACETAMINOPHEN 325 MG PO TABS
650.0000 mg | ORAL_TABLET | Freq: Once | ORAL | Status: AC
  Administered 2023-04-03: 650 mg via ORAL
  Filled 2023-04-03: qty 2

## 2023-04-03 NOTE — Patient Instructions (Signed)
[place] [facility]  Discharge Instructions: Thank you for choosing [facility] to provide your oncology and hematology care.   If you have a lab appointment with the Cancer Center, please go directly to the Cancer Center and check in at the registration area.   Wear comfortable clothing and clothing appropriate for easy access to any Portacath or PICC line.   We strive to give you quality time with your provider. You may need to reschedule your appointment if you arrive late (15 or more minutes).  Arriving late affects you and other patients whose appointments are after yours.  Also, if you miss three or more appointments without notifying the office, you may be dismissed from the clinic at the provider's discretion.      For prescription refill requests, have your pharmacy contact our office and allow 72 hours for refills to be completed.    Today you received the following chemotherapy and/or immunotherapy agents: trastuzumab/pertuzumab.      To help prevent nausea and vomiting after your treatment, we encourage you to take your nausea medication as directed.  BELOW ARE SYMPTOMS THAT SHOULD BE REPORTED IMMEDIATELY: *FEVER GREATER THAN 100.4 F (38 C) OR HIGHER *CHILLS OR SWEATING *NAUSEA AND VOMITING THAT IS NOT CONTROLLED WITH YOUR NAUSEA MEDICATION *UNUSUAL SHORTNESS OF BREATH *UNUSUAL BRUISING OR BLEEDING *URINARY PROBLEMS (pain or burning when urinating, or frequent urination) *BOWEL PROBLEMS (unusual diarrhea, constipation, pain near the anus) TENDERNESS IN MOUTH AND THROAT WITH OR WITHOUT PRESENCE OF ULCERS (sore throat, sores in mouth, or a toothache) UNUSUAL RASH, SWELLING OR PAIN  UNUSUAL VAGINAL DISCHARGE OR ITCHING   Items with * indicate a potential emergency and should be followed up as soon as possible or go to the Emergency Department if any problems should occur.  Please show the CHEMOTHERAPY ALERT CARD or IMMUNOTHERAPY ALERT  CARD at check-in to the Emergency Department and triage nurse.  Should you have questions after your visit or need to cancel or reschedule your appointment, please contact [place] [facility]  Dept: [phone]  and follow the prompts.  Office hours are 8:00 a.m. to 4:30 p.m. Monday - Friday. Please note that voicemails left after 4:00 p.m. may not be returned until the following business day.  We are closed weekends and major holidays. You have access to a nurse at all times for urgent questions. Please call the main number to the clinic Dept: [phone] and follow the prompts.   For any non-urgent questions, you may also contact your provider using MyChart. We now offer e-Visits for anyone 82 and older to request care online for non-urgent symptoms. For details visit [email or web address].   Also download the MyChart app! Go to the app store, search "MyChart", open the app, select [place], and log in with your MyChart username and password.

## 2023-04-03 NOTE — Progress Notes (Signed)
Patient declined 30 minuted post observation period.  VSS.  No s/s or c/o distress or discomfort.  Ambulatory at discharge.

## 2023-04-04 ENCOUNTER — Encounter: Payer: Self-pay | Admitting: Hematology and Oncology

## 2023-04-23 ENCOUNTER — Encounter: Payer: Self-pay | Admitting: Hematology and Oncology

## 2023-04-24 ENCOUNTER — Inpatient Hospital Stay: Payer: 59

## 2023-04-24 ENCOUNTER — Encounter: Payer: Self-pay | Admitting: *Deleted

## 2023-04-24 ENCOUNTER — Encounter: Payer: Self-pay | Admitting: Hematology and Oncology

## 2023-04-24 ENCOUNTER — Inpatient Hospital Stay (HOSPITAL_BASED_OUTPATIENT_CLINIC_OR_DEPARTMENT_OTHER): Payer: 59 | Admitting: Hematology and Oncology

## 2023-04-24 ENCOUNTER — Inpatient Hospital Stay: Payer: 59 | Attending: Hematology and Oncology

## 2023-04-24 DIAGNOSIS — D649 Anemia, unspecified: Secondary | ICD-10-CM | POA: Diagnosis not present

## 2023-04-24 DIAGNOSIS — C50412 Malignant neoplasm of upper-outer quadrant of left female breast: Secondary | ICD-10-CM | POA: Diagnosis not present

## 2023-04-24 DIAGNOSIS — Z170 Estrogen receptor positive status [ER+]: Secondary | ICD-10-CM

## 2023-04-24 DIAGNOSIS — Z5111 Encounter for antineoplastic chemotherapy: Secondary | ICD-10-CM | POA: Insufficient documentation

## 2023-04-24 DIAGNOSIS — Z79620 Long term (current) use of immunosuppressive biologic: Secondary | ICD-10-CM | POA: Insufficient documentation

## 2023-04-24 DIAGNOSIS — Z1721 Progesterone receptor positive status: Secondary | ICD-10-CM | POA: Diagnosis not present

## 2023-04-24 DIAGNOSIS — N920 Excessive and frequent menstruation with regular cycle: Secondary | ICD-10-CM | POA: Insufficient documentation

## 2023-04-24 DIAGNOSIS — Z5112 Encounter for antineoplastic immunotherapy: Secondary | ICD-10-CM | POA: Diagnosis not present

## 2023-04-24 DIAGNOSIS — K1230 Oral mucositis (ulcerative), unspecified: Secondary | ICD-10-CM | POA: Diagnosis not present

## 2023-04-24 LAB — CBC WITH DIFFERENTIAL (CANCER CENTER ONLY)
Abs Immature Granulocytes: 0 10*3/uL (ref 0.00–0.07)
Basophils Absolute: 0 10*3/uL (ref 0.0–0.1)
Basophils Relative: 0 %
Eosinophils Absolute: 0.1 10*3/uL (ref 0.0–0.5)
Eosinophils Relative: 2 %
HCT: 24.7 % — ABNORMAL LOW (ref 36.0–46.0)
Hemoglobin: 8.3 g/dL — ABNORMAL LOW (ref 12.0–15.0)
Immature Granulocytes: 0 %
Lymphocytes Relative: 32 %
Lymphs Abs: 0.9 10*3/uL (ref 0.7–4.0)
MCH: 30.5 pg (ref 26.0–34.0)
MCHC: 33.6 g/dL (ref 30.0–36.0)
MCV: 90.8 fL (ref 80.0–100.0)
Monocytes Absolute: 0.2 10*3/uL (ref 0.1–1.0)
Monocytes Relative: 9 %
Neutro Abs: 1.5 10*3/uL — ABNORMAL LOW (ref 1.7–7.7)
Neutrophils Relative %: 57 %
Platelet Count: 147 10*3/uL — ABNORMAL LOW (ref 150–400)
RBC: 2.72 MIL/uL — ABNORMAL LOW (ref 3.87–5.11)
RDW: 11.6 % (ref 11.5–15.5)
WBC Count: 2.7 10*3/uL — ABNORMAL LOW (ref 4.0–10.5)
nRBC: 0 % (ref 0.0–0.2)

## 2023-04-24 LAB — CMP (CANCER CENTER ONLY)
ALT: 6 U/L (ref 0–44)
AST: 9 U/L — ABNORMAL LOW (ref 15–41)
Albumin: 3.5 g/dL (ref 3.5–5.0)
Alkaline Phosphatase: 41 U/L (ref 38–126)
Anion gap: 5 (ref 5–15)
BUN: 13 mg/dL (ref 6–20)
CO2: 25 mmol/L (ref 22–32)
Calcium: 8.6 mg/dL — ABNORMAL LOW (ref 8.9–10.3)
Chloride: 109 mmol/L (ref 98–111)
Creatinine: 1.11 mg/dL — ABNORMAL HIGH (ref 0.44–1.00)
GFR, Estimated: >60 mL/min (ref >60)
Glucose, Bld: 98 mg/dL (ref 70–99)
Potassium: 3.5 mmol/L (ref 3.5–5.1)
Sodium: 139 mmol/L (ref 135–145)
Total Bilirubin: 0.3 mg/dL (ref 0.3–1.2)
Total Protein: 6.2 g/dL — ABNORMAL LOW (ref 6.5–8.1)

## 2023-04-24 LAB — IRON AND IRON BINDING CAPACITY (CC-WL,HP ONLY)
Iron: 39 ug/dL (ref 28–170)
Saturation Ratios: 11 % (ref 10.4–31.8)
TIBC: 364 ug/dL (ref 250–450)
UIBC: 325 ug/dL (ref 148–442)

## 2023-04-24 LAB — VITAMIN B12: Vitamin B-12: 123 pg/mL — ABNORMAL LOW (ref 180–914)

## 2023-04-24 LAB — TSH: TSH: 1.951 u[IU]/mL (ref 0.350–4.500)

## 2023-04-24 LAB — FERRITIN: Ferritin: 4 ng/mL — ABNORMAL LOW (ref 11–307)

## 2023-04-24 MED ORDER — SODIUM CHLORIDE 0.9 % IV SOLN
420.0000 mg | Freq: Once | INTRAVENOUS | Status: AC
  Administered 2023-04-24: 420 mg via INTRAVENOUS
  Filled 2023-04-24: qty 14

## 2023-04-24 MED ORDER — SODIUM CHLORIDE 0.9% FLUSH
10.0000 mL | Freq: Once | INTRAVENOUS | Status: AC
  Administered 2023-04-24: 10 mL

## 2023-04-24 MED ORDER — HEPARIN SOD (PORK) LOCK FLUSH 100 UNIT/ML IV SOLN
500.0000 [IU] | Freq: Once | INTRAVENOUS | Status: AC | PRN
  Administered 2023-04-24: 500 [IU]

## 2023-04-24 MED ORDER — TRASTUZUMAB-ANNS CHEMO 150 MG IV SOLR
6.0000 mg/kg | Freq: Once | INTRAVENOUS | Status: AC
  Administered 2023-04-24: 420 mg via INTRAVENOUS
  Filled 2023-04-24: qty 20

## 2023-04-24 MED ORDER — SODIUM CHLORIDE 0.9 % IV SOLN: Freq: Once | INTRAVENOUS | Status: AC

## 2023-04-24 MED ORDER — ACETAMINOPHEN 325 MG PO TABS
650.0000 mg | ORAL_TABLET | Freq: Once | ORAL | Status: AC
  Administered 2023-04-24: 650 mg via ORAL
  Filled 2023-04-24: qty 2

## 2023-04-24 MED ORDER — DIPHENHYDRAMINE HCL 25 MG PO CAPS: 50.0000 mg | ORAL_CAPSULE | Freq: Once | ORAL | Status: DC

## 2023-04-24 MED ORDER — SODIUM CHLORIDE 0.9% FLUSH
10.0000 mL | INTRAVENOUS | Status: DC | PRN
  Administered 2023-04-24: 10 mL

## 2023-04-24 NOTE — Patient Instructions (Signed)
[place] [facility]  Discharge Instructions: Thank you for choosing [facility] to provide your oncology and hematology care.   If you have a lab appointment with the Cancer Center, please go directly to the Cancer Center and check in at the registration area.   Wear comfortable clothing and clothing appropriate for easy access to any Portacath or PICC line.   We strive to give you quality time with your provider. You may need to reschedule your appointment if you arrive late (15 or more minutes).  Arriving late affects you and other patients whose appointments are after yours.  Also, if you miss three or more appointments without notifying the office, you may be dismissed from the clinic at the provider's discretion.      For prescription refill requests, have your pharmacy contact our office and allow 72 hours for refills to be completed.    Today you received the following chemotherapy and/or immunotherapy agents: trastuzumab/pertuzumab.      To help prevent nausea and vomiting after your treatment, we encourage you to take your nausea medication as directed.  BELOW ARE SYMPTOMS THAT SHOULD BE REPORTED IMMEDIATELY: *FEVER GREATER THAN 100.4 F (38 C) OR HIGHER *CHILLS OR SWEATING *NAUSEA AND VOMITING THAT IS NOT CONTROLLED WITH YOUR NAUSEA MEDICATION *UNUSUAL SHORTNESS OF BREATH *UNUSUAL BRUISING OR BLEEDING *URINARY PROBLEMS (pain or burning when urinating, or frequent urination) *BOWEL PROBLEMS (unusual diarrhea, constipation, pain near the anus) TENDERNESS IN MOUTH AND THROAT WITH OR WITHOUT PRESENCE OF ULCERS (sore throat, sores in mouth, or a toothache) UNUSUAL RASH, SWELLING OR PAIN  UNUSUAL VAGINAL DISCHARGE OR ITCHING   Items with * indicate a potential emergency and should be followed up as soon as possible or go to the Emergency Department if any problems should occur.  Please show the CHEMOTHERAPY ALERT CARD or IMMUNOTHERAPY ALERT  CARD at check-in to the Emergency Department and triage nurse.  Should you have questions after your visit or need to cancel or reschedule your appointment, please contact [place] [facility]  Dept: [phone]  and follow the prompts.  Office hours are 8:00 a.m. to 4:30 p.m. Monday - Friday. Please note that voicemails left after 4:00 p.m. may not be returned until the following business day.  We are closed weekends and major holidays. You have access to a nurse at all times for urgent questions. Please call the main number to the clinic Dept: [phone] and follow the prompts.   For any non-urgent questions, you may also contact your provider using MyChart. We now offer e-Visits for anyone 46 and older to request care online for non-urgent symptoms. For details visit [email or web address].   Also download the MyChart app! Go to the app store, search "MyChart", open the app, select [place], and log in with your MyChart username and password.

## 2023-04-24 NOTE — Progress Notes (Signed)
[facility] Cancer Follow up:    [name] [name], [name], DO [address] [place] [address]   DIAGNOSIS:  Cancer Staging  Malignant neoplasm of upper-outer quadrant of left breast in female, estrogen receptor positive (HCC) Staging form: Breast, AJCC 8th Edition - Clinical stage from 05/08/2022: Stage IB (cT2, cN0, cM0, G2, ER+, PR+, HER2+) - Signed by Ronny Bacon, PA-C on 05/08/2022 Stage prefix: Initial diagnosis Method of lymph node assessment: Clinical Histologic grading system: 3 grade system - Pathologic stage from 11/07/2022: No Stage Recommended (ypT0, pN0, cM0) - Signed by Loa Socks, NP on 11/28/2022 Stage prefix: Post-therapy   SUMMARY OF ONCOLOGIC HISTORY: Oncology History  Malignant neoplasm of upper-outer quadrant of left breast in female, estrogen receptor positive (HCC)  04/25/2022 Imaging   Patient had diagnostic bilateral mammogram given palpable mass in the left breast.  This showed a 2.6 x 3.5 x 3 cm irregular mass as well as possible satellite mass which is indeterminate.  An ultrasound was recommended.  Ultrasound of the left breast mass showed 3.9 x 2.4 x 2.3 cm irregular mass with an irregular and spiculated margin at 3:00 middle depth 5 cm from the nipple, irregular mass is hypoechoic.  In the left breast at 1 o'clock position, 3 cm from the nipple there is a round mass measuring 0.3 x 0.2 x 0.3 cm.  No significant abnormalities were seen sonographically in the left axilla.  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  Left breast needle core biopsy at 1:00 3 cm from the nipple showed fibrocystic changes with usual ductal hyperplasia negative for malignancy.  Prognostic showed ER 95% PR 40%,  HER2 3+ KI of 50%   05/08/2022 Cancer Staging   Staging form: Breast, AJCC 8th Edition - Clinical stage from 05/08/2022: Stage IB (cT2, cN0, cM0, G2, ER+, PR+, HER2+) - Signed by Ronny Bacon, PA-C on 05/08/2022 Stage prefix: Initial diagnosis Method of lymph node assessment: Clinical Histologic grading system: 3 grade system   05/18/2022 - 09/16/2022 Chemotherapy   Patient is on Treatment Plan : BREAST  Docetaxel + Carboplatin + Trastuzumab + Pertuzumab  (TCHP) q21d      05/23/2022 Genetic Testing   Negative genetics for [facility] CustomNext-Cancer +RNAinsight Panel.  VUS in ATM at [name] ([id number]). Report date is 05/23/2022.   The CustomNext-Cancer+RNAinsight panel offered by [employer] [facility] includes sequencing and rearrangement analysis for the following 47 genes:  APC, ATM, AXIN2, BARD1, BMPR1A, BRCA1, BRCA2, BRIP1, CDH1, CDK4, CDKN2A, CHEK2, DICER1, EPCAM, GREM1, HOXB13, MEN1, MLH1, MSH2, MSH3, MSH6, MUTYH, NBN, NF1, NF2, NTHL1, PALB2, PMS2, POLD1, POLE, PTEN, RAD51C, RAD51D, RECQL, RET, SDHA, SDHAF2, SDHB, SDHC, SDHD, SMAD4, SMARCA4, STK11, TP53, TSC1, TSC2, and VHL.  RNA data is routinely analyzed for use in variant interpretation for all genes.   10/17/2022 -  Chemotherapy   Patient is on Treatment Plan : BREAST Trastuzumab + Pertuzumab q21d x 11 cycles     11/07/2022 Surgery   Left breast lumpectomy: No residual invasive carcinoma or DCIS.  3 SLN negative for cancer, ypT0,pN0   11/07/2022 Cancer Staging   Staging form: Breast, AJCC 8th Edition - Pathologic stage from 11/07/2022: No Stage Recommended (ypT0, pN0, cM0) - Signed by Loa Socks, NP on  11/28/2022 Stage prefix: Post-therapy   12/18/2022 - 01/16/2023 Radiation Therapy   Plan Name: Breast_L_BH Site: Breast, Left Technique: 3D Mode: Photon Dose Per Fraction: 2.66 Gy Prescribed Dose (Delivered / Prescribed): 42.56 Gy / 42.56 Gy Prescribed Fxs (Delivered / Prescribed): 16 / 16   Plan Name:  Brst_L_Bst_BH Site: Breast, Left Technique: 3D Mode: Photon Dose Per Fraction: 2 Gy Prescribed Dose (Delivered / Prescribed): 8 Gy / 8 Gy Prescribed Fxs (Delivered / Prescribed): 4 / 4     02/2023 -  Anti-estrogen oral therapy   Tamoxifen daily     CURRENT THERAPY: Tamoxifen/Herceptin/Perjeta  INTERVAL HISTORY:  [name] [name] 47 y.o. female returns for follow-up prior to receiving Herceptin Perjeta and tamoxifen.  The patient, with a history of cancer and recent completion of chemotherapy, presents with mouth sores, lightheadedness, dizziness, weakness, and shortness of breath. The mouth sores are sporadic and painful, located at the back of the throat and on the tongue. The patient attributes these sores to the chemotherapy and other chemicals in her body.  The patient also reports feeling lightheaded, dizzy, and weak, similar to when she was first diagnosed with cancer. She denies starting any new medications, only taking tamoxifen and potassium. The patient also reports shortness of breath, particularly when walking up steps.  The patient has recently resumed menstruation, which has been heavy and frequent, requiring changes every thirty minutes. This has caused concern for the patient, who has not experienced a menstrual cycle this severe before.  The patient also reports sensitivity and tenderness in her left breast, which she attributes to recent radiation therapy.  Patient Active Problem List   Diagnosis Date Noted   Genetic testing 05/30/2022   Family history of breast cancer 05/10/2022   Family history of ovarian cancer 05/10/2022   Malignant neoplasm of upper-outer quadrant of left breast in female, estrogen receptor positive (HCC) 05/08/2022   Food impaction of esophagus    Schatzki's ring    Hx of migraine headaches 11/17/2013   Ureteral calculus 11/17/2013   GERD (gastroesophageal reflux disease) 11/17/2013   Esophageal obstruction due to food impaction  11/17/2013    is allergic to amoxicillin, coconut (cocos nucifera), penicillins, duloxetine, hydrocodone, hydrocodone-acetaminophen, and moxifloxacin.  MEDICAL HISTORY: Past Medical History:  Diagnosis Date   Anemia    history of   Breast cancer, left (HCC)    Dyspnea    even with speaking   Family history of adverse reaction to anesthesia    mother slow to awaken   Family history of breast cancer 05/10/2022   Family history of ovarian cancer 05/10/2022   Fibromyalgia    GERD (gastroesophageal reflux disease)    History of kidney stones    last stone 6 months ago    Migraine    Seasonal allergies    Tinnitus    Vertigo     SURGICAL HISTORY: Past Surgical History:  Procedure Laterality Date   ADENOIDECTOMY  1991   BREAST BIOPSY  11/06/2022   MM LT RADIOACTIVE SEED LOC MAMMO GUIDE 11/06/2022 [facility]   BREAST BIOPSY  11/06/2022   MM LT RADIOACTIVE SEED EA ADD LESION LOC MAMMO GUIDE 11/06/2022 [facility]   BREAST LUMPECTOMY WITH RADIOACTIVE SEED AND SENTINEL LYMPH NODE BIOPSY Left 11/07/2022   Procedure: LEFT BREAST LUMPECTOMY WITH RADIOACTIVE SEEDX2 AND SENTINEL LYMPH NODE BIOPSY;  Surgeon: Abigail Miyamoto, MD;  Location: [facility];  Service: General;  Laterality: Left;   CYSTOSCOPY WITH RETROGRADE PYELOGRAM, URETEROSCOPY AND STENT PLACEMENT Left 02/22/2021  Procedure: CYSTOSCOPY WITH RETROGRADE PYELOGRAM, URETEROSCOPY AND STENT PLACEMENT;  Surgeon: Belva Agee, MD;  Location: [facility];  Service: Urology;  Laterality: Left;   CYSTOSCOPY/URETEROSCOPY/HOLMIUM LASER/STENT PLACEMENT Left 03/10/2021   Procedure: CYSTOSCOPY RETROGRADE, LEFT URETEROSCOPY/ STONE BASKETRY /HOLMIUM LASER/STENT EXCHANGE;  Surgeon: Crist Fat, MD;  Location: [facility];  Service: Urology;  Laterality: Left;   ESOPHAGOGASTRODUODENOSCOPY N/A 11/17/2013   Procedure: ESOPHAGOGASTRODUODENOSCOPY (EGD);  Surgeon: Rachael Fee, MD;  Location: [facility] ENDOSCOPY;  Service:  Endoscopy;  Laterality: N/A;   ESOPHAGOGASTRODUODENOSCOPY N/A 10/15/2015   Procedure: ESOPHAGOGASTRODUODENOSCOPY (EGD);  Surgeon: Iva Boop, MD;  Location: [facility] ENDOSCOPY;  Service: Endoscopy;  Laterality: N/A;   HOLMIUM LASER APPLICATION  03/10/2021   Procedure: HOLMIUM LASER APPLICATION;  Surgeon: Crist Fat, MD;  Location: [facility];  Service: Urology;;   NASAL SEPTOPLASTY W/ TURBINOPLASTY Bilateral 03/18/2018   Procedure: BILATERAL NASAL SEPTOPLASTY WITH TURBINATE REDUCTION;  Surgeon: Flo Shanks, MD;  Location: [facility];  Service: ENT;  Laterality: Bilateral;   PORTACATH PLACEMENT Right 05/17/2022   Procedure: PORT-A-CATH INSERTION WITH ULTRASOUND GUIDANCE;  Surgeon: Abigail Miyamoto, MD;  Location: [facility];  Service: General;  Laterality: Right;   TONSILLECTOMY  1991   TUBAL LIGATION  2005    SOCIAL HISTORY: Social History   Socioeconomic History   Marital status: Single    Spouse name: Not on file   Number of children: Not on file   Years of education: Not on file   Highest education level: Not on file  Occupational History   Not on file  Tobacco Use   Smoking status: Never   Smokeless tobacco: Never  Vaping Use   Vaping status: Never Used  Substance and Sexual Activity   Alcohol use: No   Drug use: No   Sexual activity: Not on file  Other Topics Concern   Not on file  Social History Narrative   ** Merged History Encounter **       Social Determinants of Health   Financial Resource Strain: Low Risk  (05/10/2022)   Overall Financial Resource Strain (CARDIA)    Difficulty of Paying Living Expenses: Not very hard  Food Insecurity: No Food Insecurity (12/05/2022)   Hunger Vital Sign    Worried About Running Out of Food in the Last Year: Never true    Ran Out of Food in the Last Year: Never true  Transportation Needs: No Transportation Needs (12/05/2022)   PRAPARE - Administrator, Civil Service (Medical): No     Lack of Transportation (Non-Medical): No  Physical Activity: Not on file  Stress: Not on file  Social Connections: Not on file  Intimate Partner Violence: Not At Risk (12/05/2022)   Humiliation, Afraid, Rape, and Kick questionnaire    Fear of Current or Ex-Partner: No    Emotionally Abused: No    Physically Abused: No    Sexually Abused: No    FAMILY HISTORY: Family History  Problem Relation Age of Onset   Asthma Mother    Diabetes Mother    Colon polyps Mother    Ovarian cancer Maternal Grandmother 31   Diabetes Maternal Grandmother    Esophageal cancer Maternal Grandfather        d. 19   Breast cancer Other        MGM's sisters x2   Cervical cancer Cousin        mat cousin; dx <40    Review of Systems  Constitutional:  Negative for appetite change, chills, fatigue, fever and unexpected weight change.  HENT:   Negative for hearing loss, lump/mass and trouble swallowing.   Eyes:  Negative for eye problems and icterus.  Respiratory:  Negative for chest tightness, cough and shortness of breath.   Cardiovascular:  Negative for chest pain, leg swelling and palpitations.  Gastrointestinal:  Negative for abdominal distention, abdominal pain, constipation, diarrhea, nausea and vomiting.  Endocrine: Negative for hot flashes.  Genitourinary:  Negative for difficulty urinating.   Musculoskeletal:  Negative for arthralgias.  Skin:  Negative for itching and rash.  Neurological:  Negative for dizziness, extremity weakness, headaches and numbness.  Hematological:  Negative for adenopathy. Does not bruise/bleed easily.  Psychiatric/Behavioral:  Negative for depression. The patient is not nervous/anxious.       PHYSICAL EXAMINATION    Seen in infusion, see [facility] for vitals  Physical Exam Constitutional:      General: She is not in acute distress.    Appearance: Normal appearance. She is not toxic-appearing.  HENT:     Head: Normocephalic and atraumatic.     Mouth/Throat:      Mouth: Mucous membranes are moist.     Pharynx: Oropharynx is clear. No oropharyngeal exudate or posterior oropharyngeal erythema.  Eyes:     General: No scleral icterus. Cardiovascular:     Rate and Rhythm: Normal rate and regular rhythm.     Pulses: Normal pulses.     Heart sounds: Normal heart sounds.  Pulmonary:     Effort: Pulmonary effort is normal.     Breath sounds: Normal breath sounds.  Chest:     Comments: Postradiation changes in the left breast.  No palpable masses or regional adenopathy.  Right breast normal to inspection and palpation. Abdominal:     General: Abdomen is flat. Bowel sounds are normal. There is no distension.     Palpations: Abdomen is soft.     Tenderness: There is no abdominal tenderness.  Musculoskeletal:        General: No swelling.     Cervical back: Neck supple.  Lymphadenopathy:     Cervical: No cervical adenopathy.  Skin:    General: Skin is warm and dry.     Findings: No rash.  Neurological:     General: No focal deficit present.     Mental Status: She is alert.  Psychiatric:        Mood and Affect: Mood normal.        Behavior: Behavior normal.     LABORATORY DATA:  CBC    Component Value Date/Time   WBC 2.7 (L) 04/24/2023 0836   WBC 6.9 11/20/2022 1624   RBC 2.72 (L) 04/24/2023 0836   HGB 8.3 (L) 04/24/2023 0836   HCT 24.7 (L) 04/24/2023 0836   PLT 147 (L) 04/24/2023 0836   MCV 90.8 04/24/2023 0836   MCH 30.5 04/24/2023 0836   MCHC 33.6 04/24/2023 0836   RDW 11.6 04/24/2023 0836   LYMPHSABS 0.9 04/24/2023 0836   MONOABS 0.2 04/24/2023 0836   EOSABS 0.1 04/24/2023 0836   BASOSABS 0.0 04/24/2023 0836    CMP     Component Value Date/Time   NA 139 04/24/2023 0836   K 3.5 04/24/2023 0836   CL 109 04/24/2023 0836   CO2 25 04/24/2023 0836   GLUCOSE 98 04/24/2023 0836   BUN 13 04/24/2023 0836   CREATININE 1.11 (H) 04/24/2023 0836   CALCIUM 8.6 (L) 04/24/2023 0836   PROT 6.2 (L) 04/24/2023 1610  ALBUMIN 3.5 04/24/2023  0836   AST 9 (L) 04/24/2023 0836   ALT 6 04/24/2023 0836   ALKPHOS 41 04/24/2023 0836   BILITOT 0.3 04/24/2023 0836   GFRNONAA >60 04/24/2023 0836   GFRAA >90 11/04/2014 1100       ASSESSMENT and THERAPY PLAN:   Malignant neoplasm of upper-outer quadrant of left breast in female, estrogen receptor positive (HCC) Tiaria is a 47 year old woman with 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  She is status post neoadjuvant chemotherapy, lumpectomy, adjuvant radiation, maintenance Herceptin Perjeta, and tamoxifen that began in August 2024  Anemia New onset dizziness, lightheadedness, and shortness of breath. Hemoglobin dropped to 8.3, likely secondary to heavy menstrual bleeding. -Order labs to further evaluate anemia, including iron studies and B12. -Advise to start a daily multivitamin.  Menorrhagia Resumption of menstruation with heavy bleeding. Currently on tamoxifen. Patient wonders if tamoxifen will stop her menstruation, this is not expected at the age of 89.   Oral Mucositis New onset mouth sores, likely secondary to recent chemotherapy. -Continue current mouthwash regimen.  Breast Sensitivity Left breast sensitivity post-radiation. -Reassure that this is a common post-radiation symptom and should improve over time.  At risk for heart failure, most recent echo with satisfactory EF, global longitudinal strain at lower limit of normal.  Will continue to monitor this.  Proceed with Herceptin and Perjeta as planned today. Follow-up in 6 weeks.   All questions were answered. The patient knows to call the clinic with any problems, questions or concerns. We can certainly see the patient much sooner if necessary.  Total encounter time:30 minutes*in face-to-face visit time, chart review, lab review, care coordination, order entry, and documentation of the encounter time.   *Total Encounter Time as defined by the Centers for Medicare  and Medicaid Services includes, in addition to the face-to-face time of a patient visit (documented in the note above) non-face-to-face time: obtaining and reviewing outside history, ordering and reviewing medications, tests or procedures, care coordination (communications with other health care professionals or caregivers) and documentation in the medical record.

## 2023-04-24 NOTE — Assessment & Plan Note (Signed)
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  She is status post neoadjuvant chemotherapy, lumpectomy, adjuvant radiation, maintenance Herceptin Perjeta, and tamoxifen that began in August 2024  Anemia New onset dizziness, lightheadedness, and shortness of breath. Hemoglobin dropped to 8.3, likely secondary to heavy menstrual bleeding. -Order labs to further evaluate anemia, including iron studies and B12. -Advise to start a daily multivitamin.  Menorrhagia Resumption of menstruation with heavy bleeding. Currently on tamoxifen. Patient wonders if tamoxifen will stop her menstruation, this is not expected at the age of 71.   Oral Mucositis New onset mouth sores, likely secondary to recent chemotherapy. -Continue current mouthwash regimen.  Breast Sensitivity Left breast sensitivity post-radiation. -Reassure that this is a common post-radiation symptom and should improve over time.  At risk for heart failure, most recent echo with satisfactory EF, global longitudinal strain at lower limit of normal.  Will continue to monitor this.  Proceed with Herceptin and Perjeta as planned today. Follow-up in 6 weeks.

## 2023-04-25 ENCOUNTER — Telehealth: Payer: Self-pay

## 2023-04-25 LAB — FOLATE RBC
Folate, Hemolysate: 276 ng/mL
Folate, RBC: 1140 ng/mL (ref >498)
Hematocrit: 24.2 % — ABNORMAL LOW (ref 34.0–46.6)

## 2023-04-25 NOTE — Telephone Encounter (Signed)
Called pt per MD to give below instructions. She verbalized agreement and understanding. She knows to call back with any concerns.

## 2023-04-25 NOTE — Telephone Encounter (Signed)
-----   Message from [place] I[place] sent at 04/25/2023  8:13 AM EDT ----- Iron and B12 severely low. She needs to start B12 supplementation right away 1000 mcg daily and ferrous sulfate 65/325 mg every day.  Thanks,

## 2023-05-14 ENCOUNTER — Encounter: Payer: Self-pay | Admitting: Adult Health

## 2023-05-14 ENCOUNTER — Inpatient Hospital Stay: Payer: 59

## 2023-05-14 ENCOUNTER — Inpatient Hospital Stay: Payer: 59 | Attending: Hematology and Oncology | Admitting: Adult Health

## 2023-05-14 DIAGNOSIS — C50412 Malignant neoplasm of upper-outer quadrant of left female breast: Secondary | ICD-10-CM

## 2023-05-14 DIAGNOSIS — Z5111 Encounter for antineoplastic chemotherapy: Secondary | ICD-10-CM | POA: Insufficient documentation

## 2023-05-14 DIAGNOSIS — Z79810 Long term (current) use of selective estrogen receptor modulators (SERMs): Secondary | ICD-10-CM | POA: Diagnosis not present

## 2023-05-14 DIAGNOSIS — Z5112 Encounter for antineoplastic immunotherapy: Secondary | ICD-10-CM | POA: Insufficient documentation

## 2023-05-14 DIAGNOSIS — Z79620 Long term (current) use of immunosuppressive biologic: Secondary | ICD-10-CM | POA: Diagnosis not present

## 2023-05-14 DIAGNOSIS — Z1721 Progesterone receptor positive status: Secondary | ICD-10-CM | POA: Insufficient documentation

## 2023-05-14 DIAGNOSIS — Z803 Family history of malignant neoplasm of breast: Secondary | ICD-10-CM | POA: Insufficient documentation

## 2023-05-14 DIAGNOSIS — Z170 Estrogen receptor positive status [ER+]: Secondary | ICD-10-CM | POA: Insufficient documentation

## 2023-05-14 DIAGNOSIS — Z8041 Family history of malignant neoplasm of ovary: Secondary | ICD-10-CM | POA: Insufficient documentation

## 2023-05-14 DIAGNOSIS — Z1731 Human epidermal growth factor receptor 2 positive status: Secondary | ICD-10-CM | POA: Insufficient documentation

## 2023-05-14 LAB — CBC WITH DIFFERENTIAL (CANCER CENTER ONLY)
Abs Immature Granulocytes: 0 10*3/uL (ref 0.00–0.07)
Basophils Absolute: 0 10*3/uL (ref 0.0–0.1)
Basophils Relative: 1 %
Eosinophils Absolute: 0 10*3/uL (ref 0.0–0.5)
Eosinophils Relative: 2 %
HCT: 24.7 % — ABNORMAL LOW (ref 36.0–46.0)
Hemoglobin: 8.3 g/dL — ABNORMAL LOW (ref 12.0–15.0)
Immature Granulocytes: 0 %
Lymphocytes Relative: 33 %
Lymphs Abs: 0.7 10*3/uL (ref 0.7–4.0)
MCH: 30.2 pg (ref 26.0–34.0)
MCHC: 33.6 g/dL (ref 30.0–36.0)
MCV: 89.8 fL (ref 80.0–100.0)
Monocytes Absolute: 0.2 10*3/uL (ref 0.1–1.0)
Monocytes Relative: 9 %
Neutro Abs: 1.2 10*3/uL — ABNORMAL LOW (ref 1.7–7.7)
Neutrophils Relative %: 55 %
Platelet Count: 146 10*3/uL — ABNORMAL LOW (ref 150–400)
RBC: 2.75 MIL/uL — ABNORMAL LOW (ref 3.87–5.11)
RDW: 13.1 % (ref 11.5–15.5)
WBC Count: 2.2 10*3/uL — ABNORMAL LOW (ref 4.0–10.5)
nRBC: 0 % (ref 0.0–0.2)

## 2023-05-14 LAB — CMP (CANCER CENTER ONLY)
ALT: 5 U/L (ref 0–44)
AST: 9 U/L — ABNORMAL LOW (ref 15–41)
Albumin: 3.5 g/dL (ref 3.5–5.0)
Alkaline Phosphatase: 44 U/L (ref 38–126)
Anion gap: 3 — ABNORMAL LOW (ref 5–15)
BUN: 11 mg/dL (ref 6–20)
CO2: 28 mmol/L (ref 22–32)
Calcium: 8.4 mg/dL — ABNORMAL LOW (ref 8.9–10.3)
Chloride: 108 mmol/L (ref 98–111)
Creatinine: 1.07 mg/dL — ABNORMAL HIGH (ref 0.44–1.00)
GFR, Estimated: >60 mL/min (ref >60)
Glucose, Bld: 105 mg/dL — ABNORMAL HIGH (ref 70–99)
Potassium: 3.4 mmol/L — ABNORMAL LOW (ref 3.5–5.1)
Sodium: 139 mmol/L (ref 135–145)
Total Bilirubin: 0.3 mg/dL (ref <1.2)
Total Protein: 6.1 g/dL — ABNORMAL LOW (ref 6.5–8.1)

## 2023-05-14 MED ORDER — SODIUM CHLORIDE 0.9% FLUSH
10.0000 mL | Freq: Once | INTRAVENOUS | Status: AC
  Administered 2023-05-14: 10 mL

## 2023-05-14 MED ORDER — SODIUM CHLORIDE 0.9 % IV SOLN
Freq: Once | INTRAVENOUS | Status: AC
Start: 2023-05-14 — End: 2023-05-14

## 2023-05-14 MED ORDER — DIPHENHYDRAMINE HCL 25 MG PO CAPS
50.0000 mg | ORAL_CAPSULE | Freq: Once | ORAL | Status: DC
Start: 2023-05-14 — End: 2023-05-14

## 2023-05-14 MED ORDER — SODIUM CHLORIDE 0.9% FLUSH
10.0000 mL | INTRAVENOUS | Status: DC | PRN
  Administered 2023-05-14: 10 mL

## 2023-05-14 MED ORDER — SODIUM CHLORIDE 0.9 % IV SOLN
420.0000 mg | Freq: Once | INTRAVENOUS | Status: AC
  Administered 2023-05-14: 420 mg via INTRAVENOUS
  Filled 2023-05-14: qty 14

## 2023-05-14 MED ORDER — HEPARIN SOD (PORK) LOCK FLUSH 100 UNIT/ML IV SOLN
500.0000 [IU] | Freq: Once | INTRAVENOUS | Status: AC | PRN
  Administered 2023-05-14: 500 [IU]

## 2023-05-14 MED ORDER — ACETAMINOPHEN 325 MG PO TABS
650.0000 mg | ORAL_TABLET | Freq: Once | ORAL | Status: AC
  Administered 2023-05-14: 650 mg via ORAL
  Filled 2023-05-14: qty 2

## 2023-05-14 MED ORDER — TRASTUZUMAB-ANNS CHEMO 150 MG IV SOLR
6.0000 mg/kg | Freq: Once | INTRAVENOUS | Status: AC
  Administered 2023-05-14: 420 mg via INTRAVENOUS
  Filled 2023-05-14: qty 20

## 2023-05-14 NOTE — Progress Notes (Signed)
SURVIVORSHIP VISIT:  BRIEF ONCOLOGIC HISTORY:  Oncology History  Malignant neoplasm of upper-outer quadrant of left breast in female, estrogen receptor positive (HCC)  04/25/2022 Imaging   Patient had diagnostic bilateral mammogram given palpable mass in the left breast.  This showed a 2.6 x 3.5 x 3 cm irregular mass as well as possible satellite mass which is indeterminate.  An ultrasound was recommended.  Ultrasound of the left breast mass showed 3.9 x 2.4 x 2.3 cm irregular mass with an irregular and spiculated margin at 3:00 middle depth 5 cm from the nipple, irregular mass is hypoechoic.  In the left breast at 1 o'clock position, 3 cm from the nipple there is a round mass measuring 0.3 x 0.2 x 0.3 cm.  No significant abnormalities were seen sonographically in the left axilla.  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  Left breast needle core biopsy at 1:00 3 cm from the nipple showed fibrocystic changes with usual ductal hyperplasia negative for malignancy.  Prognostic showed ER 95% PR 40%, HER2 3+ KI of 50%   05/08/2022 Cancer Staging   Staging form: Breast, AJCC 8th Edition - Clinical stage from 05/08/2022: Stage IB (cT2, cN0, cM0, G2, ER+, PR+, HER2+) - Signed by Ronny Bacon, PA-C on 05/08/2022 Stage prefix: Initial diagnosis Method of lymph node assessment: Clinical Histologic grading system: 3 grade system   05/18/2022 - 09/16/2022 Chemotherapy   Patient is on Treatment Plan : BREAST  Docetaxel + Carboplatin + Trastuzumab + Pertuzumab  (TCHP) q21d      05/23/2022 Genetic Testing   Negative genetics for [facility] CustomNext-Cancer +RNAinsight Panel.  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 Report date is 05/23/2022.   The  CustomNext-Cancer+RNAinsight panel offered by [facility] includes sequencing and rearrangement analysis for the following 47 genes:  APC, ATM, AXIN2, BARD1, BMPR1A, BRCA1, BRCA2, BRIP1, CDH1, CDK4, CDKN2A, CHEK2, DICER1, EPCAM, GREM1, HOXB13, MEN1, MLH1, MSH2, MSH3, MSH6, MUTYH, NBN, NF1, NF2, NTHL1, PALB2, PMS2, POLD1, POLE, PTEN, RAD51C, RAD51D, RECQL, RET, SDHA, SDHAF2, SDHB, SDHC, SDHD, SMAD4, SMARCA4, STK11, TP53, TSC1, TSC2, and VHL.  RNA data is routinely analyzed for use in variant interpretation for all genes.   10/17/2022 -  Chemotherapy   Patient is on Treatment Plan : BREAST Trastuzumab + Pertuzumab q21d x 11 cycles     11/07/2022 Surgery   Left breast lumpectomy: No residual invasive carcinoma or DCIS.  3 SLN negative for cancer, ypT0,pN0   11/07/2022 Cancer Staging   Staging form: Breast, AJCC 8th Edition - Pathologic stage from 11/07/2022: No Stage Recommended (ypT0, pN0, cM0) - Signed by Loa Socks, NP on 11/28/2022 Stage prefix: Post-therapy   12/18/2022 - 01/16/2023 Radiation Therapy   Plan Name: Breast_L_BH Site: Breast, Left Technique: 3D Mode: Photon Dose Per Fraction: 2.66 Gy Prescribed Dose (Delivered / Prescribed): 42.56 Gy / 42.56 Gy Prescribed Fxs (Delivered / Prescribed): 16 / 16   Plan Name: Brst_L_Bst_BH Site: Breast, Left Technique: 3D Mode: Photon Dose Per Fraction: 2 Gy Prescribed Dose (Delivered / Prescribed): 8 Gy / 8 Gy Prescribed Fxs (Delivered / Prescribed): 4 / 4     02/2023 -  Anti-estrogen oral therapy   Tamoxifen daily     INTERVAL HISTORY:  Ms. Zappia to review her survivorship care plan  detailing her treatment course for breast cancer, as well as monitoring long-term side effects of that treatment, education regarding health maintenance, screening, and overall wellness and health promotion.     Overall, Ms. Stefanski reports feeling quite well.  She continues on Herceptin every 3 weeks and will finish this next week.  She is  tolerating tamoxifen well and denies any side effects from this.  Overall she feels she is eating a healthy well-balanced diet and getting plenty of activity.  She denies any questions or concerns today.  REVIEW OF SYSTEMS:  Review of Systems  Constitutional:  Negative for appetite change, chills, fatigue, fever and unexpected weight change.  HENT:   Negative for hearing loss, lump/mass and trouble swallowing.   Eyes:  Negative for eye problems and icterus.  Respiratory:  Negative for chest tightness, cough and shortness of breath.   Cardiovascular:  Negative for chest pain, leg swelling and palpitations.  Gastrointestinal:  Negative for abdominal distention, abdominal pain, constipation, diarrhea, nausea and vomiting.  Endocrine: Negative for hot flashes.  Genitourinary:  Negative for difficulty urinating.   Musculoskeletal:  Negative for arthralgias.  Skin:  Negative for itching and rash.  Neurological:  Negative for dizziness, extremity weakness, headaches and numbness.  Hematological:  Negative for adenopathy. Does not bruise/bleed easily.  Psychiatric/Behavioral:  Negative for depression. The patient is not nervous/anxious.    Breast: Denies any new nodularity, masses, tenderness, nipple changes, or nipple discharge.       PAST MEDICAL/SURGICAL HISTORY:  Past Medical History:  Diagnosis Date   Anemia    history of   Breast cancer, left (HCC)    Dyspnea    even with speaking   Family history of adverse reaction to anesthesia    mother slow to awaken   Family history of breast cancer 05/10/2022   Family history of ovarian cancer 05/10/2022   Fibromyalgia    GERD (gastroesophageal reflux disease)    History of kidney stones    last stone 6 months ago    Migraine    Seasonal allergies    Tinnitus    Vertigo    Past Surgical History:  Procedure Laterality Date   ADENOIDECTOMY  1991   BREAST BIOPSY  11/06/2022   MM LT RADIOACTIVE SEED LOC MAMMO GUIDE 11/06/2022 [facility]  MAMMOGRAPHY   BREAST BIOPSY  11/06/2022   MM LT RADIOACTIVE SEED EA ADD LESION LOC MAMMO GUIDE 11/06/2022 [facility] MAMMOGRAPHY   BREAST LUMPECTOMY WITH RADIOACTIVE SEED AND SENTINEL LYMPH NODE BIOPSY Left 11/07/2022   Procedure: LEFT BREAST LUMPECTOMY WITH RADIOACTIVE SEEDX2 AND SENTINEL LYMPH NODE BIOPSY;  Surgeon: Abigail Miyamoto, MD;  Location: [facility];  Service: General;  Laterality: Left;   CYSTOSCOPY WITH RETROGRADE PYELOGRAM, URETEROSCOPY AND STENT PLACEMENT Left 02/22/2021   Procedure: CYSTOSCOPY WITH RETROGRADE PYELOGRAM, URETEROSCOPY AND STENT PLACEMENT;  Surgeon: Belva Agee, MD;  Location: [facility];  Service: Urology;  Laterality: Left;   CYSTOSCOPY/URETEROSCOPY/HOLMIUM LASER/STENT PLACEMENT Left 03/10/2021   Procedure: CYSTOSCOPY RETROGRADE, LEFT URETEROSCOPY/ STONE BASKETRY /HOLMIUM LASER/STENT EXCHANGE;  Surgeon: Crist Fat, MD;  Location[facility];  Service: Urology;  Laterality: Left;   ESOPHAGOGASTRODUODENOSCOPY N/A 11/17/2013   Procedure: ESOPHAGOGASTRODUODENOSCOPY (EGD);  Surgeon: Rachael Fee, MD;  Location: [facility] ENDOSCOPY;  Service: Endoscopy;  Laterality: N/A;   ESOPHAGOGASTRODUODENOSCOPY N/A 10/15/2015   Procedure: ESOPHAGOGASTRODUODENOSCOPY (EGD);  Surgeon: Iva Boop, MD;  Location: [facility] ENDOSCOPY;  Service: Endoscopy;  Laterality: N/A;   HOLMIUM LASER APPLICATION  03/10/2021   Procedure: HOLMIUM LASER  APPLICATION;  Surgeon: Crist Fat, MD;  Location: [facility];  Service: Urology;;   NASAL SEPTOPLASTY W/ TURBINOPLASTY Bilateral 03/18/2018   Procedure: BILATERAL NASAL SEPTOPLASTY WITH TURBINATE REDUCTION;  Surgeon: Flo Shanks, MD;  Location: [facility];  Service: ENT;  Laterality: Bilateral;   PORTACATH PLACEMENT Right 05/17/2022   Procedure: PORT-A-CATH INSERTION WITH ULTRASOUND GUIDANCE;  Surgeon: Abigail Miyamoto, MD;  Location: [facility];  Service: General;  Laterality: Right;   TONSILLECTOMY  1991    TUBAL LIGATION  2005     ALLERGIES:  Allergies  Allergen Reactions   Amoxicillin Shortness Of Breath and Rash   Coconut (Cocos Nucifera) Anaphylaxis   Penicillins Shortness Of Breath and Rash    Has patient had a PCN reaction causing immediate rash, facial/tongue/throat swelling, SOB or lightheadedness with hypotension: yes Has patient had a PCN reaction causing severe rash involving mucus membranes or skin necrosis: no Has patient had a PCN reaction that required hospitalization no Has patient had a PCN reaction occurring within the last 10 years: no If all of the above answers are "NO", then may proceed with Cephalosporin use.    Duloxetine Other (See Comments)    Caused suicidal thoughts   Hydrocodone Itching   Hydrocodone-Acetaminophen Rash   Moxifloxacin Rash     CURRENT MEDICATIONS:  Outpatient Encounter Medications as of 05/14/2023  Medication Sig   tamoxifen (NOLVADEX) 20 MG tablet Take 1 tablet (20 mg total) by mouth daily.   lidocaine-prilocaine (EMLA) cream Apply 1 Application topically as needed.   potassium chloride (MICRO-K) 10 MEQ CR capsule Take 2 tablets bid x 3 days then take 2 tabs daily.   No facility-administered encounter medications on file as of 05/14/2023.     ONCOLOGIC FAMILY HISTORY:  Family History  Problem Relation Age of Onset   Asthma Mother    Diabetes Mother    Colon polyps Mother    Ovarian cancer Maternal Grandmother 63   Diabetes Maternal Grandmother    Esophageal cancer Maternal Grandfather        d. 48   Breast cancer Other        MGM's sisters x2   Cervical cancer Cousin        mat cousin; dx <40     SOCIAL HISTORY:  Social History   Socioeconomic History   Marital status: Single    Spouse name: Not on file   Number of children: Not on file   Years of education: Not on file   Highest education level: Not on file  Occupational History   Not on file  Tobacco Use   Smoking status: Never   Smokeless tobacco: Never   Vaping Use   Vaping status: Never Used  Substance and Sexual Activity   Alcohol use: No   Drug use: No   Sexual activity: Not on file  Other Topics Concern   Not on file  Social History Narrative   ** Merged History Encounter **       Social Determinants of Health   Financial Resource Strain: Low Risk  (05/10/2022)   Overall Financial Resource Strain (CARDIA)    Difficulty of Paying Living Expenses: Not very hard  Food Insecurity: No Food Insecurity (12/05/2022)   Hunger Vital Sign    Worried About Running Out of Food in the Last Year: Never true    Ran Out of Food in the Last Year: Never true  Transportation Needs: No Transportation Needs (12/05/2022)   PRAPARE - Transportation  Lack of Transportation (Medical): No    Lack of Transportation (Non-Medical): No  Physical Activity: Not on file  Stress: Not on file  Social Connections: Not on file  Intimate Partner Violence: Not At Risk (12/05/2022)   Humiliation, Afraid, Rape, and Kick questionnaire    Fear of Current or Ex-Partner: No    Emotionally Abused: No    Physically Abused: No    Sexually Abused: No     OBSERVATIONS/OBJECTIVE:  There were no vitals taken for this visit. GENERAL: Patient is a well appearing female in no acute distress HEENT:  Sclerae anicteric.  Oropharynx clear and moist. No ulcerations or evidence of oropharyngeal candidiasis. Neck is supple.  NODES:  No cervical, supraclavicular, or axillary lymphadenopathy palpated.  BREAST EXAM:  Deferred. LUNGS:  Clear to auscultation bilaterally.  No wheezes or rhonchi. HEART:  Regular rate and rhythm. No murmur appreciated. ABDOMEN:  Soft, nontender.  Positive, normoactive bowel sounds. No organomegaly palpated. MSK:  No focal spinal tenderness to palpation. Full range of motion bilaterally in the upper extremities. EXTREMITIES:  No peripheral edema.   SKIN:  Clear with no obvious rashes or skin changes. No nail dyscrasia. NEURO:  Nonfocal. Well oriented.   Appropriate affect.   LABORATORY DATA:  None for this visit.  DIAGNOSTIC IMAGING:  None for this visit.      ASSESSMENT AND PLAN:  Ms.. [name] is a pleasant 47 y.o. female with Stage IB left breast invasive ductal carcinoma, ER+/PR+/HER2+, diagnosed in 04/2022, treated with neoadjuvant chemotherapy, lumpectomy, adjuvant radiation therapy, and anti-estrogen therapy with Tamoxifen beginning in 02/2023.  She presents to the Survivorship Clinic for our initial meeting and routine follow-up post-completion of treatment for breast cancer.    1. Stage IB left breast cancer:  [name] [name] is continuing to recover from definitive treatment for breast cancer. She will follow-up with her medical oncologist, Dr. Al Pimple in 3 weeks with history and physical exam per surveillance protocol.  She will continue her anti-estrogen therapy with Tamoxifen. Thus far, she is tolerating the Tamoxifen well, with minimal side effects. Her mammogram is due 05/2023.   Today, a comprehensive survivorship care plan and treatment summary was reviewed with the patient today detailing her breast cancer diagnosis, treatment course, potential late/long-term effects of treatment, appropriate follow-up care with recommendations for the future, and patient education resources.  A copy of this summary, along with a letter will be sent to the patient's primary care provider via mail/fax/In Basket message after today's visit.    2.  Port-A-Cath: She will follow-up with Dr. Magnus Ivan on June 19, 2023.  I sent him a message letting him know that she completes treatment on June 05, 2023 and the port can be removed after her final treatment on that date.  3. Bone health:  Given Ms. Mccleary's age/history of breast cancer and her current treatment regimen including anti-est She was given education on specific activities to promote bone health.  4. Cancer screening:  Due to Ms. Shankel's history and her age, she should receive  screening for skin cancers, colon cancer, and gynecologic cancers.  The information and recommendations are listed on the patient's comprehensive care plan/treatment summary and were reviewed in detail with the patient.    5. Health maintenance and wellness promotion: Ms. Tuch was encouraged to consume 5-7 servings of fruits and vegetables per day. We reviewed the "Nutrition Rainbow" handout.  She was also encouraged to engage in moderate to vigorous exercise for 30 minutes per day most days of  the week.  She was instructed to limit her alcohol consumption and continue to abstain from tobacco use.     6. Support services/counseling: It is not uncommon for this period of the patient's cancer care trajectory to be one of many emotions and stressors.   She was given information regarding our available services and encouraged to contact me with any questions or for help enrolling in any of our support group/programs.    Follow up instructions:    -Return to cancer center in 3 weeks for f/u and her final Herceptin treatment  -Mammogram due in 05/2023 -Follow-up with Dr. Magnus Ivan in December 2024 -She is welcome to return back to the Survivorship Clinic at any time; no additional follow-up needed at this time.  -Consider referral back to survivorship as a long-term survivor for continued surveillance  The patient was provided an opportunity to ask questions and all were answered. The patient agreed with the plan and demonstrated an understanding of the instructions.   Total encounter time:40 minutes*in face-to-face visit time, chart review, lab review, care coordination, order entry, and documentation of the encounter time.    Lillard Anes, NP 05/14/23 10:00 AM Medical Oncology and Hematology [facility] [address] [place], [place] [address] Tel. [phone]    Fax. [phone]  *Total Encounter Time as defined by the Centers for Medicare and Medicaid Services includes,  in addition to the face-to-face time of a patient visit (documented in the note above) non-face-to-face time: obtaining and reviewing outside history, ordering and reviewing medications, tests or procedures, care coordination (communications with other health care professionals or caregivers) and documentation in the medical record.

## 2023-05-14 NOTE — Patient Instructions (Signed)
[place] [facility]  Discharge Instructions: Thank you for choosing [facility] to provide your oncology and hematology care.   If you have a lab appointment with the Cancer Center, please go directly to the Cancer Center and check in at the registration area.   Wear comfortable clothing and clothing appropriate for easy access to any Portacath or PICC line.   We strive to give you quality time with your provider. You may need to reschedule your appointment if you arrive late (15 or more minutes).  Arriving late affects you and other patients whose appointments are after yours.  Also, if you miss three or more appointments without notifying the office, you may be dismissed from the clinic at the provider's discretion.      For prescription refill requests, have your pharmacy contact our office and allow 72 hours for refills to be completed.    Today you received the following chemotherapy and/or immunotherapy agents: trastuzumab/pertuzumab.      To help prevent nausea and vomiting after your treatment, we encourage you to take your nausea medication as directed.  BELOW ARE SYMPTOMS THAT SHOULD BE REPORTED IMMEDIATELY: *FEVER GREATER THAN 100.4 F (38 C) OR HIGHER *CHILLS OR SWEATING *NAUSEA AND VOMITING THAT IS NOT CONTROLLED WITH YOUR NAUSEA MEDICATION *UNUSUAL SHORTNESS OF BREATH *UNUSUAL BRUISING OR BLEEDING *URINARY PROBLEMS (pain or burning when urinating, or frequent urination) *BOWEL PROBLEMS (unusual diarrhea, constipation, pain near the anus) TENDERNESS IN MOUTH AND THROAT WITH OR WITHOUT PRESENCE OF ULCERS (sore throat, sores in mouth, or a toothache) UNUSUAL RASH, SWELLING OR PAIN  UNUSUAL VAGINAL DISCHARGE OR ITCHING   Items with * indicate a potential emergency and should be followed up as soon as possible or go to the Emergency Department if any problems should occur.  Please show the CHEMOTHERAPY ALERT CARD or IMMUNOTHERAPY ALERT  CARD at check-in to the Emergency Department and triage nurse.  Should you have questions after your visit or need to cancel or reschedule your appointment, please contact [place] [facility]  Dept: [phone]  and follow the prompts.  Office hours are 8:00 a.m. to 4:30 p.m. Monday - Friday. Please note that voicemails left after 4:00 p.m. may not be returned until the following business day.  We are closed weekends and major holidays. You have access to a nurse at all times for urgent questions. Please call the main number to the clinic Dept: [phone] and follow the prompts.   For any non-urgent questions, you may also contact your provider using MyChart. We now offer e-Visits for anyone 82 and older to request care online for non-urgent symptoms. For details visit [email or web address].   Also download the MyChart app! Go to the app store, search "MyChart", open the app, select [place], and log in with your MyChart username and password.

## 2023-05-15 ENCOUNTER — Telehealth: Payer: Self-pay | Admitting: Hematology and Oncology

## 2023-05-15 NOTE — Telephone Encounter (Signed)
Spoke with patient confirming upcoming appointment  

## 2023-05-17 ENCOUNTER — Other Ambulatory Visit: Payer: Self-pay

## 2023-06-05 ENCOUNTER — Inpatient Hospital Stay: Payer: 59

## 2023-06-05 ENCOUNTER — Encounter: Payer: Self-pay | Admitting: *Deleted

## 2023-06-05 ENCOUNTER — Inpatient Hospital Stay (HOSPITAL_BASED_OUTPATIENT_CLINIC_OR_DEPARTMENT_OTHER): Payer: 59 | Admitting: Hematology and Oncology

## 2023-06-05 VITALS — BP 115/64 | HR 66 | Temp 98.4°F | Resp 14

## 2023-06-05 VITALS — BP 125/70 | HR 88 | Temp 97.6°F | Resp 12 | Wt 173.0 lb

## 2023-06-05 DIAGNOSIS — C50412 Malignant neoplasm of upper-outer quadrant of left female breast: Secondary | ICD-10-CM

## 2023-06-05 DIAGNOSIS — Z5111 Encounter for antineoplastic chemotherapy: Secondary | ICD-10-CM | POA: Diagnosis not present

## 2023-06-05 DIAGNOSIS — Z170 Estrogen receptor positive status [ER+]: Secondary | ICD-10-CM

## 2023-06-05 LAB — CBC WITH DIFFERENTIAL (CANCER CENTER ONLY)
Abs Immature Granulocytes: 0 10*3/uL (ref 0.00–0.07)
Basophils Absolute: 0 10*3/uL (ref 0.0–0.1)
Basophils Relative: 1 %
Eosinophils Absolute: 0 10*3/uL (ref 0.0–0.5)
Eosinophils Relative: 1 %
HCT: 27.9 % — ABNORMAL LOW (ref 36.0–46.0)
Hemoglobin: 9 g/dL — ABNORMAL LOW (ref 12.0–15.0)
Immature Granulocytes: 0 %
Lymphocytes Relative: 31 %
Lymphs Abs: 0.7 10*3/uL (ref 0.7–4.0)
MCH: 28.9 pg (ref 26.0–34.0)
MCHC: 32.3 g/dL (ref 30.0–36.0)
MCV: 89.7 fL (ref 80.0–100.0)
Monocytes Absolute: 0.2 10*3/uL (ref 0.1–1.0)
Monocytes Relative: 7 %
Neutro Abs: 1.2 10*3/uL — ABNORMAL LOW (ref 1.7–7.7)
Neutrophils Relative %: 60 %
Platelet Count: 142 10*3/uL — ABNORMAL LOW (ref 150–400)
RBC: 3.11 MIL/uL — ABNORMAL LOW (ref 3.87–5.11)
RDW: 14 % (ref 11.5–15.5)
WBC Count: 2.1 10*3/uL — ABNORMAL LOW (ref 4.0–10.5)
nRBC: 0 % (ref 0.0–0.2)

## 2023-06-05 LAB — TSH: TSH: 2.175 u[IU]/mL (ref 0.350–4.500)

## 2023-06-05 MED ORDER — HEPARIN SOD (PORK) LOCK FLUSH 100 UNIT/ML IV SOLN
500.0000 [IU] | Freq: Once | INTRAVENOUS | Status: AC | PRN
  Administered 2023-06-05: 500 [IU]

## 2023-06-05 MED ORDER — TRASTUZUMAB-ANNS CHEMO 420 MG IV SOLR
6.0000 mg/kg | Freq: Once | INTRAVENOUS | Status: AC
  Administered 2023-06-05: 420 mg via INTRAVENOUS
  Filled 2023-06-05: qty 20

## 2023-06-05 MED ORDER — SODIUM CHLORIDE 0.9% FLUSH
10.0000 mL | Freq: Once | INTRAVENOUS | Status: AC
  Administered 2023-06-05: 10 mL

## 2023-06-05 MED ORDER — SODIUM CHLORIDE 0.9% FLUSH
10.0000 mL | INTRAVENOUS | Status: DC | PRN
  Administered 2023-06-05: 10 mL

## 2023-06-05 MED ORDER — DIPHENHYDRAMINE HCL 25 MG PO CAPS
50.0000 mg | ORAL_CAPSULE | Freq: Once | ORAL | Status: AC
Start: 2023-06-05 — End: 2023-06-05
  Administered 2023-06-05: 50 mg via ORAL
  Filled 2023-06-05: qty 2

## 2023-06-05 MED ORDER — ACETAMINOPHEN 325 MG PO TABS
650.0000 mg | ORAL_TABLET | Freq: Once | ORAL | Status: AC
Start: 2023-06-05 — End: 2023-06-05
  Administered 2023-06-05: 650 mg via ORAL
  Filled 2023-06-05: qty 2

## 2023-06-05 MED ORDER — SODIUM CHLORIDE 0.9 % IV SOLN
420.0000 mg | Freq: Once | INTRAVENOUS | Status: AC
  Administered 2023-06-05: 420 mg via INTRAVENOUS
  Filled 2023-06-05: qty 14

## 2023-06-05 MED ORDER — SODIUM CHLORIDE 0.9 % IV SOLN: Freq: Once | INTRAVENOUS | Status: AC

## 2023-06-05 NOTE — Assessment & Plan Note (Signed)
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  She is status post neoadjuvant chemotherapy, lumpectomy, adjuvant radiation, maintenance Herceptin Perjeta, and tamoxifen that began in August 2024  Breast Cancer Completed treatment with surgery, radiation, and Herceptin. Currently on Tamoxifen. No new symptoms or concerns. Noted tenderness and density in the area of radiation and scar tissue, likely due to treatment changes. -Continue Tamoxifen as prescribed. -Observe area of tenderness and density, contact office if changes occur.  Port Removal Appointment scheduled with surgeon on 06/19/2023 to discuss port removal. -Continue with scheduled appointment.  Mammogram Due for annual mammogram, last performed in October 2023. -Request for mammogram to be sent to [facility]. -Patient to schedule mammogram at [facility].  Low White Blood Cell Count Current count is 2100, likely due to recent completion of Herceptin treatment. Patient has been advised to take iron pills and daily vitamins. -Return in 2-3 months for blood draw to monitor white blood cell count.  Echocardiogram Last performed in September 2024 with normal results. -Order echocardiogram for December 2024.  Follow-up No new concerns or changes in health status. -Return for follow-up visit in 6 months.

## 2023-06-05 NOTE — Patient Instructions (Signed)
[facility] - A DEPT OF [facility]  Discharge Instructions: Thank you for choosing [facility] to provide your oncology and hematology care.   If you have a lab appointment with the Cancer Center, please go directly to the Cancer Center and check in at the registration area.   Wear comfortable clothing and clothing appropriate for easy access to any Portacath or PICC line.   We strive to give you quality time with your provider. You may need to reschedule your appointment if you arrive late (15 or more minutes).  Arriving late affects you and other patients whose appointments are after yours.  Also, if you miss three or more appointments without notifying the office, you may be dismissed from the clinic at the provider's discretion.      For prescription refill requests, have your pharmacy contact our office and allow 72 hours for refills to be completed.    Today you received the following chemotherapy and/or immunotherapy agents: trastuzumab/pertuzumab.      To help prevent nausea and vomiting after your treatment, we encourage you to take your nausea medication as directed.  BELOW ARE SYMPTOMS THAT SHOULD BE REPORTED IMMEDIATELY: *FEVER GREATER THAN 100.4 F (38 C) OR HIGHER *CHILLS OR SWEATING *NAUSEA AND VOMITING THAT IS NOT CONTROLLED WITH YOUR NAUSEA MEDICATION *UNUSUAL SHORTNESS OF BREATH *UNUSUAL BRUISING OR BLEEDING *URINARY PROBLEMS (pain or burning when urinating, or frequent urination) *BOWEL PROBLEMS (unusual diarrhea, constipation, pain near the anus) TENDERNESS IN MOUTH AND THROAT WITH OR WITHOUT PRESENCE OF ULCERS (sore throat, sores in mouth, or a toothache) UNUSUAL RASH, SWELLING OR PAIN  UNUSUAL VAGINAL DISCHARGE OR ITCHING   Items with * indicate a potential emergency and should be followed up as soon as possible or go to the Emergency Department if any problems should occur.  Please show the CHEMOTHERAPY ALERT CARD or  IMMUNOTHERAPY ALERT CARD at check-in to the Emergency Department and triage nurse.  Should you have questions after your visit or need to cancel or reschedule your appointment, please contact [facility] - A DEPT OF [facility]  Dept: [phone]  and follow the prompts.  Office hours are 8:00 a.m. to 4:30 p.m. Monday - Friday. Please note that voicemails left after 4:00 p.m. may not be returned until the following business day.  We are closed weekends and major holidays. You have access to a nurse at all times for urgent questions. Please call the main number to the clinic Dept: [phone] and follow the prompts.   For any non-urgent questions, you may also contact your provider using MyChart. We now offer e-Visits for anyone 57 and older to request care online for non-urgent symptoms. For details visit [email or web address].   Also download the MyChart app! Go to the app store, search "MyChart", open the app, select [facility], and log in with your MyChart username and password.

## 2023-06-05 NOTE — Progress Notes (Signed)
BRIEF ONCOLOGIC HISTORY:  Oncology History  Malignant neoplasm of upper-outer quadrant of left breast in female, estrogen receptor positive (HCC)  04/25/2022 Imaging   Patient had diagnostic bilateral mammogram given palpable mass in the left breast.  This showed a 2.6 x 3.5 x 3 cm irregular mass as well as possible satellite mass which is indeterminate.  An ultrasound was recommended.  Ultrasound of the left breast mass showed 3.9 x 2.4 x 2.3 cm irregular mass with an irregular and spiculated margin at 3:00 middle depth 5 cm from the nipple, irregular mass is hypoechoic.  In the left breast at 1 o'clock position, 3 cm from the nipple there is a round mass measuring 0.3 x 0.2 x 0.3 cm.  No significant abnormalities were seen sonographically in the left axilla.  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  Left breast needle core biopsy at 1:00 3 cm from the nipple showed fibrocystic changes with usual ductal hyperplasia negative for malignancy.  Prognostic showed ER 95% PR 40%, HER2 3+ KI of 50%   05/08/2022 Cancer Staging   Staging form: Breast, AJCC 8th Edition - Clinical stage from 05/08/2022: Stage IB (cT2, cN0, cM0, G2, ER+, PR+, HER2+) - Signed by Ronny Bacon, PA-C on 05/08/2022 Stage prefix: Initial diagnosis Method of lymph node assessment: Clinical Histologic grading system: 3 grade system   05/18/2022 - 09/16/2022 Chemotherapy   Patient is on Treatment Plan : BREAST  Docetaxel + Carboplatin + Trastuzumab + Pertuzumab  (TCHP) q21d      05/23/2022 Genetic Testing   Negative genetics for [facility] CustomNext-Cancer +RNAinsight Panel.  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 Report date is 05/23/2022.   The CustomNext-Cancer+RNAinsight panel  offered by K[employer] Dupes includes sequencing and rearrangement analysis for the following 47 genes:  APC, ATM, AXIN2, BARD1, BMPR1A, BRCA1, BRCA2, BRIP1, CDH1, CDK4, CDKN2A, CHEK2, DICER1, EPCAM, GREM1, HOXB13, MEN1, MLH1, MSH2, MSH3, MSH6, MUTYH, NBN, NF1, NF2, NTHL1, PALB2, PMS2, POLD1, POLE, PTEN, RAD51C, RAD51D, RECQL, RET, SDHA, SDHAF2, SDHB, SDHC, SDHD, SMAD4, SMARCA4, STK11, TP53, TSC1, TSC2, and VHL.  RNA data is routinely analyzed for use in variant interpretation for all genes.   10/17/2022 -  Chemotherapy   Patient is on Treatment Plan : BREAST Trastuzumab + Pertuzumab q21d x 11 cycles     11/07/2022 Surgery   Left breast lumpectomy: No residual invasive carcinoma or DCIS.  3 SLN negative for cancer, ypT0,pN0   11/07/2022 Cancer Staging   Staging form: Breast, AJCC 8th Edition - Pathologic stage from 11/07/2022: No Stage Recommended (ypT0, pN0, cM0) - Signed by Loa Socks, NP on 11/28/2022 Stage prefix: Post-therapy   12/18/2022 - 01/16/2023 Radiation Therapy   Plan Name: Breast_L_BH Site: Breast, Left Technique: 3D Mode: Photon Dose Per Fraction: 2.66 Gy Prescribed Dose (Delivered / Prescribed): 42.56 Gy / 42.56 Gy Prescribed Fxs (Delivered / Prescribed): 16 / 16   Plan Name: Brst_L_Bst_BH Site: Breast, Left Technique: 3D Mode: Photon Dose Per Fraction: 2 Gy Prescribed Dose (Delivered / Prescribed): 8 Gy / 8 Gy Prescribed Fxs (Delivered / Prescribed): 4 / 4     02/2023 -  Anti-estrogen oral therapy   Tamoxifen daily     INTERVAL HISTORY:   The patient, with a history of breast cancer, presents for  a follow-up visit after completing treatment. She reports adherence to tamoxifen and potassium without any problems. She has not seen any other doctors since the last visit.  The patient also mentions a tender area with a lot of scar tissue near the surgical scar in the left breast which has not changed since radiation treatment. She denies any changes in bowel  movements, urination, chest pain, or shortness of breath. She also reports that her blood cell count is a bit on the lower side, and she has been taking iron pills and daily vitamins as advised.   REVIEW OF SYSTEMS:  Review of Systems  Constitutional:  Negative for appetite change, chills, fatigue, fever and unexpected weight change.  HENT:   Negative for hearing loss, lump/mass and trouble swallowing.   Eyes:  Negative for eye problems and icterus.  Respiratory:  Negative for chest tightness, cough and shortness of breath.   Cardiovascular:  Negative for chest pain, leg swelling and palpitations.  Gastrointestinal:  Negative for abdominal distention, abdominal pain, constipation, diarrhea, nausea and vomiting.  Endocrine: Negative for hot flashes.  Genitourinary:  Negative for difficulty urinating.   Musculoskeletal:  Negative for arthralgias.  Skin:  Negative for itching and rash.  Neurological:  Negative for dizziness, extremity weakness, headaches and numbness.  Hematological:  Negative for adenopathy. Does not bruise/bleed easily.  Psychiatric/Behavioral:  Negative for depression. The patient is not nervous/anxious.    Breast: Denies any new nodularity, masses, tenderness, nipple changes, or nipple discharge.       PAST MEDICAL/SURGICAL HISTORY:  Past Medical History:  Diagnosis Date   Anemia    history of   Breast cancer, left (HCC)    Dyspnea    even with speaking   Family history of adverse reaction to anesthesia    mother slow to awaken   Family history of breast cancer 05/10/2022   Family history of ovarian cancer 05/10/2022   Fibromyalgia    GERD (gastroesophageal reflux disease)    History of kidney stones    last stone 6 months ago    Migraine    Seasonal allergies    Tinnitus    Vertigo    Past Surgical History:  Procedure Laterality Date   ADENOIDECTOMY  1991   BREAST BIOPSY  11/06/2022   MM LT RADIOACTIVE SEED LOC MAMMO GUIDE 11/06/2022 [facility]  MAMMOGRAPHY   BREAST BIOPSY  11/06/2022   MM LT RADIOACTIVE SEED EA ADD LESION LOC MAMMO GUIDE 11/06/2022 [facility] MAMMOGRAPHY   BREAST LUMPECTOMY WITH RADIOACTIVE SEED AND SENTINEL LYMPH NODE BIOPSY Left 11/07/2022   Procedure: LEFT BREAST LUMPECTOMY WITH RADIOACTIVE SEEDX2 AND SENTINEL LYMPH NODE BIOPSY;  Surgeon: Abigail Miyamoto, MD;  Location: [facility];  Service: General;  Laterality: Left;   CYSTOSCOPY WITH RETROGRADE PYELOGRAM, URETEROSCOPY AND STENT PLACEMENT Left 02/22/2021   Procedure: CYSTOSCOPY WITH RETROGRADE PYELOGRAM, URETEROSCOPY AND STENT PLACEMENT;  Surgeon: Belva Agee, MD;  Location: [facility];  Service: Urology;  Laterality: Left;   CYSTOSCOPY/URETEROSCOPY/HOLMIUM LASER/STENT PLACEMENT Left 03/10/2021   Procedure: CYSTOSCOPY RETROGRADE, LEFT URETEROSCOPY/ STONE BASKETRY /HOLMIUM LASER/STENT EXCHANGE;  Surgeon: Crist Fat, MD;  Location: [facility];  Service: Urology;  Laterality: Left;   ESOPHAGOGASTRODUODENOSCOPY N/A 11/17/2013   Procedure: ESOPHAGOGASTRODUODENOSCOPY (EGD);  Surgeon: Rachael Fee, MD;  Location: [facility] ENDOSCOPY;  Service: Endoscopy;  Laterality: N/A;   ESOPHAGOGASTRODUODENOSCOPY N/A 10/15/2015   Procedure: ESOPHAGOGASTRODUODENOSCOPY (EGD);  Surgeon: Iva Boop, MD;  Location: [facility] ENDOSCOPY;  Service: Endoscopy;  Laterality: N/A;   HOLMIUM LASER  APPLICATION  03/10/2021   Procedure: HOLMIUM LASER APPLICATION;  Surgeon: Crist Fat, MD;  Location: [facility];  Service: Urology;;   NASAL SEPTOPLASTY W/ TURBINOPLASTY Bilateral 03/18/2018   Procedure: BILATERAL NASAL SEPTOPLASTY WITH TURBINATE REDUCTION;  Surgeon: Flo Shanks, MD;  Location: [facility];  Service: ENT;  Laterality: Bilateral;   PORTACATH PLACEMENT Right 05/17/2022   Procedure: PORT-A-CATH INSERTION WITH ULTRASOUND GUIDANCE;  Surgeon: Abigail Miyamoto, MD;  Location: [facility];  Service: General;  Laterality: Right;   TONSILLECTOMY  1991    TUBAL LIGATION  2005     ALLERGIES:  Allergies  Allergen Reactions   Amoxicillin Shortness Of Breath and Rash   Coconut (Cocos Nucifera) Anaphylaxis   Penicillins Shortness Of Breath and Rash    Has patient had a PCN reaction causing immediate rash, facial/tongue/throat swelling, SOB or lightheadedness with hypotension: yes Has patient had a PCN reaction causing severe rash involving mucus membranes or skin necrosis: no Has patient had a PCN reaction that required hospitalization no Has patient had a PCN reaction occurring within the last 10 years: no If all of the above answers are "NO", then may proceed with Cephalosporin use.    Duloxetine Other (See Comments)    Caused suicidal thoughts   Hydrocodone Itching   Hydrocodone-Acetaminophen Rash   Moxifloxacin Rash     CURRENT MEDICATIONS:  Outpatient Encounter Medications as of 06/05/2023  Medication Sig   tamoxifen (NOLVADEX) 20 MG tablet Take 1 tablet (20 mg total) by mouth daily.   lidocaine-prilocaine (EMLA) cream Apply 1 Application topically as needed.   potassium chloride (MICRO-K) 10 MEQ CR capsule Take 2 tablets bid x 3 days then take 2 tabs daily.   No facility-administered encounter medications on file as of 06/05/2023.     ONCOLOGIC FAMILY HISTORY:  Family History  Problem Relation Age of Onset   Asthma Mother    Diabetes Mother    Colon polyps Mother    Ovarian cancer Maternal Grandmother 51   Diabetes Maternal Grandmother    Esophageal cancer Maternal Grandfather        d. 62   Breast cancer Other        MGM's sisters x2   Cervical cancer Cousin        mat cousin; dx <40     SOCIAL HISTORY:  Social History   Socioeconomic History   Marital status: Single    Spouse name: Not on file   Number of children: Not on file   Years of education: Not on file   Highest education level: Not on file  Occupational History   Not on file  Tobacco Use   Smoking status: Never   Smokeless tobacco: Never   Vaping Use   Vaping status: Never Used  Substance and Sexual Activity   Alcohol use: No   Drug use: No   Sexual activity: Not on file  Other Topics Concern   Not on file  Social History Narrative   ** Merged History Encounter **       Social Determinants of Health   Financial Resource Strain: Low Risk  (05/10/2022)   Overall Financial Resource Strain (CARDIA)    Difficulty of Paying Living Expenses: Not very hard  Food Insecurity: No Food Insecurity (12/05/2022)   Hunger Vital Sign    Worried About Running Out of Food in the Last Year: Never true    Ran Out of Food in the Last Year: Never true  Transportation Needs: No Transportation  Needs (12/05/2022)   PRAPARE - Administrator, Civil Service (Medical): No    Lack of Transportation (Non-Medical): No  Physical Activity: Not on file  Stress: Not on file  Social Connections: Not on file  Intimate Partner Violence: Not At Risk (12/05/2022)   Humiliation, Afraid, Rape, and Kick questionnaire    Fear of Current or Ex-Partner: No    Emotionally Abused: No    Physically Abused: No    Sexually Abused: No     OBSERVATIONS/OBJECTIVE:  BP 125/70 (BP Location: Right Arm, Patient Position: Sitting)   Pulse 88   Temp 97.6 F (36.4 C) (Temporal)   Resp 12   Wt 173 lb (78.5 kg)   SpO2 100%   BMI 26.30 kg/m  GENERAL: Patient is a well appearing female in no acute distress HEENT:  Sclerae anicteric.  Oropharynx clear and moist. No ulcerations or evidence of oropharyngeal candidiasis. Neck is supple.  NODES:  No cervical, supraclavicular, or axillary lymphadenopathy palpated.  BREAST EXAM:  Deferred. LUNGS:  Clear to auscultation bilaterally.  No wheezes or rhonchi. HEART:  Regular rate and rhythm. No murmur appreciated. ABDOMEN:  Soft, nontender.  Positive, normoactive bowel sounds. No organomegaly palpated. MSK:  No focal spinal tenderness to palpation. Full range of motion bilaterally in the upper  extremities. EXTREMITIES:  No peripheral edema.   SKIN:  Clear with no obvious rashes or skin changes. No nail dyscrasia. NEURO:  Nonfocal. Well oriented.  Appropriate affect.   LABORATORY DATA:  None for this visit.  DIAGNOSTIC IMAGING:  None for this visit.      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  She is status post neoadjuvant chemotherapy, lumpectomy, adjuvant radiation, maintenance Herceptin Perjeta, and tamoxifen that began in August 2024  Breast Cancer Completed treatment with surgery, radiation, and Herceptin. Currently on Tamoxifen. No new symptoms or concerns. Noted tenderness and density in the area of radiation and scar tissue, likely due to treatment changes. -Continue Tamoxifen as prescribed. -Observe area of tenderness and density, contact office if changes occur.  Port Removal Appointment scheduled with surgeon on 06/19/2023 to discuss port removal. -Continue with scheduled appointment.  Mammogram Due for annual mammogram, last performed in October 2023. -Request for mammogram to be sent to [facility]. -Patient to schedule mammogram at [facility].  Low White Blood Cell Count Current count is 2100, likely due to recent completion of Herceptin treatment. Patient has been advised to take iron pills and daily vitamins. -Return in 2-3 months for blood draw to monitor white blood cell count.  Echocardiogram Last performed in September 2024 with normal results. -Order echocardiogram for December 2024.  Follow-up No new concerns or changes in health status. -Return for follow-up visit in 6 months.   *Total Encounter Time as defined by the Centers for Medicare and Medicaid Services includes, in addition to the face-to-face time of a patient visit (documented in  the note above) non-face-to-face time: obtaining and reviewing outside history, ordering and reviewing medications, tests or procedures, care coordination (communications with other health care professionals or caregivers) and documentation in the medical record.

## 2023-06-06 ENCOUNTER — Other Ambulatory Visit: Payer: Self-pay

## 2023-06-19 ENCOUNTER — Other Ambulatory Visit: Payer: Self-pay | Admitting: Surgery

## 2023-06-19 DIAGNOSIS — R921 Mammographic calcification found on diagnostic imaging of breast: Secondary | ICD-10-CM | POA: Diagnosis not present

## 2023-06-19 DIAGNOSIS — C50912 Malignant neoplasm of unspecified site of left female breast: Secondary | ICD-10-CM | POA: Diagnosis not present

## 2023-06-19 DIAGNOSIS — Z853 Personal history of malignant neoplasm of breast: Secondary | ICD-10-CM | POA: Diagnosis not present

## 2023-06-24 NOTE — Progress Notes (Signed)
COVID Vaccine received:  [x]  No []  Yes Date of any COVID positive Test in last 90 days: none  PCP - Adelene Amas, DO Cardiologist - none Oncology- Rachel Moulds, MD   Chest x-ray - 11-20-2022  1v  Epic EKG -  11-27-2022 Stress Test -  ECHO - 06-05-2023  Epic Cardiac Cath -   PCR screen: []  Ordered & Completed []   No Order but Needs PROFEND     [x]   N/A for this surgery  Surgery Plan:  [x]  Ambulatory   []  Outpatient in bed  []  Admit Anesthesia:    []  General  []  Spinal  []   Choice [x]   MAC  Pacemaker / ICD device [x]  No []  Yes   Spinal Cord Stimulator:[x]  No []  Yes       History of Sleep Apnea? [x]  No []  Yes   CPAP used?- []  No [x]  Yes    Does the patient monitor blood sugar?   [x]  N/A   []  No []  Yes  Patient has: [x]  NO Hx DM   []  Pre-DM   []  DM1  []   DM2  Blood Thinner / Instructions: none Aspirin Instructions: none  ERAS Protocol Ordered: []  No  [x]  Yes PRE-SURGERY []  ENSURE  []  G2   [x]  No Drink Ordered Patient is to be NPO after: 0430  Dental hx: []  Dentures:  []  N/A      []  Bridge or Partial:                   []  Loose or Damaged teeth: damaged teeth on right side of mouth. Upper and lower  Activity level: Patient is able / unable to climb a flight of stairs without difficulty; [x]  No CP  [x]  No SOB,  Patient can  perform ADLs without assistance.   Anesthesia review: s/p left breast lumpectomy/radiation, GERD, anemia, current po chemo- finished with infusions,    Patient denies shortness of breath, fever, cough and chest pain at PAT appointment.  Patient verbalized understanding and agreement to the Pre-Surgical Instructions that were given to them at this PAT appointment. Patient was also educated of the need to review these PAT instructions again prior to her surgery.I reviewed the appropriate phone numbers to call if they have any and questions or concerns.

## 2023-06-24 NOTE — Patient Instructions (Signed)
SURGICAL WAITING ROOM VISITATION Patients having surgery or a procedure may have no more than 2 support people in the waiting area - these visitors may rotate in the visitor waiting room.   Due to an increase in RSV and influenza rates and associated hospitalizations, children ages 68 and under may not visit patients in [facility]. If the patient needs to stay at the hospital during part of their recovery, the visitor guidelines for inpatient rooms apply.  PRE-OP VISITATION  Pre-op nurse will coordinate an appropriate time for 1 support person to accompany the patient in pre-op.  This support person may not rotate.  This visitor will be contacted when the time is appropriate for the visitor to come back in the pre-op area.  Please refer to the [facility] website for the visitor guidelines for Inpatients (after your surgery is over and you are in a regular room).  You are not required to quarantine at this time prior to your surgery. However, you must do this: Hand Hygiene often Do NOT share personal items Notify your provider if you are in close contact with someone who has COVID or you develop fever 100.4 or greater, new onset of sneezing, cough, sore throat, shortness of breath or body aches.  If you test positive for Covid or have been in contact with anyone that has tested positive in the last 10 days please notify you surgeon.    Your procedure is scheduled on:  THursday  July 05, 2023  Report to [facility] Main Entrance: [address] entrance where the [employer] is available.   Report to admitting at: 05:15    AM  Call this number if you have any questions or problems the morning of surgery [phone]  Do not eat food after Midnight the night prior to your surgery/procedure.  After Midnight you may have the following liquids until 04:30 AM  DAY OF SURGERY  Clear Liquid Diet Water Black Coffee (sugar ok, NO MILK/CREAM OR CREAMERS)  Tea (sugar ok, NO  MILK/CREAM OR CREAMERS) regular and decaf                             Plain Jell-O  with no fruit (NO RED)                                           Fruit ices (not with fruit pulp, NO RED)                                     Popsicles (NO RED)                                                                  Juice: NO CITRUS JUICES: only apple, WHITE grape, WHITE cranberry Sports drinks like Gatorade or Powerade (NO RED)                FOLLOW ANY ADDITIONAL PRE OP INSTRUCTIONS YOU RECEIVED FROM YOUR SURGEON'S OFFICE!!!   Oral Hygiene is also important to reduce your risk  of infection.        Remember - BRUSH YOUR TEETH THE MORNING OF SURGERY WITH YOUR REGULAR TOOTHPASTE  Do NOT smoke after Midnight the night before surgery.  STOP TAKING all Vitamins, Herbs and supplements 1 week before your surgery.   Take ONLY these medicines the morning of surgery with A SIP OF WATER: tamoxifen.                    You may not have any metal on your body including hair pins, jewelry, and body piercing  Do not wear make-up, lotions, powders, perfumes  or deodorant  Do not wear nail polish including gel and S&S, artificial / acrylic nails, or any other type of covering on natural nails including finger and toenails. If you have artificial nails, gel coating, etc., that needs to be removed by a nail salon, Please have this removed prior to surgery. Not doing so may mean that your surgery could be cancelled or delayed if the Surgeon or anesthesia staff feels like they are unable to monitor you safely.   Do not shave 48 hours prior to surgery to avoid nicks in your skin which may contribute to postoperative infections.    Contacts, Hearing Aids, dentures or bridgework may not be worn into surgery. DENTURES WILL BE REMOVED PRIOR TO SURGERY PLEASE DO NOT APPLY "Poly grip" OR ADHESIVES!!!  Patients discharged on the day of surgery will not be allowed to drive home.  Someone NEEDS to stay with you for the  first 24 hours after anesthesia.  Do not bring your home medications to the hospital. The Pharmacy will dispense medications listed on your medication list to you during your admission in the Hospital.  Please read over the following fact sheets you were given: IF YOU HAVE QUESTIONS ABOUT YOUR PRE-OP INSTRUCTIONS, PLEASE CALL [phone]   [facility] - Preparing for Surgery Before surgery, you can play an important role.  Because skin is not sterile, your skin needs to be as free of germs as possible.  You can reduce the number of germs on your skin by washing with CHG (chlorahexidine gluconate) soap before surgery.  CHG is an antiseptic cleaner which kills germs and bonds with the skin to continue killing germs even after washing. Please DO NOT use if you have an allergy to CHG or antibacterial soaps.  If your skin becomes reddened/irritated stop using the CHG and inform your nurse when you arrive at Short Stay. Do not shave (including legs and underarms) for at least 48 hours prior to the first CHG shower.  You may shave your face/neck.  Please follow these instructions carefully:  1.  Shower with CHG Soap the night before surgery and the  morning of surgery.  2.  If you choose to wash your hair, wash your hair first as usual with your normal  shampoo.  3.  After you shampoo, rinse your hair and body thoroughly to remove the shampoo.                             4.  Use CHG as you would any other liquid soap.  You can apply chg directly to the skin and wash.  Gently with a scrungie or clean washcloth.  5.  Apply the CHG Soap to your body ONLY FROM THE NECK DOWN.   Do not use on face/ open  Wound or open sores. Avoid contact with eyes, ears mouth and genitals (private parts).                       Wash face,  Genitals (private parts) with your normal soap.             6.  Wash thoroughly, paying special attention to the area where your  surgery  will be performed.  7.   Thoroughly rinse your body with warm water from the neck down.  8.  DO NOT shower/wash with your normal soap after using and rinsing off the CHG Soap.            9.  Pat yourself dry with a clean towel.            10.  Wear clean pajamas.            11.  Place clean sheets on your bed the night of your first shower and do not  sleep with pets.  ON THE DAY OF SURGERY : Do not apply any lotions/deodorants the morning of surgery.  Please wear clean clothes to the hospital/surgery center.     FAILURE TO FOLLOW THESE INSTRUCTIONS MAY RESULT IN THE CANCELLATION OF YOUR SURGERY  PATIENT SIGNATURE_________________________________  NURSE SIGNATURE__________________________________  ________________________________________________________________________

## 2023-06-26 ENCOUNTER — Encounter: Payer: Self-pay | Admitting: Hematology and Oncology

## 2023-06-26 ENCOUNTER — Encounter (HOSPITAL_COMMUNITY)
Admission: RE | Admit: 2023-06-26 | Discharge: 2023-06-26 | Disposition: A | Discharge status: Home / Self Care | Payer: 59 | Source: Ambulatory Visit | Attending: Orthopaedic Surgery | Admitting: Orthopaedic Surgery

## 2023-06-26 ENCOUNTER — Other Ambulatory Visit: Payer: Self-pay

## 2023-06-26 ENCOUNTER — Encounter (HOSPITAL_COMMUNITY): Payer: Self-pay

## 2023-06-26 VITALS — BP 112/67 | HR 72 | Temp 98.8°F | Resp 14 | Ht 68.0 in | Wt 171.0 lb

## 2023-06-26 DIAGNOSIS — Z01818 Encounter for other preprocedural examination: Secondary | ICD-10-CM | POA: Diagnosis not present

## 2023-06-26 DIAGNOSIS — Z01812 Encounter for preprocedural laboratory examination: Secondary | ICD-10-CM | POA: Diagnosis not present

## 2023-06-26 DIAGNOSIS — Z79810 Long term (current) use of selective estrogen receptor modulators (SERMs): Secondary | ICD-10-CM | POA: Diagnosis not present

## 2023-06-26 DIAGNOSIS — W44F3XA Food entering into or through a natural orifice, initial encounter: Secondary | ICD-10-CM

## 2023-06-26 LAB — CBC
HCT: 29.8 % — ABNORMAL LOW (ref 36.0–46.0)
Hemoglobin: 9.7 g/dL — ABNORMAL LOW (ref 12.0–15.0)
MCH: 29 pg (ref 26.0–34.0)
MCHC: 32.6 g/dL (ref 30.0–36.0)
MCV: 89 fL (ref 80.0–100.0)
Platelets: 145 10*3/uL — ABNORMAL LOW (ref 150–400)
RBC: 3.35 MIL/uL — ABNORMAL LOW (ref 3.87–5.11)
RDW: 14.3 % (ref 11.5–15.5)
WBC: 2.9 10*3/uL — ABNORMAL LOW (ref 4.0–10.5)
nRBC: 0 % (ref 0.0–0.2)

## 2023-06-26 LAB — COMPREHENSIVE METABOLIC PANEL
ALT: 8 U/L (ref 0–44)
AST: 11 U/L — ABNORMAL LOW (ref 15–41)
Albumin: 3.4 g/dL — ABNORMAL LOW (ref 3.5–5.0)
Alkaline Phosphatase: 53 U/L (ref 38–126)
Anion gap: 7 (ref 5–15)
BUN: 13 mg/dL (ref 6–20)
CO2: 26 mmol/L (ref 22–32)
Calcium: 8.4 mg/dL — ABNORMAL LOW (ref 8.9–10.3)
Chloride: 102 mmol/L (ref 98–111)
Creatinine, Ser: 0.96 mg/dL (ref 0.44–1.00)
GFR, Estimated: >60 mL/min (ref >60)
Glucose, Bld: 104 mg/dL — ABNORMAL HIGH (ref 70–99)
Potassium: 3.3 mmol/L — ABNORMAL LOW (ref 3.5–5.1)
Sodium: 135 mmol/L (ref 135–145)
Total Bilirubin: 0.4 mg/dL (ref <1.2)
Total Protein: 6.8 g/dL (ref 6.5–8.1)

## 2023-06-27 ENCOUNTER — Other Ambulatory Visit: Payer: Self-pay

## 2023-06-27 DIAGNOSIS — N6311 Unspecified lump in the right breast, upper outer quadrant: Secondary | ICD-10-CM | POA: Diagnosis not present

## 2023-06-27 DIAGNOSIS — N6489 Other specified disorders of breast: Secondary | ICD-10-CM | POA: Diagnosis not present

## 2023-06-28 LAB — SURGICAL PATHOLOGY

## 2023-07-02 ENCOUNTER — Ambulatory Visit (HOSPITAL_COMMUNITY)
Admission: RE | Admit: 2023-07-02 | Discharge: 2023-07-02 | Disposition: A | Discharge status: Home / Self Care | Payer: 59 | Source: Ambulatory Visit | Attending: Hematology and Oncology | Admitting: Hematology and Oncology

## 2023-07-02 DIAGNOSIS — Z0189 Encounter for other specified special examinations: Secondary | ICD-10-CM | POA: Diagnosis not present

## 2023-07-02 DIAGNOSIS — C50412 Malignant neoplasm of upper-outer quadrant of left female breast: Secondary | ICD-10-CM | POA: Diagnosis not present

## 2023-07-02 DIAGNOSIS — Z170 Estrogen receptor positive status [ER+]: Secondary | ICD-10-CM | POA: Insufficient documentation

## 2023-07-02 DIAGNOSIS — Z09 Encounter for follow-up examination after completed treatment for conditions other than malignant neoplasm: Secondary | ICD-10-CM | POA: Insufficient documentation

## 2023-07-02 LAB — ECHOCARDIOGRAM COMPLETE
Area-P 1/2: 4.8 cm2
Calc EF: 61.4 %
S' Lateral: 3.2 cm
Single Plane A2C EF: 61.5 %
Single Plane A4C EF: 64.4 %

## 2023-07-05 ENCOUNTER — Encounter: Payer: Self-pay | Admitting: Hematology and Oncology

## 2023-07-05 ENCOUNTER — Ambulatory Visit (HOSPITAL_BASED_OUTPATIENT_CLINIC_OR_DEPARTMENT_OTHER): Payer: 59 | Admitting: Certified Registered"

## 2023-07-05 ENCOUNTER — Ambulatory Visit (HOSPITAL_COMMUNITY)
Admission: RE | Admit: 2023-07-05 | Discharge: 2023-07-05 | Disposition: A | Discharge status: Home / Self Care | Payer: 59 | Attending: Surgery | Admitting: Surgery

## 2023-07-05 ENCOUNTER — Other Ambulatory Visit: Payer: Self-pay

## 2023-07-05 ENCOUNTER — Ambulatory Visit (HOSPITAL_COMMUNITY): Payer: 59 | Admitting: Physician Assistant

## 2023-07-05 ENCOUNTER — Encounter (HOSPITAL_COMMUNITY): Payer: Self-pay | Admitting: Surgery

## 2023-07-05 ENCOUNTER — Encounter (HOSPITAL_COMMUNITY)
Admission: RE | Disposition: A | Discharge status: Home / Self Care | Payer: Self-pay | Source: Home / Self Care | Attending: Surgery

## 2023-07-05 ENCOUNTER — Other Ambulatory Visit (HOSPITAL_COMMUNITY): Payer: Self-pay

## 2023-07-05 DIAGNOSIS — Z9221 Personal history of antineoplastic chemotherapy: Secondary | ICD-10-CM | POA: Insufficient documentation

## 2023-07-05 DIAGNOSIS — Z452 Encounter for adjustment and management of vascular access device: Secondary | ICD-10-CM | POA: Insufficient documentation

## 2023-07-05 DIAGNOSIS — Z7722 Contact with and (suspected) exposure to environmental tobacco smoke (acute) (chronic): Secondary | ICD-10-CM | POA: Diagnosis not present

## 2023-07-05 DIAGNOSIS — Z01818 Encounter for other preprocedural examination: Secondary | ICD-10-CM

## 2023-07-05 DIAGNOSIS — Z79810 Long term (current) use of selective estrogen receptor modulators (SERMs): Secondary | ICD-10-CM

## 2023-07-05 DIAGNOSIS — C50412 Malignant neoplasm of upper-outer quadrant of left female breast: Secondary | ICD-10-CM | POA: Diagnosis not present

## 2023-07-05 DIAGNOSIS — C50912 Malignant neoplasm of unspecified site of left female breast: Secondary | ICD-10-CM | POA: Insufficient documentation

## 2023-07-05 HISTORY — PX: PORT-A-CATH REMOVAL: SHX5289

## 2023-07-05 LAB — POCT PREGNANCY, URINE: Preg Test, Ur: NEGATIVE

## 2023-07-05 SURGERY — REMOVAL PORT-A-CATH
Anesthesia: Monitor Anesthesia Care | Laterality: Right

## 2023-07-05 MED ORDER — CHLORHEXIDINE GLUCONATE 0.12 % MT SOLN
15.0000 mL | Freq: Once | OROMUCOSAL | Status: AC
  Administered 2023-07-05: 15 mL via OROMUCOSAL

## 2023-07-05 MED ORDER — BUPIVACAINE HCL (PF) 0.5 % IJ SOLN
INTRAMUSCULAR | Status: AC
Start: 2023-07-05 — End: ?
  Filled 2023-07-05: qty 30

## 2023-07-05 MED ORDER — FENTANYL CITRATE (PF) 100 MCG/2ML IJ SOLN
INTRAMUSCULAR | Status: DC | PRN
  Administered 2023-07-05 (×2): 50 ug via INTRAVENOUS

## 2023-07-05 MED ORDER — DROPERIDOL 2.5 MG/ML IJ SOLN: 0.6250 mg | Freq: Once | INTRAMUSCULAR | Status: DC | PRN

## 2023-07-05 MED ORDER — LIDOCAINE HCL 1 % IJ SOLN
INTRAMUSCULAR | Status: DC | PRN
  Administered 2023-07-05: 6 mL

## 2023-07-05 MED ORDER — ACETAMINOPHEN 500 MG PO TABS
1000.0000 mg | ORAL_TABLET | ORAL | Status: AC
  Administered 2023-07-05: 1000 mg via ORAL
  Filled 2023-07-05: qty 2

## 2023-07-05 MED ORDER — LACTATED RINGERS IV SOLN: INTRAVENOUS | Status: DC | PRN

## 2023-07-05 MED ORDER — MIDAZOLAM HCL 2 MG/2ML IJ SOLN
INTRAMUSCULAR | Status: AC
  Filled 2023-07-05: qty 2

## 2023-07-05 MED ORDER — CHLORHEXIDINE GLUCONATE CLOTH 2 % EX PADS: 6.0000 | MEDICATED_PAD | Freq: Once | CUTANEOUS | Status: DC

## 2023-07-05 MED ORDER — FENTANYL CITRATE (PF) 100 MCG/2ML IJ SOLN
INTRAMUSCULAR | Status: AC
  Filled 2023-07-05: qty 2

## 2023-07-05 MED ORDER — PROPOFOL 500 MG/50ML IV EMUL
INTRAVENOUS | Status: DC | PRN
  Administered 2023-07-05: 125 ug/kg/min via INTRAVENOUS

## 2023-07-05 MED ORDER — TRAMADOL HCL 50 MG PO TABS
50.0000 mg | ORAL_TABLET | Freq: Four times a day (QID) | ORAL | 0 refills | Status: DC | PRN
Start: 2023-07-05 — End: 2023-10-29
  Filled 2023-07-05: qty 5, 2d supply, fill #0

## 2023-07-05 MED ORDER — LACTATED RINGERS IV SOLN: INTRAVENOUS | Status: DC

## 2023-07-05 MED ORDER — ORAL CARE MOUTH RINSE: 15.0000 mL | Freq: Once | OROMUCOSAL | Status: AC

## 2023-07-05 MED ORDER — MIDAZOLAM HCL 5 MG/5ML IJ SOLN
INTRAMUSCULAR | Status: DC | PRN
  Administered 2023-07-05: 2 mg via INTRAVENOUS

## 2023-07-05 MED ORDER — 0.9 % SODIUM CHLORIDE (POUR BTL) OPTIME
TOPICAL | Status: DC | PRN
  Administered 2023-07-05: 1000 mL

## 2023-07-05 MED ORDER — FENTANYL CITRATE PF 50 MCG/ML IJ SOSY: 25.0000 ug | PREFILLED_SYRINGE | INTRAMUSCULAR | Status: DC | PRN

## 2023-07-05 MED ORDER — ONDANSETRON HCL 4 MG/2ML IJ SOLN
INTRAMUSCULAR | Status: DC | PRN
  Administered 2023-07-05: 4 mg via INTRAVENOUS

## 2023-07-05 MED ORDER — LIDOCAINE HCL (PF) 1 % IJ SOLN
INTRAMUSCULAR | Status: AC
  Filled 2023-07-05: qty 30

## 2023-07-05 SURGICAL SUPPLY — 25 items
BAG COUNTER SPONGE SURGICOUNT (BAG) IMPLANT
BENZOIN TINCTURE PRP APPL 2/3 (GAUZE/BANDAGES/DRESSINGS) IMPLANT
BLADE SURG 15 STRL LF DISP TIS (BLADE) ×1 IMPLANT
BNDG ADH 1X3 SHEER STRL LF (GAUZE/BANDAGES/DRESSINGS) IMPLANT
CHLORAPREP W/TINT 26 (MISCELLANEOUS) ×1 IMPLANT
COVER SURGICAL LIGHT HANDLE (MISCELLANEOUS) ×1 IMPLANT
DERMABOND ADVANCED .7 DNX12 (GAUZE/BANDAGES/DRESSINGS) ×1 IMPLANT
DRAPE LAPAROTOMY TRNSV 102X78 (DRAPES) ×1 IMPLANT
ELECT REM PT RETURN 15FT ADLT (MISCELLANEOUS) ×1 IMPLANT
GLOVE BIO SURGEON STRL SZ7.5 (GLOVE) ×1 IMPLANT
GOWN STRL REUS W/ TWL XL LVL3 (GOWN DISPOSABLE) ×1 IMPLANT
KIT BASIN OR (CUSTOM PROCEDURE TRAY) ×1 IMPLANT
KIT TURNOVER KIT A (KITS) IMPLANT
NDL HYPO 25X1 1.5 SAFETY (NEEDLE) ×1 IMPLANT
NEEDLE HYPO 25X1 1.5 SAFETY (NEEDLE) ×1
PACK BASIC VI WITH GOWN DISP (CUSTOM PROCEDURE TRAY) ×1 IMPLANT
PENCIL SMOKE EVACUATOR (MISCELLANEOUS) ×1 IMPLANT
SPIKE FLUID TRANSFER (MISCELLANEOUS) ×1 IMPLANT
SPONGE T-LAP 4X18 ~~LOC~~+RFID (SPONGE) ×1 IMPLANT
STRIP CLOSURE SKIN 1/2X4 (GAUZE/BANDAGES/DRESSINGS) IMPLANT
SUT MNCRL AB 4-0 PS2 18 (SUTURE) ×1 IMPLANT
SUT VIC AB 3-0 SH 27XBRD (SUTURE) ×1 IMPLANT
SYR 20ML LL LF (SYRINGE) IMPLANT
SYR CONTROL 10ML LL (SYRINGE) ×1 IMPLANT
TOWEL OR 17X26 10 PK STRL BLUE (TOWEL DISPOSABLE) ×1 IMPLANT

## 2023-07-05 NOTE — H&P (Signed)
[name] [name] is an 47 y.o. female.   Chief Complaint: Port-A-Cath no longer needed HPI: This is a 47 year old female was completed her treatment for breast cancer.  Her Port-A-Cath is no longer needed so removal was requested.  She has no complaints and has been doing very well  Past Medical History:  Diagnosis Date   Anemia    history of   Breast cancer, left (HCC)    Dyspnea    even with speaking   Family history of adverse reaction to anesthesia    mother slow to awaken   Family history of breast cancer 05/10/2022   Family history of ovarian cancer 05/10/2022   Fibromyalgia    GERD (gastroesophageal reflux disease)    History of kidney stones    last stone 6 months ago    Migraine    Seasonal allergies    Tinnitus    Vertigo     Past Surgical History:  Procedure Laterality Date   ADENOIDECTOMY  1991   BREAST BIOPSY  11/06/2022   MM LT RADIOACTIVE SEED LOC MAMMO GUIDE 11/06/2022 [facility]   BREAST BIOPSY  11/06/2022   MM LT RADIOACTIVE SEED EA ADD LESION LOC MAMMO GUIDE 11/06/2022 [facility]   BREAST LUMPECTOMY WITH RADIOACTIVE SEED AND SENTINEL LYMPH NODE BIOPSY Left 11/07/2022   Procedure: LEFT BREAST LUMPECTOMY WITH RADIOACTIVE SEEDX2 AND SENTINEL LYMPH NODE BIOPSY;  Surgeon: Abigail Miyamoto, MD;  Location: [facility];  Service: General;  Laterality: Left;   CYSTOSCOPY WITH RETROGRADE PYELOGRAM, URETEROSCOPY AND STENT PLACEMENT Left 02/22/2021   Procedure: CYSTOSCOPY WITH RETROGRADE PYELOGRAM, URETEROSCOPY AND STENT PLACEMENT;  Surgeon: Belva Agee, MD;  Location: [facility];  Service: Urology;  Laterality: Left;   CYSTOSCOPY/URETEROSCOPY/HOLMIUM LASER/STENT PLACEMENT Left 03/10/2021   Procedure: CYSTOSCOPY RETROGRADE, LEFT URETEROSCOPY/ STONE BASKETRY /HOLMIUM LASER/STENT EXCHANGE;  Surgeon: Crist Fat, MD;  Location: [facility];  Service: Urology;  Laterality: Left;   ESOPHAGOGASTRODUODENOSCOPY N/A 11/17/2013   Procedure:  ESOPHAGOGASTRODUODENOSCOPY (EGD);  Surgeon: Rachael Fee, MD;  Location: [facility] END[facility]COPY;  Service: Endoscopy;  Laterality: N/A;   ESOPHAGOGASTRODUODENOSCOPY N/A 10/15/2015   Procedure: ESOPHAGOGASTRODUODENOSCOPY (EGD);  Surgeon: Iva Boop, MD;  Location: [facility] ENDOSCOPY;  Service: Endoscopy;  Laterality: N/A;   HOLMIUM LASER APPLICATION  03/10/2021   Procedure: HOLMIUM LASER APPLICATION;  Surgeon: Crist Fat, MD;  Location: [facility];  Service: Urology;;   NASAL SEPTOPLASTY W/ TURBINOPLASTY Bilateral 03/18/2018   Procedure: BILATERAL NASAL SEPTOPLASTY WITH TURBINATE REDUCTION;  Surgeon: Flo Shanks, MD;  Location: [facility];  Service: ENT;  Laterality: Bilateral;   PORTACATH PLACEMENT Right 05/17/2022   Procedure: PORT-A-CATH INSERTION WITH ULTRASOUND GUIDANCE;  Surgeon: Abigail Miyamoto, MD;  Location: [facility];  Service: General;  Laterality: Right;   TONSILLECTOMY  1991   TUBAL LIGATION  2005    Family History  Problem Relation Age of Onset   Asthma Mother    Diabetes Mother    Colon polyps Mother    Ovarian cancer Maternal Grandmother 8   Diabetes Maternal Grandmother    Esophageal cancer Maternal Grandfather        d. 48   Breast cancer Other        MGM's sisters x2   Cervical cancer Cousin        mat cousin; dx <40   Social History:  reports that she has never smoked. She has been exposed to tobacco smoke. She has never used smokeless tobacco. She reports that she does  not drink alcohol and does not use drugs.  Allergies:  Allergies  Allergen Reactions   Amoxicillin Shortness Of Breath and Rash   Coconut (Cocos Nucifera) Anaphylaxis   Penicillins Shortness Of Breath and Rash    Has patient had a PCN reaction causing immediate rash, facial/tongue/throat swelling, SOB or lightheadedness with hypotension: yes Has patient had a PCN reaction causing severe rash involving mucus membranes or skin necrosis: no Has patient had a PCN  reaction that required hospitalization no Has patient had a PCN reaction occurring within the last 10 years: no If all of the above answers are "NO", then may proceed with Cephalosporin use.    Duloxetine Other (See Comments)    Caused suicidal thoughts   Hydrocodone Itching   Hydrocodone-Acetaminophen Rash   Moxifloxacin Rash    Medications Prior to Admission  Medication Sig Dispense Refill   potassium chloride (MICRO-K) 10 MEQ CR capsule Take 2 tablets bid x 3 days then take 2 tabs daily. (Patient taking differently: Take 20 mEq by mouth in the morning.) 66 capsule 3   tamoxifen (NOLVADEX) 20 MG tablet Take 1 tablet (20 mg total) by mouth daily. 30 tablet 3    Results for orders placed or performed during the hospital encounter of 07/05/23 (from the past 48 hours)  Pregnancy, urine POC     Status: None   Collection Time: 07/05/23  6:02 AM  Result Value Ref Range   Preg Test, Ur NEGATIVE NEGATIVE    Comment:        THE SENSITIVITY OF THIS METHODOLOGY IS >24 mIU/mL    No results found.  Review of Systems  All other systems reviewed and are negative.   Blood pressure 112/72, pulse 71, temperature 98.3 F (36.8 C), temperature source Oral, resp. rate 18, height 5\' 8"  (1.727 m), weight 77.6 kg, last menstrual period 06/25/2023, SpO2 96%. Physical Exam Constitutional:      General: She is not in acute distress.    Appearance: Normal appearance.  Eyes:     General: No scleral icterus.    Pupils: Pupils are equal, round, and reactive to light.  Cardiovascular:     Rate and Rhythm: Normal rate and regular rhythm.  Pulmonary:     Effort: Pulmonary effort is normal. No respiratory distress.  Musculoskeletal:     Cervical back: Normal range of motion.  Skin:    General: Skin is warm and dry.     Findings: No erythema.  Neurological:     General: No focal deficit present.     Mental Status: She is alert.  Psychiatric:        Mood and Affect: Mood normal.       Assessment/Plan Port-A-Cath no longer needed after completion of chemotherapy  Plan will be to proceed to the operating room for Port-A-Cath removal.  We again discussed the procedure in detail.  We discussed the risks which includes but is not limited to bleeding and infection.  She agrees with the plans.  Abigail Miyamoto, MD 07/05/2023, 6:37 AM

## 2023-07-05 NOTE — Anesthesia Preprocedure Evaluation (Addendum)
Anesthesia Evaluation  Patient identified by MRN, date of birth, ID band Patient awake    Reviewed: Allergy & Precautions, NPO status , Patient's Chart, lab work & pertinent test results  Airway Mallampati: II  TM Distance: >3 FB Neck ROM: Full    Dental no notable dental hx.    Pulmonary neg pulmonary ROS   Pulmonary exam normal        Cardiovascular negative cardio ROS  Rhythm:Regular Rate:Normal     Neuro/Psych  Headaches  negative psych ROS   GI/Hepatic Neg liver ROS,GERD  ,,  Endo/Other  negative endocrine ROS    Renal/GU negative Renal ROS  negative genitourinary   Musculoskeletal  (+)  Fibromyalgia -Breast Ca here for port removal    Abdominal Normal abdominal exam  (+)   Peds  Hematology  (+) Blood dyscrasia, anemia   Anesthesia Other Findings   Reproductive/Obstetrics                             Anesthesia Physical Anesthesia Plan  ASA: 2  Anesthesia Plan: MAC   Post-op Pain Management:    Induction: Intravenous  PONV Risk Score and Plan: 2 and Ondansetron, Dexamethasone, Propofol infusion, Midazolam and Treatment may vary due to age or medical condition  Airway Management Planned: Simple Face Mask and Nasal Cannula  Additional Equipment: None  Intra-op Plan:   Post-operative Plan:   Informed Consent: I have reviewed the patients History and Physical, chart, labs and discussed the procedure including the risks, benefits and alternatives for the proposed anesthesia with the patient or authorized representative who has indicated his/her understanding and acceptance.     Dental advisory given  Plan Discussed with: CRNA  Anesthesia Plan Comments:        Anesthesia Quick Evaluation

## 2023-07-05 NOTE — Anesthesia Postprocedure Evaluation (Signed)
Anesthesia Post Note  Patient: [name]  Procedure(s) Performed: REMOVAL PORT-A-CATH (Right)     Patient location during evaluation: PACU Anesthesia Type: MAC Level of consciousness: awake and alert Pain management: pain level controlled Vital Signs Assessment: post-procedure vital signs reviewed and stable Respiratory status: spontaneous breathing, nonlabored ventilation, respiratory function stable and patient connected to nasal cannula oxygen Cardiovascular status: stable and blood pressure returned to baseline Postop Assessment: no apparent nausea or vomiting Anesthetic complications: no   No notable events documented.  Last Vitals:  Vitals:   07/05/23 0838 07/05/23 0840  BP:  (!) 105/57  Pulse: (!) 59 60  Resp:  20  Temp:    SpO2: 96% 96%    Last Pain:  Vitals:   07/05/23 0840  TempSrc:   PainSc: 0-No pain                 Earl Lites P Armonie Staten

## 2023-07-05 NOTE — Transfer of Care (Signed)
Immediate Anesthesia Transfer of Care Note  Patient: Cleon Dew Fair  Procedure(s) Performed: REMOVAL PORT-A-CATH (Right)  Patient Location: PACU  Anesthesia Type:MAC  Level of Consciousness: awake and alert   Airway & Oxygen Therapy: Patient Spontanous Breathing and Patient connected to nasal cannula oxygen  Post-op Assessment: Report given to RN and Post -op Vital signs reviewed and stable  Post vital signs: Reviewed and stable  Last Vitals:  Vitals Value Taken Time  BP 106/63 07/05/23 0807  Temp 36.6 C 07/05/23 0807  Pulse 58 07/05/23 0813  Resp 20 07/05/23 0807  SpO2 94 % 07/05/23 0813  Vitals shown include unfiled device data.  Last Pain:  Vitals:   07/05/23 0807  TempSrc:   PainSc: 0-No pain      Patients Stated Pain Goal: 5 (07/05/23 0550)  Complications: No notable events documented.

## 2023-07-05 NOTE — Op Note (Signed)
REMOVAL PORT-A-CATH  Procedure Note  [name] [name] 07/05/2023   Pre-op Diagnosis: Port-A-Cath no longer needed     Post-op Diagnosis: Same  Procedure(s): REMOVAL PORT-A-CATH  Surgeon(s): Abigail Miyamoto, MD  Anesthesia: Monitor Anesthesia Care  Staff:  Circulator: Lorie Apley, RN Scrub Person: Angelena Form  Estimated Blood Loss: Minimal               Indications: This is a 47 year old female was completed chemotherapy for breast cancer.  Port-A-Cath removal is now requested  Procedure: The patient was brought to the operating room and identified as the correct patient.  She was placed upon the operating table and anesthesia was induced.  Her right chest at the Port-A-Cath site was then prepped and draped in usual sterile fashion.  I anesthetized the skin at the previous scar with 1% lidocaine.  I then made an elliptical incision to excise the scar at the Port-A-Cath site with a scalpel.  I then excised the scar with the cautery.  I then dissected down to the port.  I removed the Prolene suture and there was easily able to remove the port and the entire length of catheter from the Port-A-Cath site.  I closed the catheter tract with a figure-of-eight 3-0 Vicryl suture.  I then closed the subcutaneous tissue with interrupted 3-0 Vicryl sutures and closed the skin with a running 4-0 Monocryl.  Dermabond was then applied.  The patient tolerated the procedure well.  All the counts were correct at the end of the procedure.  The patient was then taken in a stable condition from the operating room to the recovery room.          Abigail Miyamoto   Date: 07/05/2023  Time: 8:00 AM

## 2023-07-05 NOTE — Discharge Instructions (Signed)
You may shower starting tomorrow  Ice pack, tylenol, and ibuprofen also for pain  No vigorous activity for one week

## 2023-07-06 ENCOUNTER — Encounter (HOSPITAL_COMMUNITY): Payer: Self-pay | Admitting: Surgery

## 2023-07-27 DIAGNOSIS — H6991 Unspecified Eustachian tube disorder, right ear: Secondary | ICD-10-CM | POA: Diagnosis not present

## 2023-07-27 DIAGNOSIS — H90A11 Conductive hearing loss, unilateral, right ear with restricted hearing on the contralateral side: Secondary | ICD-10-CM | POA: Diagnosis not present

## 2023-07-27 DIAGNOSIS — Z853 Personal history of malignant neoplasm of breast: Secondary | ICD-10-CM | POA: Diagnosis not present

## 2023-07-27 DIAGNOSIS — H90A22 Sensorineural hearing loss, unilateral, left ear, with restricted hearing on the contralateral side: Secondary | ICD-10-CM | POA: Diagnosis not present

## 2023-07-27 DIAGNOSIS — H90A31 Mixed conductive and sensorineural hearing loss, unilateral, right ear with restricted hearing on the contralateral side: Secondary | ICD-10-CM | POA: Diagnosis not present

## 2023-07-27 DIAGNOSIS — H9313 Tinnitus, bilateral: Secondary | ICD-10-CM | POA: Diagnosis not present

## 2023-09-03 ENCOUNTER — Inpatient Hospital Stay: Payer: 59 | Attending: Hematology and Oncology

## 2023-09-03 DIAGNOSIS — Z1721 Progesterone receptor positive status: Secondary | ICD-10-CM | POA: Diagnosis not present

## 2023-09-03 DIAGNOSIS — C50412 Malignant neoplasm of upper-outer quadrant of left female breast: Secondary | ICD-10-CM | POA: Diagnosis not present

## 2023-09-03 DIAGNOSIS — Z1731 Human epidermal growth factor receptor 2 positive status: Secondary | ICD-10-CM | POA: Insufficient documentation

## 2023-09-03 DIAGNOSIS — Z79810 Long term (current) use of selective estrogen receptor modulators (SERMs): Secondary | ICD-10-CM | POA: Diagnosis not present

## 2023-09-03 DIAGNOSIS — Z170 Estrogen receptor positive status [ER+]: Secondary | ICD-10-CM | POA: Diagnosis not present

## 2023-09-03 LAB — CBC WITH DIFFERENTIAL (CANCER CENTER ONLY)
Abs Immature Granulocytes: 0 10*3/uL (ref 0.00–0.07)
Basophils Absolute: 0 10*3/uL (ref 0.0–0.1)
Basophils Relative: 1 %
Eosinophils Absolute: 0.1 10*3/uL (ref 0.0–0.5)
Eosinophils Relative: 5 %
HCT: 31.1 % — ABNORMAL LOW (ref 36.0–46.0)
Hemoglobin: 10.2 g/dL — ABNORMAL LOW (ref 12.0–15.0)
Immature Granulocytes: 0 %
Lymphocytes Relative: 41 %
Lymphs Abs: 0.9 10*3/uL (ref 0.7–4.0)
MCH: 28 pg (ref 26.0–34.0)
MCHC: 32.8 g/dL (ref 30.0–36.0)
MCV: 85.4 fL (ref 80.0–100.0)
Monocytes Absolute: 0.3 10*3/uL (ref 0.1–1.0)
Monocytes Relative: 11 %
Neutro Abs: 0.9 10*3/uL — ABNORMAL LOW (ref 1.7–7.7)
Neutrophils Relative %: 42 %
Platelet Count: 129 10*3/uL — ABNORMAL LOW (ref 150–400)
RBC: 3.64 MIL/uL — ABNORMAL LOW (ref 3.87–5.11)
RDW: 14.6 % (ref 11.5–15.5)
WBC Count: 2.3 10*3/uL — ABNORMAL LOW (ref 4.0–10.5)
nRBC: 0 % (ref 0.0–0.2)

## 2023-09-03 LAB — TSH: TSH: 2.273 u[IU]/mL (ref 0.350–4.500)

## 2023-09-06 NOTE — Assessment & Plan Note (Signed)
 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  She is status post neoadjuvant chemotherapy, lumpectomy, adjuvant radiation, maintenance Herceptin Perjeta, and tamoxifen that began in August 2024 She is here for telephone follow-up.  She continues to report extreme fatigue.  She had some blood work recently which showed persistent pancytopenia. We have reviewed her lab results which shows ongoing anemia, mild leukopenia and mild thrombocytopenia.  I have asked for additional blood work including repeating iron panel with ferritin, B12, folic acid, SPEP and ANA for further investigation of pancytopenia.  If she continues to be iron deficient, we will consider intravenous iron since she remains very symptomatic and anemic despite oral iron supplementation.  She is agreeable to this plan.  She will come back this afternoon.  Will otherwise plan to see her as scheduled.

## 2023-09-06 NOTE — Progress Notes (Unsigned)
 Oncology History  Malignant neoplasm of upper-outer quadrant of left breast in female, estrogen receptor positive (HCC)  04/25/2022 Imaging   Patient had diagnostic bilateral mammogram given palpable mass in the left breast.  This showed a 2.6 x 3.5 x 3 cm irregular mass as well as possible satellite mass which is indeterminate.  An ultrasound was recommended.  Ultrasound of the left breast mass showed 3.9 x 2.4 x 2.3 cm irregular mass with an irregular and spiculated margin at 3:00 middle depth 5 cm from the nipple, irregular mass is hypoechoic.  In the left breast at 1 o'clock position, 3 cm from the nipple there is a round mass measuring 0.3 x 0.2 x 0.3 cm.  No significant abnormalities were seen sonographically in the left axilla.  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  Left breast needle core biopsy at 1:00 3 cm from the nipple showed fibrocystic changes with usual ductal hyperplasia negative for malignancy.  Prognostic showed ER 95% PR 40%, HER2 3+ KI of 50%   05/08/2022 Cancer Staging   Staging form: Breast, AJCC 8th Edition - Clinical stage from 05/08/2022: Stage IB (cT2, cN0, cM0, G2, ER+, PR+, HER2+) - Signed by Ronny Bacon, PA-C on 05/08/2022 Stage prefix: Initial diagnosis Method of lymph node assessment: Clinical Histologic grading system: 3 grade system   05/18/2022 - 09/16/2022 Chemotherapy   Patient is on Treatment Plan : BREAST  Docetaxel + Carboplatin + Trastuzumab + Pertuzumab  (TCHP) q21d      05/23/2022 Genetic Testing   Negative genetics for [facility] CustomNext-Cancer +RNAinsight Panel.  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 Report date is 05/23/2022.   The CustomNext-Cancer+RNAinsight panel offered by [facility]  includes sequencing and rearrangement analysis for the following 47 genes:  APC, ATM, AXIN2, BARD1, BMPR1A, BRCA1, BRCA2, BRIP1, CDH1, CDK4, CDKN2A, CHEK2, DICER1, EPCAM, GREM1, HOXB13, MEN1, MLH1, MSH2, MSH3, MSH6, MUTYH, NBN, NF1, NF2, NTHL1, PALB2, PMS2, POLD1, POLE, PTEN, RAD51C, RAD51D, RECQL, RET, SDHA, SDHAF2, SDHB, SDHC, SDHD, SMAD4, SMARCA4, STK11, TP53, TSC1, TSC2, and VHL.  RNA data is routinely analyzed for use in variant interpretation for all genes.   10/17/2022 -  Chemotherapy   Patient is on Treatment Plan : BREAST Trastuzumab + Pertuzumab q21d x 11 cycles     11/07/2022 Surgery   Left breast lumpectomy: No residual invasive carcinoma or DCIS.  3 SLN negative for cancer, ypT0,pN0   11/07/2022 Cancer Staging   Staging form: Breast, AJCC 8th Edition - Pathologic stage from 11/07/2022: No Stage Recommended (ypT0, pN0, cM0) - Signed by Loa Socks, NP on 11/28/2022 Stage prefix: Post-therapy   12/18/2022 - 01/16/2023 Radiation Therapy   Plan Name: Breast_L_BH Site: Breast, Left Technique: 3D Mode: Photon Dose Per Fraction: 2.66 Gy Prescribed Dose (Delivered / Prescribed): 42.56 Gy / 42.56 Gy Prescribed Fxs (Delivered / Prescribed): 16 / 16   Plan Name: Brst_L_Bst_BH Site: Breast, Left Technique: 3D Mode: Photon Dose Per Fraction: 2 Gy Prescribed Dose (Delivered / Prescribed): 8 Gy / 8 Gy Prescribed Fxs (Delivered / Prescribed): 4 / 4     02/2023 -  Anti-estrogen oral therapy   Tamoxifen daily     INTERVAL HISTORY:   The patient, with a history of breast cancer, presents for a follow-up visit after  completing treatment. She reports adherence to tamoxifen.  She tells me that she continues to feel tired and she does not quite believe this is necessarily from tamoxifen.  She continues to take oral iron as we previously recommended.  She had some labs recently which showed ongoing leukopenia, anemia and mild thrombocytopenia.  She also had a mammogram since her  last visit here and the biopsy for suspicious calcifications and the pathology findings were benign.    PAST MEDICAL/SURGICAL HISTORY:  Past Medical History:  Diagnosis Date   Anemia    history of   Breast cancer, left (HCC)    Dyspnea    even with speaking   Family history of adverse reaction to anesthesia    mother slow to awaken   Family history of breast cancer 05/10/2022   Family history of ovarian cancer 05/10/2022   Fibromyalgia    GERD (gastroesophageal reflux disease)    History of kidney stones    last stone 6 months ago    Migraine    Seasonal allergies    Tinnitus    Vertigo    Past Surgical History:  Procedure Laterality Date   ADENOIDECTOMY  1991   BREAST BIOPSY  11/06/2022   MM LT RADIOACTIVE SEED LOC MAMMO GUIDE 11/06/2022 [facility]   BREAST BIOPSY  11/06/2022   MM LT RADIOACTIVE SEED EA ADD LESION LOC MAMMO GUIDE 11/06/2022 [facility]   BREAST LUMPECTOMY WITH RADIOACTIVE SEED AND SENTINEL LYMPH NODE BIOPSY Left 11/07/2022   Procedure: LEFT BREAST LUMPECTOMY WITH RADIOACTIVE SEEDX2 AND SENTINEL LYMPH NODE BIOPSY;  Surgeon: Abigail Miyamoto, MD;  Location: [facility];  Service: General;  Laterality: Left;   CYSTOSCOPY WITH RETROGRADE PYELOGRAM, URETEROSCOPY AND STENT PLACEMENT Left 02/22/2021   Procedure: CYSTOSCOPY WITH RETROGRADE PYELOGRAM, URETEROSCOPY AND STENT PLACEMENT;  Surgeon: Belva Agee, MD;  Location: [facility];  Service: Urology;  Laterality: Left;   CYSTOSCOPY/URETEROSCOPY/HOLMIUM LASER/STENT PLACEMENT Left 03/10/2021   Procedure: CYSTOSCOPY RETROGRADE, LEFT URETEROSCOPY/ STONE BASKETRY /HOLMIUM LASER/STENT EXCHANGE;  Surgeon: Crist Fat, MD;  Location: [facility];  Service: Urology;  Laterality: Left;   ESOPHAGOGASTRODUODENOSCOPY N/A 11/17/2013   Procedure: ESOPHAGOGASTRODUODENOSCOPY (EGD);  Surgeon: Rachael Fee, MD;  Location: [facility] END[facility]COPY;  Service: Endoscopy;  Laterality: N/A;   ESOPHAGOGASTRODUODENOSCOPY  N/A 10/15/2015   Procedure: ESOPHAGOGASTRODUODENOSCOPY (EGD);  Surgeon: Iva Boop, MD;  Location: [facility];  Service: Endoscopy;  Laterality: N/A;   HOLMIUM LASER APPLICATION  03/10/2021   Procedure: HOLMIUM LASER APPLICATION;  Surgeon: Crist Fat, MD;  Location: [facility];  Service: Urology;;   NASAL SEPTOPLASTY W/ TURBINOPLASTY Bilateral 03/18/2018   Procedure: BILATERAL NASAL SEPTOPLASTY WITH TURBINATE REDUCTION;  Surgeon: Flo Shanks, MD;  Location: [facility];  Service: ENT;  Laterality: Bilateral;   PORT-A-CATH REMOVAL Right 07/05/2023   Procedure: REMOVAL PORT-A-CATH;  Surgeon: Abigail Miyamoto, MD;  Location: [facility];  Service: General;  Laterality: Right;   PORTACATH PLACEMENT Right 05/17/2022   Procedure: PORT-A-CATH INSERTION WITH ULTRASOUND GUIDANCE;  Surgeon: Abigail Miyamoto, MD;  Location: [facility];  Service: General;  Laterality: Right;   TONSILLECTOMY  1991   TUBAL LIGATION  2005     ALLERGIES:  Allergies  Allergen Reactions   Amoxicillin Shortness Of Breath and Rash   Coconut (Cocos Nucifera) Anaphylaxis   Penicillins Shortness Of Breath and Rash    Has patient had a PCN reaction causing immediate rash, facial/tongue/throat swelling, SOB or lightheadedness with hypotension: yes Has patient had a PCN reaction  causing severe rash involving mucus membranes or skin necrosis: no Has patient had a PCN reaction that required hospitalization no Has patient had a PCN reaction occurring within the last 10 years: no If all of the above answers are "NO", then may proceed with Cephalosporin use.    Duloxetine Other (See Comments)    Caused suicidal thoughts   Hydrocodone Itching   Hydrocodone-Acetaminophen Rash   Moxifloxacin Rash     CURRENT MEDICATIONS:  Outpatient Encounter Medications as of 09/07/2023  Medication Sig   potassium chloride (MICRO-K) 10 MEQ CR capsule Take 2 tablets bid x 3 days then take 2 tabs daily.  (Patient taking differently: Take 20 mEq by mouth in the morning.)   tamoxifen (NOLVADEX) 20 MG tablet Take 1 tablet (20 mg total) by mouth daily.   traMADol (ULTRAM) 50 MG tablet Take 1 tablet (50 mg total) by mouth every 6 (six) hours as needed for moderate pain (pain score 4-6) or severe pain (pain score 7-10).   No facility-administered encounter medications on file as of 09/07/2023.     ONCOLOGIC FAMILY HISTORY:  Family History  Problem Relation Age of Onset   Asthma Mother    Diabetes Mother    Colon polyps Mother    Ovarian cancer Maternal Grandmother 90   Diabetes Maternal Grandmother    Esophageal cancer Maternal Grandfather        d. 72   Breast cancer Other        MGM's sisters x2   Cervical cancer Cousin        mat cousin; dx <40     SOCIAL HISTORY:  Social History   Socioeconomic History   Marital status: Single    Spouse name: Not on file   Number of children: Not on file   Years of education: Not on file   Highest education level: Not on file  Occupational History   Not on file  Tobacco Use   Smoking status: Never    Passive exposure: Past   Smokeless tobacco: Never  Vaping Use   Vaping status: Never Used  Substance and Sexual Activity   Alcohol use: No   Drug use: No   Sexual activity: Not on file  Other Topics Concern   Not on file  Social History Narrative   ** Merged History Encounter **       Social Drivers of Health   Financial Resource Strain: Low Risk  (05/10/2022)   Overall Financial Resource Strain (CARDIA)    Difficulty of Paying Living Expenses: Not very hard  Food Insecurity: No Food Insecurity (12/05/2022)   Hunger Vital Sign    Worried About Running Out of Food in the Last Year: Never true    Ran Out of Food in the Last Year: Never true  Transportation Needs: No Transportation Needs (12/05/2022)   PRAPARE - Administrator, Civil Service (Medical): No    Lack of Transportation (Non-Medical): No  Physical Activity:  Not on file  Stress: Not on file  Social Connections: Not on file  Intimate Partner Violence: Not At Risk (12/05/2022)   Humiliation, Afraid, Rape, and Kick questionnaire    Fear of Current or Ex-Partner: No    Emotionally Abused: No    Physically Abused: No    Sexually Abused: No     OBSERVATIONS/OBJECTIVE:  There were no vitals taken for this visit. GENERAL: Patient is a well appearing female in no acute distress HEENT:  Sclerae anicteric.  Oropharynx clear and moist.  No ulcerations or evidence of oropharyngeal candidiasis. Neck is supple.  NODES:  No cervical, supraclavicular, or axillary lymphadenopathy palpated.  BREAST EXAM:  Deferred. LUNGS:  Clear to auscultation bilaterally.  No wheezes or rhonchi. HEART:  Regular rate and rhythm. No murmur appreciated. ABDOMEN:  Soft, nontender.  Positive, normoactive bowel sounds. No organomegaly palpated. MSK:  No focal spinal tenderness to palpation. Full range of motion bilaterally in the upper extremities. EXTREMITIES:  No peripheral edema.   SKIN:  Clear with no obvious rashes or skin changes. No nail dyscrasia. NEURO:  Nonfocal. Well oriented.  Appropriate affect.   LABORATORY DATA:  None for this visit.  DIAGNOSTIC IMAGING:  None for this visit.      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  She is status post neoadjuvant chemotherapy, lumpectomy, adjuvant radiation, maintenance Herceptin Perjeta, and tamoxifen that began in August 2024 She is here for telephone follow-up.  She continues to report extreme fatigue.  She had some blood work recently which showed persistent pancytopenia. We have reviewed her lab results which shows ongoing anemia, mild leukopenia and mild thrombocytopenia.  I have asked for additional blood  work including repeating iron panel with ferritin, B12, folic acid, SPEP and ANA for further investigation of pancytopenia.  If she continues to be iron deficient, we will consider intravenous iron since she remains very symptomatic and anemic despite oral iron supplementation.  She is agreeable to this plan.  She will come back this afternoon.  Will otherwise plan to see her as scheduled.  I connected with  [name] [name] on 09/07/23 by a telephone application and verified that I am speaking with the correct person using two identifiers.   I discussed the limitations of evaluation and management by telemedicine. The patient expressed understanding and agreed to proceed.  Location of provider: office Location of patient: Clinic.  Time: 10 min *Total Encounter Time as defined by the Centers for Medicare and Medicaid Services includes, in addition to the face-to-face time of a patient visit (documented in the note above) non-face-to-face time: obtaining and reviewing outside history, ordering and reviewing medications, tests or procedures, care coordination (communications with other health care professionals or caregivers) and documentation in the medical record.

## 2023-09-07 ENCOUNTER — Encounter: Payer: Self-pay | Admitting: Hematology and Oncology

## 2023-09-07 ENCOUNTER — Other Ambulatory Visit: Payer: Self-pay | Admitting: *Deleted

## 2023-09-07 ENCOUNTER — Inpatient Hospital Stay: Payer: 59

## 2023-09-07 ENCOUNTER — Inpatient Hospital Stay (HOSPITAL_BASED_OUTPATIENT_CLINIC_OR_DEPARTMENT_OTHER): Payer: 59 | Admitting: Hematology and Oncology

## 2023-09-07 DIAGNOSIS — Z1731 Human epidermal growth factor receptor 2 positive status: Secondary | ICD-10-CM | POA: Diagnosis not present

## 2023-09-07 DIAGNOSIS — C50412 Malignant neoplasm of upper-outer quadrant of left female breast: Secondary | ICD-10-CM

## 2023-09-07 DIAGNOSIS — Z79810 Long term (current) use of selective estrogen receptor modulators (SERMs): Secondary | ICD-10-CM | POA: Diagnosis not present

## 2023-09-07 DIAGNOSIS — Z1721 Progesterone receptor positive status: Secondary | ICD-10-CM | POA: Diagnosis not present

## 2023-09-07 DIAGNOSIS — Z170 Estrogen receptor positive status [ER+]: Secondary | ICD-10-CM

## 2023-09-07 LAB — HEPATITIS PANEL, ACUTE
HCV Ab: NONREACTIVE
Hep A IgM: NONREACTIVE
Hep B C IgM: NONREACTIVE
Hepatitis B Surface Ag: NONREACTIVE

## 2023-09-07 LAB — FERRITIN: Ferritin: 6 ng/mL — ABNORMAL LOW (ref 11–307)

## 2023-09-07 LAB — IRON AND IRON BINDING CAPACITY (CC-WL,HP ONLY)
Iron: 67 ug/dL (ref 28–170)
Saturation Ratios: 15 % (ref 10.4–31.8)
TIBC: 442 ug/dL (ref 250–450)
UIBC: 375 ug/dL (ref 148–442)

## 2023-09-07 LAB — VITAMIN B12: Vitamin B-12: 117 pg/mL — ABNORMAL LOW (ref 180–914)

## 2023-09-11 LAB — FOLATE RBC
Folate, Hemolysate: 333 ng/mL
Folate, RBC: 1009 ng/mL (ref >498)
Hematocrit: 33 % — ABNORMAL LOW (ref 34.0–46.6)

## 2023-09-14 LAB — ANTINUCLEAR ANTIBODIES, IFA: ANA Ab, IFA: NEGATIVE

## 2023-09-17 ENCOUNTER — Encounter: Payer: Self-pay | Admitting: Hematology and Oncology

## 2023-09-17 ENCOUNTER — Telehealth: Payer: Self-pay | Admitting: *Deleted

## 2023-09-17 ENCOUNTER — Telehealth: Payer: Self-pay | Admitting: Adult Health

## 2023-09-17 NOTE — Telephone Encounter (Signed)
 This RN spoke with pt per her my chart message stating concerns about her left breast.  She states the left breast "has always been a little sensitive but over the past week it is really tender "  She states discharge " is like when I take my bra off and it is like dried up on nipple"  She describes the discharge as white.  She does not see that the breast is reddened or swollen " it has always be larger then the other side"  Plan per discussion is pt will see [facility]NP per opening tomorrow.

## 2023-09-17 NOTE — Telephone Encounter (Signed)
 Rescheduled appointment per providers request. Patient is aware of the changes made to her upcoming appointment.

## 2023-09-18 ENCOUNTER — Inpatient Hospital Stay: Attending: Hematology and Oncology | Admitting: Adult Health

## 2023-09-18 ENCOUNTER — Encounter: Admitting: Adult Health

## 2023-09-18 ENCOUNTER — Encounter: Payer: Self-pay | Admitting: Adult Health

## 2023-09-18 ENCOUNTER — Telehealth: Payer: Self-pay | Admitting: *Deleted

## 2023-09-18 VITALS — BP 123/58 | HR 67 | Temp 98.3°F | Resp 15 | Ht 68.0 in | Wt 187.2 lb

## 2023-09-18 DIAGNOSIS — Z8049 Family history of malignant neoplasm of other genital organs: Secondary | ICD-10-CM | POA: Insufficient documentation

## 2023-09-18 DIAGNOSIS — Z9221 Personal history of antineoplastic chemotherapy: Secondary | ICD-10-CM | POA: Diagnosis not present

## 2023-09-18 DIAGNOSIS — Z79810 Long term (current) use of selective estrogen receptor modulators (SERMs): Secondary | ICD-10-CM | POA: Insufficient documentation

## 2023-09-18 DIAGNOSIS — C50412 Malignant neoplasm of upper-outer quadrant of left female breast: Secondary | ICD-10-CM | POA: Diagnosis not present

## 2023-09-18 DIAGNOSIS — Z923 Personal history of irradiation: Secondary | ICD-10-CM | POA: Insufficient documentation

## 2023-09-18 DIAGNOSIS — Z8041 Family history of malignant neoplasm of ovary: Secondary | ICD-10-CM | POA: Insufficient documentation

## 2023-09-18 DIAGNOSIS — L03313 Cellulitis of chest wall: Secondary | ICD-10-CM | POA: Diagnosis not present

## 2023-09-18 DIAGNOSIS — N644 Mastodynia: Secondary | ICD-10-CM | POA: Diagnosis not present

## 2023-09-18 DIAGNOSIS — Z170 Estrogen receptor positive status [ER+]: Secondary | ICD-10-CM | POA: Diagnosis not present

## 2023-09-18 DIAGNOSIS — Z1721 Progesterone receptor positive status: Secondary | ICD-10-CM | POA: Insufficient documentation

## 2023-09-18 DIAGNOSIS — Z803 Family history of malignant neoplasm of breast: Secondary | ICD-10-CM | POA: Diagnosis not present

## 2023-09-18 DIAGNOSIS — Z1731 Human epidermal growth factor receptor 2 positive status: Secondary | ICD-10-CM | POA: Insufficient documentation

## 2023-09-18 LAB — PROTEIN ELECTROPHORESIS, SERUM, WITH REFLEX
A/G Ratio: 1 (ref 0.7–1.7)
Albumin ELP: 3.3 g/dL (ref 2.9–4.4)
Alpha-1-Globulin: 0.2 g/dL (ref 0.0–0.4)
Alpha-2-Globulin: 0.6 g/dL (ref 0.4–1.0)
Beta Globulin: 1 g/dL (ref 0.7–1.3)
Gamma Globulin: 1.3 g/dL (ref 0.4–1.8)
Globulin, Total: 3.2 g/dL (ref 2.2–3.9)
Total Protein ELP: 6.5 g/dL (ref 6.0–8.5)

## 2023-09-18 MED ORDER — DOXYCYCLINE HYCLATE 100 MG PO TABS: 100.0000 mg | ORAL_TABLET | Freq: Two times a day (BID) | ORAL | 0 refills | Status: DC

## 2023-09-18 NOTE — Telephone Encounter (Signed)
 Orders faxed to [facility] with receipt of confirmation noted.

## 2023-09-18 NOTE — Progress Notes (Unsigned)
  Cancer Center Cancer Follow up:    [name], [name], DO [address] [place] [address]   DIAGNOSIS: Cancer Staging  Malignant neoplasm of upper-outer quadrant of left breast in female, estrogen receptor positive (HCC) Staging form: Breast, AJCC 8th Edition - Clinical stage from 05/08/2022: Stage IB (cT2, cN0, cM0, G2, ER+, PR+, HER2+) - Signed by Ronny Bacon, PA-C on 05/08/2022 Stage prefix: Initial diagnosis Method of lymph node assessment: Clinical Histologic grading system: 3 grade system - Pathologic stage from 11/07/2022: No Stage Recommended (ypT0, pN0, cM0) - Signed by Loa Socks, NP on 11/28/2022 Stage prefix: Post-therapy   SUMMARY OF ONCOLOGIC HISTORY: Oncology History  Malignant neoplasm of upper-outer quadrant of left breast in female, estrogen receptor positive (HCC)  04/25/2022 Imaging   Patient had diagnostic bilateral mammogram given palpable mass in the left breast.  This showed a 2.6 x 3.5 x 3 cm irregular mass as well as possible satellite mass which is indeterminate.  An ultrasound was recommended.  Ultrasound of the left breast mass showed 3.9 x 2.4 x 2.3 cm irregular mass with an irregular and spiculated margin at 3:00 middle depth 5 cm from the nipple, irregular mass is hypoechoic.  In the left breast at 1 o'clock position, 3 cm from the nipple there is a round mass measuring 0.3 x 0.2 x 0.3 cm.  No significant abnormalities were seen sonographically in the left axilla.  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  Left breast needle core biopsy at 1:00 3 cm from the nipple showed fibrocystic changes with usual ductal hyperplasia negative for malignancy.  Prognostic showed ER 95% PR 40%,  HER2 3+ KI of 50%   05/08/2022 Cancer Staging   Staging form: Breast, AJCC 8th Edition - Clinical stage from 05/08/2022: Stage IB (cT2, cN0, cM0, G2, ER+, PR+, HER2+) - Signed by Ronny Bacon, PA-C on 05/08/2022 Stage prefix: Initial diagnosis Method of lymph node assessment: Clinical Histologic grading system: 3 grade system   05/18/2022 - 09/16/2022 Chemotherapy   Patient is on Treatment Plan : BREAST  Docetaxel + Carboplatin + Trastuzumab + Pertuzumab  (TCHP) q21d      05/23/2022 Genetic Testing   Negative genetics for [facility] CustomNext-Cancer +RNAinsight Panel.  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 Report date is 05/23/2022.   The CustomNext-Cancer+RNAinsight panel offered by [employer] includes sequencing and rearrangement analysis for the following 47 genes:  APC, ATM, AXIN2, BARD1, BMPR1A, BRCA1, BRCA2, BRIP1, CDH1, CDK4, CDKN2A, CHEK2, DICER1, EPCAM, GREM1, HOXB13, MEN1, MLH1, MSH2, MSH3, MSH6, MUTYH, NBN, NF1, NF2, NTHL1, PALB2, PMS2, POLD1, POLE, PTEN, RAD51C, RAD51D, RECQL, RET, SDHA, SDHAF2, SDHB, SDHC, SDHD, SMAD4, SMARCA4, STK11, TP53, TSC1, TSC2, and VHL.  RNA data is routinely analyzed for use in variant interpretation for all genes.   10/17/2022 -  Chemotherapy   Patient is on Treatment Plan : BREAST Trastuzumab + Pertuzumab q21d x 11 cycles     11/07/2022 Surgery   Left breast lumpectomy: No residual invasive carcinoma or DCIS.  3 SLN negative for cancer, ypT0,pN0   11/07/2022 Cancer Staging   Staging form: Breast, AJCC 8th Edition - Pathologic stage from 11/07/2022: No Stage Recommended (ypT0, pN0, cM0) - Signed by Loa Socks, NP on 11/28/2022  Stage prefix: Post-therapy   12/18/2022 - 01/16/2023 Radiation Therapy   Plan Name: Breast_L_BH Site: Breast, Left Technique: 3D Mode: Photon Dose Per Fraction: 2.66 Gy Prescribed Dose (Delivered / Prescribed): 42.56 Gy / 42.56 Gy Prescribed Fxs (Delivered / Prescribed): 16 / 16   Plan Name:  Brst_L_Bst_BH Site: Breast, Left Technique: 3D Mode: Photon Dose Per Fraction: 2 Gy Prescribed Dose (Delivered / Prescribed): 8 Gy / 8 Gy Prescribed Fxs (Delivered / Prescribed): 4 / 4     02/2023 -  Anti-estrogen oral therapy   Tamoxifen daily     CURRENT THERAPY:  INTERVAL HISTORY: [name] [name] 48 y.o. female returns for    Patient Active Problem List   Diagnosis Date Noted  . Genetic testing 05/30/2022  . Family history of breast cancer 05/10/2022  . Family history of ovarian cancer 05/10/2022  . Malignant neoplasm of upper-outer quadrant of left breast in female, estrogen receptor positive (HCC) 05/08/2022  . Food impaction of esophagus   . Schatzki's ring   . Hx of migraine headaches 11/17/2013  . Ureteral calculus 11/17/2013  . GERD (gastroesophageal reflux disease) 11/17/2013  . Esophageal obstruction due to food impaction 11/17/2013    is allergic to amoxicillin, coconut (cocos nucifera), penicillins, duloxetine, hydrocodone, hydrocodone-acetaminophen, and moxifloxacin.  MEDICAL HISTORY: Past Medical History:  Diagnosis Date  . Anemia    history of  . Breast cancer, left (HCC)   . Dyspnea    even with speaking  . Family history of adverse reaction to anesthesia    mother slow to awaken  . Family history of breast cancer 05/10/2022  . Family history of ovarian cancer 05/10/2022  . Fibromyalgia   . GERD (gastroesophageal reflux disease)   . History of kidney stones    last stone 6 months ago   . Migraine   . Seasonal allergies   . Tinnitus   . Vertigo     SURGICAL HISTORY: Past Surgical History:  Procedure Laterality Date  . ADENOIDECTOMY  1991  . BREAST BIOPSY  11/06/2022   MM LT RADIOACTIVE SEED LOC MAMMO GUIDE 11/06/2022 [facility]  . BREAST BIOPSY  11/06/2022   MM LT RADIOACTIVE SEED EA ADD LESION LOC MAMMO GUIDE 11/06/2022 [facility]  . BREAST LUMPECTOMY WITH RADIOACTIVE SEED AND SENTINEL LYMPH NODE BIOPSY Left 11/07/2022    Procedure: LEFT BREAST LUMPECTOMY WITH RADIOACTIVE SEEDX2 AND SENTINEL LYMPH NODE BIOPSY;  Surgeon: Abigail Miyamoto, MD;  Location: [facility];  Service: General;  Laterality: Left;  . CYSTOSCOPY WITH RETROGRADE PYELOGRAM, URETEROSCOPY AND STENT PLACEMENT Left 02/22/2021   Procedure: CYSTOSCOPY WITH RETROGRADE PYELOGRAM, URETEROSCOPY AND STENT PLACEMENT;  Surgeon: Belva Agee, MD;  Location: [facility];  Service: Urology;  Laterality: Left;  . CYSTOSCOPY/URETEROSCOPY/HOLMIUM LASER/STENT PLACEMENT Left 03/10/2021   Procedure: CYSTOSCOPY RETROGRADE, LEFT URETEROSCOPY/ STONE BASKETRY /HOLMIUM LASER/STENT EXCHANGE;  Surgeon: Crist Fat, MD;  Location: [facility];  Service: Urology;  Laterality: Left;  . ESOPHAGOGASTRODUODENOSCOPY N/A 11/17/2013   Procedure: ESOPHAGOGASTRODUODENOSCOPY (EGD);  Surgeon: Rachael Fee, MD;  Location: [facility] ENDOSCOPY;  Service: Endoscopy;  Laterality: N/A;  . ESOPHAGOGASTRODUODENOSCOPY N/A 10/15/2015   Procedure: ESOPHAGOGASTRODUODENOSCOPY (EGD);  Surgeon: Iva Boop, MD;  Location: [facility] ENDOSCOPY;  Service: Endoscopy;  Laterality: N/A;  . HOLMIUM LASER APPLICATION  03/10/2021   Procedure: HOLMIUM LASER APPLICATION;  Surgeon: Crist Fat, MD;  Location: [facility];  Service: Urology;;  . NASAL SEPTOPLASTY W/ TURBINOPLASTY Bilateral 03/18/2018   Procedure: BILATERAL NASAL SEPTOPLASTY WITH TURBINATE REDUCTION;  Surgeon: Flo Shanks, MD;  Location: [facility];  Service: ENT;  Laterality: Bilateral;  . PORT-A-CATH REMOVAL Right 07/05/2023   Procedure: REMOVAL PORT-A-CATH;  Surgeon: Abigail Miyamoto, MD;  Location: [facility];  Service: General;  Laterality: Right;  . PORTACATH PLACEMENT Right 05/17/2022   Procedure: PORT-A-CATH INSERTION WITH ULTRASOUND GUIDANCE;  Surgeon: Abigail Miyamoto, MD;  Location: [facility];  Service: General;  Laterality: Right;  . TONSILLECTOMY  1991  . TUBAL LIGATION  2005    SOCIAL  HISTORY: Social History   Socioeconomic History  . Marital status: Single    Spouse name: Not on file  . Number of children: Not on file  . Years of education: Not on file  . Highest education level: Not on file  Occupational History  . Not on file  Tobacco Use  . Smoking status: Never    Passive exposure: Past  . Smokeless tobacco: Never  Vaping Use  . Vaping status: Never Used  Substance and Sexual Activity  . Alcohol use: No  . Drug use: No  . Sexual activity: Not on file  Other Topics Concern  . Not on file  Social History Narrative   ** Merged History Encounter **       Social Drivers of Health   Financial Resource Strain: Low Risk  (05/10/2022)   Overall Financial Resource Strain (CARDIA)   . Difficulty of Paying Living Expenses: Not very hard  Food Insecurity: No Food Insecurity (12/05/2022)   Hunger Vital Sign   . Worried About Programme researcher, broadcasting/film/video in the Last Year: Never true   . Ran Out of Food in the Last Year: Never true  Transportation Needs: No Transportation Needs (12/05/2022)   PRAPARE - Transportation   . Lack of Transportation (Medical): No   . Lack of Transportation (Non-Medical): No  Physical Activity: Not on file  Stress: Not on file  Social Connections: Not on file  Intimate Partner Violence: Not At Risk (12/05/2022)   Humiliation, Afraid, Rape, and Kick questionnaire   . Fear of Current or Ex-Partner: No   . Emotionally Abused: No   . Physically Abused: No   . Sexually Abused: No    FAMILY HISTORY: Family History  Problem Relation Age of Onset  . Asthma Mother   . Diabetes Mother   . Colon polyps Mother   . Ovarian cancer Maternal Grandmother 11  . Diabetes Maternal Grandmother   . Esophageal cancer Maternal Grandfather        d. 63  . Breast cancer Other        MGM's sisters x2  . Cervical cancer Cousin        mat cousin; dx <40    Review of Systems - Oncology    PHYSICAL EXAMINATION    There were no vitals filed for this  visit.  Physical Exam  LABORATORY DATA:  CBC    Component Value Date/Time   WBC 2.3 (L) 09/03/2023 0837   WBC 2.9 (L) 06/26/2023 0803   RBC 3.64 (L) 09/03/2023 0837   HGB 10.2 (L) 09/03/2023 0837   HCT 33.0 (L) 09/07/2023 1300   PLT 129 (L) 09/03/2023 0837   MCV 85.4 09/03/2023 0837   MCH 28.0 09/03/2023 0837   MCHC 32.8 09/03/2023 0837   RDW 14.6 09/03/2023 0837   LYMPHSABS 0.9 09/03/2023 0837   MONOABS 0.3 09/03/2023 0837   EOSABS 0.1 09/03/2023 0837   BASOSABS 0.0 09/03/2023 0837    CMP     Component  Value Date/Time   NA 135 06/26/2023 0803   K 3.3 (L) 06/26/2023 0803   CL 102 06/26/2023 0803   CO2 26 06/26/2023 0803   GLUCOSE 104 (H) 06/26/2023 0803   BUN 13 06/26/2023 0803   CREATININE 0.96 06/26/2023 0803   CREATININE 1.07 (H) 05/14/2023 0948   CALCIUM 8.4 (L) 06/26/2023 0803   PROT 6.8 06/26/2023 0803   ALBUMIN 3.4 (L) 06/26/2023 0803   AST 11 (L) 06/26/2023 0803   AST 9 (L) 05/14/2023 0948   ALT 8 06/26/2023 0803   ALT 5 05/14/2023 0948   ALKPHOS 53 06/26/2023 0803   BILITOT 0.4 06/26/2023 0803   BILITOT 0.3 05/14/2023 0948   GFRNONAA >60 06/26/2023 0803   GFRNONAA >60 05/14/2023 0948   GFRAA >90 11/04/2014 1100         ASSESSMENT and THERAPY PLAN:   No problem-specific Assessment & Plan notes found for this encounter.    All questions were answered. The patient knows to call the clinic with any problems, questions or concerns. We can certainly see the patient much sooner if necessary.  Total encounter time:*** minutes*in face-to-face visit time, chart review, lab review, care coordination, order entry, and documentation of the encounter time.    Lillard Anes, NP 09/18/23 12:12 PM Medical Oncology and Hematology [facility] [address] [place], [place] [address] Tel. [phone]    Fax. [phone]  *Total Encounter Time as defined by the Centers for Medicare and Medicaid Services includes, in addition to the  face-to-face time of a patient visit (documented in the note above) non-face-to-face time: obtaining and reviewing outside history, ordering and reviewing medications, tests or procedures, care coordination (communications with other health care professionals or caregivers) and documentation in the medical record.

## 2023-09-20 NOTE — Assessment & Plan Note (Signed)
 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  She is status post neoadjuvant chemotherapy, lumpectomy, adjuvant radiation, maintenance Herceptin Perjeta, and tamoxifen that began in August 2024  H/o left breast cancer. Continues on Tamoxifen with good tolerance.    Left breast pain Significant left breast pain with heat sensation, no visible redness. Differential includes infection or breast lymphedema. Doxycycline prescribed due to amoxicillin allergy. - Prescribe doxycycline. - Recommend compression bra. - Advise ice application. - Suggest one Aleve and Tylenol up to three times daily as needed. - Order mammogram and ultrasound. - Reassess in two weeks after imaging. - Refer to physical therapy if pain persists.

## 2023-09-26 DIAGNOSIS — N644 Mastodynia: Secondary | ICD-10-CM | POA: Diagnosis not present

## 2023-09-26 DIAGNOSIS — N6452 Nipple discharge: Secondary | ICD-10-CM | POA: Diagnosis not present

## 2023-09-26 DIAGNOSIS — Z853 Personal history of malignant neoplasm of breast: Secondary | ICD-10-CM | POA: Diagnosis not present

## 2023-10-02 ENCOUNTER — Inpatient Hospital Stay (HOSPITAL_BASED_OUTPATIENT_CLINIC_OR_DEPARTMENT_OTHER): Admitting: Adult Health

## 2023-10-02 ENCOUNTER — Encounter: Payer: Self-pay | Admitting: Adult Health

## 2023-10-02 DIAGNOSIS — R922 Inconclusive mammogram: Secondary | ICD-10-CM

## 2023-10-02 DIAGNOSIS — N6452 Nipple discharge: Secondary | ICD-10-CM | POA: Diagnosis not present

## 2023-10-02 DIAGNOSIS — N644 Mastodynia: Secondary | ICD-10-CM | POA: Diagnosis not present

## 2023-10-02 DIAGNOSIS — Z170 Estrogen receptor positive status [ER+]: Secondary | ICD-10-CM

## 2023-10-02 DIAGNOSIS — C50412 Malignant neoplasm of upper-outer quadrant of left female breast: Secondary | ICD-10-CM

## 2023-10-02 NOTE — Progress Notes (Unsigned)
 [name] Follow up:    [name], [name], DO [address] [place] [address]   DIAGNOSIS: Cancer Staging  Malignant neoplasm of upper-outer quadrant of left breast in female, estrogen receptor positive (HCC) Staging form: Breast, AJCC 8th Edition - Clinical stage from 05/08/2022: Stage IB (cT2, cN0, cM0, G2, ER+, PR+, HER2+) - Signed by Ronny Bacon, PA-C on 05/08/2022 Stage prefix: Initial diagnosis Method of lymph node assessment: Clinical Histologic grading system: 3 grade system - Pathologic stage from 11/07/2022: No Stage Recommended (ypT0, pN0, cM0) - Signed by Loa Socks, NP on 11/28/2022 Stage prefix: Post-therapy  I connected with [name] on 10/02/23 at  2:00 PM EDT by telephone and verified that I am speaking with the correct person using two identifiers.  I discussed the limitations, risks, security and privacy concerns of performing an evaluation and management service by telephone and the availability of in person appointments.  I also discussed with the patient that there may be a patient responsible charge related to this service. The patient expressed understanding and agreed to proceed.   SUMMARY OF ONCOLOGIC HISTORY: Oncology History  Malignant neoplasm of upper-outer quadrant of left breast in female, estrogen receptor positive (HCC)  04/25/2022 Imaging   Patient had diagnostic bilateral mammogram given palpable mass in the left breast.  This showed a 2.6 x 3.5 x 3 cm irregular mass as well as possible satellite mass which is indeterminate.  An ultrasound was recommended.  Ultrasound of the left breast mass showed 3.9 x 2.4 x 2.3 cm irregular mass with an irregular and spiculated margin at 3:00 middle depth 5 cm from the nipple, irregular mass is hypoechoic.  In the left breast at 1 o'clock position, 3 cm from the nipple there is a round mass measuring 0.3 x 0.2 x 0.3 cm.  No significant abnormalities were seen  sonographically in the left axilla.  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  Left breast needle core biopsy at 1:00 3 cm from the nipple showed fibrocystic changes with usual ductal hyperplasia negative for malignancy.  Prognostic showed ER 95% PR 40%, HER2 3+ KI of 50%   05/08/2022 Cancer Staging   Staging form: Breast, AJCC 8th Edition - Clinical stage from 05/08/2022: Stage IB (cT2, cN0, cM0, G2, ER+, PR+, HER2+) - Signed by Ronny Bacon, PA-C on 05/08/2022 Stage prefix: Initial diagnosis Method of lymph node assessment: Clinical Histologic grading system: 3 grade system   05/18/2022 - 09/16/2022 Chemotherapy   Patient is on Treatment Plan : BREAST  Docetaxel + Carboplatin + Trastuzumab + Pertuzumab  (TCHP) q21d      05/23/2022 Genetic Testing   Negative genetics for [facility] CustomNext-Cancer +RNAinsight Panel.  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 Report date is 05/23/2022.   The CustomNext-Cancer+RNAinsight panel offered by [facility] includes sequencing and rearrangement analysis for the following 47 genes:  APC, ATM, AXIN2, BARD1, BMPR1A, BRCA1, BRCA2, BRIP1, CDH1, CDK4, CDKN2A, CHEK2, DICER1, EPCAM, GREM1, HOXB13, MEN1, MLH1, MSH2, MSH3, MSH6, MUTYH, NBN, NF1, NF2, NTHL1, PALB2, PMS2, POLD1, POLE, PTEN, RAD51C, RAD51D, RECQL, RET, SDHA, SDHAF2, SDHB, SDHC, SDHD, SMAD4, SMARCA4, STK11, TP53, TSC1, TSC2, and VHL.  RNA data is routinely analyzed for use in variant interpretation  for all genes.   10/17/2022 - 06/05/2023 Chemotherapy   Patient is on Treatment Plan : BREAST Trastuzumab + Pertuzumab q21d x 11 cycles     11/07/2022 Surgery   Left breast lumpectomy: No residual invasive carcinoma or DCIS.  3 SLN negative for cancer, ypT0,pN0    11/07/2022 Cancer Staging   Staging form: Breast, AJCC 8th Edition - Pathologic stage from 11/07/2022: No Stage Recommended (ypT0, pN0, cM0) - Signed by Loa Socks, NP on 11/28/2022 Stage prefix: Post-therapy   12/18/2022 - 01/16/2023 Radiation Therapy   Plan Name: Breast_L_BH Site: Breast, Left Technique: 3D Mode: Photon Dose Per Fraction: 2.66 Gy Prescribed Dose (Delivered / Prescribed): 42.56 Gy / 42.56 Gy Prescribed Fxs (Delivered / Prescribed): 16 / 16   Plan Name: Brst_L_Bst_BH Site: Breast, Left Technique: 3D Mode: Photon Dose Per Fraction: 2 Gy Prescribed Dose (Delivered / Prescribed): 8 Gy / 8 Gy Prescribed Fxs (Delivered / Prescribed): 4 / 4     02/2023 -  Anti-estrogen oral therapy   Tamoxifen daily     CURRENT THERAPY:  INTERVAL HISTORY: [name] [name] 48 y.o. female returns for    Patient Active Problem List   Diagnosis Date Noted   Genetic testing 05/30/2022   Family history of breast cancer 05/10/2022   Family history of ovarian cancer 05/10/2022   Malignant neoplasm of upper-outer quadrant of left breast in female, estrogen receptor positive (HCC) 05/08/2022   Food impaction of esophagus    Schatzki's ring    Hx of migraine headaches 11/17/2013   Ureteral calculus 11/17/2013   GERD (gastroesophageal reflux disease) 11/17/2013   Esophageal obstruction due to food impaction 11/17/2013    is allergic to amoxicillin, coconut (cocos nucifera), penicillins, duloxetine, hydrocodone, hydrocodone-acetaminophen, and moxifloxacin.  MEDICAL HISTORY: Past Medical History:  Diagnosis Date   Anemia    history of   Breast cancer, left (HCC)    Dyspnea    even with speaking   Family history of adverse reaction to anesthesia    mother slow to awaken   Family history of breast cancer 05/10/2022   Family history of ovarian cancer 05/10/2022   Fibromyalgia    GERD (gastroesophageal reflux disease)    History of kidney stones    last stone 6  months ago    Migraine    Seasonal allergies    Tinnitus    Vertigo     SURGICAL HISTORY: Past Surgical History:  Procedure Laterality Date   ADENOIDECTOMY  1991   BREAST BIOPSY  11/06/2022   MM LT RADIOACTIVE SEED LOC MAMMO GUIDE 11/06/2022 [facility]   BREAST BIOPSY  11/06/2022   MM LT RADIOACTIVE SEED EA ADD LESION LOC MAMMO GUIDE 11/06/2022 [facility]   BREAST LUMPECTOMY WITH RADIOACTIVE SEED AND SENTINEL LYMPH NODE BIOPSY Left 11/07/2022   Procedure: LEFT BREAST LUMPECTOMY WITH RADIOACTIVE SEEDX2 AND SENTINEL LYMPH NODE BIOPSY;  Surgeon: Abigail Miyamoto, MD;  Location: [facility];  Service: General;  Laterality: Left;   CYSTOSCOPY WITH RETROGRADE PYELOGRAM, URETEROSCOPY AND STENT PLACEMENT Left 02/22/2021   Procedure: CYSTOSCOPY WITH RETROGRADE PYELOGRAM, URETEROSCOPY AND STENT PLACEMENT;  Surgeon: Belva Agee, MD;  Location: [facility];  Service: Urology;  Laterality: Left;   CYSTOSCOPY/URETEROSCOPY/HOLMIUM LASER/STENT PLACEMENT Left 03/10/2021   Procedure: CYSTOSCOPY RETROGRADE, LEFT URETEROSCOPY/ STONE BASKETRY /HOLMIUM LASER/STENT EXCHANGE;  Surgeon: Crist Fat, MD;  Location: [name];  Service: Urology;  Laterality: Left;   ESOPHAGOGASTRODUODENOSCOPY N/A 11/17/2013   Procedure: ESOPHAGOGASTRODUODENOSCOPY (EGD);  Surgeon: Rachael Fee, MD;  Location: [facility];  Service: Endoscopy;  Laterality: N/A;   ESOPHAGOGASTRODUODENOSCOPY N/A 10/15/2015   Procedure: ESOPHAGOGASTRODUODENOSCOPY (EGD);  Surgeon: Iva Boop, MD;  Location: [facility];  Service: Endoscopy;  Laterality: N/A;   HOLMIUM LASER APPLICATION  03/10/2021   Procedure: HOLMIUM LASER APPLICATION;  Surgeon: Crist Fat, MD;  Location: [facility];  Service: Urology;;   NASAL SEPTOPLASTY W/ TURBINOPLASTY Bilateral 03/18/2018   Procedure: BILATERAL NASAL SEPTOPLASTY WITH TURBINATE REDUCTION;  Surgeon: Flo Shanks, MD;  Location: [name];   Service: ENT;  Laterality: Bilateral;   PORT-A-CATH REMOVAL Right 07/05/2023   Procedure: REMOVAL PORT-A-CATH;  Surgeon: Abigail Miyamoto, MD;  Location: [facility];  Service: General;  Laterality: Right;   PORTACATH PLACEMENT Right 05/17/2022   Procedure: PORT-A-CATH INSERTION WITH ULTRASOUND GUIDANCE;  Surgeon: Abigail Miyamoto, MD;  Location: [facility];  Service: General;  Laterality: Right;   TONSILLECTOMY  1991   TUBAL LIGATION  2005    SOCIAL HISTORY: Social History   Socioeconomic History   Marital status: Single    Spouse name: Not on file   Number of children: Not on file   Years of education: Not on file   Highest education level: Not on file  Occupational History   Not on file  Tobacco Use   Smoking status: Never    Passive exposure: Past   Smokeless tobacco: Never  Vaping Use   Vaping status: Never Used  Substance and Sexual Activity   Alcohol use: No   Drug use: No   Sexual activity: Not on file  Other Topics Concern   Not on file  Social History Narrative   ** Merged History Encounter **       Social Drivers of Health   Financial Resource Strain: Low Risk  (05/10/2022)   Overall Financial Resource Strain (CARDIA)    Difficulty of Paying Living Expenses: Not very hard  Food Insecurity: No Food Insecurity (12/05/2022)   Hunger Vital Sign    Worried About Running Out of Food in the Last Year: Never true    Ran Out of Food in the Last Year: Never true  Transportation Needs: No Transportation Needs (12/05/2022)   PRAPARE - Administrator, Civil Service (Medical): No    Lack of Transportation (Non-Medical): No  Physical Activity: Not on file  Stress: Not on file  Social Connections: Not on file  Intimate Partner Violence: Not At Risk (12/05/2022)   Humiliation, Afraid, Rape, and Kick questionnaire    Fear of Current or Ex-Partner: No    Emotionally Abused: No    Physically Abused: No    Sexually Abused: No    FAMILY HISTORY: Family History   Problem Relation Age of Onset   Asthma Mother    Diabetes Mother    Colon polyps Mother    Ovarian cancer Maternal Grandmother 68   Diabetes Maternal Grandmother    Esophageal cancer Maternal Grandfather        d. 49   Breast cancer Other        MGM's sisters x2   Cervical cancer Cousin        mat cousin; dx <40    Review of Systems - Oncology    PHYSICAL EXAMINATION    There were no vitals filed for this visit.  Physical Exam  LABORATORY DATA:  CBC    Component Value Date/Time   WBC 2.3 (L) 09/03/2023 0837   WBC 2.9 (L) 06/26/2023 0803   RBC 3.64 (  L) 09/03/2023 0837   HGB 10.2 (L) 09/03/2023 0837   HCT 33.0 (L) 09/07/2023 1300   PLT 129 (L) 09/03/2023 0837   MCV 85.4 09/03/2023 0837   MCH 28.0 09/03/2023 0837   MCHC 32.8 09/03/2023 0837   RDW 14.6 09/03/2023 0837   LYMPHSABS 0.9 09/03/2023 0837   MONOABS 0.3 09/03/2023 0837   EOSABS 0.1 09/03/2023 0837   BASOSABS 0.0 09/03/2023 0837    CMP     Component Value Date/Time   NA 135 06/26/2023 0803   K 3.3 (L) 06/26/2023 0803   CL 102 06/26/2023 0803   CO2 26 06/26/2023 0803   GLUCOSE 104 (H) 06/26/2023 0803   BUN 13 06/26/2023 0803   CREATININE 0.96 06/26/2023 0803   CREATININE 1.07 (H) 05/14/2023 0948   CALCIUM 8.4 (L) 06/26/2023 0803   PROT 6.8 06/26/2023 0803   ALBUMIN 3.4 (L) 06/26/2023 0803   AST 11 (L) 06/26/2023 0803   AST 9 (L) 05/14/2023 0948   ALT 8 06/26/2023 0803   ALT 5 05/14/2023 0948   ALKPHOS 53 06/26/2023 0803   BILITOT 0.4 06/26/2023 0803   BILITOT 0.3 05/14/2023 0948   GFRNONAA >60 06/26/2023 0803   GFRNONAA >60 05/14/2023 0948   GFRAA >90 11/04/2014 1100       PENDING LABS:   RADIOGRAPHIC STUDIES:  No results found.   PATHOLOGY:     ASSESSMENT and THERAPY PLAN:   No problem-specific Assessment & Plan notes found for this encounter.   No orders of the defined types were placed in this encounter.   All questions were answered. The patient knows to call  the clinic with any problems, questions or concerns. We can certainly see the patient much sooner if necessary. This note was electronically signed. Noreene Filbert, NP 10/02/2023

## 2023-10-03 ENCOUNTER — Telehealth: Payer: Self-pay | Admitting: Adult Health

## 2023-10-03 NOTE — Assessment & Plan Note (Signed)
 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  She is status post neoadjuvant chemotherapy, lumpectomy, adjuvant radiation, maintenance Herceptin Perjeta, and tamoxifen that began in August 2024  H/o left breast cancer. Continues on Tamoxifen with good tolerance.    Left breast pain Significant left breast pain with heat sensation, no visible redness. Completed Doxycline, mammogram and ultrasound demonstrated no abnormality, breast density C.  New nipple discharge.   - Breast MRI w/wo contrast to evaluate considering breast cancer history, age, breast density, and persistent breast pain and nipple discharge in setting of no abnormality noted on mammogram - Refer to PT if MRI negative - F/u after MRI to review results and next steps.

## 2023-10-03 NOTE — Telephone Encounter (Signed)
 Scheduled appointment per 3/25 los. Talked with the patient and she is aware of the made appointment.

## 2023-10-04 ENCOUNTER — Encounter: Payer: Self-pay | Admitting: Adult Health

## 2023-10-09 ENCOUNTER — Other Ambulatory Visit: Payer: Self-pay | Admitting: Hematology and Oncology

## 2023-10-10 ENCOUNTER — Ambulatory Visit
Admission: RE | Admit: 2023-10-10 | Discharge: 2023-10-10 | Disposition: A | Discharge status: Home / Self Care | Source: Ambulatory Visit | Attending: Adult Health | Admitting: Adult Health

## 2023-10-10 DIAGNOSIS — N644 Mastodynia: Secondary | ICD-10-CM | POA: Diagnosis not present

## 2023-10-10 DIAGNOSIS — Z853 Personal history of malignant neoplasm of breast: Secondary | ICD-10-CM | POA: Diagnosis not present

## 2023-10-10 DIAGNOSIS — N6452 Nipple discharge: Secondary | ICD-10-CM

## 2023-10-10 DIAGNOSIS — Z170 Estrogen receptor positive status [ER+]: Secondary | ICD-10-CM

## 2023-10-10 DIAGNOSIS — R922 Inconclusive mammogram: Secondary | ICD-10-CM

## 2023-10-10 MED ORDER — GADOPICLENOL 0.5 MMOL/ML IV SOLN
7.5000 mL | Freq: Once | INTRAVENOUS | Status: AC | PRN
  Administered 2023-10-10: 7.5 mL via INTRAVENOUS

## 2023-10-16 ENCOUNTER — Other Ambulatory Visit: Payer: Self-pay | Admitting: Surgery

## 2023-10-16 DIAGNOSIS — Z853 Personal history of malignant neoplasm of breast: Secondary | ICD-10-CM | POA: Diagnosis not present

## 2023-10-16 DIAGNOSIS — N6042 Mammary duct ectasia of left breast: Secondary | ICD-10-CM | POA: Diagnosis not present

## 2023-10-16 DIAGNOSIS — N6452 Nipple discharge: Secondary | ICD-10-CM | POA: Diagnosis not present

## 2023-10-18 LAB — SURGICAL PATHOLOGY

## 2023-10-24 ENCOUNTER — Inpatient Hospital Stay: Admitting: Adult Health

## 2023-10-29 ENCOUNTER — Encounter: Payer: Self-pay | Admitting: Adult Health

## 2023-10-29 ENCOUNTER — Inpatient Hospital Stay: Attending: Hematology and Oncology | Admitting: Adult Health

## 2023-10-29 VITALS — BP 111/55 | HR 82 | Temp 98.3°F | Resp 16 | Wt 194.4 lb

## 2023-10-29 DIAGNOSIS — Z923 Personal history of irradiation: Secondary | ICD-10-CM | POA: Diagnosis not present

## 2023-10-29 DIAGNOSIS — Z1731 Human epidermal growth factor receptor 2 positive status: Secondary | ICD-10-CM | POA: Diagnosis not present

## 2023-10-29 DIAGNOSIS — I890 Lymphedema, not elsewhere classified: Secondary | ICD-10-CM

## 2023-10-29 DIAGNOSIS — E538 Deficiency of other specified B group vitamins: Secondary | ICD-10-CM

## 2023-10-29 DIAGNOSIS — C50412 Malignant neoplasm of upper-outer quadrant of left female breast: Secondary | ICD-10-CM | POA: Diagnosis not present

## 2023-10-29 DIAGNOSIS — Z8041 Family history of malignant neoplasm of ovary: Secondary | ICD-10-CM | POA: Diagnosis not present

## 2023-10-29 DIAGNOSIS — Z803 Family history of malignant neoplasm of breast: Secondary | ICD-10-CM | POA: Diagnosis not present

## 2023-10-29 DIAGNOSIS — D508 Other iron deficiency anemias: Secondary | ICD-10-CM

## 2023-10-29 DIAGNOSIS — N644 Mastodynia: Secondary | ICD-10-CM | POA: Diagnosis not present

## 2023-10-29 DIAGNOSIS — N6452 Nipple discharge: Secondary | ICD-10-CM | POA: Insufficient documentation

## 2023-10-29 DIAGNOSIS — Z1721 Progesterone receptor positive status: Secondary | ICD-10-CM | POA: Diagnosis not present

## 2023-10-29 DIAGNOSIS — Z9221 Personal history of antineoplastic chemotherapy: Secondary | ICD-10-CM | POA: Insufficient documentation

## 2023-10-29 DIAGNOSIS — Z170 Estrogen receptor positive status [ER+]: Secondary | ICD-10-CM | POA: Insufficient documentation

## 2023-10-29 DIAGNOSIS — Z79810 Long term (current) use of selective estrogen receptor modulators (SERMs): Secondary | ICD-10-CM | POA: Diagnosis not present

## 2023-10-29 NOTE — Progress Notes (Signed)
 [facility] Cancer Follow up:    [name], [name], DO [address] [place] [address]   DIAGNOSIS:  Cancer Staging  Malignant neoplasm of upper-outer quadrant of left breast in female, estrogen receptor positive (HCC) Staging form: Breast, AJCC 8th Edition - Clinical stage from 05/08/2022: Stage IB (cT2, cN0, cM0, G2, ER+, PR+, HER2+) - Signed by Bettejane Brownie, PA-C on 05/08/2022 Stage prefix: Initial diagnosis Method of lymph node assessment: Clinical Histologic grading system: 3 grade system - Pathologic stage from 11/07/2022: No Stage Recommended (ypT0, pN0, cM0) - Signed by Percival Brace, NP on 11/28/2022 Stage prefix: Post-therapy    SUMMARY OF ONCOLOGIC HISTORY: Oncology History  Malignant neoplasm of upper-outer quadrant of left breast in female, estrogen receptor positive (HCC)  04/25/2022 Imaging   Patient had diagnostic bilateral mammogram given palpable mass in the left breast.  This showed a 2.6 x 3.5 x 3 cm irregular mass as well as possible satellite mass which is indeterminate.  An ultrasound was recommended.  Ultrasound of the left breast mass showed 3.9 x 2.4 x 2.3 cm irregular mass with an irregular and spiculated margin at 3:00 middle depth 5 cm from the nipple, irregular mass is hypoechoic.  In the left breast at 1 o'clock position, 3 cm from the nipple there is a round mass measuring 0.3 x 0.2 x 0.3 cm.  No significant abnormalities were seen sonographically in the left axilla.  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  Left breast needle core biopsy at 1:00 3 cm from the nipple showed fibrocystic changes with usual ductal hyperplasia negative for malignancy.  Prognostic showed ER 95% PR  40%, HER2 3+ KI of 50%   05/08/2022 Cancer Staging   Staging form: Breast, AJCC 8th Edition - Clinical stage from 05/08/2022: Stage IB (cT2, cN0, cM0, G2, ER+, PR+, HER2+) - Signed by Bettejane Brownie, PA-C on 05/08/2022 Stage prefix: Initial diagnosis Method of lymph node assessment: Clinical Histologic grading system: 3 grade system   05/18/2022 - 09/16/2022 Chemotherapy   Patient is on Treatment Plan : BREAST  Docetaxel  + Carboplatin  + Trastuzumab  + Pertuzumab   (TCHP) q21d      05/23/2022 Genetic Testing   Negative genetics for [facility] CustomNext-Cancer +RNAinsight Panel.  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 Report date is 05/23/2022.   The CustomNext-Cancer+RNAinsight panel offered by [employer] includes sequencing and rearrangement analysis for the following 47 genes:  APC, ATM, AXIN2, BARD1, BMPR1A, BRCA1, BRCA2, BRIP1, CDH1, CDK4, CDKN2A, CHEK2, DICER1, EPCAM, GREM1, HOXB13, MEN1, MLH1, MSH2, MSH3, MSH6, MUTYH, NBN, NF1, NF2, NTHL1, PALB2, PMS2, POLD1, POLE, PTEN, RAD51C, RAD51D, RECQL, RET, SDHA, SDHAF2, SDHB, SDHC, SDHD, SMAD4, SMARCA4, STK11, TP53, TSC1, TSC2, and VHL.  RNA data is routinely analyzed for use in variant interpretation for all genes.   10/17/2022 - 06/05/2023 Chemotherapy   Patient is on Treatment Plan : BREAST Trastuzumab  + Pertuzumab  q21d x 11 cycles     11/07/2022 Surgery   Left breast lumpectomy: No residual invasive carcinoma or DCIS.  3 SLN negative for cancer, ypT0,pN0   11/07/2022 Cancer Staging   Staging form: Breast, AJCC 8th Edition - Pathologic stage from 11/07/2022: No Stage Recommended (ypT0, pN0, cM0) - Signed by Percival Brace, NP  on 11/28/2022 Stage prefix: Post-therapy   12/18/2022 - 01/16/2023 Radiation Therapy   Plan Name: Breast_L_BH Site: Breast, Left Technique: 3D Mode: Photon Dose Per Fraction: 2.66 Gy Prescribed Dose (Delivered / Prescribed): 42.56 Gy / 42.56 Gy Prescribed Fxs (Delivered / Prescribed): 16 / 16   Plan Name:  Brst_L_Bst_BH Site: Breast, Left Technique: 3D Mode: Photon Dose Per Fraction: 2 Gy Prescribed Dose (Delivered / Prescribed): 8 Gy / 8 Gy Prescribed Fxs (Delivered / Prescribed): 4 / 4     02/2023 -  Anti-estrogen oral therapy   Tamoxifen  daily     CURRENT THERAPY: Tamoxifen   INTERVAL HISTORY:  Discussed the use of AI scribe software for clinical note transcription with the patient, who gave verbal consent to proceed.  [name] [name] 48 y.o. female  with a history of breast cancer, presents with persistent pain and discharge from the nipple. The pain is described as a heavy feeling, most pronounced under the arm and on the side of the breast. The patient has undergone a series of tests including a mammogram, ultrasound, MRI, and a biopsy, all of which have not identified a clear cause for the symptoms. The patient has been on antibiotics and has also tried other treatments without significant improvement. The patient also reports a history of low iron and B12 levels and is currently on tamoxifen .   Patient Active Problem List   Diagnosis Date Noted   Genetic testing 05/30/2022   Family history of breast cancer 05/10/2022   Family history of ovarian cancer 05/10/2022   Malignant neoplasm of upper-outer quadrant of left breast in female, estrogen receptor positive (HCC) 05/08/2022   Food impaction of esophagus    Schatzki's ring    Hx of migraine headaches 11/17/2013   Ureteral calculus 11/17/2013   GERD (gastroesophageal reflux disease) 11/17/2013   Esophageal obstruction due to food impaction 11/17/2013    is allergic to amoxicillin, coconut (cocos nucifera), penicillins, duloxetine, hydrocodone , hydrocodone -acetaminophen , and moxifloxacin.  MEDICAL HISTORY: Past Medical History:  Diagnosis Date   Anemia    history of   Breast cancer, left (HCC)    Dyspnea    even with speaking   Family history of adverse reaction to anesthesia    mother slow to awaken   Family  history of breast cancer 05/10/2022   Family history of ovarian cancer 05/10/2022   Fibromyalgia    GERD (gastroesophageal reflux disease)    History of kidney stones    last stone 6 months ago    Migraine    Seasonal allergies    Tinnitus    Vertigo     SURGICAL HISTORY: Past Surgical History:  Procedure Laterality Date   ADENOIDECTOMY  1991   BREAST BIOPSY  11/06/2022   MM LT RADIOACTIVE SEED LOC MAMMO GUIDE 11/06/2022 [facility]MO[facility]   BREAST BIOPSY  11/06/2022   MM LT RADIOACTIVE SEED EA ADD LESION LOC MAMMO GUIDE 11/06/2022 [facility]   BREAST LUMPECTOMY WITH RADIOACTIVE SEED AND SENTINEL LYMPH NODE BIOPSY Left 11/07/2022   Procedure: LEFT BREAST LUMPECTOMY WITH RADIOACTIVE SEEDX2 AND SENTINEL LYMPH NODE BIOPSY;  Surgeon: Oza Blumenthal, MD;  Location: [facility];  Service: General;  Laterality: Left;   CYSTOSCOPY WITH RETROGRADE PYELOGRAM, URETEROSCOPY AND STENT PLACEMENT Left 02/22/2021   Procedure: CYSTOSCOPY WITH RETROGRADE PYELOGRAM, URETEROSCOPY AND STENT PLACEMENT;  Surgeon: Sherlyn Ditto, MD;  Location: [facility];  Service: Urology;  Laterality: Left;   CYSTOSCOPY/URETEROSCOPY/HOLMIUM LASER/STENT PLACEMENT Left 03/10/2021   Procedure: CYSTOSCOPY RETROGRADE, LEFT URETEROSCOPY/ STONE BASKETRY [place]  LASER/STENT EXCHANGE;  Surgeon: Andrez Banker, MD;  Location[facility];  Service: Urology;  Laterality: Left;   ESOPHAGOGASTRODUODENOSCOPY N/A 11/17/2013   Procedure: ESOPHAGOGASTRODUODENOSCOPY (EGD);  Surgeon: Janel Medford, MD;  Location[facility] END[facility];  Service: Endoscopy;  Laterality: N/A;   ESOPHAGOGASTRODUODENOSCOPY N/A 10/15/2015   Procedure: ESOPHAGOGASTRODUODENOSCOPY (EGD);  Surgeon: Kenney Peacemaker, MD;  Location: [facility] ENDOSCOPY;  Service: Endoscopy;  Laterality: N/A;   HOLMIUM LASER APPLICATION  03/10/2021   Procedure: HOLMIUM LASER APPLICATION;  Surgeon: Andrez Banker, MD;  Location: [facility];  Service: Urology;;    NASAL SEPTOPLASTY W/ TURBINOPLASTY Bilateral 03/18/2018   Procedure: BILATERAL NASAL SEPTOPLASTY WITH TURBINATE REDUCTION;  Surgeon: Lenton Rail, MD;  Location: [facility];  Service: ENT;  Laterality: Bilateral;   PORT-A-CATH REMOVAL Right 07/05/2023   Procedure: REMOVAL PORT-A-CATH;  Surgeon: Oza Blumenthal, MD;  Location: [facility];  Service: General;  Laterality: Right;   PORTACATH PLACEMENT Right 05/17/2022   Procedure: PORT-A-CATH INSERTION WITH ULTRASOUND GUIDANCE;  Surgeon: Oza Blumenthal, MD;  Location: [facility];  Service: General;  Laterality: Right;   TONSILLECTOMY  1991   TUBAL LIGATION  2005    SOCIAL HISTORY: Social History   Socioeconomic History   Marital status: Single    Spouse name: Not on file   Number of children: Not on file   Years of education: Not on file   Highest education level: Not on file  Occupational History   Not on file  Tobacco Use   Smoking status: Never    Passive exposure: Past   Smokeless tobacco: Never  Vaping Use   Vaping status: Never Used  Substance and Sexual Activity   Alcohol use: No   Drug use: No   Sexual activity: Not on file  Other Topics Concern   Not on file  Social History Narrative   ** Merged History Encounter **       Social Drivers of Health   Financial Resource Strain: Low Risk  (05/10/2022)   Overall Financial Resource Strain (CARDIA)    Difficulty of Paying Living Expenses: Not very hard  Food Insecurity: No Food Insecurity (12/05/2022)   Hunger Vital Sign    Worried About Running Out of Food in the Last Year: Never true    Ran Out of Food in the Last Year: Never true  Transportation Needs: No Transportation Needs (12/05/2022)   PRAPARE - Administrator, Civil Service (Medical): No    Lack of Transportation (Non-Medical): No  Physical Activity: Not on file  Stress: Not on file  Social Connections: Not on file  Intimate Partner Violence: Not At Risk (12/05/2022)   Humiliation,  Afraid, Rape, and Kick questionnaire    Fear of Current or Ex-Partner: No    Emotionally Abused: No    Physically Abused: No    Sexually Abused: No    FAMILY HISTORY: Family History  Problem Relation Age of Onset   Asthma Mother    Diabetes Mother    Colon polyps Mother    Ovarian cancer Maternal Grandmother 80   Diabetes Maternal Grandmother    Esophageal cancer Maternal Grandfather        d. 36   Breast cancer Other        MGM's sisters x2   Cervical cancer Cousin        mat cousin; dx <40    Review of Systems  Constitutional:  Negative for appetite change, chills, fatigue, fever and unexpected weight  change.  HENT:   Negative for hearing loss, lump/mass and trouble swallowing.   Eyes:  Negative for eye problems and icterus.  Respiratory:  Negative for chest tightness, cough and shortness of breath.   Cardiovascular:  Negative for chest pain, leg swelling and palpitations.  Gastrointestinal:  Negative for abdominal distention, abdominal pain, constipation, diarrhea, nausea and vomiting.  Endocrine: Negative for hot flashes.  Genitourinary:  Negative for difficulty urinating.   Musculoskeletal:  Negative for arthralgias.  Skin:  Negative for itching and rash.  Neurological:  Negative for dizziness, extremity weakness, headaches and numbness.  Hematological:  Negative for adenopathy. Does not bruise/bleed easily.  Psychiatric/Behavioral:  Negative for depression. The patient is not nervous/anxious.       PHYSICAL EXAMINATION    Vitals:   10/29/23 0924  BP: (!) 111/55  Pulse: 82  Resp: 16  Temp: 98.3 F (36.8 C)  SpO2: 99%    Physical Exam Constitutional:      General: She is not in acute distress.    Appearance: Normal appearance. She is not toxic-appearing.  HENT:     Head: Normocephalic and atraumatic.     Mouth/Throat:     Mouth: Mucous membranes are moist.     Pharynx: Oropharynx is clear. No oropharyngeal exudate or posterior oropharyngeal erythema.   Eyes:     General: No scleral icterus. Cardiovascular:     Rate and Rhythm: Normal rate and regular rhythm.     Pulses: Normal pulses.     Heart sounds: Normal heart sounds.  Pulmonary:     Effort: Pulmonary effort is normal.     Breath sounds: Normal breath sounds.  Chest:     Comments: Very mild crusting around left nipple and tenderness to palpation, otherwise benign, no sign of recurrence, right breast benign Abdominal:     General: Abdomen is flat. Bowel sounds are normal. There is no distension.     Palpations: Abdomen is soft.     Tenderness: There is no abdominal tenderness.  Musculoskeletal:        General: No swelling.     Cervical back: Neck supple.  Lymphadenopathy:     Cervical: No cervical adenopathy.     Upper Body:     Right upper body: No supraclavicular or axillary adenopathy.     Left upper body: No supraclavicular or axillary adenopathy.  Skin:    General: Skin is warm and dry.     Findings: No rash.  Neurological:     General: No focal deficit present.     Mental Status: She is alert.  Psychiatric:        Mood and Affect: Mood normal.        Behavior: Behavior normal.      ASSESSMENT and THERAPY PLAN:   Malignant neoplasm of upper-outer quadrant of left breast in female, estrogen receptor positive (HCC) Breast pain and nipple discharge Persistent symptoms post-radiation. MRI showed nipple enhancement; biopsy normal. Possible breast lymphedema. No infection or erythema. - Refer to breast physical therapy for possible lymphedema management. - Instruct her to report worsening symptoms. - F/u on 5/30 with Dr. Enzo Has prior  Iron deficiency Low iron levels previously. Currently on daily iron supplements. Plan to assess response. - Recheck iron levels, B12, blood counts, and potassium in May when she returns.   Vitamin B12 supplementation No energy improvement with current B12 form. Recommend sublingual for better absorption. - Switch to  sublingual vitamin B12.  Menstrual irregularities Irregular cycles possibly due to tamoxifen .  Tubal ligation excludes pregnancy.  Medication management Currently on tamoxifen , tramadol  discontinued. Potassium levels stable. - Continue tamoxifen . - Remove tramadol  from medication list.   All questions were answered. The patient knows to call the clinic with any problems, questions or concerns. We can certainly see the patient much sooner if necessary.  Total encounter time:20 minutes*in face-to-face visit time, chart review, lab review, care coordination, order entry, and documentation of the encounter time.    Alwin Baars, NP 10/29/23 2:21 PM Medical Oncology and Hematology [facility] [address] [place], [place] [address] Tel. [phone]    Fax. [phone]  *Total Encounter Time as defined by the Centers for Medicare and Medicaid Services includes, in addition to the face-to-face time of a patient visit (documented in the note above) non-face-to-face time: obtaining and reviewing outside history, ordering and reviewing medications, tests or procedures, care coordination (communications with other health care professionals or caregivers) and documentation in the medical record.

## 2023-10-29 NOTE — Assessment & Plan Note (Signed)
 Breast pain and nipple discharge Persistent symptoms post-radiation. MRI showed nipple enhancement; biopsy normal. Possible breast lymphedema. No infection or erythema. - Refer to breast physical therapy for possible lymphedema management. - Instruct her to report worsening symptoms. - F/u on 5/30 with Dr. Enzo Has prior  Iron deficiency Low iron levels previously. Currently on daily iron supplements. Plan to assess response. - Recheck iron levels, B12, blood counts, and potassium in May when she returns.   Vitamin B12 supplementation No energy improvement with current B12 form. Recommend sublingual for better absorption. - Switch to sublingual vitamin B12.  Menstrual irregularities Irregular cycles possibly due to tamoxifen . Tubal ligation excludes pregnancy.  Medication management Currently on tamoxifen , tramadol  discontinued. Potassium levels stable. - Continue tamoxifen . - Remove tramadol  from medication list.

## 2023-10-31 ENCOUNTER — Ambulatory Visit

## 2023-11-06 ENCOUNTER — Other Ambulatory Visit: Payer: Self-pay

## 2023-11-06 ENCOUNTER — Encounter: Payer: Self-pay | Admitting: Rehabilitation

## 2023-11-06 ENCOUNTER — Ambulatory Visit: Attending: Adult Health | Admitting: Rehabilitation

## 2023-11-06 DIAGNOSIS — I890 Lymphedema, not elsewhere classified: Secondary | ICD-10-CM | POA: Insufficient documentation

## 2023-11-06 DIAGNOSIS — Z9189 Other specified personal risk factors, not elsewhere classified: Secondary | ICD-10-CM | POA: Diagnosis not present

## 2023-11-06 DIAGNOSIS — C50512 Malignant neoplasm of lower-outer quadrant of left female breast: Secondary | ICD-10-CM | POA: Insufficient documentation

## 2023-11-06 DIAGNOSIS — C50412 Malignant neoplasm of upper-outer quadrant of left female breast: Secondary | ICD-10-CM | POA: Diagnosis not present

## 2023-11-06 DIAGNOSIS — M25622 Stiffness of left elbow, not elsewhere classified: Secondary | ICD-10-CM | POA: Insufficient documentation

## 2023-11-06 DIAGNOSIS — Z170 Estrogen receptor positive status [ER+]: Secondary | ICD-10-CM | POA: Diagnosis not present

## 2023-11-06 DIAGNOSIS — N644 Mastodynia: Secondary | ICD-10-CM | POA: Insufficient documentation

## 2023-11-06 NOTE — Therapy (Signed)
 OUTPATIENT PHYSICAL THERAPY  UPPER EXTREMITY ONCOLOGY EVALUATION  Patient Name: DEOVION TROSS MRN: [id number] DOB:August 30, 1975, 48 y.o., female Today's Date: 11/06/2023  END OF SESSION:  PT End of Session - 11/06/23 1500     Visit Number 1    Number of Visits 9    Date for PT Re-Evaluation 12/04/23    Authorization Type not needed    PT Start Time 1400    PT Stop Time 1455    PT Time Calculation (min) 55 min    Activity Tolerance Patient tolerated treatment well    Behavior During Therapy WFL for tasks assessed/performed             Past Medical History:  Diagnosis Date   Anemia    history of   Breast cancer, left (HCC)    Dyspnea    even with speaking   Family history of adverse reaction to anesthesia    mother slow to awaken   Family history of breast cancer 05/10/2022   Family history of ovarian cancer 05/10/2022   Fibromyalgia    GERD (gastroesophageal reflux disease)    History of kidney stones    last stone 6 months ago    Migraine    Seasonal allergies    Tinnitus    Vertigo    Past Surgical History:  Procedure Laterality Date   ADENOIDECTOMY  1991   BREAST BIOPSY  11/06/2022   MM LT RADIOACTIVE SEED LOC MAMMO GUIDE 11/06/2022 [facility]   BREAST BIOPSY  11/06/2022   MM LT RADIOACTIVE SEED EA ADD LESION LOC MAMMO GUIDE 11/06/2022 [facility]   BREAST LUMPECTOMY WITH RADIOACTIVE SEED AND SENTINEL LYMPH NODE BIOPSY Left 11/07/2022   Procedure: LEFT BREAST LUMPECTOMY WITH RADIOACTIVE SEEDX2 AND SENTINEL LYMPH NODE BIOPSY;  Surgeon: Oza Blumenthal, MD;  Location: [facility];  Service: General;  Laterality: Left;   CYSTOSCOPY WITH RETROGRADE PYELOGRAM, URETEROSCOPY AND STENT PLACEMENT Left 02/22/2021   Procedure: CYSTOSCOPY WITH RETROGRADE PYELOGRAM, URETEROSCOPY AND STENT PLACEMENT;  Surgeon: Sherlyn Ditto, MD;  Location: [facility];  Service: Urology;  Laterality: Left;   CYSTOSCOPY/URETEROSCOPY/HOLMIUM LASER/STENT PLACEMENT Left 03/10/2021    Procedure: CYSTOSCOPY RETROGRADE, LEFT URETEROSCOPY/ STONE BASKETRY /HOLMIUM LASER/STENT EXCHANGE;  Surgeon: Andrez Banker, MD;  Location[facility];  Service: Urology;  Laterality: Left;   ESOPHAGOGASTRODUODENOSCOPY N/A 11/17/2013   Procedure: ESOPHAGOGASTRODUODENOSCOPY (EGD);  Surgeon: Janel Medford, MD;  Location: [facility] ENDOSCOPY;  Service: Endoscopy;  Laterality: N/A;   ESOPHAGOGASTRODUODENOSCOPY N/A 10/15/2015   Procedure: ESOPHAGOGASTRODUODENOSCOPY (EGD);  Surgeon: Kenney Peacemaker, MD;  Location: [facility] ENDOSCOPY;  Service: Endoscopy;  Laterality: N/A;   HOLMIUM LASER APPLICATION  03/10/2021   Procedure: HOLMIUM LASER APPLICATION;  Surgeon: Andrez Banker, MD;  Location: [facility];  Service: Urology;;   NASAL SEPTOPLASTY W/ TURBINOPLASTY Bilateral 03/18/2018   Procedure: BILATERAL NASAL SEPTOPLASTY WITH TURBINATE REDUCTION;  Surgeon: Lenton Rail, MD;  Location: [facility];  Service: ENT;  Laterality: Bilateral;   PORT-A-CATH REMOVAL Right 07/05/2023   Procedure: REMOVAL PORT-A-CATH;  Surgeon: Oza Blumenthal, MD;  Location: [facility];  Service: General;  Laterality: Right;   PORTACATH PLACEMENT Right 05/17/2022   Procedure: PORT-A-CATH INSERTION WITH ULTRASOUND GUIDANCE;  Surgeon: Oza Blumenthal, MD;  Location: [facility];  Service: General;  Laterality: Right;   TONSILLECTOMY  1991   TUBAL LIGATION  2005   Patient Active Problem List   Diagnosis Date Noted   Genetic testing 05/30/2022   Family history of breast cancer 05/10/2022  Family history of ovarian cancer 05/10/2022   Malignant neoplasm of upper-outer quadrant of left breast in female, estrogen receptor positive (HCC) 05/08/2022   Food impaction of esophagus    Schatzki's ring    Hx of migraine headaches 11/17/2013   Ureteral calculus 11/17/2013   GERD (gastroesophageal reflux disease) 11/17/2013   Esophageal obstruction due to food impaction 11/17/2013    PCP: Shawn  [name], DO  REFERRING PROVIDER: Alwin Baars, NP  REFERRING DIAG:  Diagnosis  C50.412,Z17.0 (ICD-10-CM) - Malignant neoplasm of upper-outer quadrant of left breast in female, estrogen receptor positive (HCC)  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  It started maybe 3 months ago. I started having some heaviness in the breast and more pain in the axilla.  I have 1 compression bra that may help when I sleep in it.  I don't know if it helps during the day.   PERTINENT HISTORY: Completed neoadjuvant chemotherapy TCHP, then Left lumpectomy showing no residual carcinoma and 3 negative nodes 11/07/22. Completed radiation 01/16/23. On tamoxifen .  Imaging shows Left nipple enhancement and biopsy normal. No infection.   PAIN:  Are you having pain? Yes NPRS scale: 8/10 Pain location: left breast  Pain orientation: Left  PAIN TYPE: sharp, throbbing, and tight Pain description: constant  Aggravating factors: in the armpit worse with movement Relieving factors: nothing   PRECAUTIONS: lymphedema risk left   RED FLAGS: None   WEIGHT BEARING RESTRICTIONS: No  FALLS:  Has patient fallen in last 6 months? No  LIVING ENVIRONMENT: Lives with: fiance   OCCUPATION: desk job full time.  I can't type more than 5 min before I start to get pain in the armpit.    LEISURE: needs to pick up granchildren.  83-39 year old   HAND DOMINANCE: right   PRIOR LEVEL OF FUNCTION:  Independent  PATIENT GOALS: decrease the pain    OBJECTIVE: Note: Objective measures were completed at Evaluation unless otherwise noted.  COGNITION: Overall cognitive status: Within functional limits for tasks assessed   PALPATION: No cording noted, no significant tightness noted more so ttp and  limited by pain.  +2 ttp to lateral breast at incision.  Mild breast edema noted / fibrosis  UPPER EXTREMITY AROM/PROM:  A/PROM RIGHT   eval   Shoulder extension 75  Shoulder flexion 168  Shoulder abduction 168  Shoulder internal rotation   Shoulder external rotation 98    (Blank rows = not tested)  A/PROM LEFT   eval  Shoulder extension 45pn  Shoulder flexion 113pn  Shoulder abduction 104 pn  Shoulder internal rotation   Shoulder external rotation 80pn    (Blank rows = not tested)  CERVICAL AROM: All within normal limits:   UPPER EXTREMITY STRENGTH: limited by pain  L-DEX LYMPHEDEMA SCREENING: The patient was assessed using the L-Dex machine today to produce a lymphedema index baseline score. The patient will be reassessed on a regular basis (typically every 3 months) to obtain new L-Dex scores. If the score is > 6.5 points away from his/her baseline score indicating onset of subclinical lymphedema, it will be recommended to wear a compression garment for 4 weeks, 12 hours per day and then be reassessed. If the score continues to be > 6.5 points from baseline at reassessment, we will initiate lymphedema treatment. Assessing in this manner has a 95% rate of preventing clinically significant lymphedema.  QUICK DASH SURVEY: 65% limited   BREAST COMPLAINTS QUESTIONNAIRE    11/06/23 Pain:   9 Heaviness:  7 Swollen feeling: 6 Tense Skin:  7 Redness:  3 Bra Print:  9 Size of Pores:  0 Hard feeling:   6 Total:   47  /80 A Score over 9 indicates lymphedema issues in the breast                                                                                                                             TREATMENT DATE:  11/06/23 Eval performed Manual Therapy  Supine MLD with education on POC and findings throughout. In supine: Short neck, superficial and deep abdominals, 5 diaphragmatic breaths, bil axillary nodes and establishment of interaxillary pathway, L inguinal nodes and establishment of axilloinguinal pathway, then L breast moving fluid towards pathways spending extra time in any areas of fibrosis then retracing all steps. PROM into flexion and abduction with vcs for relaxation of arm and shoulder Therapeutic Exercise  Then education on HEP with performance of each Supine dowel chest press x 10 Supine dowel flexion AAROM x 5 Standing dowel abduction x 5   PATIENT EDUCATION:  Education details: per today's note Person educated: Patient Education method: Programmer, multimedia, Demonstration, Actor cues, Verbal cues, and Handouts Education comprehension: verbalized understanding and returned demonstration  HOME EXERCISE PROGRAM: Access Code: [email or web address] URL: [email or web address] Date: 11/06/2023 Prepared by: Judy Null  Exercises - Supine Shoulder Flexion Extension AAROM with Dowel  - 1-2 x daily - 7 x weekly - 10 reps - 5 seconds hold - Supine Shoulder Press AAROM in Abduction with Dowel  - 1 x daily - 7 x weekly - 1-3 sets -  10 reps - 5 seconds hold - Standing Shoulder Abduction ROM with Dowel  - 1 x daily - 7 x weekly - 1-3 sets - 10 reps - 5 seconds hold  ASSESSMENT:  CLINICAL IMPRESSION: Patient is a 48 y.o. female who was seen today for physical therapy evaluation and treatment for left breast pain, limited arm motion, and left breast lymphedema 1 year after lumpectomy and around 8 months after radiation. Pt started to notice some left breast heaviness and more of a pulling feel with moving the arm overhead.  She also has continued nipple discharge which has been cleared by US , biopsy, and mammogram. . She has a compression bra but reports she may  need to get a new one due to gaining some weight.  She will [facility] at [employer].  Her SOZO was green showing no UE lymphedema.  Her PROM is limited more by pain vs tightness.  Started breast MLD and AAROM to decrease pain.    OBJECTIVE IMPAIRMENTS: decreased knowledge of condition, decreased knowledge of use of DME, decreased ROM, increased edema, and pain.   ACTIVITY LIMITATIONS: carrying, lifting, and reach over head  PARTICIPATION LIMITATIONS: cleaning, community activity, occupation, and yard work  PERSONAL FACTORS: 1 comorbidity: SLNB, radiation  are also affecting patient's functional outcome.   REHAB POTENTIAL: Good  CLINICAL DECISION MAKING: Stable/uncomplicated  EVALUATION COMPLEXITY: Low  GOALS: Goals reviewed with patient? Yes  SHORT TERM GOALS +LTGs: Target date: 12/04/23  Pt will improve left shoulder AROM to at least 162deg of the right arm to demonstrate improved reach  Baseline: Goal status: INITIAL  2.  Pt will be ind with self MLD for the Left breast  Baseline:  Goal status: INITIAL  3.  Pt will decrease resting breast pain to 2/10 or less Baseline: 8/10 Goal status: INITIAL  4. Pt will decrease breast complaints questionnaire to 27/80 or less  Baseline: 47   PLAN:  PT FREQUENCY: 2x/week  PT DURATION: 4 weeks  PLANNED INTERVENTIONS: 97110-Therapeutic exercises, 97530- Therapeutic activity, 97112- Neuromuscular re-education, 97140- Manual therapy, Patient/Family education, Balance training, Joint mobilization, Therapeutic exercises, Therapeutic activity, Neuromuscular re-education, Gait training, and Self Care  PLAN FOR NEXT SESSION: Lt breast MLD, PROM, try AAROM to get used to movement as able, pulleys   Leiland Mihelich, Durward Gillis, PT 11/06/2023, 3:01 PM

## 2023-11-07 ENCOUNTER — Other Ambulatory Visit

## 2023-11-09 ENCOUNTER — Ambulatory Visit: Admitting: Rehabilitation

## 2023-11-13 ENCOUNTER — Ambulatory Visit: Payer: 59 | Admitting: Hematology and Oncology

## 2023-11-13 ENCOUNTER — Telehealth: Payer: Self-pay | Admitting: Hematology and Oncology

## 2023-11-13 ENCOUNTER — Ambulatory Visit: Attending: Adult Health

## 2023-11-13 DIAGNOSIS — N644 Mastodynia: Secondary | ICD-10-CM | POA: Diagnosis not present

## 2023-11-13 DIAGNOSIS — Z9189 Other specified personal risk factors, not elsewhere classified: Secondary | ICD-10-CM | POA: Insufficient documentation

## 2023-11-13 DIAGNOSIS — Z170 Estrogen receptor positive status [ER+]: Secondary | ICD-10-CM | POA: Diagnosis not present

## 2023-11-13 DIAGNOSIS — R293 Abnormal posture: Secondary | ICD-10-CM | POA: Insufficient documentation

## 2023-11-13 DIAGNOSIS — M25622 Stiffness of left elbow, not elsewhere classified: Secondary | ICD-10-CM | POA: Diagnosis not present

## 2023-11-13 DIAGNOSIS — C50512 Malignant neoplasm of lower-outer quadrant of left female breast: Secondary | ICD-10-CM | POA: Insufficient documentation

## 2023-11-13 NOTE — Therapy (Signed)
 OUTPATIENT PHYSICAL THERAPY  UPPER EXTREMITY ONCOLOGY TREATMENT  Patient Name: MADELINN SPINLER MRN: [id number] DOB:08/10/1975, 48 y.o., female Today's Date: 11/13/2023  END OF SESSION:  PT End of Session - 11/13/23 1108     Visit Number 2    Number of Visits 9    Date for PT Re-Evaluation 12/04/23    Authorization Type not needed    PT Start Time 1103    PT Stop Time 1158    PT Time Calculation (min) 55 min    Activity Tolerance Patient tolerated treatment well    Behavior During Therapy WFL for tasks assessed/performed             Past Medical History:  Diagnosis Date   Anemia    history of   Breast cancer, left (HCC)    Dyspnea    even with speaking   Family history of adverse reaction to anesthesia    mother slow to awaken   Family history of breast cancer 05/10/2022   Family history of ovarian cancer 05/10/2022   Fibromyalgia    GERD (gastroesophageal reflux disease)    History of kidney stones    last stone 6 months ago    Migraine    Seasonal allergies    Tinnitus    Vertigo    Past Surgical History:  Procedure Laterality Date   ADENOIDECTOMY  1991   BREAST BIOPSY  11/06/2022   MM LT RADIOACTIVE SEED LOC MAMMO GUIDE 11/06/2022 [facility]   BREAST BIOPSY  11/06/2022   MM LT RADIOACTIVE SEED EA ADD LESION LOC MAMMO GUIDE 11/06/2022 [facility]   BREAST LUMPECTOMY WITH RADIOACTIVE SEED AND SENTINEL LYMPH NODE BIOPSY Left 11/07/2022   Procedure: LEFT BREAST LUMPECTOMY WITH RADIOACTIVE SEEDX2 AND SENTINEL LYMPH NODE BIOPSY;  Surgeon: Oza Blumenthal, MD;  Location: [facility];  Service: General;  Laterality: Left;   CYSTOSCOPY WITH RETROGRADE PYELOGRAM, URETEROSCOPY AND STENT PLACEMENT Left 02/22/2021   Procedure: CYSTOSCOPY WITH RETROGRADE PYELOGRAM, URETEROSCOPY AND STENT PLACEMENT;  Surgeon: Sherlyn Ditto, MD;  Location: [facility];  Service: Urology;  Laterality: Left;   CYSTOSCOPY/URETEROSCOPY/HOLMIUM LASER/STENT PLACEMENT Left 03/10/2021    Procedure: CYSTOSCOPY RETROGRADE, LEFT URETEROSCOPY/ STONE BASKETRY /HOLMIUM LASER/STENT EXCHANGE;  Surgeon: Andrez Banker, MD;  Location: [facility];  Service: Urology;  Laterality: Left;   ESOPHAGOGASTRODUODENOSCOPY N/A 11/17/2013   Procedure: ESOPHAGOGASTRODUODENOSCOPY (EGD);  Surgeon: Janel Medford, MD;  [facility]COPY;  Service: Endoscopy;  Laterality: N/A;   ESOPHAGOGASTRODUODENOSCOPY N/A 10/15/2015   Procedure: ESOPHAGOGASTRODUODENOSCOPY (EGD);  Surgeon: Kenney Peacemaker, MD;  Location: [facility] ENDOSCOPY;  Service: Endoscopy;  Laterality: N/A;   HOLMIUM LASER APPLICATION  03/10/2021   Procedure: HOLMIUM LASER APPLICATION;  Surgeon: Andrez Banker, MD;  Location: [facility];  Service: Urology;;   NASAL SEPTOPLASTY W/ TURBINOPLASTY Bilateral 03/18/2018   Procedure: BILATERAL NASAL SEPTOPLASTY WITH TURBINATE REDUCTION;  Surgeon: Lenton Rail, MD;  Location: [facility];  Service: ENT;  Laterality: Bilateral;   PORT-A-CATH REMOVAL Right 07/05/2023   Procedure: REMOVAL PORT-A-CATH;  Surgeon: Oza Blumenthal, MD;  Location: [facility];  Service: General;  Laterality: Right;   PORTACATH PLACEMENT Right 05/17/2022   Procedure: PORT-A-CATH INSERTION WITH ULTRASOUND GUIDANCE;  Surgeon: Oza Blumenthal, MD;  Location: [facility];  Service: General;  Laterality: Right;   TONSILLECTOMY  1991   TUBAL LIGATION  2005   Patient Active Problem List   Diagnosis Date Noted   Genetic testing 05/30/2022   Family history of breast cancer 05/10/2022  Family history of ovarian cancer 05/10/2022   Malignant neoplasm of upper-outer quadrant of left breast in female, estrogen receptor positive (HCC) 05/08/2022   Food impaction of esophagus    Schatzki's ring    Hx of migraine headaches 11/17/2013   Ureteral calculus 11/17/2013   GERD (gastroesophageal reflux disease) 11/17/2013   Esophageal obstruction due to food impaction 11/17/2013    PCP: Shawn  [name], DO  REFERRING PROVIDER: Alwin Baars, NP  REFERRING DIAG:  Diagnosis  C50.412,Z17.0 (ICD-10-CM) - Malignant neoplasm of upper-outer quadrant of left breast in female, estrogen receptor positive (HCC)  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 The breast tenderness is the same but I'm moving my shoulder more.  PERTINENT HISTORY: Completed neoadjuvant chemotherapy TCHP, then Left lumpectomy showing no residual carcinoma and 3 negative nodes 11/07/22. Completed radiation 01/16/23. On tamoxifen .  Imaging shows Left nipple enhancement and biopsy normal. No infection.   PAIN:  Are you having pain? Yes NPRS scale: 8/10 Pain location: left breast  Pain orientation: Left  PAIN TYPE: tenderness Pain description: constant  Aggravating factors: pulling into breast Relieving factors: stretches have been helping    PRECAUTIONS: lymphedema risk left   RED FLAGS: None   WEIGHT BEARING RESTRICTIONS: No  FALLS:  Has patient fallen in last 6 months? No  LIVING ENVIRONMENT: Lives with: fiance   OCCUPATION: desk job full time.  I can't type more than 5 min before I start to get pain in the armpit.    LEISURE: needs to pick up granchildren.  46-59 year old   HAND DOMINANCE: right   PRIOR LEVEL OF FUNCTION: Independent  PATIENT GOALS: decrease the pain    OBJECTIVE: Note: Objective measures were completed  at Evaluation unless otherwise noted.  COGNITION: Overall cognitive status: Within functional limits for tasks assessed   PALPATION: No cording noted, no significant tightness noted more so ttp and limited by pain.  +2 ttp to lateral breast at incision.  Mild breast edema noted / fibrosis  UPPER EXTREMITY AROM/PROM:  A/PROM RIGHT  eval   Shoulder extension 75  Shoulder flexion 168  Shoulder abduction 168  Shoulder internal rotation   Shoulder external rotation 98    (Blank rows = not tested)  A/PROM LEFT   eval  Shoulder extension 45pn  Shoulder flexion 113pn  Shoulder abduction 104 pn  Shoulder internal rotation   Shoulder external rotation 80pn    (Blank rows = not tested)  CERVICAL AROM: All within normal limits:   UPPER EXTREMITY STRENGTH: limited by pain  L-DEX LYMPHEDEMA SCREENING: The patient was assessed using the L-Dex machine today to produce a lymphedema index baseline score. The patient will be reassessed on a regular basis (typically every 3 months) to obtain new L-Dex scores. If the score is > 6.5 points away from his/her baseline score indicating onset of subclinical lymphedema, it will be recommended to wear a compression garment for 4 weeks, 12 hours per day and then be reassessed. If the score continues to be > 6.5 points from baseline at reassessment, we will initiate lymphedema treatment. Assessing in this manner has a 95% rate of preventing clinically significant lymphedema.  QUICK DASH SURVEY: 65% limited   BREAST COMPLAINTS QUESTIONNAIRE    11/06/23 Pain:   9 Heaviness:  7 Swollen feeling: 6 Tense Skin:  7 Redness:  3 Bra Print:  9 Size of Pores:  0 Hard feeling:   6 Total:   47  /80 A Score over 9 indicates lymphedema issues in the breast                                                                                                                            TREATMENT DATE:  11/13/23: Therapeutic Exercises Pulleys into flex and abd x 2:30  mins  Roll yellow ball up wall into flex (pt reports increased discomfort and bug stretch felt with this), and Lt UE abd x 5, this was more tolerable Manual Therapy Supine MLD in supine: Short neck, superficial and deep abdominals, Rt axillary and pectoral nodes and establishment of anterior interaxillary pathway, Lt inguinal nodes and establishment of Lt axilloinguinal pathway, then Lt breast moving fluid towards pathways spending extra time in any areas of fibrosis then retracing all steps. PROM into flexion, abd and D2 with scapular depression throughout by therapist STM to Lt shoulder and lateral trunk, pt with excellent tolerance for this today  11/06/23 Eval performed Manual Therapy  Supine MLD with education on POC and findings throughout. In supine: Short neck, superficial and deep abdominals, 5 diaphragmatic breaths, bil axillary nodes and establishment of interaxillary pathway, L inguinal nodes and establishment of axilloinguinal pathway, then L breast moving fluid towards pathways spending extra time in any areas of fibrosis then retracing all steps. PROM into flexion and abduction with vcs for relaxation of arm and shoulder Therapeutic Exercise  Then education on HEP with performance of each Supine dowel chest press x 10 Supine dowel flexion AAROM x 5 Standing dowel abduction x 5  PATIENT EDUCATION:  Education details: per today's note Person educated: Patient Education method: Explanation, Demonstration, Actor cues, Verbal cues, and Handouts Education comprehension: verbalized understanding and returned demonstration  HOME EXERCISE PROGRAM: Access Code: [email or web address] URL: [email or web address] Date: 11/06/2023 Prepared by: Judy Null  Exercises - Supine Shoulder Flexion Extension AAROM with Dowel  - 1-2 x daily - 7 x weekly - 10 reps - 5 seconds hold - Supine Shoulder Press AAROM in Abduction with Dowel  - 1 x daily - 7 x weekly - 1-3 sets - 10 reps - 5 seconds  hold - Standing Shoulder Abduction ROM with Dowel  - 1 x daily - 7 x weekly - 1-3 sets - 10 reps - 5 seconds hold  ASSESSMENT:  CLINICAL IMPRESSION: Began AA/ROM stretches today. Pt tolerated pulleys fairly well, but reported very big stretch felt with flex with ball roll. She was able to tolerate stretches though. Then continued with MT. Her tolerance for pressure was normal today with no reports of increased discomfort and her P/ROM improved well by end of session. Pt reports tingling in UE much improved after session.    OBJECTIVE IMPAIRMENTS: decreased knowledge of condition, decreased knowledge of use of DME, decreased ROM, increased edema, and pain.   ACTIVITY LIMITATIONS: carrying, lifting, and reach over head  PARTICIPATION LIMITATIONS: cleaning, community activity, occupation, and yard work  PERSONAL FACTORS: 1 comorbidity: SLNB, radiation  are also affecting patient's functional outcome.   REHAB POTENTIAL: Good  CLINICAL DECISION MAKING: Stable/uncomplicated  EVALUATION COMPLEXITY: Low  GOALS: Goals reviewed with patient? Yes  SHORT TERM GOALS +LTGs: Target date: 12/04/23  Pt will improve left shoulder AROM to at least 162deg of the right arm to demonstrate improved reach  Baseline: Goal status: INITIAL  2.  Pt will be ind with self MLD for the Left breast  Baseline:  Goal status: INITIAL  3.  Pt will decrease resting breast pain to 2/10 or less Baseline: 8/10 Goal status: INITIAL  4. Pt will decrease breast complaints questionnaire to 27/80 or less  Baseline: 47   PLAN:  PT FREQUENCY: 2x/week  PT DURATION: 4 weeks  PLANNED INTERVENTIONS: 97110-Therapeutic exercises, 97530- Therapeutic activity, 97112- Neuromuscular re-education, 97140- Manual therapy, Patient/Family education, Balance training, Joint mobilization, Therapeutic exercises, Therapeutic activity, Neuromuscular re-education, Gait training, and Self Care  PLAN FOR NEXT SESSION: Lt breast MLD,  PROM, cont AAROM to get used to movement as able, pulleys   Denyce Flank, PTA 11/13/2023, 12:03 PM

## 2023-11-13 NOTE — Telephone Encounter (Signed)
 Spoke with patient confirming upcoming appointment

## 2023-11-15 ENCOUNTER — Ambulatory Visit

## 2023-11-20 ENCOUNTER — Ambulatory Visit: Admitting: Rehabilitation

## 2023-11-20 ENCOUNTER — Encounter: Payer: Self-pay | Admitting: Rehabilitation

## 2023-11-20 DIAGNOSIS — Z170 Estrogen receptor positive status [ER+]: Secondary | ICD-10-CM

## 2023-11-20 DIAGNOSIS — Z9189 Other specified personal risk factors, not elsewhere classified: Secondary | ICD-10-CM | POA: Diagnosis not present

## 2023-11-20 DIAGNOSIS — C50512 Malignant neoplasm of lower-outer quadrant of left female breast: Secondary | ICD-10-CM | POA: Diagnosis not present

## 2023-11-20 DIAGNOSIS — M25622 Stiffness of left elbow, not elsewhere classified: Secondary | ICD-10-CM | POA: Diagnosis not present

## 2023-11-20 DIAGNOSIS — N644 Mastodynia: Secondary | ICD-10-CM

## 2023-11-20 DIAGNOSIS — R293 Abnormal posture: Secondary | ICD-10-CM | POA: Diagnosis not present

## 2023-11-20 NOTE — Therapy (Signed)
 OUTPATIENT PHYSICAL THERAPY  UPPER EXTREMITY ONCOLOGY TREATMENT  Patient Name: [name] [name] MRN: [id number] DOB:1975/10/05, 48 y.o., female Today's Date: 11/20/2023  END OF SESSION:  PT End of Session - 11/20/23 1150     Visit Number 3    Number of Visits 9    Date for PT Re-Evaluation 12/04/23             Past Medical History:  Diagnosis Date   Anemia    history of   Breast cancer, left (HCC)    Dyspnea    even with speaking   Family history of adverse reaction to anesthesia    mother slow to awaken   Family history of breast cancer 05/10/2022   Family history of ovarian cancer 05/10/2022   Fibromyalgia    GERD (gastroesophageal reflux disease)    History of kidney stones    last stone 6 months ago    Migraine    Seasonal allergies    Tinnitus    Vertigo    Past Surgical History:  Procedure Laterality Date   ADENOIDECTOMY  1991   BREAST BIOPSY  11/06/2022   MM LT RADIOACTIVE SEED LOC MAMMO GUIDE 11/06/2022 [facility]   BREAST BIOPSY  11/06/2022   MM LT RADIOACTIVE SEED EA ADD LESION LOC MAMMO GUIDE 11/06/2022 [facility]   BREAST LUMPECTOMY WITH RADIOACTIVE SEED AND SENTINEL LYMPH NODE BIOPSY Left 11/07/2022   Procedure: LEFT BREAST LUMPECTOMY WITH RADIOACTIVE SEEDX2 AND SENTINEL LYMPH NODE BIOPSY;  Surgeon: Oza Blumenthal, MD;  Location: [facility];  Service: General;  Laterality: Left;   CYSTOSCOPY WITH RETROGRADE PYELOGRAM, URETEROSCOPY AND STENT PLACEMENT Left 02/22/2021   Procedure: CYSTOSCOPY WITH RETROGRADE PYELOGRAM, URETEROSCOPY AND STENT PLACEMENT;  Surgeon: Sherlyn Ditto, MD;  Location: [facility];  Service: Urology;  Laterality: Left;   CYSTOSCOPY/URETEROSCOPY/HOLMIUM LASER/STENT PLACEMENT Left 03/10/2021   Procedure: CYSTOSCOPY RETROGRADE, LEFT URETEROSCOPY/ STONE BASKETRY /HOLMIUM LASER/STENT EXCHANGE;  Surgeon: Andrez Banker, MD;  Location: [facility];  Service: Urology;  Laterality: Left;    ESOPHAGOGASTRODUODENOSCOPY N/A 11/17/2013   Procedure: ESOPHAGOGASTRODUODENOSCOPY (EGD);  Surgeon: Janel Medford, MD;  Location: [facility];  Service: Endoscopy;  Laterality: N/A;   ESOPHAGOGASTRODUODENOSCOPY N/A 10/15/2015   Procedure: ESOPHAGOGASTRODUODENOSCOPY (EGD);  Surgeon: Kenney Peacemaker, MD;  Location[facility];  Service: Endoscopy;  Laterality: N/A;   HOLMIUM LASER APPLICATION  03/10/2021   Procedure: HOLMIUM LASER APPLICATION;  Surgeon: Andrez Banker, MD;  Location: [facility];  Service: Urology;;   NASAL SEPTOPLASTY W/ TURBINOPLASTY Bilateral 03/18/2018   Procedure: BILATERAL NASAL SEPTOPLASTY WITH TURBINATE REDUCTION;  Surgeon: Lenton Rail, MD;  Location: [facility];  Service: ENT;  Laterality: Bilateral;   PORT-A-CATH REMOVAL Right 07/05/2023   Procedure: REMOVAL PORT-A-CATH;  Surgeon: Oza Blumenthal, MD;  Location: [facility];  Service: General;  Laterality: Right;   PORTACATH PLACEMENT Right 05/17/2022   Procedure: PORT-A-CATH INSERTION WITH ULTRASOUND GUIDANCE;  Surgeon: Oza Blumenthal, MD;  Location: [facility];  Service: General;  Laterality: Right;   TONSILLECTOMY  1991   TUBAL LIGATION  2005   Patient Active Problem List   Diagnosis Date Noted   Genetic testing 05/30/2022   Family history of breast cancer 05/10/2022   Family history of ovarian cancer 05/10/2022   Malignant neoplasm of upper-outer quadrant of left breast in female, estrogen receptor positive (HCC) 05/08/2022   Food impaction of esophagus    Schatzki's ring    Hx of migraine headaches 11/17/2013   Ureteral calculus 11/17/2013  GERD (gastroesophageal reflux disease) 11/17/2013   Esophageal obstruction due to food impaction 11/17/2013    PCP: Shawn [name], DO  REFERRING PROVIDER: Alwin Baars, NP  REFERRING DIAG:  Diagnosis  C50.412,Z17.0 (ICD-10-CM) - Malignant neoplasm of upper-outer quadrant of left breast in female, estrogen receptor positive (HCC)   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    PERTINENT HISTORY: Completed neoadjuvant chemotherapy T[facility]P, then Left lumpectomy showing no residual carcinoma and 3 negative nodes 11/07/22. Completed radiation 01/16/23. On tamoxifen .  Imaging shows Left nipple enhancement and biopsy normal. No infection.   PAIN:  Are you having pain? Yes NPRS scale: 7/10 Pain location: left breast  Pain orientation: Left  PAIN TYPE: tenderness Pain description: constant  Aggravating factors: pulling into breast and touching Relieving factors: stretches have been helping    PRECAUTIONS: lymphedema risk left   RED FLAGS: None   WEIGHT BEARING RESTRICTIONS: No  FALLS:  Has patient fallen in last 6 months? No  LIVING ENVIRONMENT: Lives with: fiance   OCCUPATION: desk job full time.  I can't type more than 5 min before I start to get pain in the armpit.    LEISURE: needs to pick up granchildren.  61-47 year old   HAND DOMINANCE: right   PRIOR LEVEL OF FUNCTION: Independent  PATIENT GOALS: decrease the pain    OBJECTIVE: Note: Objective measures were completed at Evaluation unless otherwise noted.  COGNITION: Overall cognitive status: Within functional limits for tasks assessed   PALPATION: No cording noted, no significant tightness noted more so ttp and limited by pain.  +2 ttp to lateral breast at incision.  Mild breast edema noted /  fibrosis  UPPER EXTREMITY AROM/PROM:  A/PROM RIGHT   eval   Shoulder extension 75  Shoulder flexion 168  Shoulder abduction 168  Shoulder internal rotation   Shoulder external rotation 98    (Blank rows = not tested)  A/PROM LEFT   eval 11/20/23  Shoulder extension 45pn 60  Shoulder flexion 113pn 145 - pull in breast   Shoulder abduction 104 pn 130 -  pull in breast  Shoulder internal rotation    Shoulder external rotation 80pn 95 - pull    (Blank rows = not tested)  CERVICAL AROM: All within normal limits:   UPPER EXTREMITY STRENGTH: limited by pain  L-DEX LYMPHEDEMA SCREENING: The patient was assessed using the L-Dex machine today to produce a lymphedema index baseline score. The patient will be reassessed on a regular basis (typically every 3 months) to obtain new L-Dex scores. If the score is > 6.5 points away from his/her baseline score indicating onset of subclinical lymphedema, it will be recommended to wear a compression garment for 4 weeks, 12 hours per day and then be reassessed. If the score continues to be > 6.5 points from baseline at reassessment, we will initiate lymphedema treatment. Assessing in this manner has a 95% rate of preventing clinically significant lymphedema.  QUICK DASH SURVEY: 65% limited   BREAST COMPLAINTS QUESTIONNAIRE    11/06/23 Pain:   9 Heaviness:  7 Swollen feeling: 6 Tense Skin:  7 Redness:  3 Bra Print:  9 Size of Pores:  0 Hard feeling:   6 Total:   47  /80 A Score over 9 indicates lymphedema issues in the breast                                                                                                                            TREATMENT DATE:  11/20/23: Therapeutic Exercises Pulleys into flex and abd x 2:30 mins  Wall ball flexion x 5 and abduction x 5 - able to do this today without issues.  Manual Therapy Supine MLD in supine: Short neck, superficial and deep abdominals, Rt axillary and pectoral nodes and establishment  of anterior interaxillary pathway, Lt inguinal nodes and establishment of Lt axilloinguinal pathway, then Lt breast moving fluid towards pathways spending extra time in any areas of fibrosis then retracing all steps. PROM into flexion, abd and D2 with scapular depression throughout by therapist STM to Lt shoulder and lateral trunk, pt with excellent tolerance for this today  11/13/23: Therapeutic Exercises Pulleys into flex and abd x 2:30 mins  Roll yellow ball up wall into flex (pt reports increased discomfort and bug stretch felt with this), and Lt UE abd x 5, this was more tolerable Manual Therapy Supine MLD in supine: Short neck, superficial and deep abdominals, Rt axillary and pectoral nodes and establishment of anterior interaxillary pathway, Lt inguinal nodes and establishment of Lt axilloinguinal pathway, then Lt breast moving fluid towards pathways spending extra time in any areas of fibrosis then retracing all steps. PROM into flexion, abd and D2 with scapular depression throughout by therapist STM to Lt shoulder and lateral trunk, pt with excellent tolerance for this today  11/06/23 Eval performed Manual Therapy  Supine MLD with education on POC and findings throughout. In supine: Short neck, superficial and deep abdominals, 5 diaphragmatic breaths, bil axillary nodes and establishment of interaxillary pathway, L inguinal nodes and establishment of  axilloinguinal pathway, then L breast moving fluid towards pathways spending extra time in any areas of fibrosis then retracing all steps. PROM into flexion and abduction with vcs for relaxation of arm and shoulder Therapeutic Exercise  Then education on HEP with performance of each Supine dowel chest press x 10 Supine dowel flexion AAROM x 5 Standing dowel abduction x 5   PATIENT EDUCATION:  Education details: per today's note Person educated: Patient Education method: Programmer, multimedia, Demonstration, Actor cues, Verbal cues, and  Handouts Education comprehension: verbalized understanding and returned demonstration  HOME EXERCISE PROGRAM: Access Code: [email or web address] URL: [email or web address] Date: 11/06/2023 Prepared by: Judy Null  Exercises - Supine Shoulder Flexion Extension AAROM with Dowel  - 1-2 x daily - 7 x weekly - 10 reps - 5 seconds hold - Supine Shoulder Press AAROM in Abduction with Dowel  - 1 x daily - 7 x weekly - 1-3 sets - 10 reps - 5 seconds hold - Standing Shoulder Abduction ROM with Dowel  - 1 x daily - 7 x weekly - 1-3 sets - 10 reps - 5 seconds hold  ASSESSMENT:  CLINICAL IMPRESSION: Pt is improving greatly.  Her AROM is improving and her pain level is decreasing.  She is also noting less discharge from the nipple.   OBJECTIVE IMPAIRMENTS: decreased knowledge of condition, decreased knowledge of use of DME, decreased ROM, increased edema, and pain.   ACTIVITY LIMITATIONS: carrying, lifting, and reach over head  PARTICIPATION LIMITATIONS: cleaning, community activity, occupation, and yard work  PERSONAL FACTORS: 1 comorbidity: SLNB, radiation are also affecting patient's functional outcome.   REHAB POTENTIAL: Good  CLINICAL DECISION MAKING: Stable/uncomplicated  EVALUATION COMPLEXITY: Low  GOALS: Goals reviewed with patient? Yes  SHORT TERM GOALS +LTGs: Target date: 12/04/23  Pt will improve left shoulder AROM to at least 162deg of the right arm to demonstrate improved reach  Baseline: Goal status: INITIAL  2.  Pt will be ind with self MLD for the Left breast  Baseline:  Goal status: INITIAL  3.  Pt will decrease resting breast pain to 2/10 or less Baseline: 8/10 Goal status: INITIAL  4. Pt will decrease breast complaints questionnaire to 27/80 or less  Baseline: 47   PLAN:  PT FREQUENCY: 2x/week  PT DURATION: 4 weeks  PLANNED INTERVENTIONS: 97110-Therapeutic exercises, 97530- Therapeutic activity, 97112- Neuromuscular re-education, 97140- Manual therapy,  Patient/Family education, Balance training, Joint mobilization, Therapeutic exercises, Therapeutic activity, Neuromuscular re-education, Gait training, and Self Care  PLAN FOR NEXT SESSION: Lt breast MLD, PROM, cont AAROM to get used to movement as able, pulleys   Encarnacion Harris, PT 11/20/2023, 11:54 AM

## 2023-11-22 ENCOUNTER — Ambulatory Visit: Admitting: Rehabilitation

## 2023-11-27 ENCOUNTER — Ambulatory Visit

## 2023-11-29 ENCOUNTER — Ambulatory Visit: Admitting: Rehabilitation

## 2023-11-29 ENCOUNTER — Telehealth: Payer: Self-pay

## 2023-11-30 ENCOUNTER — Inpatient Hospital Stay

## 2023-11-30 ENCOUNTER — Inpatient Hospital Stay: Attending: Hematology and Oncology | Admitting: Hematology and Oncology

## 2023-11-30 ENCOUNTER — Encounter: Payer: Self-pay | Admitting: Hematology and Oncology

## 2023-11-30 VITALS — BP 104/63 | HR 80 | Temp 98.4°F | Resp 16 | Wt 200.8 lb

## 2023-11-30 DIAGNOSIS — C50412 Malignant neoplasm of upper-outer quadrant of left female breast: Secondary | ICD-10-CM

## 2023-11-30 DIAGNOSIS — E538 Deficiency of other specified B group vitamins: Secondary | ICD-10-CM

## 2023-11-30 DIAGNOSIS — Z923 Personal history of irradiation: Secondary | ICD-10-CM | POA: Insufficient documentation

## 2023-11-30 DIAGNOSIS — N6452 Nipple discharge: Secondary | ICD-10-CM | POA: Insufficient documentation

## 2023-11-30 DIAGNOSIS — Z800 Family history of malignant neoplasm of digestive organs: Secondary | ICD-10-CM | POA: Diagnosis not present

## 2023-11-30 DIAGNOSIS — D508 Other iron deficiency anemias: Secondary | ICD-10-CM

## 2023-11-30 DIAGNOSIS — Z9221 Personal history of antineoplastic chemotherapy: Secondary | ICD-10-CM | POA: Insufficient documentation

## 2023-11-30 DIAGNOSIS — Z1721 Progesterone receptor positive status: Secondary | ICD-10-CM | POA: Diagnosis not present

## 2023-11-30 DIAGNOSIS — N644 Mastodynia: Secondary | ICD-10-CM | POA: Diagnosis not present

## 2023-11-30 DIAGNOSIS — I890 Lymphedema, not elsewhere classified: Secondary | ICD-10-CM | POA: Diagnosis not present

## 2023-11-30 DIAGNOSIS — Z1731 Human epidermal growth factor receptor 2 positive status: Secondary | ICD-10-CM | POA: Insufficient documentation

## 2023-11-30 DIAGNOSIS — D509 Iron deficiency anemia, unspecified: Secondary | ICD-10-CM | POA: Diagnosis not present

## 2023-11-30 DIAGNOSIS — Z79810 Long term (current) use of selective estrogen receptor modulators (SERMs): Secondary | ICD-10-CM | POA: Diagnosis not present

## 2023-11-30 DIAGNOSIS — Z8041 Family history of malignant neoplasm of ovary: Secondary | ICD-10-CM | POA: Diagnosis not present

## 2023-11-30 DIAGNOSIS — Z170 Estrogen receptor positive status [ER+]: Secondary | ICD-10-CM | POA: Insufficient documentation

## 2023-11-30 DIAGNOSIS — Z803 Family history of malignant neoplasm of breast: Secondary | ICD-10-CM | POA: Insufficient documentation

## 2023-11-30 LAB — CBC WITH DIFFERENTIAL (CANCER CENTER ONLY)
Abs Immature Granulocytes: 0.01 10*3/uL (ref 0.00–0.07)
Basophils Absolute: 0 10*3/uL (ref 0.0–0.1)
Basophils Relative: 0 %
Eosinophils Absolute: 0.1 10*3/uL (ref 0.0–0.5)
Eosinophils Relative: 4 %
HCT: 33.7 % — ABNORMAL LOW (ref 36.0–46.0)
Hemoglobin: 11.8 g/dL — ABNORMAL LOW (ref 12.0–15.0)
Immature Granulocytes: 0 %
Lymphocytes Relative: 27 %
Lymphs Abs: 0.7 10*3/uL (ref 0.7–4.0)
MCH: 30.8 pg (ref 26.0–34.0)
MCHC: 35 g/dL (ref 30.0–36.0)
MCV: 88 fL (ref 80.0–100.0)
Monocytes Absolute: 0.2 10*3/uL (ref 0.1–1.0)
Monocytes Relative: 7 %
Neutro Abs: 1.7 10*3/uL (ref 1.7–7.7)
Neutrophils Relative %: 62 %
Platelet Count: 133 10*3/uL — ABNORMAL LOW (ref 150–400)
RBC: 3.83 MIL/uL — ABNORMAL LOW (ref 3.87–5.11)
RDW: 13.9 % (ref 11.5–15.5)
WBC Count: 2.7 10*3/uL — ABNORMAL LOW (ref 4.0–10.5)
nRBC: 0 % (ref 0.0–0.2)

## 2023-11-30 LAB — CMP (CANCER CENTER ONLY)
ALT: 9 U/L (ref 0–44)
AST: 11 U/L — ABNORMAL LOW (ref 15–41)
Albumin: 3.7 g/dL (ref 3.5–5.0)
Alkaline Phosphatase: 47 U/L (ref 38–126)
Anion gap: 4 — ABNORMAL LOW (ref 5–15)
BUN: 16 mg/dL (ref 6–20)
CO2: 28 mmol/L (ref 22–32)
Calcium: 8.6 mg/dL — ABNORMAL LOW (ref 8.9–10.3)
Chloride: 107 mmol/L (ref 98–111)
Creatinine: 0.97 mg/dL (ref 0.44–1.00)
GFR, Estimated: >60 mL/min (ref >60)
Glucose, Bld: 104 mg/dL — ABNORMAL HIGH (ref 70–99)
Potassium: 3.6 mmol/L (ref 3.5–5.1)
Sodium: 139 mmol/L (ref 135–145)
Total Bilirubin: 0.5 mg/dL (ref 0.0–1.2)
Total Protein: 6.4 g/dL — ABNORMAL LOW (ref 6.5–8.1)

## 2023-11-30 LAB — RETIC PANEL
Immature Retic Fract: 13.4 % (ref 2.3–15.9)
RBC.: 3.83 MIL/uL — ABNORMAL LOW (ref 3.87–5.11)
Retic Count, Absolute: 79.7 10*3/uL (ref 19.0–186.0)
Retic Ct Pct: 2.1 % (ref 0.4–3.1)
Reticulocyte Hemoglobin: 37 pg (ref >27.9)

## 2023-11-30 LAB — IRON AND IRON BINDING CAPACITY (CC-WL,HP ONLY)
Iron: 115 ug/dL (ref 28–170)
Saturation Ratios: 32 % — ABNORMAL HIGH (ref 10.4–31.8)
TIBC: 358 ug/dL (ref 250–450)
UIBC: 243 ug/dL (ref 148–442)

## 2023-11-30 LAB — TSH: TSH: 3.15 u[IU]/mL (ref 0.350–4.500)

## 2023-11-30 LAB — FERRITIN: Ferritin: 22 ng/mL (ref 11–307)

## 2023-11-30 LAB — VITAMIN B12: Vitamin B-12: 6070 pg/mL — ABNORMAL HIGH (ref 180–914)

## 2023-11-30 NOTE — Progress Notes (Signed)
 [facility] Cancer Follow up:    [name], [name], DO [address] [place] [address]   DIAGNOSIS:  Cancer Staging  Malignant neoplasm of upper-outer quadrant of left breast in female, estrogen receptor positive (HCC) Staging form: Breast, AJCC 8th Edition - Clinical stage from 05/08/2022: Stage IB (cT2, cN0, cM0, G2, ER+, PR+, HER2+) - Signed by Bettejane Brownie, PA-C on 05/08/2022 Stage prefix: Initial diagnosis Method of lymph node assessment: Clinical Histologic grading system: 3 grade system - Pathologic stage from 11/07/2022: No Stage Recommended (ypT0, pN0, cM0) - Signed by Percival Brace, NP on 11/28/2022 Stage prefix: Post-therapy    SUMMARY OF ONCOLOGIC HISTORY: Oncology History  Malignant neoplasm of upper-outer quadrant of left breast in female, estrogen receptor positive (HCC)  04/25/2022 Imaging   Patient had diagnostic bilateral mammogram given palpable mass in the left breast.  This showed a 2.6 x 3.5 x 3 cm irregular mass as well as possible satellite mass which is indeterminate.  An ultrasound was recommended.  Ultrasound of the left breast mass showed 3.9 x 2.4 x 2.3 cm irregular mass with an irregular and spiculated margin at 3:00 middle depth 5 cm from the nipple, irregular mass is hypoechoic.  In the left breast at 1 o'clock position, 3 cm from the nipple there is a round mass measuring 0.3 x 0.2 x 0.3 cm.  No significant abnormalities were seen sonographically in the left axilla.  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  Left breast needle core biopsy at 1:00 3 cm from the nipple showed fibrocystic changes with usual ductal hyperplasia negative for malignancy.  Prognostic showed ER 95% PR  40%, HER2 3+ KI of 50%   05/08/2022 Cancer Staging   Staging form: Breast, AJCC 8th Edition - Clinical stage from 05/08/2022: Stage IB (cT2, cN0, cM0, G2, ER+, PR+, HER2+) - Signed by Bettejane Brownie, PA-C on 05/08/2022 Stage prefix: Initial diagnosis Method of lymph node assessment: Clinical Histologic grading system: 3 grade system   05/18/2022 - 09/16/2022 Chemotherapy   Patient is on Treatment Plan : BREAST  Docetaxel  + Carboplatin  + Trastuzumab  + Pertuzumab   (TCHP) q21d      05/23/2022 Genetic Testing   Negative genetics for [facility] CustomNext-Cancer +RNAinsight Panel.  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 Report date is 05/23/2022.   The CustomNext-Cancer+RNAinsight panel offered by [facility] includes sequencing and rearrangement analysis for the following 47 genes:  APC, ATM, AXIN2, BARD1, BMPR1A, BRCA1, BRCA2, BRIP1, CDH1, CDK4, CDKN2A, CHEK2, DICER1, EPCAM, GREM1, HOXB13, MEN1, MLH1, MSH2, MSH3, MSH6, MUTYH, NBN, NF1, NF2, NTHL1, PALB2, PMS2, POLD1, POLE, PTEN, RAD51C, RAD51D, RECQL, RET, SDHA, SDHAF2, SDHB, SDHC, SDHD, SMAD4, SMARCA4, STK11, TP53, TSC1, TSC2, and VHL.  RNA data is routinely analyzed for use in variant interpretation for all genes.   10/17/2022 - 06/05/2023 Chemotherapy   Patient is on Treatment Plan : BREAST Trastuzumab  + Pertuzumab  q21d x 11 cycles     11/07/2022 Surgery   Left breast lumpectomy: No residual invasive carcinoma or DCIS.  3 SLN negative for cancer, ypT0,pN0   11/07/2022 Cancer Staging   Staging form: Breast, AJCC 8th Edition - Pathologic stage from 11/07/2022: No Stage Recommended (ypT0, pN0, cM0) - Signed by Percival Brace, NP  on 11/28/2022 Stage prefix: Post-therapy   12/18/2022 - 01/16/2023 Radiation Therapy   Plan Name: Breast_L_BH Site: Breast, Left Technique: 3D Mode: Photon Dose Per Fraction: 2.66 Gy Prescribed Dose (Delivered / Prescribed): 42.56 Gy / 42.56 Gy Prescribed Fxs (Delivered / Prescribed): 16 / 16   Plan Name:  Brst_L_Bst_BH Site: Breast, Left Technique: 3D Mode: Photon Dose Per Fraction: 2 Gy Prescribed Dose (Delivered / Prescribed): 8 Gy / 8 Gy Prescribed Fxs (Delivered / Prescribed): 4 / 4     02/2023 -  Anti-estrogen oral therapy   Tamoxifen  daily     CURRENT THERAPY: Tamoxifen   INTERVAL HISTORY:  Discussed the use of AI scribe software for clinical note transcription with the patient, who gave verbal consent to proceed.  History of Present Illness [name] [name] is a 48 year old female with a history of left breast cancer who presents with nipple discharge and lymphedema.  She experiences persistent pain in her left breast along with a small amount of nipple discharge, which is typically dried around the nipple upon waking. Previous evaluations, including a biopsy, did not reveal any concerning findings. She has been undergoing physical therapy, which has improved her mobility, although the breast pain persists.  She has a history of left breast cancer diagnosed in October 2023 and completed treatment, including starting tamoxifen  in August 2024. She continues tamoxifen  therapy as part of her post-treatment management.  She reports daily swelling in her ankles and feet, which improves overnight but worsens throughout the day, necessitating the purchase of larger shoes. No changes in breathing, bowel habits, or urination are noted.  Her menstrual cycle has been irregular, with no periods for the past two months. She is currently taking iron supplements for iron deficiency and vitamin B12.  In terms of diet, she mentions eating breakfast, lunch, and dinner, with a focus on consuming a lot of fruit, although she does not eat bananas.  Patient Active Problem List   Diagnosis Date Noted   Genetic testing 05/30/2022   Family history of breast cancer 05/10/2022   Family history of ovarian cancer 05/10/2022   Malignant neoplasm of upper-outer quadrant of left breast in female, estrogen  receptor positive (HCC) 05/08/2022   Food impaction of esophagus    Schatzki's ring    Hx of migraine headaches 11/17/2013   Ureteral calculus 11/17/2013   GERD (gastroesophageal reflux disease) 11/17/2013   Esophageal obstruction due to food impaction 11/17/2013    is allergic to amoxicillin, coconut (cocos nucifera), penicillins, duloxetine, hydrocodone , hydrocodone -acetaminophen , and moxifloxacin.  MEDICAL HISTORY: Past Medical History:  Diagnosis Date   Anemia    history of   Breast cancer, left (HCC)    Dyspnea    even with speaking   Family history of adverse reaction to anesthesia    mother slow to awaken   Family history of breast cancer 05/10/2022   Family history of ovarian cancer 05/10/2022   Fibromyalgia    GERD (gastroesophageal reflux disease)    History of kidney stones    last stone 6 months ago    Migraine    Seasonal allergies    Tinnitus    Vertigo     SURGICAL HISTORY: Past Surgical History:  Procedure Laterality Date   ADENOIDECTOMY  1991   BREAST BIOPSY  11/06/2022   MM LT RADIOACTIVE SEED LOC MAMMO GUIDE 11/06/2022 [facility]   BREAST BIOPSY  11/06/2022   MM LT RADIOACTIVE SEED EA ADD LESION LOC MAMMO GUIDE 11/06/2022 [facility]  BREAST LUMPECTOMY WITH RADIOACTIVE SEED AND SENTINEL LYMPH NODE BIOPSY Left 11/07/2022   Procedure: LEFT BREAST LUMPECTOMY WITH RADIOACTIVE SEEDX2 AND SENTINEL LYMPH NODE BIOPSY;  Surgeon: Oza Blumenthal, MD;  Location: [facility];  Service: General;  Laterality: Left;   CYSTOSCOPY WITH RETROGRADE PYELOGRAM, URETEROSCOPY AND STENT PLACEMENT Left 02/22/2021   Procedure: CYSTOSCOPY WITH RETROGRADE PYELOGRAM, URETEROSCOPY AND STENT PLACEMENT;  Surgeon: Sherlyn Ditto, MD;  Location: [facility];  Service: Urology;  Laterality: Left;   CYSTOSCOPY/URETEROSCOPY/HOLMIUM LASER/STENT PLACEMENT Left 03/10/2021   Procedure: CYSTOSCOPY RETROGRADE, LEFT URETEROSCOPY/ STONE BASKETRY /HOLMIUM LASER/STENT EXCHANGE;  Surgeon:  Andrez Banker, MD;  Location: [facility];  Service: Urology;  Laterality: Left;   ESOPHAGOGASTRODUODENOSCOPY N/A 11/17/2013   Procedure: ESOPHAGOGASTRODUODENOSCOPY (EGD);  Surgeon: Janel Medford, MD;  Location: [facility] ENDOSCOPY;  Service: Endoscopy;  Laterality: N/A;   ESOPHAGOGASTRODUODENOSCOPY N/A 10/15/2015   Procedure: ESOPHAGOGASTRODUODENOSCOPY (EGD);  Surgeon: Kenney Peacemaker, MD;  Location: [facility] ENDOSCOPY;  Service: Endoscopy;  Laterality: N/A;   HOLMIUM LASER APPLICATION  03/10/2021   Procedure: HOLMIUM LASER APPLICATION;  Surgeon: Andrez Banker, MD;  Location: [facility];  Service: Urology;;   NASAL SEPTOPLASTY W/ TURBINOPLASTY Bilateral 03/18/2018   Procedure: BILATERAL NASAL SEPTOPLASTY WITH TURBINATE REDUCTION;  Surgeon: Lenton Rail, MD;  Location: [facility];  Service: ENT;  Laterality: Bilateral;   PORT-A-CATH REMOVAL Right 07/05/2023   Procedure: REMOVAL PORT-A-CATH;  Surgeon: Oza Blumenthal, MD;  Location: [facility];  Service: General;  Laterality: Right;   PORTACATH PLACEMENT Right 05/17/2022   Procedure: PORT-A-CATH INSERTION WITH ULTRASOUND GUIDANCE;  Surgeon: Oza Blumenthal, MD;  Location: [facility];  Service: General;  Laterality: Right;   TONSILLECTOMY  1991   TUBAL LIGATION  2005    SOCIAL HISTORY: Social History   Socioeconomic History   Marital status: Single    Spouse name: Not on file   Number of children: Not on file   Years of education: Not on file   Highest education level: Not on file  Occupational History   Not on file  Tobacco Use   Smoking status: Never    Passive exposure: Past   Smokeless tobacco: Never  Vaping Use   Vaping status: Never Used  Substance and Sexual Activity   Alcohol use: No   Drug use: No   Sexual activity: Not on file  Other Topics Concern   Not on file  Social History Narrative   ** Merged History Encounter **       Social Drivers of Health   Financial Resource  Strain: Low Risk  (05/10/2022)   Overall Financial Resource Strain (CARDIA)    Difficulty of Paying Living Expenses: Not very hard  Food Insecurity: No Food Insecurity (12/05/2022)   Hunger Vital Sign    Worried About Running Out of Food in the Last Year: Never true    Ran Out of Food in the Last Year: Never true  Transportation Needs: No Transportation Needs (12/05/2022)   PRAPARE - Administrator, Civil Service (Medical): No    Lack of Transportation (Non-Medical): No  Physical Activity: Not on file  Stress: Not on file  Social Connections: Not on file  Intimate Partner Violence: Not At Risk (12/05/2022)   Humiliation, Afraid, Rape, and Kick questionnaire    Fear of Current or Ex-Partner: No    Emotionally Abused: No    Physically Abused: No    Sexually Abused: No    FAMILY HISTORY: Family History  Problem Relation  Age of Onset   Asthma Mother    Diabetes Mother    Colon polyps Mother    Ovarian cancer Maternal Grandmother 22   Diabetes Maternal Grandmother    Esophageal cancer Maternal Grandfather        d. 13   Breast cancer Other        MGM's sisters x2   Cervical cancer Cousin        mat cousin; dx <40    Review of Systems  Constitutional:  Negative for appetite change, chills, fatigue, fever and unexpected weight change.  HENT:   Negative for hearing loss, lump/mass and trouble swallowing.   Eyes:  Negative for eye problems and icterus.  Respiratory:  Negative for chest tightness, cough and shortness of breath.   Cardiovascular:  Negative for chest pain, leg swelling and palpitations.  Gastrointestinal:  Negative for abdominal distention, abdominal pain, constipation, diarrhea, nausea and vomiting.  Endocrine: Negative for hot flashes.  Genitourinary:  Negative for difficulty urinating.   Musculoskeletal:  Negative for arthralgias.  Skin:  Negative for itching and rash.  Neurological:  Negative for dizziness, extremity weakness, headaches and numbness.   Hematological:  Negative for adenopathy. Does not bruise/bleed easily.  Psychiatric/Behavioral:  Negative for depression. The patient is not nervous/anxious.       PHYSICAL EXAMINATION    Vitals:   11/30/23 0859  BP: 104/63  Pulse: 80  Resp: 16  Temp: 98.4 F (36.9 C)  SpO2: 99%    Physical Exam Constitutional:      General: She is not in acute distress.    Appearance: Normal appearance. She is not toxic-appearing.  HENT:     Head: Normocephalic and atraumatic.     Mouth/Throat:     Mouth: Mucous membranes are moist.     Pharynx: Oropharynx is clear. No oropharyngeal exudate or posterior oropharyngeal erythema.  Eyes:     General: No scleral icterus. Cardiovascular:     Rate and Rhythm: Normal rate and regular rhythm.     Pulses: Normal pulses.     Heart sounds: Normal heart sounds.  Pulmonary:     Effort: Pulmonary effort is normal.     Breath sounds: Normal breath sounds.  Chest:     Comments: Left nipple with no crusting. Post lumpectomy changes, scarring noted. No palpable masses. No regional adenopathy Right breast normal to inspection and palpation Abdominal:     General: Abdomen is flat. Bowel sounds are normal. There is no distension.     Palpations: Abdomen is soft.     Tenderness: There is no abdominal tenderness.  Musculoskeletal:        General: No swelling.     Cervical back: Neck supple.  Lymphadenopathy:     Cervical: No cervical adenopathy.     Upper Body:     Right upper body: No supraclavicular or axillary adenopathy.     Left upper body: No supraclavicular or axillary adenopathy.  Skin:    General: Skin is warm and dry.     Findings: No rash.  Neurological:     General: No focal deficit present.     Mental Status: She is alert.  Psychiatric:        Mood and Affect: Mood normal.        Behavior: Behavior normal.      ASSESSMENT and THERAPY PLAN:   Malignant neoplasm of upper-outer quadrant of left breast in female, estrogen  receptor positive (HCC) Assessment and Plan Assessment & Plan Left breast  cancer Recent MRI showed new enhancement without masses or adenopathy. Post-lumpectomy changes noted. Nipple discharge improved. - Continue tamoxifen  therapy. - Schedule annual mammogram for March 2026.  Lymphedema Physical therapy improved mobility, persistent breast pain.  Iron deficiency anemia Suboptimal levels in February. On iron supplements and vitamin B12. - Labs today. - Send blood work results via [employer].  FU in 6 months or sooner as needed.  All questions were answered. The patient knows to call the clinic with any problems, questions or concerns. We can certainly see the patient much sooner if necessary.  Total encounter time:30 minutes*in face-to-face visit time, chart review, lab review, care coordination, order entry, and documentation of the encounter time.   *Total Encounter Time as defined by the Centers for Medicare and Medicaid Services includes, in addition to the face-to-face time of a patient visit (documented in the note above) non-face-to-face time: obtaining and reviewing outside history, ordering and reviewing medications, tests or procedures, care coordination (communications with other health care professionals or caregivers) and documentation in the medical record.

## 2023-11-30 NOTE — Assessment & Plan Note (Addendum)
 Assessment and Plan Assessment & Plan Left breast cancer Recent MRI showed new enhancement without masses or adenopathy. Post-lumpectomy changes noted. Nipple discharge improved. - Continue tamoxifen  therapy. - Schedule annual mammogram for March 2026.  Lymphedema Physical therapy improved mobility, persistent breast pain.  Iron deficiency anemia Suboptimal levels in February. On iron supplements and vitamin B12. - Labs today. - Send blood work results via [employer].  FU in 6 months or sooner as needed.

## 2023-12-04 ENCOUNTER — Ambulatory Visit: Payer: Self-pay | Admitting: *Deleted

## 2023-12-04 ENCOUNTER — Ambulatory Visit: Admitting: Rehabilitation

## 2023-12-04 NOTE — Telephone Encounter (Signed)
 Called to see if pt would like to keep her remaining PT appointments due to cancelling the last 5.  No answer.

## 2023-12-06 ENCOUNTER — Ambulatory Visit: Admitting: Rehabilitation

## 2023-12-07 ENCOUNTER — Other Ambulatory Visit

## 2023-12-07 ENCOUNTER — Ambulatory Visit: Payer: 59 | Admitting: Hematology and Oncology

## 2023-12-17 ENCOUNTER — Encounter: Payer: Self-pay | Admitting: Adult Health

## 2023-12-17 ENCOUNTER — Inpatient Hospital Stay: Attending: Hematology and Oncology | Admitting: Adult Health

## 2023-12-17 DIAGNOSIS — E538 Deficiency of other specified B group vitamins: Secondary | ICD-10-CM

## 2023-12-17 DIAGNOSIS — Z923 Personal history of irradiation: Secondary | ICD-10-CM

## 2023-12-17 DIAGNOSIS — Z800 Family history of malignant neoplasm of digestive organs: Secondary | ICD-10-CM

## 2023-12-17 DIAGNOSIS — Z803 Family history of malignant neoplasm of breast: Secondary | ICD-10-CM

## 2023-12-17 DIAGNOSIS — Z1731 Human epidermal growth factor receptor 2 positive status: Secondary | ICD-10-CM | POA: Diagnosis not present

## 2023-12-17 DIAGNOSIS — Z1721 Progesterone receptor positive status: Secondary | ICD-10-CM

## 2023-12-17 DIAGNOSIS — Z8049 Family history of malignant neoplasm of other genital organs: Secondary | ICD-10-CM

## 2023-12-17 DIAGNOSIS — Z9221 Personal history of antineoplastic chemotherapy: Secondary | ICD-10-CM

## 2023-12-17 DIAGNOSIS — E611 Iron deficiency: Secondary | ICD-10-CM

## 2023-12-17 DIAGNOSIS — C50412 Malignant neoplasm of upper-outer quadrant of left female breast: Secondary | ICD-10-CM

## 2023-12-17 DIAGNOSIS — Z170 Estrogen receptor positive status [ER+]: Secondary | ICD-10-CM

## 2023-12-17 DIAGNOSIS — Z8041 Family history of malignant neoplasm of ovary: Secondary | ICD-10-CM

## 2023-12-17 DIAGNOSIS — Z79810 Long term (current) use of selective estrogen receptor modulators (SERMs): Secondary | ICD-10-CM

## 2023-12-17 NOTE — Assessment & Plan Note (Signed)
 Breast cancer.  Currently on tamoxifen  tolerating well - Continue on tamoxifen  and follow-up with Dr. Arno Bibles as scheduled  CBC abnormalities.  Her white blood cells, hemoglobin, and platelets have all been mildly decreased.  Her neutrophils have normalized at this lab check. - Questionably secondary to iron and B12 deficiency.  Hemoglobin almost normalized.  Will recheck in 3 months.  Iron deficiency Continues on iron supplements.  Ferritin improved -Continue iron supplementation -Recheck labs again in 3 months  Vitamin B12 supplementation - Her most recent level was 6070.  I recommended she change from 5000 mcg to 1000 mcg daily - Will recheck labs again in 3 months

## 2023-12-17 NOTE — Progress Notes (Signed)
 [facility] Cancer Follow up:    [name], [name], DO [address] [place] [address]   DIAGNOSIS:  Cancer Staging  Malignant neoplasm of upper-outer quadrant of left breast in female, estrogen receptor positive (HCC) Staging form: Breast, AJCC 8th Edition - Clinical stage from 05/08/2022: Stage IB (cT2, cN0, cM0, G2, ER+, PR+, HER2+) - Signed by Bettejane Brownie, PA-C on 05/08/2022 Stage prefix: Initial diagnosis Method of lymph node assessment: Clinical Histologic grading system: 3 grade system - Pathologic stage from 11/07/2022: No Stage Recommended (ypT0, pN0, cM0) - Signed by Percival Brace, NP on 11/28/2022 Stage prefix: Post-therapy  I connected with Vania Genin on 12/17/23 at 11:40 AM EDT by telephone and verified that I am speaking with the correct person using two identifiers.  I discussed the limitations, risks, security and privacy concerns of performing an evaluation and management service by telephone and the availability of in person appointments.  I also discussed with the patient that there may be a patient responsible charge related to this service. The patient expressed understanding and agreed to proceed.  Patient location: home Provider location: [facility] office  SUMMARY OF ONCOLOGIC HISTORY: Oncology History  Malignant neoplasm of upper-outer quadrant of left breast in female, estrogen receptor positive (HCC)  04/25/2022 Imaging   Patient had diagnostic bilateral mammogram given palpable mass in the left breast.  This showed a 2.6 x 3.5 x 3 cm irregular mass as well as possible satellite mass which is indeterminate.  An ultrasound was recommended.  Ultrasound of the left breast mass showed 3.9 x 2.4 x 2.3 cm irregular mass with an irregular and spiculated margin at 3:00 middle depth 5 cm from the nipple, irregular mass is hypoechoic.  In the left breast at 1 o'clock position, 3 cm from the nipple there is a round mass measuring 0.3  x 0.2 x 0.3 cm.  No significant abnormalities were seen sonographically in the left axilla.  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  Left breast needle core biopsy at 1:00 3 cm from the nipple showed fibrocystic changes with usual ductal hyperplasia negative for malignancy.  Prognostic showed ER 95% PR 40%, HER2 3+ KI of 50%   05/08/2022 Cancer Staging   Staging form: Breast, AJCC 8th Edition - Clinical stage from 05/08/2022: Stage IB (cT2, cN0, cM0, G2, ER+, PR+, HER2+) - Signed by Bettejane Brownie, PA-C on 05/08/2022 Stage prefix: Initial diagnosis Method of lymph node assessment: Clinical Histologic grading system: 3 grade system   05/18/2022 - 09/16/2022 Chemotherapy   Patient is on Treatment Plan : BREAST  Docetaxel  + Carboplatin  + Trastuzumab  + Pertuzumab   (TCHP) q21d      05/23/2022 Genetic Testing   Negative genetics for [facility] CustomNext-Cancer +RNAinsight Panel.  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 Report date is 05/23/2022.   The CustomNext-Cancer+RNAinsight panel offered by [employer] [facility] includes sequencing and rearrangement analysis for the following 47 genes:  APC, ATM, AXIN2, BARD1, BMPR1A, BRCA1, BRCA2, BRIP1, CDH1, CDK4, CDKN2A, CHEK2, DICER1, EPCAM, GREM1, HOXB13, MEN1, MLH1, MSH2, MSH3, MSH6, MUTYH, NBN, NF1, NF2, NTHL1, PALB2, PMS2, POLD1, POLE, PTEN, RAD51C, RAD51D, RECQL, RET, SDHA, SDHAF2, SDHB, SDHC, SDHD, SMAD4, SMARCA4, STK11, TP53, TSC1, TSC2, and VHL.  RNA data is  routinely analyzed for use in variant interpretation for all genes.   10/17/2022 - 06/05/2023 Chemotherapy   Patient is on Treatment Plan : BREAST Trastuzumab  + Pertuzumab  q21d x 11 cycles     11/07/2022 Surgery   Left breast lumpectomy: No residual invasive  carcinoma or DCIS.  3 SLN negative for cancer, ypT0,pN0   11/07/2022 Cancer Staging   Staging form: Breast, AJCC 8th Edition - Pathologic stage from 11/07/2022: No Stage Recommended (ypT0, pN0, cM0) - Signed by Percival Brace, NP on 11/28/2022 Stage prefix: Post-therapy   12/18/2022 - 01/16/2023 Radiation Therapy   Plan Name: Breast_L_BH Site: Breast, Left Technique: 3D Mode: Photon Dose Per Fraction: 2.66 Gy Prescribed Dose (Delivered / Prescribed): 42.56 Gy / 42.56 Gy Prescribed Fxs (Delivered / Prescribed): 16 / 16   Plan Name: Brst_L_Bst_BH Site: Breast, Left Technique: 3D Mode: Photon Dose Per Fraction: 2 Gy Prescribed Dose (Delivered / Prescribed): 8 Gy / 8 Gy Prescribed Fxs (Delivered / Prescribed): 4 / 4     02/2023 -  Anti-estrogen oral therapy   Tamoxifen  daily     CURRENT THERAPY: Tamoxifen ; b12, oral iron  INTERVAL HISTORY: Ludia is here today virtually to discuss her labs from Nov 30, 2023.  She has been taking B12 5000 mcg a day, and an iron supplement.  She continues on tamoxifen  with good tolerance.  She denies any side effects from any of these medications.  Her B12 level when tested last week was 6070 up from 117 3 months prior, and her iron studies had normalized with her ferritin of 22 up from 6 three months prior.     Patient Active Problem List   Diagnosis Date Noted   Genetic testing 05/30/2022   Family history of breast cancer 05/10/2022   Family history of ovarian cancer 05/10/2022   Malignant neoplasm of upper-outer quadrant of left breast in female, estrogen receptor positive (HCC) 05/08/2022   Food impaction of esophagus    Schatzki's ring    Hx of migraine headaches 11/17/2013   Ureteral calculus 11/17/2013   GERD (gastroesophageal reflux disease) 11/17/2013   Esophageal obstruction due to food impaction 11/17/2013    is allergic to amoxicillin, coconut (cocos nucifera), penicillins, duloxetine, hydrocodone ,  hydrocodone -acetaminophen , and moxifloxacin.  MEDICAL HISTORY: Past Medical History:  Diagnosis Date   Anemia    history of   Breast cancer, left (HCC)    Dyspnea    even with speaking   Family history of adverse reaction to anesthesia    mother slow to awaken   Family history of breast cancer 05/10/2022   Family history of ovarian cancer 05/10/2022   Fibromyalgia    GERD (gastroesophageal reflux disease)    History of kidney stones    last stone 6 months ago    Migraine    Seasonal allergies    Tinnitus    Vertigo     SURGICAL HISTORY: Past Surgical History:  Procedure Laterality Date   ADENOIDECTOMY  1991   BREAST BIOPSY  11/06/2022   MM LT RADIOACTIVE SEED LOC MAMMO GUIDE 11/06/2022 [facility]   BREAST BIOPSY  11/06/2022   MM LT RADIOACTIVE SEED EA ADD LESION LOC MAMMO GUIDE 11/06/2022 [facility]AMMO[facility]   BREAST LUMPECTOMY WITH RADIOACTIVE SEED AND SENTINEL LYMPH NODE BIOPSY Left 11/07/2022   Procedure: LEFT BREAST LUMPECTOMY WITH RADIOACTIVE SEEDX2 AND SENTINEL LYMPH NODE BIOPSY;  Surgeon: Oza Blumenthal, MD;  Location: [facility];  Service: General;  Laterality: Left;   CYSTOSCOPY WITH RETROGRADE PYELOGRAM, URETEROSCOPY AND STENT  PLACEMENT Left 02/22/2021   Procedure: CYSTOSCOPY WITH RETROGRADE PYELOGRAM, URETEROSCOPY AND STENT PLACEMENT;  Surgeon: Sherlyn Ditto, MD;  Location: [facility];  Service: Urology;  Laterality: Left;   CYSTOSCOPY/URETEROSCOPY/HOLMIUM LASER/STENT PLACEMENT Left 03/10/2021   Procedure: CYSTOSCOPY RETROGRADE, LEFT URETEROSCOPY/ STONE BASKETRY /HOLMIUM LASER/STENT EXCHANGE;  Surgeon: Andrez Banker, MD;  Location: [facility];  Service: Urology;  Laterality: Left;   ESOPHAGOGASTRODUODENOSCOPY N/A 11/17/2013   Procedure: ESOPHAGOGASTRODUODENOSCOPY (EGD);  Surgeon: Janel Medford, MD;  Location: [facility] END[facility]COPY;  Service: Endoscopy;  Laterality: N/A;   ESOPHAGOGASTRODUODENOSCOPY N/A 10/15/2015   Procedure: ESOPHAGOGASTRODUODENOSCOPY  (EGD);  Surgeon: Kenney Peacemaker, MD;  Location: [facility] ENDOSCOPY;  Service: Endoscopy;  Laterality: N/A;   HOLMIUM LASER APPLICATION  03/10/2021   Procedure: HOLMIUM LASER APPLICATION;  Surgeon: Andrez Banker, MD;  Location: [facility];  Service: Urology;;   NASAL SEPTOPLASTY W/ TURBINOPLASTY Bilateral 03/18/2018   Procedure: BILATERAL NASAL SEPTOPLASTY WITH TURBINATE REDUCTION;  Surgeon: Lenton Rail, MD;  Location: [facility];  Service: ENT;  Laterality: Bilateral;   PORT-A-CATH REMOVAL Right 07/05/2023   Procedure: REMOVAL PORT-A-CATH;  Surgeon: Oza Blumenthal, MD;  Location: [facility];  Service: General;  Laterality: Right;   PORTACATH PLACEMENT Right 05/17/2022   Procedure: PORT-A-CATH INSERTION WITH ULTRASOUND GUIDANCE;  Surgeon: Oza Blumenthal, MD;  Location: [facility];  Service: General;  Laterality: Right;   TONSILLECTOMY  1991   TUBAL LIGATION  2005    SOCIAL HISTORY: Social History   Socioeconomic History   Marital status: Single    Spouse name: Not on file   Number of children: Not on file   Years of education: Not on file   Highest education level: Not on file  Occupational History   Not on file  Tobacco Use   Smoking status: Never    Passive exposure: Past   Smokeless tobacco: Never  Vaping Use   Vaping status: Never Used  Substance and Sexual Activity   Alcohol use: No   Drug use: No   Sexual activity: Not on file  Other Topics Concern   Not on file  Social History Narrative   ** Merged History Encounter **       Social Drivers of Health   Financial Resource Strain: Low Risk  (05/10/2022)   Overall Financial Resource Strain (CARDIA)    Difficulty of Paying Living Expenses: Not very hard  Food Insecurity: No Food Insecurity (12/05/2022)   Hunger Vital Sign    Worried About Running Out of Food in the Last Year: Never true    Ran Out of Food in the Last Year: Never true  Transportation Needs: No Transportation Needs  (12/05/2022)   PRAPARE - Administrator, Civil Service (Medical): No    Lack of Transportation (Non-Medical): No  Physical Activity: Not on file  Stress: Not on file  Social Connections: Not on file  Intimate Partner Violence: Not At Risk (12/05/2022)   Humiliation, Afraid, Rape, and Kick questionnaire    Fear of Current or Ex-Partner: No    Emotionally Abused: No    Physically Abused: No    Sexually Abused: No    FAMILY HISTORY: Family History  Problem Relation Age of Onset   Asthma Mother    Diabetes Mother    Colon polyps Mother    Ovarian cancer Maternal Grandmother 15   Diabetes Maternal Grandmother    Esophageal cancer Maternal Grandfather        d. 30   Breast  cancer Other        MGM's sisters x2   Cervical cancer Cousin        mat cousin; dx <40    Review of Systems  Constitutional:  Negative for appetite change, chills, fatigue, fever and unexpected weight change.  HENT:   Negative for hearing loss, lump/mass and trouble swallowing.   Eyes:  Negative for eye problems and icterus.  Respiratory:  Negative for chest tightness, cough and shortness of breath.   Cardiovascular:  Negative for chest pain, leg swelling and palpitations.  Gastrointestinal:  Negative for abdominal distention, abdominal pain, constipation, diarrhea, nausea and vomiting.  Endocrine: Negative for hot flashes.  Genitourinary:  Negative for difficulty urinating.   Musculoskeletal:  Negative for arthralgias.  Skin:  Negative for itching and rash.  Neurological:  Negative for dizziness, extremity weakness, headaches and numbness.  Hematological:  Negative for adenopathy. Does not bruise/bleed easily.  Psychiatric/Behavioral:  Negative for depression. The patient is not nervous/anxious.       PHYSICAL EXAMINATION Patient sounds well.  In no apparent distress.  Mood and behavior are normal.  LABORATORY DATA:  CBC    Component Value Date/Time   WBC 2.7 (L) 11/30/2023 0928   WBC  2.9 (L) 06/26/2023 0803   RBC 3.83 (L) 11/30/2023 0929   RBC 3.83 (L) 11/30/2023 0928   HGB 11.8 (L) 11/30/2023 0928   HCT 33.7 (L) 11/30/2023 0928   HCT 33.0 (L) 09/07/2023 1300   PLT 133 (L) 11/30/2023 0928   MCV 88.0 11/30/2023 0928   MCH 30.8 11/30/2023 0928   MCHC 35.0 11/30/2023 0928   RDW 13.9 11/30/2023 0928   LYMPHSABS 0.7 11/30/2023 0928   MONOABS 0.2 11/30/2023 0928   EOSABS 0.1 11/30/2023 0928   BASOSABS 0.0 11/30/2023 0928    CMP     Component Value Date/Time   NA 139 11/30/2023 0928   K 3.6 11/30/2023 0928   CL 107 11/30/2023 0928   CO2 28 11/30/2023 0928   GLUCOSE 104 (H) 11/30/2023 0928   BUN 16 11/30/2023 0928   CREATININE 0.97 11/30/2023 0928   CALCIUM 8.6 (L) 11/30/2023 0928   PROT 6.4 (L) 11/30/2023 0928   ALBUMIN 3.7 11/30/2023 0928   AST 11 (L) 11/30/2023 0928   ALT 9 11/30/2023 0928   ALKPHOS 47 11/30/2023 0928   BILITOT 0.5 11/30/2023 0928   GFRNONAA >60 11/30/2023 0928   GFRAA >90 11/04/2014 1100     ASSESSMENT and THERAPY PLAN:   Malignant neoplasm of upper-outer quadrant of left breast in female, estrogen receptor positive (HCC) Breast cancer.  Currently on tamoxifen  tolerating well - Continue on tamoxifen  and follow-up with Dr. Arno Bibles as scheduled  CBC abnormalities.  Her white blood cells, hemoglobin, and platelets have all been mildly decreased.  Her neutrophils have normalized at this lab check. - Questionably secondary to iron and B12 deficiency.  Hemoglobin almost normalized.  Will recheck in 3 months.  Iron deficiency Continues on iron supplements.  Ferritin improved -Continue iron supplementation -Recheck labs again in 3 months  Vitamin B12 supplementation - Her most recent level was 6070.  I recommended she change from 5000 mcg to 1000 mcg daily - Will recheck labs again in 3 months   The patient was provided an opportunity to ask questions and all were answered. The patient agreed with the plan and demonstrated an  understanding of the instructions.   The patient was advised to call back or seek an in-person evaluation if the symptoms worsen  or if the condition fails to improve as anticipated.   I provided 10 minutes of non face-to-face telephone visit time during this encounter, and > 50% was spent counseling as documented under my assessment & plan.    Alwin Baars, NP 12/17/23 12:31 PM Medical Oncology and Hematology [facility] [address] [place], [place] [address] Tel. [phone]    Fax. [phone]  *Total Encounter Time as defined by the Centers for Medicare and Medicaid Services includes, in addition to the face-to-face time of a patient visit (documented in the note above) non-face-to-face time: obtaining and reviewing outside history, ordering and reviewing medications, tests or procedures, care coordination (communications with other health care professionals or caregivers) and documentation in the medical record.

## 2024-01-17 DIAGNOSIS — G43709 Chronic migraine without aura, not intractable, without status migrainosus: Secondary | ICD-10-CM | POA: Diagnosis not present

## 2024-01-17 DIAGNOSIS — M797 Fibromyalgia: Secondary | ICD-10-CM | POA: Diagnosis not present

## 2024-01-17 DIAGNOSIS — M2550 Pain in unspecified joint: Secondary | ICD-10-CM | POA: Diagnosis not present

## 2024-02-20 DIAGNOSIS — R0600 Dyspnea, unspecified: Secondary | ICD-10-CM | POA: Diagnosis not present

## 2024-02-20 DIAGNOSIS — R079 Chest pain, unspecified: Secondary | ICD-10-CM | POA: Diagnosis not present

## 2024-02-20 DIAGNOSIS — R55 Syncope and collapse: Secondary | ICD-10-CM | POA: Diagnosis not present

## 2024-02-20 DIAGNOSIS — R0602 Shortness of breath: Secondary | ICD-10-CM | POA: Diagnosis not present

## 2024-02-21 DIAGNOSIS — R55 Syncope and collapse: Secondary | ICD-10-CM | POA: Diagnosis not present

## 2024-02-21 DIAGNOSIS — R0602 Shortness of breath: Secondary | ICD-10-CM | POA: Diagnosis not present

## 2024-02-22 DIAGNOSIS — R0600 Dyspnea, unspecified: Secondary | ICD-10-CM | POA: Diagnosis not present

## 2024-02-26 DIAGNOSIS — R079 Chest pain, unspecified: Secondary | ICD-10-CM | POA: Diagnosis not present

## 2024-02-26 DIAGNOSIS — R55 Syncope and collapse: Secondary | ICD-10-CM | POA: Diagnosis not present

## 2024-02-26 DIAGNOSIS — R9431 Abnormal electrocardiogram [ECG] [EKG]: Secondary | ICD-10-CM | POA: Diagnosis not present

## 2024-02-26 DIAGNOSIS — R0609 Other forms of dyspnea: Secondary | ICD-10-CM | POA: Diagnosis not present

## 2024-02-26 DIAGNOSIS — Z8249 Family history of ischemic heart disease and other diseases of the circulatory system: Secondary | ICD-10-CM | POA: Diagnosis not present

## 2024-02-26 DIAGNOSIS — R0602 Shortness of breath: Secondary | ICD-10-CM | POA: Diagnosis not present

## 2024-02-27 DIAGNOSIS — R9431 Abnormal electrocardiogram [ECG] [EKG]: Secondary | ICD-10-CM | POA: Diagnosis not present

## 2024-03-07 ENCOUNTER — Other Ambulatory Visit: Payer: Self-pay | Admitting: *Deleted

## 2024-03-07 DIAGNOSIS — D508 Other iron deficiency anemias: Secondary | ICD-10-CM

## 2024-03-07 DIAGNOSIS — C50412 Malignant neoplasm of upper-outer quadrant of left female breast: Secondary | ICD-10-CM

## 2024-03-11 ENCOUNTER — Inpatient Hospital Stay: Attending: Hematology and Oncology

## 2024-03-11 DIAGNOSIS — Z800 Family history of malignant neoplasm of digestive organs: Secondary | ICD-10-CM | POA: Insufficient documentation

## 2024-03-11 DIAGNOSIS — N6452 Nipple discharge: Secondary | ICD-10-CM | POA: Diagnosis not present

## 2024-03-11 DIAGNOSIS — C50412 Malignant neoplasm of upper-outer quadrant of left female breast: Secondary | ICD-10-CM | POA: Diagnosis not present

## 2024-03-11 DIAGNOSIS — Z923 Personal history of irradiation: Secondary | ICD-10-CM | POA: Insufficient documentation

## 2024-03-11 DIAGNOSIS — Z79810 Long term (current) use of selective estrogen receptor modulators (SERMs): Secondary | ICD-10-CM | POA: Insufficient documentation

## 2024-03-11 DIAGNOSIS — Z170 Estrogen receptor positive status [ER+]: Secondary | ICD-10-CM | POA: Diagnosis not present

## 2024-03-11 DIAGNOSIS — Z1721 Progesterone receptor positive status: Secondary | ICD-10-CM | POA: Insufficient documentation

## 2024-03-11 DIAGNOSIS — Z9221 Personal history of antineoplastic chemotherapy: Secondary | ICD-10-CM | POA: Diagnosis not present

## 2024-03-11 DIAGNOSIS — Z8041 Family history of malignant neoplasm of ovary: Secondary | ICD-10-CM | POA: Insufficient documentation

## 2024-03-11 DIAGNOSIS — Z8049 Family history of malignant neoplasm of other genital organs: Secondary | ICD-10-CM | POA: Insufficient documentation

## 2024-03-11 DIAGNOSIS — Z79899 Other long term (current) drug therapy: Secondary | ICD-10-CM | POA: Insufficient documentation

## 2024-03-11 DIAGNOSIS — Z803 Family history of malignant neoplasm of breast: Secondary | ICD-10-CM | POA: Diagnosis not present

## 2024-03-11 DIAGNOSIS — N644 Mastodynia: Secondary | ICD-10-CM | POA: Diagnosis not present

## 2024-03-11 DIAGNOSIS — Z1731 Human epidermal growth factor receptor 2 positive status: Secondary | ICD-10-CM | POA: Diagnosis not present

## 2024-03-11 DIAGNOSIS — D508 Other iron deficiency anemias: Secondary | ICD-10-CM

## 2024-03-11 LAB — CMP (CANCER CENTER ONLY)
ALT: 12 U/L (ref 0–44)
AST: 13 U/L — ABNORMAL LOW (ref 15–41)
Albumin: 3.6 g/dL (ref 3.5–5.0)
Alkaline Phosphatase: 47 U/L (ref 38–126)
Anion gap: 6 (ref 5–15)
BUN: 13 mg/dL (ref 6–20)
CO2: 27 mmol/L (ref 22–32)
Calcium: 8.5 mg/dL — ABNORMAL LOW (ref 8.9–10.3)
Chloride: 108 mmol/L (ref 98–111)
Creatinine: 0.97 mg/dL (ref 0.44–1.00)
GFR, Estimated: >60 mL/min (ref >60)
Glucose, Bld: 84 mg/dL (ref 70–99)
Potassium: 3.7 mmol/L (ref 3.5–5.1)
Sodium: 141 mmol/L (ref 135–145)
Total Bilirubin: 0.4 mg/dL (ref 0.0–1.2)
Total Protein: 6.2 g/dL — ABNORMAL LOW (ref 6.5–8.1)

## 2024-03-11 LAB — CBC WITH DIFFERENTIAL (CANCER CENTER ONLY)
Abs Immature Granulocytes: 0 K/uL (ref 0.00–0.07)
Basophils Absolute: 0 K/uL (ref 0.0–0.1)
Basophils Relative: 1 %
Eosinophils Absolute: 0.2 K/uL (ref 0.0–0.5)
Eosinophils Relative: 7 %
HCT: 34.9 % — ABNORMAL LOW (ref 36.0–46.0)
Hemoglobin: 12.4 g/dL (ref 12.0–15.0)
Immature Granulocytes: 0 %
Lymphocytes Relative: 31 %
Lymphs Abs: 0.8 K/uL (ref 0.7–4.0)
MCH: 32.3 pg (ref 26.0–34.0)
MCHC: 35.5 g/dL (ref 30.0–36.0)
MCV: 90.9 fL (ref 80.0–100.0)
Monocytes Absolute: 0.2 K/uL (ref 0.1–1.0)
Monocytes Relative: 7 %
Neutro Abs: 1.4 K/uL — ABNORMAL LOW (ref 1.7–7.7)
Neutrophils Relative %: 54 %
Platelet Count: 151 K/uL (ref 150–400)
RBC: 3.84 MIL/uL — ABNORMAL LOW (ref 3.87–5.11)
RDW: 12.9 % (ref 11.5–15.5)
WBC Count: 2.6 K/uL — ABNORMAL LOW (ref 4.0–10.5)
nRBC: 0 % (ref 0.0–0.2)

## 2024-03-11 LAB — TSH: TSH: 3.3 u[IU]/mL (ref 0.350–4.500)

## 2024-03-11 LAB — IRON AND IRON BINDING CAPACITY (CC-WL,HP ONLY)
Iron: 90 ug/dL (ref 28–170)
Saturation Ratios: 29 % (ref 10.4–31.8)
TIBC: 307 ug/dL (ref 250–450)
UIBC: 217 ug/dL (ref 148–442)

## 2024-03-11 LAB — FERRITIN: Ferritin: 32 ng/mL (ref 11–307)

## 2024-03-11 LAB — VITAMIN B12: Vitamin B-12: 358 pg/mL (ref 180–914)

## 2024-03-13 ENCOUNTER — Encounter: Payer: Self-pay | Admitting: Adult Health

## 2024-03-13 ENCOUNTER — Inpatient Hospital Stay (HOSPITAL_BASED_OUTPATIENT_CLINIC_OR_DEPARTMENT_OTHER): Admitting: Adult Health

## 2024-03-13 DIAGNOSIS — N6452 Nipple discharge: Secondary | ICD-10-CM

## 2024-03-13 DIAGNOSIS — Z1721 Progesterone receptor positive status: Secondary | ICD-10-CM | POA: Diagnosis not present

## 2024-03-13 DIAGNOSIS — C50412 Malignant neoplasm of upper-outer quadrant of left female breast: Secondary | ICD-10-CM

## 2024-03-13 DIAGNOSIS — Z79810 Long term (current) use of selective estrogen receptor modulators (SERMs): Secondary | ICD-10-CM

## 2024-03-13 DIAGNOSIS — Z803 Family history of malignant neoplasm of breast: Secondary | ICD-10-CM

## 2024-03-13 DIAGNOSIS — Z8041 Family history of malignant neoplasm of ovary: Secondary | ICD-10-CM

## 2024-03-13 DIAGNOSIS — Z8049 Family history of malignant neoplasm of other genital organs: Secondary | ICD-10-CM

## 2024-03-13 DIAGNOSIS — Z9221 Personal history of antineoplastic chemotherapy: Secondary | ICD-10-CM

## 2024-03-13 DIAGNOSIS — Z800 Family history of malignant neoplasm of digestive organs: Secondary | ICD-10-CM

## 2024-03-13 DIAGNOSIS — Z170 Estrogen receptor positive status [ER+]: Secondary | ICD-10-CM | POA: Diagnosis not present

## 2024-03-13 DIAGNOSIS — Z923 Personal history of irradiation: Secondary | ICD-10-CM

## 2024-03-13 DIAGNOSIS — Z1732 Human epidermal growth factor receptor 2 negative status: Secondary | ICD-10-CM

## 2024-03-13 NOTE — Progress Notes (Signed)
 [facility] Cancer Follow up:    [name], [name], DO [address] [place] [address]   DIAGNOSIS: Cancer Staging  Malignant neoplasm of upper-outer quadrant of left breast in female, estrogen receptor positive (HCC) Staging form: Breast, AJCC 8th Edition - Clinical stage from 05/08/2022: Stage IB (cT2, cN0, cM0, G2, ER+, PR+, HER2+) - Signed by Lanell Donald Stagger, PA-C on 05/08/2022 Stage prefix: Initial diagnosis Method of lymph node assessment: Clinical Histologic grading system: 3 grade system - Pathologic stage from 11/07/2022: ypT0, ypN0, cM0 - Signed by Crawford Morna Pickle, NP on 11/28/2022 Stage prefix: Post-therapy  I connected with Alan SHAUNNA Schick on 03/13/24 at  9:20 AM EDT by telephone and verified that I am speaking with the correct person using two identifiers.  I discussed the limitations, risks, security and privacy concerns of performing an evaluation and management service by telephone and the availability of in person appointments.  I also discussed with the patient that there may be a patient responsible charge related to this service. The patient expressed understanding and agreed to proceed.  Patient location: home Provider location: [facility] office  SUMMARY OF ONCOLOGIC HISTORY: Oncology History  Malignant neoplasm of upper-outer quadrant of left breast in female, estrogen receptor positive (HCC)  04/25/2022 Imaging   Patient had diagnostic bilateral mammogram given palpable mass in the left breast.  This showed a 2.6 x 3.5 x 3 cm irregular mass as well as possible satellite mass which is indeterminate.  An ultrasound was recommended.  Ultrasound of the left breast mass showed 3.9 x 2.4 x 2.3 cm irregular mass with an irregular and spiculated margin at 3:00 middle depth 5 cm from the nipple, irregular mass is hypoechoic.  In the left breast at 1 o'clock position, 3 cm from the nipple there is a round mass measuring 0.3 x 0.2 x 0.3 cm.  No  significant abnormalities were seen sonographically in the left axilla.  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  Left breast needle core biopsy at 1:00 3 cm from the nipple showed fibrocystic changes with usual ductal hyperplasia negative for malignancy.  Prognostic showed ER 95% PR 40%, HER2 3+ KI of 50%   05/08/2022 Cancer Staging   Staging form: Breast, AJCC 8th Edition - Clinical stage from 05/08/2022: Stage IB (cT2, cN0, cM0, G2, ER+, PR+, HER2+) - Signed by Lanell Donald Stagger, PA-C on 05/08/2022 Stage prefix: Initial diagnosis Method of lymph node assessment: Clinical Histologic grading system: 3 grade system   05/18/2022 - 09/16/2022 Chemotherapy   Patient is on Treatment Plan : BREAST  Docetaxel  + Carboplatin  + Trastuzumab  + Pertuzumab   (TCHP) q21d      05/23/2022 Genetic Testing   Negative genetics for [facility] CustomNext-Cancer +RNAinsight Panel.  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 Report date is 05/23/2022.   The CustomNext-Cancer+RNAinsight panel offered by [employer] includes sequencing and rearrangement analysis for the following 47 genes:  APC, ATM, AXIN2, BARD1, BMPR1A, BRCA1, BRCA2, BRIP1, CDH1, CDK4, CDKN2A, CHEK2, DICER1, EPCAM, GREM1, HOXB13, MEN1, MLH1, MSH2, MSH3, MSH6, MUTYH, NBN, NF1, NF2, NTHL1, PALB2, PMS2, POLD1, POLE, PTEN, RAD51C, RAD51D, RECQL, RET, SDHA, SDHAF2, SDHB, SDHC, SDHD, SMAD4, SMARCA4, STK11, TP53, TSC1, TSC2, and VHL.  RNA data is routinely analyzed for  use in variant interpretation for all genes.   10/17/2022 - 06/05/2023 Chemotherapy   Patient is on Treatment Plan : BREAST Trastuzumab  + Pertuzumab  q21d x 11 cycles     11/07/2022 Surgery   Left breast lumpectomy: No residual invasive carcinoma or DCIS.  3  SLN negative for cancer, ypT0,pN0   11/07/2022 Cancer Staging   Staging form: Breast, AJCC 8th Edition - Pathologic stage from 11/07/2022: No Stage Recommended (ypT0, pN0, cM0) - Signed by Crawford Morna Pickle, NP on 11/28/2022 Stage prefix: Post-therapy   12/18/2022 - 01/16/2023 Radiation Therapy   Plan Name: Breast_L_BH Site: Breast, Left Technique: 3D Mode: Photon Dose Per Fraction: 2.66 Gy Prescribed Dose (Delivered / Prescribed): 42.56 Gy / 42.56 Gy Prescribed Fxs (Delivered / Prescribed): 16 / 16   Plan Name: Brst_L_Bst_BH Site: Breast, Left Technique: 3D Mode: Photon Dose Per Fraction: 2 Gy Prescribed Dose (Delivered / Prescribed): 8 Gy / 8 Gy Prescribed Fxs (Delivered / Prescribed): 4 / 4     02/2023 -  Anti-estrogen oral therapy   Tamoxifen  daily     CURRENT THERAPY: tamoxifen   INTERVAL HISTORY:  Discussed the use of AI scribe software for clinical note transcription with the patient, who gave verbal consent to proceed.  History of Present Illness [name] [name] is a 48 year old female who presents with nipple discharge. .  She has experienced dry discharge around her nipple since May, noticeable as flakes in her bra in the morning. A punch biopsy was performed in April due to this discharge and was negative for carcinoma, along with imaging that was completed including mammogram, ultrasound, and breast MRI.  She reports soreness in the breast, with heaviness when sleeping on her left side. There is a change in color compared to the other breast, but no redness, significant swelling, heat, or fever.  She underwent breast surgery and radiation in April 2024 after a diagnosis in 2023. She takes a daily women's vitamin but not B12 or iron supplements. Her red blood cell count is 12.4, and she is concerned about a low white blood cell count.     Patient Active Problem List   Diagnosis Date Noted   Genetic testing 05/30/2022   Family history of breast cancer  05/10/2022   Family history of ovarian cancer 05/10/2022   Malignant neoplasm of upper-outer quadrant of left breast in female, estrogen receptor positive (HCC) 05/08/2022   Food impaction of esophagus    Schatzki's ring    Hx of migraine headaches 11/17/2013   Ureteral calculus 11/17/2013   GERD (gastroesophageal reflux disease) 11/17/2013   Esophageal obstruction due to food impaction 11/17/2013    is allergic to amoxicillin, coconut (cocos nucifera), penicillins, duloxetine, hydrocodone , hydrocodone -acetaminophen , and moxifloxacin.  MEDICAL HISTORY: Past Medical History:  Diagnosis Date   Anemia    history of   Breast cancer, left (HCC)    Dyspnea    even with speaking   Family history of adverse reaction to anesthesia    mother slow to awaken   Family history of breast cancer 05/10/2022   Family history of ovarian cancer 05/10/2022   Fibromyalgia    GERD (gastroesophageal reflux disease)    History of kidney stones    last stone 6 months ago    Migraine    Seasonal allergies    Tinnitus    Vertigo     SURGICAL HISTORY: Past Surgical History:  Procedure Laterality Date   ADENOIDECTOMY  1991   BREAST BIOPSY  11/06/2022  MM LT RADIOACTIVE SEED LOC MAMMO GUIDE 11/06/2022 [facility]MO[facility]   BREAST BIOPSY  11/06/2022   MM LT RADIOACTIVE SEED EA ADD LESION LOC MAMMO GUIDE 11/06/2022 [facility]AMMO[facility]   BREAST LUMPECTOMY WITH RADIOACTIVE SEED AND SENTINEL LYMPH NODE BIOPSY Left 11/07/2022   Procedure: LEFT BREAST LUMPECTOMY WITH RADIOACTIVE SEEDX2 AND SENTINEL LYMPH NODE BIOPSY;  Surgeon: Vernetta Berg, MD;  Location: [facility];  Service: General;  Laterality: Left;   CYSTOSCOPY WITH RETROGRADE PYELOGRAM, URETEROSCOPY AND STENT PLACEMENT Left 02/22/2021   Procedure: CYSTOSCOPY WITH RETROGRADE PYELOGRAM, URETEROSCOPY AND STENT PLACEMENT;  Surgeon: Rosalind Zachary NOVAK, MD;  Location: [facility];  Service: Urology;  Laterality: Left;   CYSTOSCOPY/URETEROSCOPY/HOLMIUM LASER/STENT  PLACEMENT Left 03/10/2021   Procedure: CYSTOSCOPY RETROGRADE, LEFT URETEROSCOPY/ STONE BASKETRY /HOLMIUM LASER/STENT EXCHANGE;  Surgeon: Cam Morene ORN, MD;  Location: [facility];  Service: Urology;  Laterality: Left;   ESOPHAGOGASTRODUODENOSCOPY N/A 11/17/2013   Procedure: ESOPHAGOGASTRODUODENOSCOPY (EGD);  Surgeon: Toribio SHAUNNA Cedar, MD;  Location: [facility] ENDOSCOPY;  Service: Endoscopy;  Laterality: N/A;   ESOPHAGOGASTRODUODENOSCOPY N/A 10/15/2015   Procedure: ESOPHAGOGASTRODUODENOSCOPY (EGD);  Surgeon: Lupita FORBES Commander, MD;  Location: [facility] ENDOSCOPY;  Service: Endoscopy;  Laterality: N/A;   HOLMIUM LASER APPLICATION  03/10/2021   Procedure: HOLMIUM LASER APPLICATION;  Surgeon: Cam Morene ORN, MD;  Location: [facility];  Service: Urology;;   NASAL SEPTOPLASTY W/ TURBINOPLASTY Bilateral 03/18/2018   Procedure: BILATERAL NASAL SEPTOPLASTY WITH TURBINATE REDUCTION;  Surgeon: Arlana Arnt, MD;  Location: [facility];  Service: ENT;  Laterality: Bilateral;   PORT-A-CATH REMOVAL Right 07/05/2023   Procedure: REMOVAL PORT-A-CATH;  Surgeon: Vernetta Berg, MD;  Location: [facility];  Service: General;  Laterality: Right;   PORTACATH PLACEMENT Right 05/17/2022   Procedure: PORT-A-CATH INSERTION WITH ULTRASOUND GUIDANCE;  Surgeon: Vernetta Berg, MD;  Location: [facility];  Service: General;  Laterality: Right;   TONSILLECTOMY  1991   TUBAL LIGATION  2005    SOCIAL HISTORY: Social History   Socioeconomic History   Marital status: Single    Spouse name: Not on file   Number of children: Not on file   Years of education: Not on file   Highest education level: Not on file  Occupational History   Not on file  Tobacco Use   Smoking status: Never    Passive exposure: Past   Smokeless tobacco: Never  Vaping Use   Vaping status: Never Used  Substance and Sexual Activity   Alcohol use: No   Drug use: No   Sexual activity: Not on file  Other Topics  Concern   Not on file  Social History Narrative   ** Merged History Encounter **       Social Drivers of Health   Financial Resource Strain: Low Risk  (05/10/2022)   Overall Financial Resource Strain (CARDIA)    Difficulty of Paying Living Expenses: Not very hard  Food Insecurity: Low Risk  (02/20/2024)   Received from [facility]   Hunger Vital Sign    Within the past 12 months, you worried that your food would run out before you got money to buy more: Never true    Within the past 12 months, the food you bought just didn't last and you didn't have money to get more. : Never true  Transportation Needs: No Transportation Needs (02/20/2024)   Received from [employer]    In the past 12 months, has lack of reliable transportation kept you from medical appointments, meetings, work  or from getting things needed for daily living? : No  Physical Activity: Not on file  Stress: Not on file  Social Connections: Not on file  Intimate Partner Violence: Not At Risk (12/05/2022)   Humiliation, Afraid, Rape, and Kick questionnaire    Fear of Current or Ex-Partner: No    Emotionally Abused: No    Physically Abused: No    Sexually Abused: No    FAMILY HISTORY: Family History  Problem Relation Age of Onset   Asthma Mother    Diabetes Mother    Colon polyps Mother    Ovarian cancer Maternal Grandmother 23   Diabetes Maternal Grandmother    Esophageal cancer Maternal Grandfather        d. 49   Breast cancer Other        MGM's sisters x2   Cervical cancer Cousin        mat cousin; dx <40    Review of Systems  Constitutional:  Negative for appetite change, chills, fatigue, fever and unexpected weight change.  HENT:   Negative for hearing loss, lump/mass and trouble swallowing.   Eyes:  Negative for eye problems and icterus.  Respiratory:  Negative for chest tightness, cough and shortness of breath.   Cardiovascular:  Negative for chest pain, leg swelling and  palpitations.  Gastrointestinal:  Negative for abdominal distention, abdominal pain, constipation, diarrhea, nausea and vomiting.  Endocrine: Negative for hot flashes.  Genitourinary:  Negative for difficulty urinating.   Musculoskeletal:  Negative for arthralgias.  Skin:  Negative for itching and rash.  Neurological:  Negative for dizziness, extremity weakness, headaches and numbness.  Hematological:  Negative for adenopathy. Does not bruise/bleed easily.  Psychiatric/Behavioral:  Negative for depression. The patient is not nervous/anxious.       PHYSICAL EXAMINATION Patient sounds well, in no apparent distress, mood and behavior are normal, speech is normal, breathing is non labored  LABORATORY DATA:  CBC    Component Value Date/Time   WBC 2.6 (L) 03/11/2024 0937   WBC 2.9 (L) 06/26/2023 0803   RBC 3.84 (L) 03/11/2024 0937   HGB 12.4 03/11/2024 0937   HCT 34.9 (L) 03/11/2024 0937   HCT 33.0 (L) 09/07/2023 1300   PLT 151 03/11/2024 0937   MCV 90.9 03/11/2024 0937   MCH 32.3 03/11/2024 0937   MCHC 35.5 03/11/2024 0937   RDW 12.9 03/11/2024 0937   LYMPHSABS 0.8 03/11/2024 0937   MONOABS 0.2 03/11/2024 0937   EOSABS 0.2 03/11/2024 0937   BASOSABS 0.0 03/11/2024 0937    CMP     Component Value Date/Time   NA 141 03/11/2024 0937   K 3.7 03/11/2024 0937   CL 108 03/11/2024 0937   CO2 27 03/11/2024 0937   GLUCOSE 84 03/11/2024 0937   BUN 13 03/11/2024 0937   CREATININE 0.97 03/11/2024 0937   CALCIUM 8.5 (L) 03/11/2024 0937   PROT 6.2 (L) 03/11/2024 0937   ALBUMIN 3.6 03/11/2024 0937   AST 13 (L) 03/11/2024 0937   ALT 12 03/11/2024 0937   ALKPHOS 47 03/11/2024 0937   BILITOT 0.4 03/11/2024 0937   GFRNONAA >60 03/11/2024 0937   GFRAA >90 11/04/2014 1100     ASSESSMENT and THERAPY PLAN:   Assessment and Plan Assessment & Plan Malignant neoplasm of upper-outer quadrant of left breast Post-surgical soreness and heaviness likely due to surgery and radiation  therapy. - Refer to Dr. Vernetta for follow-up and breast examination as this is due in the next month anyway. - Advise wearing  a compression bra and performing massage as taught in physical therapy. - Instruct to report any development of redness, significant swelling, heat, or fever for potential antibiotic treatment.  Nipple discharge, left breast Persistent dry discharge with no significant changes or associated acute symptoms. - Refer to Dr. Vernetta for follow-up and breast examination. - Order bilateral breast mammogram due in December 2025.  Follow up in November 2025 as scheduled.    The patient was provided an opportunity to ask questions and all were answered. The patient agreed with the plan and demonstrated an understanding of the instructions.   The patient was advised to call back or seek an in-person evaluation if the symptoms worsen or if the condition fails to improve as anticipated.   I provided 10 minutes of non face-to-face telephone visit time during this encounter, and > 50% was spent counseling as documented under my assessment & plan.    Morna Kendall, NP 03/13/24 9:59 AM Medical Oncology and Hematology [facility] [address] [place], [place] [address] Tel. [phone]    Fax. [phone]  *Total Encounter Time as defined by the Centers for Medicare and Medicaid Services includes, in addition to the face-to-face time of a patient visit (documented in the note above) non-face-to-face time: obtaining and reviewing outside history, ordering and reviewing medications, tests or procedures, care coordination (communications with other health care professionals or caregivers) and documentation in the medical record.

## 2024-03-19 DIAGNOSIS — Z8249 Family history of ischemic heart disease and other diseases of the circulatory system: Secondary | ICD-10-CM | POA: Diagnosis not present

## 2024-03-19 DIAGNOSIS — R079 Chest pain, unspecified: Secondary | ICD-10-CM | POA: Diagnosis not present

## 2024-03-19 DIAGNOSIS — R0609 Other forms of dyspnea: Secondary | ICD-10-CM | POA: Diagnosis not present

## 2024-03-19 DIAGNOSIS — R9431 Abnormal electrocardiogram [ECG] [EKG]: Secondary | ICD-10-CM | POA: Diagnosis not present

## 2024-03-19 DIAGNOSIS — R55 Syncope and collapse: Secondary | ICD-10-CM | POA: Diagnosis not present

## 2024-03-19 NOTE — Progress Notes (Signed)
 7 Day Zio was applied for Syncope per Dr. Arne.

## 2024-03-31 DIAGNOSIS — N6452 Nipple discharge: Secondary | ICD-10-CM | POA: Diagnosis not present

## 2024-03-31 DIAGNOSIS — C50912 Malignant neoplasm of unspecified site of left female breast: Secondary | ICD-10-CM | POA: Diagnosis not present

## 2024-04-01 ENCOUNTER — Other Ambulatory Visit: Payer: Self-pay | Admitting: Surgery

## 2024-04-01 ENCOUNTER — Other Ambulatory Visit: Payer: Self-pay | Admitting: Adult Health

## 2024-04-01 DIAGNOSIS — C50412 Malignant neoplasm of upper-outer quadrant of left female breast: Secondary | ICD-10-CM

## 2024-04-01 DIAGNOSIS — R55 Syncope and collapse: Secondary | ICD-10-CM | POA: Diagnosis not present

## 2024-04-10 ENCOUNTER — Ambulatory Visit
Admission: RE | Admit: 2024-04-10 | Discharge: 2024-04-10 | Disposition: A | Source: Ambulatory Visit | Attending: Surgery | Admitting: Surgery

## 2024-04-10 ENCOUNTER — Ambulatory Visit
Admission: RE | Admit: 2024-04-10 | Discharge: 2024-04-10 | Disposition: A | Discharge status: Home / Self Care | Source: Ambulatory Visit | Attending: Surgery | Admitting: Surgery

## 2024-04-10 DIAGNOSIS — C50412 Malignant neoplasm of upper-outer quadrant of left female breast: Secondary | ICD-10-CM

## 2024-04-10 DIAGNOSIS — N6489 Other specified disorders of breast: Secondary | ICD-10-CM | POA: Diagnosis not present

## 2024-04-10 DIAGNOSIS — R928 Other abnormal and inconclusive findings on diagnostic imaging of breast: Secondary | ICD-10-CM | POA: Diagnosis not present

## 2024-04-30 DIAGNOSIS — R1310 Dysphagia, unspecified: Secondary | ICD-10-CM | POA: Diagnosis not present

## 2024-04-30 DIAGNOSIS — G43709 Chronic migraine without aura, not intractable, without status migrainosus: Secondary | ICD-10-CM | POA: Diagnosis not present

## 2024-04-30 DIAGNOSIS — M797 Fibromyalgia: Secondary | ICD-10-CM | POA: Diagnosis not present

## 2024-05-30 ENCOUNTER — Inpatient Hospital Stay: Admitting: Hematology and Oncology

## 2024-05-30 ENCOUNTER — Inpatient Hospital Stay: Attending: Hematology and Oncology

## 2024-05-30 ENCOUNTER — Telehealth: Payer: Self-pay | Admitting: *Deleted

## 2024-05-30 ENCOUNTER — Ambulatory Visit: Payer: Self-pay | Admitting: Hematology and Oncology

## 2024-05-30 VITALS — BP 108/73 | HR 73 | Temp 97.9°F | Resp 16 | Wt 214.0 lb

## 2024-05-30 DIAGNOSIS — Z8049 Family history of malignant neoplasm of other genital organs: Secondary | ICD-10-CM | POA: Diagnosis not present

## 2024-05-30 DIAGNOSIS — Z803 Family history of malignant neoplasm of breast: Secondary | ICD-10-CM | POA: Insufficient documentation

## 2024-05-30 DIAGNOSIS — Z8041 Family history of malignant neoplasm of ovary: Secondary | ICD-10-CM | POA: Diagnosis not present

## 2024-05-30 DIAGNOSIS — Z923 Personal history of irradiation: Secondary | ICD-10-CM | POA: Diagnosis not present

## 2024-05-30 DIAGNOSIS — N644 Mastodynia: Secondary | ICD-10-CM | POA: Diagnosis not present

## 2024-05-30 DIAGNOSIS — Z9221 Personal history of antineoplastic chemotherapy: Secondary | ICD-10-CM | POA: Diagnosis not present

## 2024-05-30 DIAGNOSIS — C50412 Malignant neoplasm of upper-outer quadrant of left female breast: Secondary | ICD-10-CM | POA: Insufficient documentation

## 2024-05-30 DIAGNOSIS — N6452 Nipple discharge: Secondary | ICD-10-CM | POA: Insufficient documentation

## 2024-05-30 DIAGNOSIS — Z800 Family history of malignant neoplasm of digestive organs: Secondary | ICD-10-CM | POA: Insufficient documentation

## 2024-05-30 DIAGNOSIS — Z79810 Long term (current) use of selective estrogen receptor modulators (SERMs): Secondary | ICD-10-CM | POA: Diagnosis not present

## 2024-05-30 DIAGNOSIS — Z1731 Human epidermal growth factor receptor 2 positive status: Secondary | ICD-10-CM | POA: Diagnosis not present

## 2024-05-30 DIAGNOSIS — D508 Other iron deficiency anemias: Secondary | ICD-10-CM

## 2024-05-30 DIAGNOSIS — Z1721 Progesterone receptor positive status: Secondary | ICD-10-CM | POA: Insufficient documentation

## 2024-05-30 DIAGNOSIS — Z170 Estrogen receptor positive status [ER+]: Secondary | ICD-10-CM | POA: Insufficient documentation

## 2024-05-30 LAB — CBC WITH DIFFERENTIAL (CANCER CENTER ONLY)
Abs Immature Granulocytes: 0 K/uL (ref 0.00–0.07)
Basophils Absolute: 0 K/uL (ref 0.0–0.1)
Basophils Relative: 0 %
Eosinophils Absolute: 0.1 K/uL (ref 0.0–0.5)
Eosinophils Relative: 5 %
HCT: 34 % — ABNORMAL LOW (ref 36.0–46.0)
Hemoglobin: 12.1 g/dL (ref 12.0–15.0)
Immature Granulocytes: 0 %
Lymphocytes Relative: 32 %
Lymphs Abs: 0.9 K/uL (ref 0.7–4.0)
MCH: 31.3 pg (ref 26.0–34.0)
MCHC: 35.6 g/dL (ref 30.0–36.0)
MCV: 88.1 fL (ref 80.0–100.0)
Monocytes Absolute: 0.2 K/uL (ref 0.1–1.0)
Monocytes Relative: 7 %
Neutro Abs: 1.5 K/uL — ABNORMAL LOW (ref 1.7–7.7)
Neutrophils Relative %: 56 %
Platelet Count: 160 K/uL (ref 150–400)
RBC: 3.86 MIL/uL — ABNORMAL LOW (ref 3.87–5.11)
RDW: 12.5 % (ref 11.5–15.5)
WBC Count: 2.7 K/uL — ABNORMAL LOW (ref 4.0–10.5)
nRBC: 0 % (ref 0.0–0.2)

## 2024-05-30 LAB — IRON AND IRON BINDING CAPACITY (CC-WL,HP ONLY)
Iron: 67 ug/dL (ref 28–170)
Saturation Ratios: 22 % (ref 10.4–31.8)
TIBC: 305 ug/dL (ref 250–450)
UIBC: 238 ug/dL

## 2024-05-30 LAB — CMP (CANCER CENTER ONLY)
ALT: 14 U/L (ref 0–44)
AST: 21 U/L (ref 15–41)
Albumin: 3.9 g/dL (ref 3.5–5.0)
Alkaline Phosphatase: 63 U/L (ref 38–126)
Anion gap: 9 (ref 5–15)
BUN: 12 mg/dL (ref 6–20)
CO2: 23 mmol/L (ref 22–32)
Calcium: 8.8 mg/dL — ABNORMAL LOW (ref 8.9–10.3)
Chloride: 108 mmol/L (ref 98–111)
Creatinine: 0.94 mg/dL (ref 0.44–1.00)
GFR, Estimated: >60 mL/min (ref >60)
Glucose, Bld: 97 mg/dL (ref 70–99)
Potassium: 3.9 mmol/L (ref 3.5–5.1)
Sodium: 140 mmol/L (ref 135–145)
Total Bilirubin: 0.4 mg/dL (ref 0.0–1.2)
Total Protein: 6.5 g/dL (ref 6.5–8.1)

## 2024-05-30 LAB — FERRITIN: Ferritin: 85 ng/mL (ref 11–307)

## 2024-05-30 NOTE — Telephone Encounter (Signed)
 [facility] order place for mobile draw.

## 2024-05-30 NOTE — Progress Notes (Signed)
 [facility] Cancer Follow up:    [name], [name], DO [address] [place] [address]   DIAGNOSIS:  Cancer Staging  Malignant neoplasm of upper-outer quadrant of left breast in female, estrogen receptor positive (HCC) Staging form: Breast, AJCC 8th Edition - Clinical stage from 05/08/2022: Stage IB (cT2, cN0, cM0, G2, ER+, PR+, HER2+) - Signed by Lanell Donald Stagger, PA-C on 05/08/2022 Stage prefix: Initial diagnosis Method of lymph node assessment: Clinical Histologic grading system: 3 grade system - Pathologic stage from 11/07/2022: ypT0, ypN0, cM0 - Signed by Crawford Morna Pickle, NP on 11/28/2022 Stage prefix: Post-therapy    SUMMARY OF ONCOLOGIC HISTORY: Oncology History  Malignant neoplasm of upper-outer quadrant of left breast in female, estrogen receptor positive (HCC)  04/25/2022 Imaging   Patient had diagnostic bilateral mammogram given palpable mass in the left breast.  This showed a 2.6 x 3.5 x 3 cm irregular mass as well as possible satellite mass which is indeterminate.  An ultrasound was recommended.  Ultrasound of the left breast mass showed 3.9 x 2.4 x 2.3 cm irregular mass with an irregular and spiculated margin at 3:00 middle depth 5 cm from the nipple, irregular mass is hypoechoic.  In the left breast at 1 o'clock position, 3 cm from the nipple there is a round mass measuring 0.3 x 0.2 x 0.3 cm.  No significant abnormalities were seen sonographically in the left axilla.  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  Left breast needle core biopsy at 1:00 3 cm from the nipple showed fibrocystic changes with usual ductal hyperplasia negative for malignancy.  Prognostic showed ER 95% PR 40%, HER2 3+ KI of 50%    05/08/2022 Cancer Staging   Staging form: Breast, AJCC 8th Edition - Clinical stage from 05/08/2022: Stage IB (cT2, cN0, cM0, G2, ER+, PR+, HER2+) - Signed by Lanell Donald Stagger, PA-C on 05/08/2022 Stage prefix: Initial diagnosis Method of lymph node assessment: Clinical Histologic grading system: 3 grade system   05/18/2022 - 09/16/2022 Chemotherapy   Patient is on Treatment Plan : BREAST  Docetaxel  + Carboplatin  + Trastuzumab  + Pertuzumab   (TCHP) q21d      05/23/2022 Genetic Testing   Negative genetics for [facility] CustomNext-Cancer +RNAinsight Panel.  VUS in ATM at [name].[id number] ([id number]). Report date is 05/23/2022.   The CustomNext-Cancer+RNAinsight panel offered by [employer] includes sequencing and rearrangement analysis for the following 47 genes:  APC, ATM, AXIN2, BARD1, BMPR1A, BRCA1, BRCA2, BRIP1, CDH1, CDK4, CDKN2A, CHEK2, DICER1, EPCAM, GREM1, HOXB13, MEN1, MLH1, MSH2, MSH3, MSH6, MUTYH, NBN, NF1, NF2, NTHL1, PALB2, PMS2, POLD1, POLE, PTEN, RAD51C, RAD51D, RECQL, RET, SDHA, SDHAF2, SDHB, SDHC, SDHD, SMAD4, SMARCA4, STK11, TP53, TSC1, TSC2, and VHL.  RNA data is routinely analyzed for use in variant interpretation for all genes.   10/17/2022 - 06/05/2023 Chemotherapy   Patient is on Treatment Plan : BREAST Trastuzumab  + Pertuzumab  q21d x 11 cycles     11/07/2022 Surgery   Left breast lumpectomy: No residual invasive carcinoma or DCIS.  3 SLN negative for cancer, ypT0,pN0   11/07/2022 Cancer Staging   Staging form: Breast, AJCC 8th Edition - Pathologic stage from 11/07/2022: No Stage Recommended (ypT0, pN0, cM0) - Signed by Crawford Morna Pickle, NP on 11/28/2022 Stage  prefix: Post-therapy   12/18/2022 - 01/16/2023 Radiation Therapy   Plan Name: Breast_L_BH Site: Breast, Left Technique: 3D Mode: Photon Dose Per Fraction: 2.66 Gy Prescribed Dose (Delivered / Prescribed): 42.56 Gy / 42.56 Gy Prescribed Fxs (Delivered / Prescribed): 16 / 16   Plan Name: Brst_L_Bst_BH Site:  Breast, Left Technique: 3D Mode: Photon Dose Per Fraction: 2 Gy Prescribed Dose (Delivered / Prescribed): 8 Gy / 8 Gy Prescribed Fxs (Delivered / Prescribed): 4 / 4     02/2023 -  Anti-estrogen oral therapy   Tamoxifen  daily     CURRENT THERAPY: Tamoxifen   INTERVAL HISTORY:  Discussed the use of AI scribe software for clinical note transcription with the patient, who gave verbal consent to proceed.  History of Present Illness   [name] [name] is a 48 year old female with breast cancer who presents for follow-up regarding breast symptoms and medication management.  She is experiencing persistent left breast pain and nipple discharge following radiation therapy for breast cancer. The discharge has decreased, but the breast remains tender and sore. Her breast remains sensitive and has not returned to its normal color, remaining darker since undergoing radiation therapy.  A recent mammogram and ultrasound showed no lesions or abnormalities to account for these symptoms. Another routine mammogram is scheduled for December 11th at [facility].  She has not required hospitalization recently and reports no new medications. She continues to take tamoxifen  without any problems and is also on iron pills.  No issues with breathing, bowel movements, or urination. She mentions having a new tattoo and notes that the side where it is located still hurts when pressure is applied.  Patient Active Problem List   Diagnosis Date Noted   Genetic testing 05/30/2022   Family history of breast cancer 05/10/2022   Family history of ovarian cancer 05/10/2022   Malignant neoplasm of upper-outer quadrant of left breast in female, estrogen receptor positive (HCC) 05/08/2022   Food impaction of esophagus    Schatzki's ring    Hx of migraine headaches 11/17/2013   Ureteral calculus 11/17/2013   GERD (gastroesophageal reflux disease) 11/17/2013   Esophageal obstruction due to food impaction 11/17/2013    is  allergic to amoxicillin, coconut (cocos nucifera), penicillins, duloxetine, hydrocodone , hydrocodone -acetaminophen , and moxifloxacin.  MEDICAL HISTORY: Past Medical History:  Diagnosis Date   Anemia    history of   Breast cancer, left (HCC)    Dyspnea    even with speaking   Family history of adverse reaction to anesthesia    mother slow to awaken   Family history of breast cancer 05/10/2022   Family history of ovarian cancer 05/10/2022   Fibromyalgia    GERD (gastroesophageal reflux disease)    History of kidney stones    last stone 6 months ago    Migraine    Seasonal allergies    Tinnitus    Vertigo     SURGICAL HISTORY: Past Surgical History:  Procedure Laterality Date   ADENOIDECTOMY  1991   BREAST BIOPSY  11/06/2022   MM LT RADIOACTIVE SEED LOC MAMMO GUIDE 11/06/2022 [facility]   BREAST BIOPSY  11/06/2022   MM LT RADIOACTIVE SEED EA ADD LESION LOC MAMMO GUIDE 11/06/2022 [facility]   BREAST LUMPECTOMY WITH RADIOACTIVE SEED AND SENTINEL LYMPH NODE BIOPSY Left 11/07/2022   Procedure: LEFT BREAST LUMPECTOMY WITH RADIOACTIVE SEEDX2 AND SENTINEL LYMPH NODE BIOPSY;  Surgeon: Vernetta Berg, MD;  Location: [facility];  Service: General;  Laterality: Left;   CYSTOSCOPY WITH RETROGRADE PYELOGRAM,  URETEROSCOPY AND STENT PLACEMENT Left 02/22/2021   Procedure: CYSTOSCOPY WITH RETROGRADE PYELOGRAM, URETEROSCOPY AND STENT PLACEMENT;  Surgeon: Rosalind Zachary NOVAK, MD;  Location: [facility];  Service: Urology;  Laterality: Left;   CYSTOSCOPY/URETEROSCOPY/HOLMIUM LASER/STENT PLACEMENT Left 03/10/2021   Procedure: CYSTOSCOPY RETROGRADE, LEFT URETEROSCOPY/ STONE BASKETRY /HOLMIUM LASER/STENT EXCHANGE;  Surgeon: Cam Morene ORN, MD;  Location: [facility];  Service: Urology;  Laterality: Left;   ESOPHAGOGASTRODUODENOSCOPY N/A 11/17/2013   Procedure: ESOPHAGOGASTRODUODENOSCOPY (EGD);  Surgeon: Toribio SHAUNNA Cedar, MD;  Location: [facility] ENDOSCOPY;  Service: Endoscopy;  Laterality:  N/A;   ESOPHAGOGASTRODUODENOSCOPY N/A 10/15/2015   Procedure: ESOPHAGOGASTRODUODENOSCOPY (EGD);  Surgeon: Lupita FORBES Commander, MD;  Location: [facility] ENDOSCOPY;  Service: Endoscopy;  Laterality: N/A;   HOLMIUM LASER APPLICATION  03/10/2021   Procedure: HOLMIUM LASER APPLICATION;  Surgeon: Cam Morene ORN, MD;  Location: [facility];  Service: Urology;;   NASAL SEPTOPLASTY W/ TURBINOPLASTY Bilateral 03/18/2018   Procedure: BILATERAL NASAL SEPTOPLASTY WITH TURBINATE REDUCTION;  Surgeon: Arlana Arnt, MD;  Location: [facility];  Service: ENT;  Laterality: Bilateral;   PORT-A-CATH REMOVAL Right 07/05/2023   Procedure: REMOVAL PORT-A-CATH;  Surgeon: Vernetta Berg, MD;  Location: [facility];  Service: General;  Laterality: Right;   PORTACATH PLACEMENT Right 05/17/2022   Procedure: PORT-A-CATH INSERTION WITH ULTRASOUND GUIDANCE;  Surgeon: Vernetta Berg, MD;  Location: [facility];  Service: General;  Laterality: Right;   TONSILLECTOMY  1991   TUBAL LIGATION  2005    SOCIAL HISTORY: Social History   Socioeconomic History   Marital status: Single    Spouse name: Not on file   Number of children: Not on file   Years of education: Not on file   Highest education level: Not on file  Occupational History   Not on file  Tobacco Use   Smoking status: Never    Passive exposure: Past   Smokeless tobacco: Never  Vaping Use   Vaping status: Never Used  Substance and Sexual Activity   Alcohol use: No   Drug use: No   Sexual activity: Not on file  Other Topics Concern   Not on file  Social History Narrative   ** Merged History Encounter **       Social Drivers of Health   Financial Resource Strain: Low Risk  (05/10/2022)   Overall Financial Resource Strain (CARDIA)    Difficulty of Paying Living Expenses: Not very hard  Food Insecurity: Low Risk  (02/20/2024)   Received from [facility]   Hunger Vital Sign    Within the past 12 months, you worried that your food  would run out before you got money to buy more: Never true    Within the past 12 months, the food you bought just didn't last and you didn't have money to get more. : Never true  Transportation Needs: No Transportation Needs (02/20/2024)   Received from [facility]    In the past 12 months, has lack of reliable transportation kept you from medical appointments, meetings, work or from getting things needed for daily living? : No  Physical Activity: Not on file  Stress: Not on file  Social Connections: Not on file  Intimate Partner Violence: Not At Risk (12/05/2022)   Humiliation, Afraid, Rape, and Kick questionnaire    Fear of Current or Ex-Partner: No    Emotionally Abused: No    Physically Abused: No    Sexually Abused: No    FAMILY HISTORY: Family History  Problem Relation  Age of Onset   Asthma Mother    Diabetes Mother    Colon polyps Mother    Ovarian cancer Maternal Grandmother 5   Diabetes Maternal Grandmother    Esophageal cancer Maternal Grandfather        d. 99   Breast cancer Other        MGM's sisters x2   Cervical cancer Cousin        mat cousin; dx <40    Review of Systems  Constitutional:  Negative for appetite change, chills, fatigue, fever and unexpected weight change.  HENT:   Negative for hearing loss, lump/mass and trouble swallowing.   Eyes:  Negative for eye problems and icterus.  Respiratory:  Negative for chest tightness, cough and shortness of breath.   Cardiovascular:  Negative for chest pain, leg swelling and palpitations.  Gastrointestinal:  Negative for abdominal distention, abdominal pain, constipation, diarrhea, nausea and vomiting.  Endocrine: Negative for hot flashes.  Genitourinary:  Negative for difficulty urinating.   Musculoskeletal:  Negative for arthralgias.  Skin:  Negative for itching and rash.  Neurological:  Negative for dizziness, extremity weakness, headaches and numbness.  Hematological:  Negative for  adenopathy. Does not bruise/bleed easily.  Psychiatric/Behavioral:  Negative for depression. The patient is not nervous/anxious.       PHYSICAL EXAMINATION    Vitals:   05/30/24 0905  BP: 108/73  Pulse: 73  Resp: 16  Temp: 97.9 F (36.6 C)  SpO2: 93%    She appears well today. No acute distress Bilateral breasts inspected and palpated. No palpable mass. No regional adenopathy CTA bilaterally RRR No LE edema    ASSESSMENT and THERAPY PLAN:   Assessment and Plan Assessment & Plan Breast cancer, status post treatment, on tamoxifen , with persistent left breast pain and nipple discharge Persistent left breast pain and nipple discharge post-treatment. Recent imaging showed no lesions. Pain likely due to post-radiation sensitivity. Tamoxifen  well-tolerated. - Continue tamoxifen . - Scheduled regular bilateral mammogram for December 11th. - Provided pamphlet on [employer] reveal for MRD testing, she is interested. She will call [employer]  Leukopenia White blood count low but stable.  - NO intervention needed, we will continue to monitor. - Continue to monitor white blood cell count.  Iron deficiency, on iron supplementation Hemoglobin and platelets normal. Ferritin pending We will keep her posted of the results when available.  FU in 6 months.  All questions were answered. The patient knows to call the clinic with any problems, questions or concerns. We can certainly see the patient much sooner if necessary.  Total encounter time:30 minutes*in face-to-face visit time, chart review, lab review, care coordination, order entry, and documentation of the encounter time.   *Total Encounter Time as defined by the Centers for Medicare and Medicaid Services includes, in addition to the face-to-face time of a patient visit (documented in the note above) non-face-to-face time: obtaining and reviewing outside history, ordering and reviewing medications, tests or procedures, care  coordination (communications with other health care professionals or caregivers) and documentation in the medical record.

## 2024-05-30 NOTE — Assessment & Plan Note (Signed)
 Breast cancer.  Currently on tamoxifen  tolerating well - Continue on tamoxifen  and follow-up with Dr. Arno Bibles as scheduled  CBC abnormalities.  Her white blood cells, hemoglobin, and platelets have all been mildly decreased.  Her neutrophils have normalized at this lab check. - Questionably secondary to iron and B12 deficiency.  Hemoglobin almost normalized.  Will recheck in 3 months.  Iron deficiency Continues on iron supplements.  Ferritin improved -Continue iron supplementation -Recheck labs again in 3 months  Vitamin B12 supplementation - Her most recent level was 6070.  I recommended she change from 5000 mcg to 1000 mcg daily - Will recheck labs again in 3 months

## 2024-07-24 ENCOUNTER — Encounter: Payer: Self-pay | Admitting: Gastroenterology

## 2024-08-19 ENCOUNTER — Ambulatory Visit: Payer: Self-pay | Admitting: Gastroenterology

## 2024-11-27 ENCOUNTER — Inpatient Hospital Stay: Admitting: Hematology and Oncology

## 2024-11-27 ENCOUNTER — Inpatient Hospital Stay
